# Patient Record
Sex: Male | Born: 1935
Health system: Southern US, Community
[De-identification: ages and names within clinical notes are randomized; demographics above are authoritative.]

## PROBLEM LIST (undated history)

## (undated) DIAGNOSIS — K219 Gastro-esophageal reflux disease without esophagitis: Secondary | ICD-10-CM

## (undated) DIAGNOSIS — I509 Heart failure, unspecified: Secondary | ICD-10-CM

## (undated) DIAGNOSIS — I70219 Atherosclerosis of native arteries of extremities with intermittent claudication, unspecified extremity: Secondary | ICD-10-CM

## (undated) DIAGNOSIS — I236 Thrombosis of atrium, auricular appendage, and ventricle as current complications following acute myocardial infarction: Secondary | ICD-10-CM

## (undated) DIAGNOSIS — I10 Essential (primary) hypertension: Secondary | ICD-10-CM

## (undated) DIAGNOSIS — R0602 Shortness of breath: Secondary | ICD-10-CM

## (undated) DIAGNOSIS — Z95810 Presence of automatic (implantable) cardiac defibrillator: Secondary | ICD-10-CM

## (undated) DIAGNOSIS — I219 Acute myocardial infarction, unspecified: Secondary | ICD-10-CM

## (undated) DIAGNOSIS — J189 Pneumonia, unspecified organism: Secondary | ICD-10-CM

## (undated) DIAGNOSIS — I2510 Atherosclerotic heart disease of native coronary artery without angina pectoris: Secondary | ICD-10-CM

## (undated) DIAGNOSIS — Z5189 Encounter for other specified aftercare: Secondary | ICD-10-CM

## (undated) DIAGNOSIS — N189 Chronic kidney disease, unspecified: Secondary | ICD-10-CM

## (undated) DIAGNOSIS — IMO0001 Reserved for inherently not codable concepts without codable children: Secondary | ICD-10-CM

## (undated) DIAGNOSIS — J449 Chronic obstructive pulmonary disease, unspecified: Secondary | ICD-10-CM

## (undated) DIAGNOSIS — C801 Malignant (primary) neoplasm, unspecified: Secondary | ICD-10-CM

## (undated) DIAGNOSIS — M1990 Unspecified osteoarthritis, unspecified site: Secondary | ICD-10-CM

## (undated) DIAGNOSIS — Z7901 Long term (current) use of anticoagulants: Secondary | ICD-10-CM

## (undated) DIAGNOSIS — C61 Malignant neoplasm of prostate: Secondary | ICD-10-CM

## (undated) DIAGNOSIS — I209 Angina pectoris, unspecified: Secondary | ICD-10-CM

## (undated) DIAGNOSIS — D649 Anemia, unspecified: Secondary | ICD-10-CM

## (undated) HISTORY — PX: CORONARY ANGIOPLASTY: SHX604

## (undated) HISTORY — PX: CARDIAC CATHETERIZATION: SHX172

## (undated) HISTORY — DX: Acute myocardial infarction, unspecified: I21.9

## (undated) HISTORY — DX: Atherosclerosis of native arteries of extremities with intermittent claudication, unspecified extremity: I70.219

---

## 1998-12-26 ENCOUNTER — Encounter: Payer: Self-pay | Admitting: Surgery

## 1998-12-28 ENCOUNTER — Ambulatory Visit (HOSPITAL_COMMUNITY): Admission: RE | Admit: 1998-12-28 | Discharge: 1998-12-28 | Payer: Self-pay | Admitting: Surgery

## 2000-07-09 ENCOUNTER — Encounter: Payer: Self-pay | Admitting: Surgery

## 2000-07-09 ENCOUNTER — Encounter: Admission: RE | Admit: 2000-07-09 | Discharge: 2000-07-09 | Payer: Self-pay | Admitting: Surgery

## 2000-12-11 ENCOUNTER — Encounter (INDEPENDENT_AMBULATORY_CARE_PROVIDER_SITE_OTHER): Payer: Self-pay | Admitting: Specialist

## 2000-12-11 ENCOUNTER — Ambulatory Visit (HOSPITAL_COMMUNITY): Admission: RE | Admit: 2000-12-11 | Discharge: 2000-12-11 | Payer: Self-pay | Admitting: Gastroenterology

## 2001-10-22 ENCOUNTER — Ambulatory Visit (HOSPITAL_BASED_OUTPATIENT_CLINIC_OR_DEPARTMENT_OTHER): Admission: RE | Admit: 2001-10-22 | Discharge: 2001-10-22 | Payer: Self-pay | Admitting: Urology

## 2002-09-06 ENCOUNTER — Ambulatory Visit (HOSPITAL_COMMUNITY): Admission: RE | Admit: 2002-09-06 | Discharge: 2002-09-06 | Payer: Self-pay | Admitting: Family Medicine

## 2002-09-06 ENCOUNTER — Encounter: Payer: Self-pay | Admitting: Family Medicine

## 2003-02-16 ENCOUNTER — Encounter: Payer: Self-pay | Admitting: Surgery

## 2003-02-16 ENCOUNTER — Ambulatory Visit (HOSPITAL_COMMUNITY): Admission: RE | Admit: 2003-02-16 | Discharge: 2003-02-16 | Payer: Self-pay | Admitting: Surgery

## 2003-12-06 ENCOUNTER — Ambulatory Visit (HOSPITAL_COMMUNITY): Admission: RE | Admit: 2003-12-06 | Discharge: 2003-12-06 | Payer: Self-pay | Admitting: Family Medicine

## 2003-12-14 ENCOUNTER — Ambulatory Visit (HOSPITAL_COMMUNITY): Admission: RE | Admit: 2003-12-14 | Discharge: 2003-12-14 | Payer: Self-pay | Admitting: Family Medicine

## 2004-08-10 ENCOUNTER — Emergency Department (HOSPITAL_COMMUNITY): Admission: EM | Admit: 2004-08-10 | Discharge: 2004-08-10 | Payer: Self-pay | Admitting: Emergency Medicine

## 2004-08-15 ENCOUNTER — Ambulatory Visit (HOSPITAL_COMMUNITY): Admission: RE | Admit: 2004-08-15 | Discharge: 2004-08-15 | Payer: Self-pay | Admitting: Urology

## 2004-10-03 ENCOUNTER — Ambulatory Visit (HOSPITAL_COMMUNITY): Admission: RE | Admit: 2004-10-03 | Discharge: 2004-10-03 | Payer: Self-pay | Admitting: Urology

## 2005-07-25 ENCOUNTER — Ambulatory Visit: Payer: Self-pay | Admitting: Cardiology

## 2005-07-25 ENCOUNTER — Inpatient Hospital Stay (HOSPITAL_COMMUNITY): Admission: EM | Admit: 2005-07-25 | Discharge: 2005-07-29 | Payer: Self-pay | Admitting: *Deleted

## 2005-07-28 ENCOUNTER — Ambulatory Visit: Payer: Self-pay | Admitting: Cardiology

## 2005-07-28 ENCOUNTER — Encounter: Payer: Self-pay | Admitting: Cardiology

## 2005-08-01 ENCOUNTER — Ambulatory Visit: Payer: Self-pay | Admitting: Cardiology

## 2005-08-08 ENCOUNTER — Ambulatory Visit: Payer: Self-pay | Admitting: Cardiology

## 2005-09-01 ENCOUNTER — Ambulatory Visit: Payer: Self-pay | Admitting: Cardiology

## 2005-10-16 ENCOUNTER — Ambulatory Visit (HOSPITAL_COMMUNITY): Admission: RE | Admit: 2005-10-16 | Discharge: 2005-10-16 | Payer: Self-pay | Admitting: Family Medicine

## 2005-12-18 ENCOUNTER — Ambulatory Visit: Payer: Self-pay | Admitting: Cardiology

## 2006-07-21 ENCOUNTER — Encounter: Payer: Self-pay | Admitting: Cardiology

## 2006-07-21 ENCOUNTER — Ambulatory Visit: Payer: Self-pay | Admitting: Cardiology

## 2006-08-06 ENCOUNTER — Ambulatory Visit: Payer: Self-pay

## 2006-08-13 ENCOUNTER — Inpatient Hospital Stay (HOSPITAL_BASED_OUTPATIENT_CLINIC_OR_DEPARTMENT_OTHER): Admission: RE | Admit: 2006-08-13 | Discharge: 2006-08-13 | Payer: Self-pay | Admitting: Cardiology

## 2006-08-31 ENCOUNTER — Ambulatory Visit: Payer: Self-pay | Admitting: Cardiology

## 2007-11-29 ENCOUNTER — Ambulatory Visit: Payer: Self-pay | Admitting: Cardiology

## 2009-04-23 ENCOUNTER — Encounter: Payer: Self-pay | Admitting: Cardiology

## 2009-04-23 DIAGNOSIS — R0602 Shortness of breath: Secondary | ICD-10-CM

## 2009-04-23 DIAGNOSIS — I10 Essential (primary) hypertension: Secondary | ICD-10-CM | POA: Insufficient documentation

## 2009-04-23 DIAGNOSIS — E785 Hyperlipidemia, unspecified: Secondary | ICD-10-CM | POA: Insufficient documentation

## 2009-04-23 DIAGNOSIS — I2510 Atherosclerotic heart disease of native coronary artery without angina pectoris: Secondary | ICD-10-CM | POA: Insufficient documentation

## 2009-04-23 HISTORY — DX: Atherosclerotic heart disease of native coronary artery without angina pectoris: I25.10

## 2009-04-24 ENCOUNTER — Ambulatory Visit: Payer: Self-pay | Admitting: Cardiology

## 2009-05-07 ENCOUNTER — Ambulatory Visit: Payer: Self-pay | Admitting: Cardiology

## 2009-05-07 ENCOUNTER — Encounter: Payer: Self-pay | Admitting: Cardiology

## 2009-05-07 ENCOUNTER — Ambulatory Visit: Payer: Self-pay

## 2009-05-08 ENCOUNTER — Ambulatory Visit: Payer: Self-pay | Admitting: Cardiology

## 2009-05-08 DIAGNOSIS — I2589 Other forms of chronic ischemic heart disease: Secondary | ICD-10-CM

## 2009-05-10 LAB — CONVERTED CEMR LAB
BUN: 21 mg/dL (ref 6–23)
CO2: 29 meq/L (ref 19–32)
Calcium: 8.4 mg/dL (ref 8.4–10.5)
Chloride: 109 meq/L (ref 96–112)
Creatinine, Ser: 1.6 mg/dL — ABNORMAL HIGH (ref 0.4–1.5)
GFR calc non Af Amer: 45.37 mL/min (ref >60)
Glucose, Bld: 105 mg/dL — ABNORMAL HIGH (ref 70–99)
Potassium: 4.4 meq/L (ref 3.5–5.1)
Sodium: 143 meq/L (ref 135–145)

## 2009-05-15 ENCOUNTER — Telehealth: Payer: Self-pay | Admitting: Cardiology

## 2009-05-18 ENCOUNTER — Telehealth: Payer: Self-pay | Admitting: Cardiology

## 2010-09-22 LAB — CONVERTED CEMR LAB
BUN: 25 mg/dL — ABNORMAL HIGH (ref 6–23)
CO2: 27 meq/L (ref 19–32)
Calcium: 8.8 mg/dL (ref 8.4–10.5)
Chloride: 108 meq/L (ref 96–112)
Creatinine, Ser: 1.5 mg/dL (ref 0.4–1.5)
GFR calc non Af Amer: 48.88 mL/min (ref >60)
Glucose, Bld: 104 mg/dL — ABNORMAL HIGH (ref 70–99)
Potassium: 4.9 meq/L (ref 3.5–5.1)
Sodium: 140 meq/L (ref 135–145)

## 2011-01-07 NOTE — Assessment & Plan Note (Signed)
[facility] OFFICE NOTE   INNIS, CAMIRE                      MRN:          [id number]  DATE:11/29/2007                            DOB:          June 06, 1935    [name] is here for cardiology follow-up.  He is doing relatively  well.  Unfortunately, he is bothered significantly by shortness of  breath.  He has been evaluated at the [facility].  He will be obtaining  some records for me.  It is my understanding that his cardiac status was  checked and now he is having very careful complete pulmonary evaluation.  I suspect that his pulmonary status is a major issue. He is not having  any chest pain.  He has not had syncope or presyncope. We know that he  has coronary disease.  He had an anterior MI in the past and was treated  with a kissing balloon intervention.  His LV did improve.  Because it  was a bifurcation lesion, it would be optimal to have him on Plavix  indefinitely.   PAST MEDICAL HISTORY:   ALLERGIES:  DEMEROL.   MEDICATIONS:  1. Plavix 75.  2. Metoprolol 50 b.i.d.  3. Aspirin.  4. Cyclobenzaprine.  5. Albuterol,  6. Celebrex.  7. Simvastatin.  8. Lisinopril.  9. Singulair.   OTHER MEDICAL PROBLEMS:  See the list below.   REVIEW OF SYSTEMS:  Other than his shortness of breath, his review of  systems is negative.   PHYSICAL EXAM:  His weight is 197 pounds on our scale which is increased  by 11 pounds since last year.  Because he has had shortness of breath,  he has been less active and he is eating more and he knows that he needs  to lose weight. Blood pressure is 118/84 with a pulse of 74.  The  patient is oriented to person, time and place.  Affect is normal.  HEENT:  Reveals no xanthelasma.  He has normal extraocular motion.  There are no carotid bruits.  There is no jugular venous distention.  LUNGS:  Reveal decreased breath sounds.  There is no respiratory  distress at  rest.  CARDIAC:  Reveals an S1 with an S2.  There are no clicks or significant  murmurs.  ABDOMEN:  Protuberant but soft.  He has no significant peripheral edema.   No labs were done today.   PROBLEMS:  1. Hypertension treated.  2. History of prostate cancer with radioactive seeds approximately 10      years ago.  3. History of nephrolithiasis.  4. Urinary tract infections over time.  5. Arthritis.  6. Status post anterior myocardial infarction.  This occurred in      December 2006. He received stenting of the LAD and PTCA of the      diagonal branch with a kissing balloon technique.  7. Significant pulmonary disease that is being evaluated further at      this time.  8. Lipid abnormalities on medication.   I suspect that his shortness of breath currently is more pulmonary  than  cardiac.  The patient had a bifurcation lesion and I believe it will be  optimal for him to be on Plavix indefinitely unless there is a  contraindication.     Carlena Bjornstad, MD, [facility]  Electronically Signed    JDK/MedQ  DD: 11/29/2007  DT: 11/29/2007  Job #: [id number]   cc:   [facility] Dr.  Gwynneth Albright

## 2011-01-10 NOTE — Op Note (Signed)
[facility]  Patient:    [name], [name] Visit Number: [id number] MRN: [id number]          Service Type: NES Location: [place] Attending Physician:  Paul Dykes Dictated by:   Duane Lope. Parks Neptune, M.D. Proc. Date: 10/22/01 Admit Date:  10/22/2001                             Operative Report  DIAGNOSIS:  Urethral stricture disease.  OPERATIVE PROCEDURE:  Optical urethrotomy and cystourethroscopy.  SURGEON:  Duane Lope. Parks Neptune, M.D.  ANESTHESIA:  General laryngeal airway.  ESTIMATED BLOOD LOSS:  Negligible.  TUBES:  20-French Foley.  COMPLICATIONS:  None.  INDICATION FOR PROCEDURE:  [name] is a very nice 76 year old white male who is status post seed implantation in 1999. His PSAs have been unmeasurable but he has developed some variable flow, terminal hematuria, and straining. Attempt at cystourethroscopy in the office revealed a deep bulbar urethral stricture and he had a history of prior stricture disease. He understands risks, benefits, and alternatives. He would like to proceed with the above procedure.  Of note, he had an ultrasound of the kidneys in the office which revealed kidney stones but no evidence of masses or hydronephrosis.  PROCEDURE IN DETAIL:  The patient was placed in supine position and after proper laryngeal airway anesthesia, was placed in the dorsal lithotomy position and prepped and draped with Betadine in sterile fashion. Cystourethroscopy is performed with an Olympus optical urethrotome. He was noted to have a deep bulbar urethral stricture which was approximately 2 cm in length, most of it was wide caliber but it was quite tight just distal to the external sphincter and utilizing the optical urethrotome, it was incised in the 12 oclock position and opened to viable tissues. The scope was then passed into the bladder. Cystourethroscopy was performed utilizing the 12 and 70 degree lenses. The bladder  was carefully inspected. It was quite smooth walled without significant trabeculation. Efflux of clear urine was noted from the normally placed ureteral orifices bilaterally. Prostatic fossa had slight hypertrophy but really was not obstructed and the area of optical urethrotomy appeared to be dry and widely patent. The optical urethrotome was visually removed. A 20-French Foley catheter was inserted and the urine was drained clear and the patient was taken to the recovery room in stable condition. Dictated by:   Duane Lope. Parks Neptune, M.D. Attending Physician:  Paul Dykes DD:  10/22/01 TD:  10/23/01 Job: [id number]

## 2011-01-10 NOTE — Assessment & Plan Note (Signed)
[facility] OFFICE NOTE   [name], [name]                      MRN:          [id number]  DATE:08/31/2006                            DOB:          Jan 21, 1935    [name] is doing well.  I had seen him over time and then most  recently he had a followup catheterization as part of the followup of  the HORIZONS trial.  That study went very well.  It was done by Dr.  Lia Foyer.  He has had improvement in his left ventricular wall function  with his ejection fraction being now in the 45-50% range.  He had no  evidence of significant restenosis at the stent site.  Originally, he  had had an intervention with stenting only of the parent vessel in a  bifurcation lesion.  This has worked well for him.  We will plan to keep  him on Plavix for a prolonged period of time, if not indefinitely.   He is not having any significant chest pain.  He really does appear to  be motivated.   PAST MEDICAL HISTORY:  Allergies:  DEMEROL.   Medications:  1. Plavix 75.  2. Metoprolol 50 b.i.d.  3. Aspirin.  4. Cyclobenzaprine.  5. Albuterol.  6. Celebrex.  7. Simvastatin 80.  8. Lisinopril.  9. Singulair.   Other medical problems:  See the list on the note of July 21, 2006.   REVIEW OF SYSTEMS:  He feels fine and his review of systems is negative.   PHYSICAL EXAMINATION:  VITAL SIGNS:  Today he weighs 186 pounds.  Blood  pressure is 114/77 with a pulse of 73.  GENERAL:  The patient is oriented to person, time and place.  Affect is  normal.  HEENT:  There is no xanthelasma.  He has normal extraocular motion.  He  has no carotid bruits.  There is no jugular venous distention.  LUNGS:  Clear.  Respiratory effort is not labored.  CARDIAC:  Reveals an S1 with an S2.  There are no clicks or significant  murmurs.  ABDOMEN:  Soft.  He has no masses or bruits.  EXTREMITIES:  There is no peripheral edema.  His catheterization  site is  nicely healed by report.   PROBLEMS:  1. Hypertension, treated.  2. History of prostate cancer with radioactive seeds approximately 8-9      years ago.  3. Nephrolithiasis.  4. History of urinary tract infections over time.  5. Arthritis.  6. Status post anterior myocardial infarction, treated rapidly with a      stent in a kissing-balloon position to the native vessel.  His left      ventricular function has definitely improved.  7. History of cellulitis in his arm during the hospitalization that      resolved.  8. Significant pulmonary disease, being treated.  9. Lipid abnormalities, on medication.   He is doing well.  I will see him back in 1 year.     Mason Bjornstad, MD, [facility]  Electronically Signed    JDK/MedQ  DD: 08/31/2006  DT: 08/31/2006  Job #: [id number]   cc:   [name]. Mason Orleans, DO

## 2011-01-10 NOTE — H&P (Signed)
[name], [name] NO.:  [id number]   MEDICAL RECORD NO.:  [id number]          PATIENT TYPE:  INP   LOCATION:  2855                         FACILITY:  [facility]   PHYSICIAN:  [name], M.D.     DATE OF BIRTH:  1935-08-18   DATE OF ADMISSION:  07/25/2005  DATE OF DISCHARGE:                                HISTORY & PHYSICAL   REASON FOR ADMISSION:  [name] is a 76 year old male, with no prior  cardiac history, who presents to the emergency room via EMS, with an acute  myocardial infarction.   HISTORY OF PRESENT ILLNESS:  The patient felt bad this morning and noted  the onset of pain at approximately 9:30 a.m.  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  After coming home  later this morning, he called his son at around 11:30 a.m. complaining of  severe, sharp mid-sternal chest pain with profuse associated diaphoresis and  radiation down his left arm.  He also reported having difficulty taking in  air and had some mild associated nausea.  The family contacted EMS and he  presented to the emergency room with marked ST elevation in the precordial  leads, as well as ST elevation in lead I and aVL.   The patient was treated with four baby aspirin, intravenous heparin bolus  and 8 mg of morphine.  Arrangements were made to take the patient directly  to the cardiac catheterization laboratory for emergent coronary angiography  and intervention.   ALLERGIES:  CIPROFLOXACIN.   MEDICATIONS PRIOR TO ADMISSION:  1.  Felodipine 10 mg daily.  2.  Singulair one tab daily.  3.  Celebrex p.r.n.   PAST MEDICAL HISTORY:  1.  Hypertension.  2.  History of prostate cancer, status post radioactive seed treatment      approximately eight years ago (at [facility]), recurrent      nephrolithiasis with associated urinary tract infections and arthritis.   SOCIAL HISTORY:  The patient lives in [place] with his wife.  He  works  part-time as a Dealer at [employer].  He has been smoking cigarettes  off and on for approximately seven years.  He drinks alcohol only on rare  occasion.   FAMILY HISTORY:  Mother deceased at age 19, from a fatal myocardial  infarction (first one).  Father deceased at age 41, with no known history of  coronary artery disease.   REVIEW OF SYSTEMS:  The patient reportedly had no recent complaints of chest  discomfort either at rest or with exertion, or any complaints of any recent  development of exertional dyspnea, orthopnea or paroxysmal nocturnal  dyspnea.  His wife, however, noted that he had been wheezing these past few  days.  There was no recent evidence of any overt upper or lower GI bleeding.  He has occasional symptoms of heartburn.  The remaining symptoms are  negative.   PHYSICAL EXAMINATION:  VITAL SIGNS:  Blood pressure 131/78, pulse 50 and  regular on admission, respirations 14, saturation 94% on room air.  GENERAL:  A 76 year old male, lying supine, in no apparent distress, in no  respiratory distress.  HEENT:  Normocephalic and atraumatic.  NECK:  Bilateral carotid pulses without bruits.  No jugular venous  distention.  LUNGS:  Limited exam, but without any obvious crackles or wheezes in the  supine position.  HEART:  A regular rate and rhythm (S1 and S2), no significant murmurs.  No  gallops.  ABDOMEN:  Soft, nontender.  EXTREMITIES:  Palpable distal pulses with no significant edema.  NEUROLOGIC:  No focal deficits.   Electrocardiogram:  Sinus bradycardia at 53 b.p.m. with marked ST elevation  in lead V1-V5, a 1-2 mm ST elevation in lead I and aVL, with ST flattening  in the inferior leads.   LABORATORY DATA:  Pending.   IMPRESSION:  1.  Acute anterolateral myocardial infarction.  2.  History of hypertension.  3.  Longstanding tobacco.  4.  History of prostate cancer      1.  Status post radioactive seed treatment.   PLAN:  Proceed directly  to the cardiac catheterization laboratory for an  emergent coronary angiography and percutaneous intervention.      [name], P.A. [facility]    ______________________________  [name], M.D.    GS/MEDQ  D:  07/25/2005  T:  07/25/2005  Job:  [id number]   cc:   [name], M.D.  Fax: [phone]

## 2011-01-10 NOTE — Cardiovascular Report (Signed)
[name], [name]               ACCOUNT NO.:  [id number]   MEDICAL RECORD NO.:  [id number]          PATIENT TYPE:  OIB   LOCATION:  1962                         FACILITY:  [facility]   PHYSICIAN:  Loretha Brasil. Lia Foyer, MD, FACCDATE OF BIRTH:  11-24-1934   DATE OF PROCEDURE:  08/13/2006  DATE OF DISCHARGE:                            CARDIAC CATHETERIZATION   INDICATIONS:  [name] is a delightful 76 year old gentleman followed  by Dr. Ron [name].  He previously presented with an anterior wall myocardial  infarction and underwent stenting in the HORIZONS trial.  This required  stenting of the parent vessel with kissing balloon angioplasty for  rescue of the diagonal.  He had a nice angiographic result at the  completion of the procedure.  He clinically has done well.  This  represents a 1-year follow-up study.   DESCRIPTION OF PROCEDURE:  The patient was brought to the  catheterization laboratory and prepped and draped in the usual fashion  after informed consent.  Through an anterior puncture, the right femoral  artery was entered.  A femoral angiogram was obtained for potential  closure, and there is a small side branch which comes off anteriorly  right at the sheath insertion site.  Plans for Angio-Seal closure were  then abandoned.  The patient was heparinized with 50 units/kg.  ACT was  checked.  Views of the right coronary artery were obtained.  We used a  JL-3.5 guiding catheter subsequently and multiple views of the target  artery were also obtained.  This was also done after intracoronary  nitroglycerin administration.  With an adequate ACT, the lesion was then  wired with a Prowater wire.  Intravascular ultrasound was then  attempted.  Despite multiple breaths, we were unable to pass the  intravascular ultrasound catheter past the proximal portion of the  stent.  With this, I elected to change the wire and the ultrasound  catheter was removed.  The Prowater wire was exchanged for a  Luge wire,  which was manipulated again down the vessel in multiple views.  Following this, we attempted again to cross with the intravascular  ultrasound with various breathing maneuvers and again were unable to  pass the proximal portion of the stent.  The wire was then brought back,  placed back down the vessel, and an attempt was made to cross down into  the distal vessel.  The ultrasound catheter would not pass the proximal  portion of the stent, and therefore both the wire and the ultrasound  catheter were removed.  Follow-up angiograms were obtained.  There was  an excellent angiographic appearance.  No attempt at femoral artery  closure was tried due to what appeared to be a small anterior side  branch at the site of entry.  He was taken to the holding area in  satisfactory clinical condition for plans for sheath removal when the  ACT was 150.   HEMODYNAMIC DATA.:  1. Central aortic pressure 125/74, mean 96.  2. LV 118/70.   ANGIOGRAPHIC DATA.:  1. Ventriculography was done in the RAO projection.  Estimate of the  ejection fraction would be in the 45-50% range.  Importantly,      regional wall motion in the anterolateral and apical segments was      significantly improved from the infarct study.  The inferior base      and anterior base were not as hyperdynamic as on the acute study,      as would be expected.  2. The right coronary artery is a moderately large-caliber vessel.      There is not significant calcification noted.  There is a      shepherd's crook takeoff and in the very first shepherd's crook is      a plaque of about 30% that actually appears slightly improved from      the acute study.  There is an area of segmental plaquing in the mid      segment of the right coronary artery of about 30%, and there is      also some mild tapered narrowing involving the ostium of the      posterior descending branch.  There are three small posterolateral      branches,  which all appear to be free of critical disease.  3. The left main is a large-caliber vessel free of critical disease.  4. The LAD courses to the apex.  The stented area in the mid vessel      demonstrates no evidence of any significant restenosis and is      widely patent.  Flow into the distal LAD is excellent.  The distal      LAD is about a 2.25 mm artery supplying a couple of septals.  There      is a small diagonal branch which comes out proximal to the stent      placement site,and then a large diagonal that comes off in the      midportion of the stented site.  The diagonal itself appears to be      widely patent without significant renarrowing.  There is perhaps      30% narrowing.  The diagonal is moderately large with three small      side branches.  5. The circumflex provides an insignificant ramus vessel and then a      large marginal which bifurcates over the distal lateral wall.      There is minor luminal irregularity but no significant high-grade      focal stenosis.   CONCLUSIONS:  1. Improved regional wall motion in the anterolateral segment.  2. Continued patency of the right coronary artery.  3. No evidence of significant restenosis at the stent site, widely      patent into the distal left anterior descending artery.   DISPOSITION:  The patient is stable.  We will arrange follow-up with Dr.  Daryel [name].  Importantly, the patient has had the stent placed in the  setting of acute myocardial infarction as part of the HORIZONS clinical  trial.  He also had kissing balloon intervention with stenting only of  the parent vessel.  Given the acute nature in the bifurcation lesion, my  inclination at the present time would be to continue to follow the  patient on Plavix with close follow-up for signs of bleeding.      Loretha Brasil. Lia Foyer, MD, [facility]  Electronically Signed    TDS/MEDQ  D:  08/13/2006  T:  08/13/2006  Job:  [id number]   cc:   Mason Borg, MD  [name].  Mason Parker, MD, [facility]

## 2011-01-10 NOTE — Discharge Summary (Signed)
[name], [name] NO.:  [id number]   MEDICAL RECORD NO.:  [id number]          PATIENT TYPE:  INP   LOCATION:  [id number]                         FACILITY:  [facility]   PHYSICIAN:  Dola Argyle, M.D.     DATE OF BIRTH:  [birth year]   DATE OF ADMISSION:  07/25/2005  DATE OF DISCHARGE:  07/29/2005                           DISCHARGE SUMMARY - REFERRING   HISTORY OF PRESENT ILLNESS:  [name] is a 76 year old male who presents  to the emergency room via EMS secondary to myocardial infarction.  The  patient stated that on the morning of admission he developed chest  discomfort approximately at 9:30 a.m.  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  However, he called his son around  11:30 and continued to complain of sharp severe midsternal chest discomfort  associated with diaphoresis and radiation to his left arm, shortness of  breath and mild nausea.  EMS was contacted and he was transported to [facility] emergency room where EKG's showed ST segment elevation in leads one and  aVL.   PAST MEDICAL HISTORY:  1.  Hypertension.  2.  Prostate cancer with radioactive seed treatment approximately eight      years ago.  3.  Recurrent nephrolithiasis.  4.  Repeated urinary tract infections.  5.  Arthritis.  6.  Tobacco use for approximately 47 years.   LABORATORY DATA:  Chest x-ray on July 26, 2005 shows no acute pulmonary  processes, stable right lung granuloma.  EKG's on admission showed ST  segment elevations V1 through V6 with ST segment depression in leads 3.  He  also had a ST segment elevation in one and aVL.  Subsequent EKG's showed  sinus rhythm, occasional PVCs, evolving anterior myocardial infarction.   Admission H&H was 14.7 and 41.9, normal indices, platelets 270, WBC 12.6.  Subsequent hematologies were unremarkable.  Admission PTT was 24, PT 12.5,  sodium 136, potassium 49, BUN 16, creatinine 1.2, glucose 118.  LFT's were  unremarkable except for his AST was slightly elevated at 44.  Subsequent  chemistries did show a potassium of 3.4 on the second, however, on the third  his potassium was 3.9.  On the third his BUN and creatinine was 10 and 1.2.  Hemoglobin A1C was within normal limits at 5.5.  Initial CK-MB was 185 and  10.1 with a relative index 5.5, troponin 0.39.  The second CK peaked at 472  with MB of 406.7, relative index of 10 and troponin of greater than 100.  Subsequent enzymes and troponin's were declining.  BNP on the second was  221.4.  Fasting lipids on the second showed a total cholesterol of 194,  triglycerides 126, HDL 33, LDL 136.  TSH was 2.335.  C-reactive protein was  less than 0.01.   HOSPITAL COURSE:  [name] proceeded emergently to the catheterization  lab.  This was performed by Dr. Lia Foyer.  According to Dr. Maren Beach note  arrival to inflation was 17 minutes.  He underwent PTCA and stenting of the  LAD with a study stent and  angioplasty of the diagonal with a kissing  balloon.  EF was 40-45% with a posterior wall MI.  Post catheterization at  sheath removal catherization site was intact.  EKG's continued to show ST  segment elevation.  He continued to have some nausea overnight and with his  elevated LVEDP on cath he received one dose of Lasix.  Overnight he remained  afebrile without any further episodes of chest discomfort and his nausea had  resolved.  Cardiac rehab and tobacco cessation consults were performed.  Medications were adjusted.  Echocardiogram was performed on the fourth and  this revealed an EF of 35-45%.  Akinesis of the anterior wall and periapical  wall.  There was an aneurysmal deformity in the mid distal inferior wall,  mild aortic valve calcifications and the possibility of cardiac embolism  could not be ruled out by this study.  On July 29, 2005 the patient was  ambulating without difficulty.  Dr. Radene Ou reviewed the echocardiogram.  Discharge planning  was begun.  Prior to discharge Dr. Radene Ou called myself  with specific instructions in regards to his discharge i.e., he should be  discharged home without Combivent and to continue a beta selective agent  given his pulmonary status.   DISCHARGE DIAGNOSES:  1.  Acute anterior lateral myocardial infarction status post stenting of the      LAD and angioplasty of the diagonal one as previously described.  2.  Hyperlipidemia with initiation of Lipitor.  3.  Chronic obstructive pulmonary disease with tobacco use.  4.  Hypokalemia.  5.  History of hypertension.  6.  Prostate cancer with recent seed placement.   DISPOSITION:  [name] is discharged home.  He is asked to maintain a low  salt, low fat  cholesterol diet.  His activities in regards to work,  driving, lifting, sexual activity or heavy exertion are restricted for two  weeks.  Wound care per supplemental discharge sheet.  He was asked to avoid  tobacco products and to discontinue smoking.  To bring all medications to  all appointments.   DISCHARGE MEDICATIONS:  He was given permission to continue his Singular and  his Celebrex.  His new medications include Plavix 75 mg daily, aspirin 325  mg daily, nitroglycerin 0.4 mg p.r.n., Lipitor 80 mg q.h.s., Lopressor 25 mg  b.i.d., Keflex 250 mg t.i.d. for five days (IV infiltration) and Altace 2.5  mg daily.   FOLLOW UP:  He will followup with Dr. Radene Ou in the [facility] office on August 01, 2005 at 2 p.m.  At that time he will reassess his pulmonary status.  He will  also need arrangements for blood work in approximately 6-8 weeks in regards  to his fasting lipid profile and LFT's since Lipitor was initiated.      Sharyl Nimrod, P.A. [facility]    ______________________________  Dola Argyle, M.D.    EW/MEDQ  D:  07/29/2005  T:  07/29/2005  Job:  [id number]   cc:   Leonides Grills, M.D.  Fax: [phone]

## 2011-01-10 NOTE — H&P (Signed)
[place]   HORACE, ROBARDS                      MRN:          [id number]  DATE:07/21/2006                            DOB:          16-Aug-1935    This is a patient of Carlena Bjornstad, MD, [facility].  He is here for a 12-  month follow-up catheterization and Horizon study.   CHIEF COMPLAINT:  Follow-up catheterization for Horizon.   HISTORY OF PRESENT ILLNESS:  This is a very pleasant 76 year old married  white male patient of Dr. Dola Argyle, who has a history of coronary  artery disease status post anterior wall MI, treated with stenting and  kissing balloon angioplasty of the LAD in December of 2006.  Ejection  fraction at that time was 40-45%, 50% circumflex marginal, and 70%  marginal branch, 40-50% proximal RCA and PDA.  Follow-up two-dimensional  echocardiogram after his catheterization showed an ejection fraction of  35-45%.   Overall, the patient has done quite well from a cardiac standpoint.  He  denies any chest pain, palpitations, orthopnea, paroxysmal nocturnal  dyspnea.  He does have some breathing problems related to his pulmonary  disease, but did quit smoking after his MI.   ALLERGIES:  DEMEROL.   CURRENT MEDICATIONS:  1. Albuterol nebulizer three times a day p.r.n.  2. Metoprolol 50 mg b.i.d.  3. Plavix 75 mg daily.  4. Lisinopril 10 mg 1/2 b.i.d.  5. Aspirin 325 mg daily.  6. Singulair 10 mg daily.  7. NitroQuick 0.4 mg p.r.n.  8. Omeprazole 20 mg daily.  9. Simvastatin 80 mg daily.  10.Celebrex 200 mg b.i.d.  11.Hydrocodone p.r.n.  12.Carisoprodol 3.5 t.i.d.  13.Combivent inhaler p.r.n.   PAST MEDICAL HISTORY:  Significant for prostate cancer treated with  radioactive seeds 8 years ago, history of nephrolithiasis, history of  recurrent urinary tract infections over time, arthritis, 47-year-pack  history of smoking but he quit, history of cellulitis of his arm  during  hospitalization, history of COPD, hyperlipidemia.   SOCIAL HISTORY:  He is married and has five children, five  grandchildren.  He quit smoking after his MI with 47-pack-year history.   REVIEW OF SYSTEMS:  Negative for dizziness or presyncopal signs or  symptoms, dyspepsia, dysphagia, nausea and vomiting, change in bowels,  or melena.  He does have a chronic runny nose.  Otherwise review of  systems is negative.   PHYSICAL EXAMINATION:  GENERAL:  This is a very pleasant 76 year old  white male in no acute distress.  VITAL SIGNS:  Blood pressure 130/80, pulse 70, weight 183.  HEENT:  Head is normocephalic without sign of trauma.  Extraocular  movements are intact.  Pupils equal, round, and reactive to light and  accommodation.  Nasal mucosa is moist.  Throat is without erythema or  exudate.  NECK:  Without JVD or sharp bruit.  Thyroid enlargement.  LUNGS:  Decreased breath sounds, but clear anterior, posterior, and  lateral.  HEART:  Regular rate and rhythm at 70 beats per minute.  Normal S1 and  S2.  No significant murmur,  bruit, rub, thrill, or heave noted.  ABDOMEN:  Soft without organomegaly, masses, lesions, or abnormal  tenderness.  EXTREMITIES:  Without cyanosis, clubbing, or edema.  He has good distal  pulses.   EKG; normal sinus rhythm with old anterior septal infarct and T wave  inversion anteriorly, unchanged from prior tracing.   IMPRESSION:  1. 74-month Horizon study, follow-up catheterization.  2. History of coronary artery disease, status post anterior wall      myocardial infarction treated with stenting and kissing balloon      procedure of the left anterior descending in December of 2006 with      residual disease as described above.  3. Left ventricular dysfunction with ejection fraction 35-45% on      follow-up two-dimensional echocardiogram.  4. History of hypertension.  5. History of prostate cancer, status post radioactive seeds 8 years       ago.  6. History of nephrolithiasis.  7. History of recurrent urinary tract infections.  8. History of arthritis.  9. Pulmonary; chronic obstructive pulmonary disease with 47-pack-year      history, quit smoking in December of 2006.  10.Hyperlipidemia.   PLAN:  The patient will undergo cardiac catheterization as a follow-up  of the Horizon study.  Dr. Ron Parker did speak of repeating two-dimensional  echocardiogram to reassess his left ventricular  function, but with him having a catheterization, we will hold off on  this at this time and he will see Dr. Ron Parker back in January.      Ermalinda Barrios, PA-C  Electronically Signed      Marijo Conception. Verl Blalock, MD, [facility]  Electronically Signed   ML/MedQ  DD: 07/21/2006  DT: 07/21/2006  Job #: [id number]

## 2011-01-10 NOTE — Cardiovascular Report (Signed)
[name], [name]               ACCOUNT NO.:  [id number]   MEDICAL RECORD NO.:  [id number]          PATIENT TYPE:  INP   LOCATION:  2855                         FACILITY:  [facility]   PHYSICIAN:  Loretha Brasil. Lia Foyer, M.D. [facility] OF BIRTH:  1934-10-28   DATE OF PROCEDURE:  DATE OF DISCHARGE:                              CARDIAC CATHETERIZATION   Audio too short to transcribe (less than 5 seconds)      Loretha Brasil. Lia Foyer, M.D. [facility]     TDS/MEDQ  D:  07/25/2005  T:  07/25/2005  Job:  [id number]

## 2011-01-10 NOTE — Procedures (Signed)
[facility]  Patient:    [name], [name]                      MRN: [id number] Proc. Date: 12/11/00 Adm. Date:  [id number] Disc. Date: [id number] Attending:  Rafael Bihari CC:         [name], M.D.   Procedure Report  PROCEDURE:  Colonoscopy.  INDICATION FOR PROCEDURE:  Chronic diarrhea with multiple loose stools with trace heme positive stool per Dr. Lucia Gaskins.  He was also given atropine 0.2 mg IV during the procedure due to bradycardia with a pulse rate down as low as 32.  DESCRIPTION OF PROCEDURE:  The patient was placed in the left lateral decubitus position then placed on the pulse monitor with continuous low flow oxygen delivered by nasal cannula. He was sedated with 80 mg IV Demerol and 8 mg IV Versed.  The Olympus video colonoscope was inserted into the rectum and advanced to the cecum, confirmed by transillumination at McBurneys point and visualization of the ileocecal valve and appendiceal orifice. The prep was suboptimal with a significant amount of retained nontransparent liquid and some semisolid stool which could not be adequately lavage away thus I could not rule out lesions less than 1 cm in all areas. Otherwise the cecum, ascending and transverse colon appeared normal with no masses, polyps, diverticula or other mucosal abnormalities. Within the descending colon, there was seen an 8 mm sessile polyp that was fulgurated by hot biopsy. Within the sigmoid colon, there were several scattered diverticula, otherwise the sigmoid mucosa and rectal mucosa appeared normal. Biopsies taken to rule out collagenous colitis. The scope was then withdrawn and the patient returned to the recovery room in stable condition. The patient tolerated the procedure well and there were no immediate complications.  IMPRESSION: 1. Descending colon polyp. 2. Sigmoid diverticulosis.  PLAN:  Will await biopsies to rule out collagenous colitis and await  histology on the polyp. If no cause of diarrhea suggested by biopsies will try a course of Questran empirically until he follows up with me in the office. DD:  12/11/00 TD:  12/11/00 Job: [id number]

## 2011-01-10 NOTE — Letter (Signed)
October 15, 2006    [place].  [place], [place]   RE:  [name], [name]  MRN:  [id number]  /  DOB:  [birth year]   To Whom It May Concern:   [name] has coronary artery disease and is followed carefully  by me.  The patient had an intervention after a myocardial infarction  with a kissing balloon procedure.  He received a stent to the parent  vessel at the bifurcation.  This type of lesion needs to be protected  long term with Plavix therapy.   The purpose of this letter is to document that Mr. Shadrach Ghafoor clearly  and definitely needs to be on Plavix on an ongoing basis.  I strongly  [facility], as this is  definitely needed in his case.   Please do not hesitate to contact me if there are any questions.    Sincerely,      Carlena Bjornstad, MD, [facility]  Electronically Signed    JDK/MedQ  DD: 10/15/2006  DT: 10/15/2006  Job #: [id number]

## 2011-01-10 NOTE — Op Note (Signed)
NAME:  [name], [name]                         ACCOUNT NO.:  [id number]   MEDICAL RECORD NO.:  [id number]                   PATIENT TYPE:  AMB   LOCATION:  ENDO                                 FACILITY:  [facility]   PHYSICIAN:  [name], M.D.               DATE OF BIRTH:  03/13/1935   DATE OF PROCEDURE:  DATE OF DISCHARGE:                                 OPERATIVE REPORT   PREOPERATIVE DIAGNOSIS:  Change in bowel habits with worsening constipation  and questionable weight loss.   POSTOPERATIVE DIAGNOSIS:  Significant sigmoid diverticulosis with narrowing  of the sigmoid colon.   OPERATION/PROCEDURE:  Attempted colonoscopy with advancement of colonoscope  to 60 cm.   SURGEON:  [name], M.D.   ANESTHESIA:  65 mg of Demerol, 4 mg Versed.   COMPLICATIONS:  None.   DESCRIPTION OF PROCEDURE:  The patient is a 76 year old white male who has  had a change in bowel habits and a questionable weight loss though with our  scales, we have not seen much of a weight loss since November 2001.  Anyway,  he has had difficulty with bowel habits for about five years.  He has  anywhere from 3-15 bowel movements a day and for this reason is undergoing a  colonoscopy.   The patient was placed in the left lateral decubitus position with an IV in  his right arm.  He has nasal O2, blood pressure cuff, pulse oximetry and EKG  machine monitoring.  He was given 4 mg of Versed, 50 mg of Demerol at the  initiation of the procedure.  I used an adult scope at first, then switched  to a pediatric scope.  When I advanced the scope up to about 50-55 cm, I ran  into some significant diverticulosis with some relative narrowing of his  sigmoid colon.  I was unable to advance the scope beyond this point.  When  advancing this, he had pain and discomfort.  He became bradycardic,  diaphoretic, and hypotensive with a systolic pressure of about 90.  I tried  again with the adult scope.  With the pediatric  scope, we gave him  additional Demerol and none of these maneuvers seemed to make  where I can  safely advance the scope.  Really I did not have any obvious obstruction,  though I did have a significant narrowing of the sigmoid colon.  So I saw  significant sigmoid diverticulosis and no evidence of mucosal lesion.  Withdrew the scope back into the rectum which was unremarkable.  Because I  did get an  incomplete colonoscopy, really did just a fairly long sigmoidoscopy, I feel  he is best having a barium enema.  I have spoken to Dr. Lambert Keto and we  will complete an air contrast barium enema today while his bowels are  prepped and go from there.  [name], M.D.    DHN/MEDQ  D:  02/16/2003  T:  02/16/2003  Job:  [id number]   cc:   Jori Moll L. Parks Neptune, M.D.  [place]. [address] [place]  [place] [address]  Fax: [phone]

## 2011-01-10 NOTE — Cardiovascular Report (Signed)
NAMEHARDING, MCGOURTY               ACCOUNT NO.:  [id number]   MEDICAL RECORD NO.:  [id number]          PATIENT TYPE:  INP   LOCATION:  2912                         FACILITY:  [facility]   PHYSICIAN:  Loretha Brasil. Lia Foyer, M.D. [facility] OF BIRTH:  03-Jul-1935   DATE OF PROCEDURE:  07/25/2005  DATE OF DISCHARGE:                              CARDIAC CATHETERIZATION   INDICATIONS:  Mr. Hebdon is a 76 year old gentleman who presents with an  anterior wall myocardial infarction. He was seen by Dr. Ron Parker in the  emergency room and consented to the Horizons trial. He was brought to the  catheterization laboratory after evaluation in the emergency room.   PROCEDURES:  1.  Left heart catheterization.  2.  Selective coronary arteriography.  3.  Selective left ventriculography.  4.  PTCA and stenting of the left anterior descending artery with PTCA of      the diagonal branch and the kissing balloon technique.  5.  Intravascular ultrasound.   DESCRIPTION OF PROCEDURE:  The patient was brought to the catheterization  laboratory, prepped and draped in the usual fashion. Through an anterior  puncture, the right femoral artery was entered. EKG was consistent with ST  elevation in the anterior leads. Through an anterior puncture, the femoral  artery was entered. A 6-French sheath was placed. ACT was checked and found  to be 231 seconds. The patient was randomized in the Horizons trial to  bivalirudin. bivalirudin was obtained from the laboratory. We initially used  a right coronary catheter and this was followed by a JL-3.5 guide. We  engaged the left main. The LAD was occluded. Repeat ACT was approximately  300 seconds. Then 600 milligrams of oral clopidogrel was administered. A  high-torque floppy was taken down the LAD and the vessel dilated using a 2-0  15 Maverick balloon. With this reperfusion was reestablished. Arrival time  to inflation time was 17 minutes. Following this, the patient was noted to  have what appears to be a bifurcational disease. A 2.5 x 24 study stent was  placed in the left anterior descending artery and taken up to approximately  14-15 atmospheres. Following this, a traverse wire was taken down and put  into the diagonal. After this, we took a 2.75 x 20 Quantum Maverick balloon  into the stent for postdilatation. Prior post dilated, however, we put a  2.25 x 15 Maverick in the side branch because of its clear-cut compromise of  the side branch. The stent was then post dilated up to 16 atmospheres using  the noncompliant balloon. Following this, we went ahead and kissed the LAD  diagonal bifurcation using the balloons as described above. Following this,  there was marked improvement in appearance of the artery. There was a  minimal filling defect noted in only the RAO view, but not in the LAO view  and other views as well. The patient had a postprocedure intravascular  ultrasound and no evidence of intraluminal thrombus was identified by this.  There was expansion of the stent at the diagonal site which was appropriate.  The stent was well placed by ultrasound.  Dr. Olevia Perches and I then reviewed the  films and I decided against any repeat dilatation. The patient had marked  improvement in symptoms with resolution of chest pain. Following this, the  femoral sheath was sewn into place and he was taken to the holding area in  satisfactory clinical condition.   HEMODYNAMIC DATA:  1.  Central aortic pressure 147/87, mean 113.  2.  Left ventricular pressure 128/25.  3.  No gradient on pullback across aortic valve.   ANGIOGRAPHIC DATA:  1.  Ventriculography was done in the RAO projection. Mid, distal,      anterolateral wall and apex and distal inferior wall were all      hypokinetic. At the time of this dictation, ejection fraction had not      calculated but we estimated it in the range of 40-45%.  2.  The circumflex provides a marginal which bifurcates. The superior       portion of bifurcation has about 50% segmental plaque and the inferior      about 70%. This was a moderately large caliber distribution.  3.  The right coronary artery has about 40-50% proximal narrowing followed      by about 40-50% area of narrowing of proximal portion of the PDA. The      right is a relatively large vessel that includes posterolateral system      as well.  4.  The left anterior descending artery was totally occluded with TIMI 0      flow beyond the origin of the septal and diagonal. Following      reperfusion, there was evidence of a ruptured plaque at the LAD diagonal      bifurcation. The diagonal itself was reduced from about 80% to 30% with      an excellent TIMI III flow. The LAD proper was reduced from 100% to 0%      and there was an excellent angiographic appearance, although in the RAO      projection there was a small area of what appears to be perhaps tissue      perfusion. This was not seen on the ultrasound and importantly other      views of the LAD did not demonstrate a significant filling defect. As      mentioned, Dr. Olevia Perches and I reviewed this and felt that no further      dilatation was warranted at this site given the ultrasound findings.   CONCLUSIONS:  Acute anterior wall myocardial infarction treated with  stenting and kissing balloon angioplasty as described above.   DISPOSITION:  The patient will be treated with aspirin, Plavix, beta  blockade, ACE inhibition and Statin therapy. He will need close monitoring  and follow up with Dr. Ron Parker.      Loretha Brasil. Lia Foyer, M.D. [facility]  Electronically Signed     TDS/MEDQ  D:  07/25/2005  T:  07/26/2005  Job:  [id number]   cc:   Vern Claude. Prudence Davidson, M.D.  Fax: [phone]   Dola Argyle, M.D.  [address] [place] [address]   CV Laboratory   Patient's medical record   Biagio Borg, M.D. [facility]  [address]  [place] [address]

## 2011-06-21 ENCOUNTER — Inpatient Hospital Stay (HOSPITAL_COMMUNITY)
Admission: EM | Admit: 2011-06-21 | Discharge: 2011-07-04 | DRG: 246 | Disposition: A | Discharge status: Home Health | Payer: Medicare Other | Source: Ambulatory Visit | Attending: Cardiovascular Disease | Admitting: Cardiovascular Disease

## 2011-06-21 DIAGNOSIS — I240 Acute coronary thrombosis not resulting in myocardial infarction: Secondary | ICD-10-CM

## 2011-06-21 DIAGNOSIS — Z7982 Long term (current) use of aspirin: Secondary | ICD-10-CM

## 2011-06-21 DIAGNOSIS — I2510 Atherosclerotic heart disease of native coronary artery without angina pectoris: Secondary | ICD-10-CM | POA: Diagnosis present

## 2011-06-21 DIAGNOSIS — I129 Hypertensive chronic kidney disease with stage 1 through stage 4 chronic kidney disease, or unspecified chronic kidney disease: Secondary | ICD-10-CM | POA: Diagnosis present

## 2011-06-21 DIAGNOSIS — I238 Other current complications following acute myocardial infarction: Secondary | ICD-10-CM | POA: Diagnosis not present

## 2011-06-21 DIAGNOSIS — D62 Acute posthemorrhagic anemia: Secondary | ICD-10-CM | POA: Diagnosis not present

## 2011-06-21 DIAGNOSIS — R040 Epistaxis: Secondary | ICD-10-CM | POA: Diagnosis not present

## 2011-06-21 DIAGNOSIS — C61 Malignant neoplasm of prostate: Secondary | ICD-10-CM | POA: Diagnosis present

## 2011-06-21 DIAGNOSIS — Z8249 Family history of ischemic heart disease and other diseases of the circulatory system: Secondary | ICD-10-CM

## 2011-06-21 DIAGNOSIS — I10 Essential (primary) hypertension: Secondary | ICD-10-CM | POA: Insufficient documentation

## 2011-06-21 DIAGNOSIS — I2109 ST elevation (STEMI) myocardial infarction involving other coronary artery of anterior wall: Principal | ICD-10-CM | POA: Diagnosis present

## 2011-06-21 DIAGNOSIS — R32 Unspecified urinary incontinence: Secondary | ICD-10-CM | POA: Diagnosis present

## 2011-06-21 DIAGNOSIS — Z8546 Personal history of malignant neoplasm of prostate: Secondary | ICD-10-CM

## 2011-06-21 DIAGNOSIS — J449 Chronic obstructive pulmonary disease, unspecified: Secondary | ICD-10-CM | POA: Diagnosis present

## 2011-06-21 DIAGNOSIS — Z7902 Long term (current) use of antithrombotics/antiplatelets: Secondary | ICD-10-CM

## 2011-06-21 DIAGNOSIS — I959 Hypotension, unspecified: Secondary | ICD-10-CM | POA: Diagnosis present

## 2011-06-21 DIAGNOSIS — S301XXA Contusion of abdominal wall, initial encounter: Secondary | ICD-10-CM | POA: Diagnosis not present

## 2011-06-21 DIAGNOSIS — I2589 Other forms of chronic ischemic heart disease: Secondary | ICD-10-CM | POA: Insufficient documentation

## 2011-06-21 DIAGNOSIS — I509 Heart failure, unspecified: Secondary | ICD-10-CM | POA: Diagnosis present

## 2011-06-21 DIAGNOSIS — E785 Hyperlipidemia, unspecified: Secondary | ICD-10-CM | POA: Diagnosis present

## 2011-06-21 DIAGNOSIS — R000 Tachycardia, unspecified: Secondary | ICD-10-CM | POA: Diagnosis present

## 2011-06-21 DIAGNOSIS — Z87442 Personal history of urinary calculi: Secondary | ICD-10-CM

## 2011-06-21 DIAGNOSIS — I252 Old myocardial infarction: Secondary | ICD-10-CM

## 2011-06-21 DIAGNOSIS — I2102 ST elevation (STEMI) myocardial infarction involving left anterior descending coronary artery: Secondary | ICD-10-CM | POA: Diagnosis present

## 2011-06-21 DIAGNOSIS — N183 Chronic kidney disease, stage 3 unspecified: Secondary | ICD-10-CM | POA: Diagnosis present

## 2011-06-21 DIAGNOSIS — R0602 Shortness of breath: Secondary | ICD-10-CM | POA: Insufficient documentation

## 2011-06-21 DIAGNOSIS — Z833 Family history of diabetes mellitus: Secondary | ICD-10-CM

## 2011-06-21 DIAGNOSIS — J4489 Other specified chronic obstructive pulmonary disease: Secondary | ICD-10-CM | POA: Diagnosis present

## 2011-06-21 DIAGNOSIS — I5023 Acute on chronic systolic (congestive) heart failure: Secondary | ICD-10-CM | POA: Diagnosis present

## 2011-06-21 HISTORY — PX: CORONARY STENT PLACEMENT: SHX1402

## 2011-06-21 LAB — CBC
HCT: 40.3 % (ref 39.0–52.0)
Hemoglobin: 13.8 g/dL (ref 13.0–17.0)
MCH: 32.3 pg (ref 26.0–34.0)
MCHC: 34.2 g/dL (ref 30.0–36.0)
MCV: 94.4 fL (ref 78.0–100.0)
RBC: 4.27 MIL/uL (ref 4.22–5.81)
RDW: 13.1 % (ref 11.5–15.5)
WBC: 11.1 10*3/uL — ABNORMAL HIGH (ref 4.0–10.5)

## 2011-06-21 LAB — LIPID PANEL
Cholesterol: 134 mg/dL (ref 0–200)
HDL: 34 mg/dL — ABNORMAL LOW (ref >39)
LDL Cholesterol: 67 mg/dL (ref 0–99)
Triglycerides: 164 mg/dL — ABNORMAL HIGH (ref <150)
VLDL: 33 mg/dL (ref 0–40)

## 2011-06-21 LAB — PROTIME-INR
INR: 1.08 (ref 0.00–1.49)
Prothrombin Time: 14.2 seconds (ref 11.6–15.2)

## 2011-06-21 LAB — COMPREHENSIVE METABOLIC PANEL
ALT: 28 U/L (ref 0–53)
AST: 36 U/L (ref 0–37)
Albumin: 3.4 g/dL — ABNORMAL LOW (ref 3.5–5.2)
Alkaline Phosphatase: 44 U/L (ref 39–117)
BUN: 20 mg/dL (ref 6–23)
CO2: 23 mEq/L (ref 19–32)
Calcium: 8.3 mg/dL — ABNORMAL LOW (ref 8.4–10.5)
Creatinine, Ser: 1.49 mg/dL — ABNORMAL HIGH (ref 0.50–1.35)
GFR calc Af Amer: 39 mL/min — ABNORMAL LOW (ref >90)
GFR calc non Af Amer: 33 mL/min — ABNORMAL LOW (ref >90)
Glucose, Bld: 147 mg/dL — ABNORMAL HIGH (ref 70–99)
Sodium: 138 mEq/L (ref 135–145)
Total Bilirubin: 0.3 mg/dL (ref 0.3–1.2)

## 2011-06-21 LAB — CK TOTAL AND CKMB (NOT AT ARMC)
Relative Index: 1.9 (ref 0.0–2.5)
Total CK: 180 U/L (ref 7–232)

## 2011-06-21 LAB — DIFFERENTIAL
Basophils Absolute: 0.1 10*3/uL (ref 0.0–0.1)
Basophils Relative: 1 % (ref 0–1)
Eosinophils Relative: 4 % (ref 0–5)
Lymphocytes Relative: 26 % (ref 12–46)
Lymphs Abs: 2.8 10*3/uL (ref 0.7–4.0)
Monocytes Absolute: 1.2 10*3/uL — ABNORMAL HIGH (ref 0.1–1.0)
Monocytes Relative: 11 % (ref 3–12)
Neutro Abs: 6.7 10*3/uL (ref 1.7–7.7)
Neutrophils Relative %: 60 % (ref 43–77)

## 2011-06-21 LAB — PRO B NATRIURETIC PEPTIDE: Pro B Natriuretic peptide (BNP): 5 pg/mL (ref 0–125)

## 2011-06-21 LAB — ABO/RH: ABO/RH(D): A POS

## 2011-06-21 LAB — MRSA PCR SCREENING: MRSA by PCR: NEGATIVE

## 2011-06-21 LAB — APTT: aPTT: 24 seconds (ref 24–37)

## 2011-06-21 LAB — CARDIAC PANEL(CRET KIN+CKTOT+MB+TROPI)
CK, MB: 218.7 ng/mL (ref 0.3–4.0)
Total CK: 4026 U/L — ABNORMAL HIGH (ref 7–232)

## 2011-06-21 LAB — MAGNESIUM: Magnesium: 2 mg/dL (ref 1.5–2.5)

## 2011-06-21 LAB — HEMOGLOBIN A1C: Mean Plasma Glucose: 126 mg/dL — ABNORMAL HIGH (ref <117)

## 2011-06-21 LAB — TROPONIN I: Troponin I: <0.3 ng/mL (ref <0.30)

## 2011-06-21 LAB — OCCULT BLOOD, POC DEVICE: Fecal Occult Bld: NEGATIVE

## 2011-06-21 LAB — TYPE AND SCREEN
ABO/RH(D): A POS
Antibody Screen: NEGATIVE

## 2011-06-21 LAB — PLATELET COUNT: Platelets: 171 10*3/uL (ref 150–400)

## 2011-06-22 LAB — LIPID PANEL
Cholesterol: 128 mg/dL (ref 0–200)
HDL: 37 mg/dL — ABNORMAL LOW (ref >39)
Total CHOL/HDL Ratio: 3.5 RATIO
VLDL: 19 mg/dL (ref 0–40)

## 2011-06-22 LAB — CARDIAC PANEL(CRET KIN+CKTOT+MB+TROPI)
CK, MB: 68.1 ng/mL (ref 0.3–4.0)
Relative Index: 3 — ABNORMAL HIGH (ref 0.0–2.5)
Total CK: 3138 U/L — ABNORMAL HIGH (ref 7–232)
Total CK: 2265 U/L — ABNORMAL HIGH (ref 7–232)

## 2011-06-22 LAB — HEPARIN LEVEL (UNFRACTIONATED): Heparin Unfractionated: 0.4 IU/mL (ref 0.30–0.70)

## 2011-06-22 LAB — CBC
HCT: 38.4 % — ABNORMAL LOW (ref 39.0–52.0)
RBC: 4.09 MIL/uL — ABNORMAL LOW (ref 4.22–5.81)
RDW: 13.3 % (ref 11.5–15.5)
WBC: 10.9 10*3/uL — ABNORMAL HIGH (ref 4.0–10.5)

## 2011-06-22 LAB — BASIC METABOLIC PANEL
BUN: 20 mg/dL (ref 6–23)
CO2: 27 mEq/L (ref 19–32)
Chloride: 99 mEq/L (ref 96–112)
GFR calc Af Amer: 51 mL/min — ABNORMAL LOW (ref >90)
Potassium: 4 mEq/L (ref 3.5–5.1)

## 2011-06-23 ENCOUNTER — Inpatient Hospital Stay (HOSPITAL_COMMUNITY): Payer: Medicare Other

## 2011-06-23 LAB — BASIC METABOLIC PANEL
Calcium: 8.3 mg/dL — ABNORMAL LOW (ref 8.4–10.5)
Creatinine, Ser: 1.75 mg/dL — ABNORMAL HIGH (ref 0.50–1.35)
GFR calc Af Amer: 42 mL/min — ABNORMAL LOW (ref >90)
GFR calc non Af Amer: 37 mL/min — ABNORMAL LOW (ref >90)

## 2011-06-23 LAB — CBC
HCT: 34.3 % — ABNORMAL LOW (ref 39.0–52.0)
MCHC: 33.3 g/dL (ref 30.0–36.0)
MCHC: 33.8 g/dL (ref 30.0–36.0)
RDW: 13.4 % (ref 11.5–15.5)
RDW: 13.4 % (ref 11.5–15.5)
WBC: 10.1 10*3/uL (ref 4.0–10.5)

## 2011-06-23 LAB — POCT I-STAT, CHEM 8
Calcium, Ion: 1.06 mmol/L — ABNORMAL LOW (ref 1.12–1.32)
HCT: 42 % (ref 39.0–52.0)
Hemoglobin: 14.3 g/dL (ref 13.0–17.0)
TCO2: 17 mmol/L (ref 0–100)

## 2011-06-23 LAB — PRO B NATRIURETIC PEPTIDE: Pro B Natriuretic peptide (BNP): 1691 pg/mL — ABNORMAL HIGH (ref 0–125)

## 2011-06-23 LAB — HEPARIN LEVEL (UNFRACTIONATED): Heparin Unfractionated: 0.69 IU/mL (ref 0.30–0.70)

## 2011-06-23 LAB — POCT ACTIVATED CLOTTING TIME: Activated Clotting Time: 435 seconds

## 2011-06-24 LAB — HEPARIN LEVEL (UNFRACTIONATED)
Heparin Unfractionated: 0.99 IU/mL — ABNORMAL HIGH (ref 0.30–0.70)
Heparin Unfractionated: 0.39 IU/mL (ref 0.30–0.70)

## 2011-06-24 LAB — CBC
MCH: 32.1 pg (ref 26.0–34.0)
Platelets: 158 10*3/uL (ref 150–400)
RBC: 3.68 MIL/uL — ABNORMAL LOW (ref 4.22–5.81)

## 2011-06-25 LAB — CBC
HCT: 35.2 % — ABNORMAL LOW (ref 39.0–52.0)
Hemoglobin: 11.4 g/dL — ABNORMAL LOW (ref 13.0–17.0)
MCHC: 32.4 g/dL (ref 30.0–36.0)
MCV: 95.9 fL (ref 78.0–100.0)
RDW: 13.1 % (ref 11.5–15.5)
WBC: 8.6 10*3/uL (ref 4.0–10.5)

## 2011-06-25 LAB — BASIC METABOLIC PANEL
BUN: 17 mg/dL (ref 6–23)
Chloride: 104 mEq/L (ref 96–112)
Creatinine, Ser: 1.48 mg/dL — ABNORMAL HIGH (ref 0.50–1.35)
GFR calc Af Amer: 52 mL/min — ABNORMAL LOW (ref >90)
Glucose, Bld: 115 mg/dL — ABNORMAL HIGH (ref 70–99)

## 2011-06-25 NOTE — Cardiovascular Report (Signed)
NAMESWAY, HOGLUND NO.:  [id number]  MEDICAL RECORD NO.:  [id number]  LOCATION:  2901                         FACILITY:  [facility]  PHYSICIAN:  Shelva Majestic, M.D.     DATE OF BIRTH:  Mar 17, 1935  DATE OF PROCEDURE: DATE OF DISCHARGE:                           CARDIAC CATHETERIZATION   PROCEDURE:  Emergent cardiac catheterization and percutaneous coronary intervention.  INDICATIONS:  [name] is a 76 year old gentleman who has a history of COPD and prior coronary artery disease.  In 2006, he suffered an anterior wall myocardial infarction.  At that time, he underwent a bifurcation intervention to the LAD diagonal vessel and apparently was involved in the Horizons trial.  He received a study drug 2.5 x 24 mm in the LAD which was stented.  A subsequent cath 1 year later did not show a significant restenosis.  Apparently, over the last several years, the patient has been followed exclusively at the [facility], [place] have a history of COPD.  He has noticed some vague symptoms of chest discomfort.  Apparently, approximately 830 today while at home, he developed significant chest pressure.  EMS arrived and in code and ST- segment elevation anterior MI.  He was transported acutely to [facility].  He arrived to [facility] at 10.06.  He was taken emergently to the cardiac catheterization laboratory and arrived in the catheterization laboratory June 11, 2011.  Acute catheterization is being performed.  PROCEDURE:  After premedication with Versed 2 mg plus fentanyl 25 mcg, the patient was prepped and draped in usual fashion.  Upon arrival to the catheterization laboratory, the patient was still experiencing 8/10 chest pain.  Right femoral artery was punctured anteriorly and a 6- French sheath was inserted.  Diagnostic catheterization was done utilizing 6-French Judkins for left and right coronary catheters.  With the demonstration of total  proximal LAD occlusion with TIMI 0 flow, PCI was performed.  A 6-French XB LAD guide was used for the guiding catheter.  Initially, Angiomax bolus plus infusion was administered. Apparently, the patient was on daily Plavix.  Since he did have a occlusion proximal to the stented segment, he was given Brilinta 180 mg for antiplatelet therapy.  A Asahi medium wire that was advanced into the proximal LAD and initially it was placed into the diagonal vessel. A two 5 times 12-mm trek balloon was used for dilatation low level initially.  A second wire, a Choice PT wire was able to be advanced through the previously placed stented LAD segment and advanced to the distal LAD.  Dilatation was done initially with the 2.5 x 12-mm balloon. There was significant thrombus burden in the LAD at the stented segment. Next, a known expressway thrombectomy catheter was then inserted with clot retrieval.  A 2.75 x 20 mm noncompliant Quantum balloon was then inserted into the previously placed 2.5 x 24 mm study drug stent from the catheterizations in 2006.  At that time, in 2006, this has had been post dilated with a 275 balloon.  This 275 balloon was now dilated 8, 11 and 13 atmospheres.  There was still residual thrombus and haziness. Consequently, a decision was made to  place a new DES stent at this site to cover the previously placed stent.  The resolute 2.75 x 26 mm stent was then carefully positioned just beyond the diagonal takeoff and covered the entire previous 24-mm length stent to the distal aspect. This was dilated x2 up to a 11 atmospheres.  A 3.0 x 20-mm noncompliant trek was used for post stent dilatation, dilated up to 12-mm.  [facility]out angiography confirmed an excellent angiographic result.  There was brisk TIMI-3 flow.  There is no evidence for dissection.  There was no residual thrombus.  Of note, prior to the new stent deployment, the wire that had been in the diagonal vessel had been removed and  the stent was deployed with only 1 wire in place napping down the LAD.  With the demonstration of an excellent angiographic result, the wire and balloon were removed as was the guiding catheter.  A 6-French pigtail catheter was then inserted for left ventriculography.  With the patient's elevated EDP, decision was made not to pursue distal aortography.  The patient was given 40 mg Lasix.  He also was given 2.5 mg IV Lopressor since he was having frequent PACs.  The arterial sheath was sutured in place.  He will be continued on Integrilin.  The Angiomax will be discontinued when the bed runs out.  He left the catheterization laboratory with stable hemodynamics completely chest pain free.  HEMODYNAMIC DATA:  Central aortic pressure is 118/65, left ventricle pressure 118/32, post A-wave 40.  ANGIOGRAPHIC DATA:  The left main coronary artery was a short normal vessel which bifurcated into the LAD and left circumflex system.  The LAD was totally occluded proximally prior to the previously placed stented segment.  The circumflex vessel had 30% narrowing just proximal to its bifurcation to a marginal vessel.  The right coronary artery was a dominant vessel that had 30% narrowing proximally and 40% smooth PDA stenosis.  Following percutaneous coronary intervention to the LAD with initial PTCA just proximal to the diagonal vessel and PTCA stenting of the totally occluded LAD system with ultimate insertion of a new 2.75 x 26 mm resolute stent over the remotely placed 2.5 x 24-mm study drug stent from the Horizons trial with post stent dilatation up to approximately 3.0-mm tapering to 2.91-mm, it was now resumption of TIMI-3 flow.  The 100% occlusion was reduced to 0%.  There was no evidence for dissection. The LAD gave rise to an additional large second diagonal vessel which had arisen from the distal aspect of the remotely placed stent.  The LAD extended to the apex.  RAO  ventriculography revealed an ejection fraction of approximately 40% with mid distal anterolateral hypocontractility.  IMPRESSION: 1. Acute ST-segment elevation anterior wall myocardial infarction     secondary to total occlusion of the very proximal LAD proximal to     the remotely placed Horizon study drug stent inserted in 2006. 2. A 30% narrowing in the AV groove circumflex. 3. A 30% proximal and RCA narrowing with 40% smooth narrowing in the     proximal portion of the PDA. 4. Percutaneous coronary intervention to the LAD with PTCA, expressway     thrombectomy, and ultimate stenting with the 100% occlusion being     reduced to 0%, and ultimate insertion of a new 2.75 x 26-mm DES     resolute stent post dilated to 3.0-mm tapering to 2.91-mm done with     bivalirudin/Integrilin/and oral Brilinta. 5. Chest pain onset commenced at 8:30.  The  patient arrived to [facility] at 10:06.  The patient arrived to the cardiac     catheterization laboratory at 10:17.  The first balloon inflation     was at 10:42 giving a balloon time from cath lab arrival at 25     minutes and from arrival to [facility] at 36 minutes.          ______________________________ Shelva Majestic, M.D.     TK/MEDQ  D:  06/21/2011  T:  06/21/2011  Job:  [id number]  Electronically Signed by Shelva Majestic M.D. on 06/25/2011 01:29:32 PM

## 2011-06-25 NOTE — H&P (Signed)
NAMEFILIBERTO, KISTER NO.:  [id number]  MEDICAL RECORD NO.:  [id number]  LOCATION:  2901                         FACILITY:  [place]  PHYSICIAN:  Shelva Majestic, M.D.     DATE OF BIRTH:  [birth year]  DATE OF ADMISSION:  06/21/2011 DATE OF DISCHARGE:                             HISTORY & PHYSICAL   CHIEF COMPLAINT:  Chest pain.  HISTORY OF PRESENT ILLNESS:  [name] is a 76 year old male who had previously been seen by [facility], he is now followed at the [place], [place].  He has a history of coronary artery disease with an anterior MI in 2006 treated with a kissing balloon by Dr. Lia Foyer.  He has not seen [facility] since 2010.  He has done well from a cardiac standpoint.  He has not used nitroglycerin.  This morning, he got up to use a shower and while in the shower, he developed cough.  This became fairly severe.  He then developed a midsternal chest pain and dizziness.  EMS was called and a STEMI was called and he was transferred urgently to [facility].  His EKG showed ST elevation in V2 and V1. He was treated urgently in the cath lab by Dr. [name].  Please see Dr. Evette Georges note for complete details.  PATIENT'S PAST MEDICAL HISTORY:  Remarkable for COPD.  He uses oxygen at night and p.r.n. during the day.  He has a history of treated dyslipidemia.  He has a history of treated hypertension.  He has had no major surgeries except for a prostate seed implant for prostate cancer 11 years ago by Dr. [name].  He still sees Dr. Rosana Hoes and has seen him recently.  HOME MEDICATIONS: 1. Metoprolol 50 mg b.i.d. 2. Albuterol inhaler t.i.d. p.r.n. 3. Plavix 75 mg daily. 4. Lisinopril 5 mg b.i.d. 5. Aspirin 325 mg a day. 6. Singulair 10 mg a day. 7. Omeprazole 20 mg a day. 8. Simvastatin 80 mg a day. 9. Celebrex 200 mg b.i.d. 10.Hydrocodone p.r.n. 11.Combivent inhaler 2 puffs b.i.d.  ALLERGIES:  He is intolerant to DEMEROL, but has no other  drug allergies.  SOCIAL HISTORY:  He is married.  He has 5 total children, 3 stepchildren and 2 natural children.  He lives with his wife.  He says he quit smoking 4 years ago, but does occasionally have a cigarette now and then.  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  He has 5 grandchildren, all boys.  FAMILY HISTORY:  Remarkable for coronary artery disease in his parents and his siblings.  He also has a history of diabetes, his mother has diabetes and his brother has diabetes.  REVIEW OF SYSTEMS:  Essentially, unremarkable except for noted above. He denies any GI bleeding or melena, he had a remote ulcer when he was a teenager.  He does have a history of kidney stones.  He does have some urinary incontinence for his prostate cancer, he is seeing Dr. [name] about this.  He did have a negative sleep study 3 years ago, and denies having sleep apnea.  He does not have diabetes.  He has not had palpitations or syncope.  He has not had recent angina.  PHYSICAL EXAMINATION:  VITAL SIGNS:  Blood pressure 98/76, heart rate 92 and respirations 12. GENERAL:  He is a morbidly obese male with oxygen on, in no acute distress. HEENT:  Normocephalic.  Extraocular movements are intact.  Sclerae are anicteric.  Lids and conjunctivae are within normal limits. NECK:  Without JVD or bruit. CHEST:  Diminished breath sounds, but there is no wheezing or rales. CARDIAC:  Diminished heart sounds with regular rate and rhythm. ABDOMEN:  Obese and nontender. EXTREMITIES:  No edema.  Distal pulses are 2+ over 4 bilaterally. NEURO:  Grossly intact.  He is awake, alert, oriented and cooperative. Moves all extremities without obvious deficit. SKIN:  Cool and dry.  LABORATORY DATA:  White count 11.1, hemoglobin 13.8, hematocrit 40.3, platelets 189, INR 1.08.  Sodium 136, potassium 4.6, BUN 20, creatinine 1.49.  Liver functions were normal.  Initial CK-MB and troponin are negative.  BNP  is 5.  IMPRESSION: 1. Acute anterior ST-elevation myocardial infarction. 2. History of coronary artery disease with prior myocardial infarction     in 2006 treated with angioplasty, I believe this was a kissing     balloon technique to the LAD and diagonal. 3. Treated hypertension. 4. Morbid obesity, the patient says he had a negative sleep study 3     years ago. 5. Treated dyslipidemia. 6. History of prostate cancer with a seed implant 11 years ago by Dr.     [name].  PLAN:  The patient was taken urgently to the cath lab by Dr. [name] for intervention.     Erlene Quan, P.A.   ______________________________ Shelva Majestic, M.D.    Meryl Dare  D:  06/21/2011  T:  06/21/2011  Job:  [id number]  Electronically Signed by Kerin Ransom P.A. on 06/23/2011 09:59:53 AM Electronically Signed by Shelva Majestic M.D. on 06/25/2011 01:29:36 PM

## 2011-06-26 DIAGNOSIS — M79609 Pain in unspecified limb: Secondary | ICD-10-CM

## 2011-06-26 DIAGNOSIS — I236 Thrombosis of atrium, auricular appendage, and ventricle as current complications following acute myocardial infarction: Secondary | ICD-10-CM

## 2011-06-26 HISTORY — DX: Thrombosis of atrium, auricular appendage, and ventricle as current complications following acute myocardial infarction: I23.6

## 2011-06-26 LAB — CBC
HCT: 33.4 % — ABNORMAL LOW (ref 39.0–52.0)
HCT: 31.5 % — ABNORMAL LOW (ref 39.0–52.0)
Hemoglobin: 10.9 g/dL — ABNORMAL LOW (ref 13.0–17.0)
MCH: 32.1 pg (ref 26.0–34.0)
MCHC: 33.8 g/dL (ref 30.0–36.0)
MCHC: 34.6 g/dL (ref 30.0–36.0)
MCV: 94.9 fL (ref 78.0–100.0)
Platelets: 203 10*3/uL (ref 150–400)
RDW: 13.4 % (ref 11.5–15.5)
RDW: 13.3 % (ref 11.5–15.5)
WBC: 15.5 10*3/uL — ABNORMAL HIGH (ref 4.0–10.5)
WBC: 10.9 10*3/uL — ABNORMAL HIGH (ref 4.0–10.5)

## 2011-06-26 LAB — DIFFERENTIAL
Eosinophils Absolute: 0.4 10*3/uL (ref 0.0–0.7)
Eosinophils Relative: 2 % (ref 0–5)
Lymphocytes Relative: 15 % (ref 12–46)
Lymphs Abs: 2.4 10*3/uL (ref 0.7–4.0)
Monocytes Absolute: 1.7 10*3/uL — ABNORMAL HIGH (ref 0.1–1.0)
Monocytes Relative: 11 % (ref 3–12)

## 2011-06-26 LAB — PRO B NATRIURETIC PEPTIDE: Pro B Natriuretic peptide (BNP): 1736 pg/mL — ABNORMAL HIGH (ref 0–125)

## 2011-06-26 LAB — BASIC METABOLIC PANEL
CO2: 25 mEq/L (ref 19–32)
Chloride: 103 mEq/L (ref 96–112)
Creatinine, Ser: 1.76 mg/dL — ABNORMAL HIGH (ref 0.50–1.35)
GFR calc Af Amer: 42 mL/min — ABNORMAL LOW (ref >90)
Sodium: 137 mEq/L (ref 135–145)

## 2011-06-27 ENCOUNTER — Other Ambulatory Visit: Payer: Self-pay

## 2011-06-27 LAB — CBC
Hemoglobin: 9.5 g/dL — ABNORMAL LOW (ref 13.0–17.0)
MCH: 31.8 pg (ref 26.0–34.0)
MCV: 94.6 fL (ref 78.0–100.0)
Platelets: 166 10*3/uL (ref 150–400)
RBC: 2.99 MIL/uL — ABNORMAL LOW (ref 4.22–5.81)
WBC: 11.3 10*3/uL — ABNORMAL HIGH (ref 4.0–10.5)

## 2011-06-27 LAB — BASIC METABOLIC PANEL
Chloride: 102 mEq/L (ref 96–112)
GFR calc Af Amer: 43 mL/min — ABNORMAL LOW (ref >90)
GFR calc non Af Amer: 37 mL/min — ABNORMAL LOW (ref >90)
Potassium: 3.6 mEq/L (ref 3.5–5.1)
Sodium: 136 mEq/L (ref 135–145)

## 2011-06-27 LAB — POCT I-STAT 4, (NA,K, GLUC, HGB,HCT)
Glucose, Bld: 114 mg/dL — ABNORMAL HIGH (ref 70–99)
HCT: 27 % — ABNORMAL LOW (ref 39.0–52.0)
Potassium: 4.4 mEq/L (ref 3.5–5.1)
Sodium: 137 mEq/L (ref 135–145)

## 2011-06-27 LAB — PROTIME-INR: Prothrombin Time: 23.3 seconds — ABNORMAL HIGH (ref 11.6–15.2)

## 2011-06-27 LAB — HEMOGLOBIN AND HEMATOCRIT, BLOOD: HCT: 24.6 % — ABNORMAL LOW (ref 39.0–52.0)

## 2011-06-28 ENCOUNTER — Inpatient Hospital Stay (HOSPITAL_COMMUNITY): Payer: Medicare Other

## 2011-06-28 LAB — HEMOGLOBIN AND HEMATOCRIT, BLOOD: Hemoglobin: 8.5 g/dL — ABNORMAL LOW (ref 13.0–17.0)

## 2011-06-28 LAB — BASIC METABOLIC PANEL
BUN: 24 mg/dL — ABNORMAL HIGH (ref 6–23)
CO2: 25 mEq/L (ref 19–32)
Calcium: 8.4 mg/dL (ref 8.4–10.5)
Chloride: 104 mEq/L (ref 96–112)
Creatinine, Ser: 1.72 mg/dL — ABNORMAL HIGH (ref 0.50–1.35)
GFR calc non Af Amer: 41 mL/min — ABNORMAL LOW (ref >90)
Glucose, Bld: 117 mg/dL — ABNORMAL HIGH (ref 70–99)
Glucose, Bld: 109 mg/dL — ABNORMAL HIGH (ref 70–99)
Potassium: 4.3 mEq/L (ref 3.5–5.1)
Sodium: 138 mEq/L (ref 135–145)

## 2011-06-28 LAB — CBC
HCT: 25.4 % — ABNORMAL LOW (ref 39.0–52.0)
HCT: 23.8 % — ABNORMAL LOW (ref 39.0–52.0)
Hemoglobin: 8.1 g/dL — ABNORMAL LOW (ref 13.0–17.0)
MCH: 31.9 pg (ref 26.0–34.0)
MCV: 93.4 fL (ref 78.0–100.0)
MCV: 93.7 fL (ref 78.0–100.0)
Platelets: 180 10*3/uL (ref 150–400)
Platelets: 206 10*3/uL (ref 150–400)
RBC: 2.72 MIL/uL — ABNORMAL LOW (ref 4.22–5.81)
RBC: 2.54 MIL/uL — ABNORMAL LOW (ref 4.22–5.81)
WBC: 11.3 10*3/uL — ABNORMAL HIGH (ref 4.0–10.5)
WBC: 11.6 10*3/uL — ABNORMAL HIGH (ref 4.0–10.5)

## 2011-06-28 MED ORDER — VALSARTAN 80 MG PO TABS
40.0000 mg | ORAL_TABLET | Freq: Two times a day (BID) | ORAL | Status: DC
  Administered 2011-06-29 – 2011-07-04 (×9): 40 mg via ORAL
  Filled 2011-06-28: qty 0.5
  Filled 2011-06-28: qty 1
  Filled 2011-06-28 (×3): qty 0.5
  Filled 2011-06-28: qty 1
  Filled 2011-06-28 (×8): qty 0.5
  Filled 2011-06-28: qty 1
  Filled 2011-06-28 (×3): qty 0.5

## 2011-06-28 MED ORDER — POLYETHYLENE GLYCOL 3350 17 G PO PACK
17.0000 g | PACK | Freq: Every day | ORAL | Status: DC
  Administered 2011-06-29 – 2011-07-04 (×2): 17 g via ORAL
  Filled 2011-06-28 (×7): qty 1

## 2011-06-28 MED ORDER — PANTOPRAZOLE SODIUM 40 MG PO TBEC
40.0000 mg | DELAYED_RELEASE_TABLET | Freq: Every day | ORAL | Status: DC
  Administered 2011-06-29 – 2011-07-04 (×6): 40 mg via ORAL
  Filled 2011-06-28 (×5): qty 1

## 2011-06-28 MED ORDER — TICAGRELOR 90 MG PO TABS
90.0000 mg | ORAL_TABLET | Freq: Two times a day (BID) | ORAL | Status: DC
  Administered 2011-06-28 – 2011-07-04 (×12): 90 mg via ORAL
  Filled 2011-06-28 (×14): qty 1

## 2011-06-28 MED ORDER — ISOSORBIDE MONONITRATE ER 30 MG PO TB24
30.0000 mg | ORAL_TABLET | Freq: Every day | ORAL | Status: DC
  Filled 2011-06-28 (×2): qty 1

## 2011-06-28 MED ORDER — NITROGLYCERIN IN D5W 200-5 MCG/ML-% IV SOLN: 2.0000 ug/min | INTRAVENOUS | Status: DC

## 2011-06-28 MED ORDER — THERA M PLUS PO TABS
1.0000 | ORAL_TABLET | Freq: Every day | ORAL | Status: DC
  Administered 2011-06-29 – 2011-07-04 (×6): 1 via ORAL
  Filled 2011-06-28 (×8): qty 1

## 2011-06-28 MED ORDER — DIPHENHYDRAMINE HCL 25 MG PO CAPS: 25.0000 mg | ORAL_CAPSULE | ORAL | Status: DC | PRN

## 2011-06-28 MED ORDER — ALUM & MAG HYDROXIDE-SIMETH 200-200-20 MG/5ML PO SUSP: 15.0000 mL | ORAL | Status: DC | PRN

## 2011-06-28 MED ORDER — LEVALBUTEROL HCL 1.25 MG/0.5ML IN NEBU
1.2500 mg | INHALATION_SOLUTION | Freq: Four times a day (QID) | RESPIRATORY_TRACT | Status: DC
  Filled 2011-06-28 (×8): qty 0.5

## 2011-06-28 MED ORDER — METOPROLOL TARTRATE 25 MG PO TABS: 25.0000 mg | ORAL_TABLET | Freq: Two times a day (BID) | ORAL | Status: DC

## 2011-06-28 MED ORDER — OXYCODONE-ACETAMINOPHEN 5-325 MG PO TABS: 1.0000 | ORAL_TABLET | Freq: Four times a day (QID) | ORAL | Status: DC | PRN

## 2011-06-28 MED ORDER — HYPROMELLOSE (GONIOSCOPIC) 2.5 % OP SOLN
1.0000 [drp] | Freq: Two times a day (BID) | OPHTHALMIC | Status: DC
  Administered 2011-06-28 – 2011-07-01 (×5): 1 [drp] via OPHTHALMIC

## 2011-06-28 MED ORDER — ACETAMINOPHEN 325 MG PO TABS: 650.0000 mg | ORAL_TABLET | ORAL | Status: DC | PRN

## 2011-06-28 MED ORDER — LEVALBUTEROL HCL 0.63 MG/3ML IN NEBU
0.6300 mg | INHALATION_SOLUTION | Freq: Four times a day (QID) | RESPIRATORY_TRACT | Status: DC | PRN
  Filled 2011-06-28: qty 3

## 2011-06-28 MED ORDER — LOPERAMIDE HCL 2 MG PO CAPS: 4.0000 mg | ORAL_CAPSULE | Freq: Once | ORAL | Status: AC | PRN

## 2011-06-28 MED ORDER — LOPERAMIDE HCL 2 MG PO CAPS: 2.0000 mg | ORAL_CAPSULE | ORAL | Status: DC | PRN

## 2011-06-28 MED ORDER — ZOLPIDEM TARTRATE 5 MG PO TABS: 5.0000 mg | ORAL_TABLET | Freq: Every evening | ORAL | Status: DC | PRN

## 2011-06-28 MED ORDER — GUAIFENESIN-DM 100-10 MG/5ML PO SYRP: 15.0000 mL | ORAL_SOLUTION | ORAL | Status: DC | PRN

## 2011-06-28 MED ORDER — PRAMOXINE HCL 1 % RE OINT
1.0000 "application " | TOPICAL_OINTMENT | Freq: Three times a day (TID) | RECTAL | Status: DC | PRN
  Filled 2011-06-28: qty 30

## 2011-06-28 MED ORDER — MONTELUKAST SODIUM 10 MG PO TABS
10.0000 mg | ORAL_TABLET | Freq: Every day | ORAL | Status: DC
  Administered 2011-06-28 – 2011-07-03 (×6): 10 mg via ORAL
  Filled 2011-06-28 (×7): qty 1

## 2011-06-28 MED ORDER — ONDANSETRON HCL 4 MG/2ML IJ SOLN: 4.0000 mg | Freq: Four times a day (QID) | INTRAMUSCULAR | Status: DC | PRN

## 2011-06-28 MED ORDER — ALPRAZOLAM 0.25 MG PO TABS
0.2500 mg | ORAL_TABLET | Freq: Two times a day (BID) | ORAL | Status: DC | PRN
  Administered 2011-06-29 – 2011-07-03 (×4): 0.25 mg via ORAL
  Filled 2011-06-28: qty 1

## 2011-06-28 MED ORDER — TRAMADOL HCL 50 MG PO TABS
50.0000 mg | ORAL_TABLET | Freq: Four times a day (QID) | ORAL | Status: DC | PRN
  Filled 2011-06-28: qty 1

## 2011-06-28 MED ORDER — ROSUVASTATIN CALCIUM 40 MG PO TABS
40.0000 mg | ORAL_TABLET | Freq: Every day | ORAL | Status: DC
  Administered 2011-06-29 – 2011-07-03 (×5): 40 mg via ORAL
  Filled 2011-06-28 (×7): qty 1

## 2011-06-28 MED ORDER — NITROGLYCERIN 0.4 MG SL SUBL: 0.4000 mg | SUBLINGUAL_TABLET | SUBLINGUAL | Status: DC | PRN

## 2011-06-28 MED ORDER — IPRATROPIUM-ALBUTEROL 18-103 MCG/ACT IN AERO: 2.0000 | INHALATION_SPRAY | Freq: Three times a day (TID) | RESPIRATORY_TRACT | Status: DC | PRN

## 2011-06-28 MED ORDER — IPRATROPIUM-ALBUTEROL 18-103 MCG/ACT IN AERO: 2.0000 | INHALATION_SPRAY | Freq: Three times a day (TID) | RESPIRATORY_TRACT | Status: DC

## 2011-06-28 MED ORDER — CARISOPRODOL 350 MG PO TABS
350.0000 mg | ORAL_TABLET | Freq: Three times a day (TID) | ORAL | Status: DC | PRN
  Administered 2011-07-03: 350 mg via ORAL
  Filled 2011-06-28: qty 1

## 2011-06-28 MED ORDER — MORPHINE SULFATE 4 MG/ML IJ SOLN: 3.0000 mg | INTRAMUSCULAR | Status: DC | PRN

## 2011-06-28 MED ORDER — ASPIRIN EC 81 MG PO TBEC
81.0000 mg | DELAYED_RELEASE_TABLET | Freq: Every day | ORAL | Status: DC
  Administered 2011-06-29 – 2011-07-04 (×6): 81 mg via ORAL
  Filled 2011-06-28 (×8): qty 1

## 2011-06-28 MED ORDER — HYDROCODONE-ACETAMINOPHEN 5-325 MG PO TABS: 2.0000 | ORAL_TABLET | Freq: Three times a day (TID) | ORAL | Status: DC | PRN

## 2011-06-29 ENCOUNTER — Inpatient Hospital Stay (HOSPITAL_COMMUNITY): Payer: Medicare Other

## 2011-06-29 DIAGNOSIS — D62 Acute posthemorrhagic anemia: Secondary | ICD-10-CM | POA: Diagnosis not present

## 2011-06-29 DIAGNOSIS — I2102 ST elevation (STEMI) myocardial infarction involving left anterior descending coronary artery: Secondary | ICD-10-CM | POA: Diagnosis present

## 2011-06-29 DIAGNOSIS — J449 Chronic obstructive pulmonary disease, unspecified: Secondary | ICD-10-CM | POA: Diagnosis present

## 2011-06-29 DIAGNOSIS — I5023 Acute on chronic systolic (congestive) heart failure: Secondary | ICD-10-CM | POA: Diagnosis present

## 2011-06-29 DIAGNOSIS — C61 Malignant neoplasm of prostate: Secondary | ICD-10-CM | POA: Diagnosis present

## 2011-06-29 DIAGNOSIS — R040 Epistaxis: Secondary | ICD-10-CM | POA: Diagnosis not present

## 2011-06-29 LAB — POCT I-STAT 3, ART BLOOD GAS (G3+)
Bicarbonate: 23.3 mEq/L (ref 20.0–24.0)
O2 Saturation: 95 %
TCO2: 24 mmol/L (ref 0–100)
pH, Arterial: 7.436 (ref 7.350–7.450)

## 2011-06-29 LAB — BASIC METABOLIC PANEL
CO2: 25 mEq/L (ref 19–32)
Chloride: 105 mEq/L (ref 96–112)
Glucose, Bld: 129 mg/dL — ABNORMAL HIGH (ref 70–99)
Sodium: 139 mEq/L (ref 135–145)

## 2011-06-29 LAB — CBC
Hemoglobin: 7.9 g/dL — ABNORMAL LOW (ref 13.0–17.0)
MCH: 31.3 pg (ref 26.0–34.0)
Platelets: 202 10*3/uL (ref 150–400)
RBC: 2.52 MIL/uL — ABNORMAL LOW (ref 4.22–5.81)
WBC: 11.6 10*3/uL — ABNORMAL HIGH (ref 4.0–10.5)

## 2011-06-29 MED ORDER — KETOROLAC TROMETHAMINE 30 MG/ML IJ SOLN: 30.0000 mg | Freq: Once | INTRAMUSCULAR | Status: AC

## 2011-06-29 MED ORDER — SODIUM CHLORIDE 0.9 % IV SOLN
INTRAVENOUS | Status: DC
  Administered 2011-06-29: 03:00:00 via INTRAVENOUS

## 2011-06-29 MED ORDER — KETOROLAC TROMETHAMINE 30 MG/ML IJ SOLN
INTRAMUSCULAR | Status: AC
  Filled 2011-06-29: qty 1

## 2011-06-29 MED ORDER — LEVALBUTEROL HCL 1.25 MG/0.5ML IN NEBU
1.2500 mg | INHALATION_SOLUTION | Freq: Four times a day (QID) | RESPIRATORY_TRACT | Status: DC
  Administered 2011-06-29 – 2011-07-04 (×20): 1.25 mg via RESPIRATORY_TRACT
  Filled 2011-06-29 (×27): qty 0.5

## 2011-06-29 NOTE — Progress Notes (Signed)
Subjective:  Mr. Carufel had right groin pain and agitation overnight. He was noted to be hypotensive again which improved with fluids. A CT of the pelvis was obtained which showed a complex fluid collection/hematoma without clear evidence for active bleeding.  Objective:  Temp:  [98.6 F (37 C)] 98.6 F (37 C) (11/04 0400) Pulse Rate:  [93-108] 93  (11/04 0757) Resp:  [17-20] 20  (11/04 0757) BP: (79-104)/(56-68) 88/58 mmHg (11/04 0800) SpO2:  [96 %-99 %] 97 % (11/04 0757) FiO2 (%):  [0.3 %] 0.3 % (11/04 0300) Weight:  [97 kg (213 lb 13.5 oz)] 213 lb 13.5 oz (97 kg) (11/04 0330) Weight change:   Intake/Output from previous day: 11/03 0701 - 11/04 0700 In: 525 [I.V.:525] Out: 300 [Urine:300]  Intake/Output from this shift: Total I/O In: 315 [P.O.:240; I.V.:75] Out: 400 [Urine:400]  Medications: Current Facility-Administered Medications  Medication Dose Route Frequency Provider Last Rate Last Dose  . 0.9 %  sodium chloride infusion   Intravenous Continuous Amy L. Thompson, PA 75 mL/hr at 06/29/11 0300    . acetaminophen (TYLENOL) tablet 650 mg  650 mg Oral Q4H PRN Eudelia Bunch, PHARMD      . ALPRAZolam Duanne Moron) tablet 0.25 mg  0.25 mg Oral BID PRN Eudelia Bunch, PHARMD      . alum & mag hydroxide-simeth (MAALOX/MYLANTA) 200-200-20 MG/5ML suspension 15-30 mL  15-30 mL Oral Q2H PRN Eudelia Bunch, PHARMD      . aspirin EC tablet 81 mg  81 mg Oral Daily Eudelia Bunch, PHARMD      . carisoprodol (SOMA) tablet 350 mg  350 mg Oral TID PRN Eudelia Bunch, PHARMD      . diphenhydrAMINE (BENADRYL) capsule 25 mg  25 mg Oral Q4H PRN Eudelia Bunch, PHARMD      . guaiFENesin-dextromethorphan (ROBITUSSIN DM) 100-10 MG/5ML syrup 15 mL  15 mL Oral Q4H PRN Eudelia Bunch, PHARMD      . HYDROcodone-acetaminophen (NORCO) 5-325 MG per tablet 2 tablet  2 tablet Oral TID PRN Eudelia Bunch, PHARMD      . hydroxypropyl methylcellulose (ISOPTO TEARS) 2.5 % ophthalmic solution 1 drop  1 drop  Both Eyes BID Eudelia Bunch, PHARMD   1 drop at 06/28/11 2200  . isosorbide mononitrate (IMDUR) 24 hr tablet 30 mg  30 mg Oral Daily Eudelia Bunch, PHARMD      . ketorolac (TORADOL) 30 MG/ML injection 30 mg  30 mg Intravenous Once Amy L. Grandville Silos, PA      . levalbuterol [facility]) nebulizer solution 0.63 mg  0.63 mg Nebulization Q6H PRN Eudelia Bunch, PHARMD      . levalbuterol Penne Lash) nebulizer solution 1.25 mg  1.25 mg Nebulization Q6H Thomas A Kelly   1.25 mg at 06/29/11 0754  . loperamide (IMODIUM) capsule 2 mg  2 mg Oral PRN Eudelia Bunch, PHARMD      . loperamide (IMODIUM) capsule 4 mg  4 mg Oral Once PRN Eudelia Bunch, PHARMD      . montelukast (SINGULAIR) tablet 10 mg  10 mg Oral QHS Eudelia Bunch, PHARMD   10 mg at 06/28/11 2200  . morphine 4 MG/ML injection 3 mg  3 mg Intravenous Q1H PRN Eudelia Bunch, PHARMD      . multivitamins ther. w/minerals tablet 1 tablet  1 tablet Oral Daily Eudelia Bunch, PHARMD      . nitroGLYCERIN (NITROSTAT) SL tablet 0.4 mg  0.4 mg Sublingual Q5  Min x 3 PRN Eudelia Bunch, PHARMD      . nitroGLYCERIN 0.2 mg/mL in dextrose 5 % infusion  2-200 mcg/min Intravenous Titrated Eudelia Bunch, PHARMD      . ondansetron (ZOFRAN) injection 4 mg  4 mg Intravenous Q6H PRN Eudelia Bunch, PHARMD      . oxyCODONE-acetaminophen (PERCOCET) 5-325 MG per tablet 1 tablet  1 tablet Oral Q6H PRN Eudelia Bunch, PHARMD      . pantoprazole (PROTONIX) EC tablet 40 mg  40 mg Oral Q1200 Eudelia Bunch, PHARMD      . polyethylene glycol (MIRALAX / GLYCOLAX) packet 17 g  17 g Oral Daily Eudelia Bunch, PHARMD      . pramoxine-mineral oil-zinc (TUCK'S) rectal ointment 1 application  1 application Rectal TID PRN Eudelia Bunch, PHARMD      . rosuvastatin (CRESTOR) tablet 40 mg  40 mg Oral q1800 Eudelia Bunch, PHARMD      . Ticagrelor (BRILINTA) tablet 90 mg  90 mg Oral BID Eudelia Bunch, PHARMD   90 mg at 06/28/11 2200  . traMADol (ULTRAM) tablet 50 mg  50 mg  Oral Q6H PRN Eudelia Bunch, PHARMD      . valsartan (DIOVAN) 80 MG tablet 40 mg  40 mg Oral BID Eudelia Bunch, PHARMD      . zolpidem (AMBIEN) tablet 5 mg  5 mg Oral QHS PRN Eudelia Bunch, PHARMD      . DISCONTD: albuterol-ipratropium (COMBIVENT) inhaler 2 puff  2 puff Inhalation TID Eudelia Bunch, PHARMD      . DISCONTD: albuterol-ipratropium (COMBIVENT) inhaler 2 puff  2 puff Inhalation TID PRN Eudelia Bunch, PHARMD      . DISCONTD: levalbuterol Penne Lash) nebulizer solution 1.25 mg  1.25 mg Nebulization Q6H Eudelia Bunch, PHARMD      . DISCONTD: metoprolol tartrate (LOPRESSOR) tablet 25 mg  25 mg Oral BID Eudelia Bunch, PHARMD        Physical Exam: General appearance: alert Neck: no adenopathy, no carotid bruit, no JVD, supple, symmetrical, trachea midline and thyroid not enlarged, symmetric, no tenderness/mass/nodules Lungs: wheezes bilaterally which are end-expiratory Heart: regular rate and rhythm, S1, S2 normal, no murmur, click, rub or gallop Abdomen: abnormal findings: There is a large right groin hematoma Extremities: extremities normal, atraumatic, no cyanosis or edema Pulses: 2+ and symmetric Skin: Skin color, texture, turgor normal. No rashes or lesions Neurologic: Grossly normal  Lab Results: Results for orders placed during the hospital encounter of 06/21/11 (from the past 48 hour(s))  POCT I-STAT 4, (NA,K, GLUC, HGB,HCT)     Status: Abnormal   Collection Time   06/27/11  2:59 PM      Component Value Range Comment   Sodium 137  135 - 145 (mEq/L)    Potassium 4.4  3.5 - 5.1 (mEq/L)    Glucose, Bld 114 (*) 70 - 99 (mg/dL)    HCT 27.0 (*) 39.0 - 52.0 (%)    Hemoglobin 9.2 (*) 13.0 - 17.0 (g/dL)   HEMOGLOBIN AND HEMATOCRIT, BLOOD     Status: Abnormal   Collection Time   06/27/11  5:05 PM      Component Value Range Comment   Hemoglobin 8.4 (*) 13.0 - 17.0 (g/dL)    HCT 24.6 (*) 39.0 - 52.0 (%)   HEMOGLOBIN AND HEMATOCRIT, BLOOD     Status: Abnormal    Collection Time   06/28/11 12:49 AM  Component Value Range Comment   Hemoglobin 8.3 (*) 13.0 - 17.0 (g/dL)    HCT 24.2 (*) 39.0 - 52.0 (%)   CBC     Status: Abnormal   Collection Time   06/28/11  5:00 AM      Component Value Range Comment   WBC 11.3 (*) 4.0 - 10.5 (K/uL)    RBC 2.72 (*) 4.22 - 5.81 (MIL/uL)    Hemoglobin 8.6 (*) 13.0 - 17.0 (g/dL)    HCT 25.4 (*) 39.0 - 52.0 (%)    MCV 93.4  78.0 - 100.0 (fL)    MCH 31.6  26.0 - 34.0 (pg)    MCHC 33.9  30.0 - 36.0 (g/dL)    RDW 13.6  11.5 - 15.5 (%)    Platelets 180  150 - 400 (K/uL)   PROTIME-INR     Status: Abnormal   Collection Time   06/28/11  5:00 AM      Component Value Range Comment   Prothrombin Time 20.6 (*) 11.6 - 15.2 (seconds)    INR 1.73 (*) 0.00 - [id number]    BASIC METABOLIC PANEL     Status: Abnormal   Collection Time   06/28/11  5:00 AM      Component Value Range Comment   Sodium 138  135 - 145 (mEq/L)    Potassium 4.3  3.5 - 5.1 (mEq/L)    Chloride 104  96 - 112 (mEq/L)    CO2 26  19 - 32 (mEq/L)    Glucose, Bld 117 (*) 70 - 99 (mg/dL)    BUN 22  6 - 23 (mg/dL)    Creatinine, Ser 1.59 (*) 0.50 - 1.35 (mg/dL)    Calcium 8.4  8.4 - 10.5 (mg/dL)    GFR calc non Af Amer 41 (*) >90 (mL/min)    GFR calc Af Amer 48 (*) >90 (mL/min)   HEMOGLOBIN AND HEMATOCRIT, BLOOD     Status: Abnormal   Collection Time   06/28/11 11:56 AM      Component Value Range Comment   Hemoglobin 8.5 (*) 13.0 - 17.0 (g/dL)    HCT 25.5 (*) 39.0 - 52.0 (%)   CBC     Status: Abnormal   Collection Time   06/28/11  8:35 PM      Component Value Range Comment   WBC 11.6 (*) 4.0 - 10.5 (K/uL)    RBC 2.54 (*) 4.22 - 5.81 (MIL/uL)    Hemoglobin 8.1 (*) 13.0 - 17.0 (g/dL)    HCT 23.8 (*) 39.0 - 52.0 (%)    MCV 93.7  78.0 - 100.0 (fL)    MCH 31.9  26.0 - 34.0 (pg)    MCHC 34.0  30.0 - 36.0 (g/dL)    RDW 13.5  11.5 - 15.5 (%)    Platelets 206  150 - 400 (K/uL)   BASIC METABOLIC PANEL     Status: Abnormal   Collection Time   06/28/11  8:35 PM        Component Value Range Comment   Sodium 137  135 - 145 (mEq/L)    Potassium 4.1  3.5 - 5.1 (mEq/L)    Chloride 104  96 - 112 (mEq/L)    CO2 25  19 - 32 (mEq/L)    Glucose, Bld 109 (*) 70 - 99 (mg/dL)    BUN 24 (*) 6 - 23 (mg/dL)    Creatinine, Ser 1.72 (*) 0.50 - 1.35 (mg/dL)    Calcium 8.4  8.4 - 10.5 (  mg/dL)    GFR calc non Af Amer 37 (*) >90 (mL/min)    GFR calc Af Amer 43 (*) >90 (mL/min)   CBC     Status: Abnormal   Collection Time   06/29/11  3:00 AM      Component Value Range Comment   WBC 11.6 (*) 4.0 - 10.5 (K/uL)    RBC 2.52 (*) 4.22 - 5.81 (MIL/uL)    Hemoglobin 7.9 (*) 13.0 - 17.0 (g/dL)    HCT 23.6 (*) 39.0 - 52.0 (%)    MCV 93.7  78.0 - 100.0 (fL)    MCH 31.3  26.0 - 34.0 (pg)    MCHC 33.5  30.0 - 36.0 (g/dL)    RDW 13.6  11.5 - 15.5 (%)    Platelets 202  150 - 400 (K/uL)   BASIC METABOLIC PANEL     Status: Abnormal   Collection Time   06/29/11  3:00 AM      Component Value Range Comment   Sodium 139  135 - 145 (mEq/L)    Potassium 4.1  3.5 - 5.1 (mEq/L)    Chloride 105  96 - 112 (mEq/L)    CO2 25  19 - 32 (mEq/L)    Glucose, Bld 129 (*) 70 - 99 (mg/dL)    BUN 20  6 - 23 (mg/dL)    Creatinine, Ser 1.54 (*) 0.50 - 1.35 (mg/dL)    Calcium 8.1 (*) 8.4 - 10.5 (mg/dL)    GFR calc non Af Amer 43 (*) >90 (mL/min)    GFR calc Af Amer 50 (*) >90 (mL/min)     Imaging: Ct Abdomen Pelvis Wo Contrast  06/29/2011  *RADIOLOGY REPORT*  Clinical Data: Large hematoma at the site of vascular access for an interventional procedure on 06/21/2011. Low hemoglobin. Hypotension.  Clinical concern for retroperitoneal hemorrhage.  CT ABDOMEN AND PELVIS WITHOUT CONTRAST  Technique:  Multidetector CT imaging of the abdomen and pelvis was performed following the standard protocol without intravenous contrast.  Comparison: CT urogram dated 09/02/2007 and 08/10/2004.  Findings: An intramuscular hematoma is noted in the anterior aspect of the proximal right thigh, medially.  On the most  inferior image, this measures 6.5 x 5.5 cm in maximum dimensions.  The inferior extent of the hematoma is not included on the images.  There is also some soft tissue stranding in the subcutaneous fat at that level and proximal to that level.  Slightly more superiorly, anterior to the common femoral vessels, a more discrete oval fluid collection with central low density and a peripheral higher density rim is demonstrated.  This measures 2.0 x 3.1 cm in maximum dimensions on image number 90.  Prostate radiation seed implants are noted.  Also noted are multiple sigmoid and descending colon diverticula.  There is also a small amount of anterior pericardial fluid, measuring 1.9 cm in maximum thickness.  Mild linear atelectasis or scarring at the lung bases. Unremarkable noncontrasted appearance of the liver, pancreas, gallbladder, adrenal glands, right kidney and urinary bladder. Small left renal calculi are noted.  The largest is in the mid left kidney, measuring 5 mm in maximum diameter.  Also noted are multiple small calcified splenic granulomata.  Lumbar and lower thoracic spine degenerative changes.  IMPRESSION:  1.  Intramuscular hematoma in the proximal right thigh anteromedially. 2.  3.1 cm oval fluid collection anterior to the common femoral vessels on the right.  This could represent a more liquified hematoma or small developing abscess. 3.  Subcutaneous  edema at the level of the right groin. 4.  No retroperitoneal hemorrhage. 5.  Left nephrolithiasis. 6.  Colonic diverticulosis. 7.  Small pericardial effusion.  Original Report Authenticated By: Gerald Stabs, M.D.   Dg Chest Portable 1 View  06/28/2011  *RADIOLOGY REPORT*  Clinical Data: Myocardial infarction  PORTABLE CHEST - 1 VIEW  Comparison: 06/23/2011  Findings: Artifact overlies the chest.  Heart size is normal.  The aorta shows calcification and unfolding.  There are chronically prominent interstitial markings. Calcified granuloma chronically seen  in the right midlung.  No evidence of pulmonary edema.  No effusions.  Bony structures are unremarkable.  IMPRESSION: Chronic interstitial markings.  No active disease evident.  Original Report Authenticated By: Jules Schick, M.D.    Assessment:  1. Active Problems: 2.  DYSLIPIDEMIA 3.  HYPERTENSION 4.  CAD 5.  CARDIOMYOPATHY, ISCHEMIC 6.  Shortness of breath 7.  Groin hematoma 8.  Acute blood loss anemia 9.  ST elevation (STEMI) myocardial infarction involving left anterior descending coronary artery 10.  Post-infarction apical thrombus 11.  Acute on chronic systolic congestive heart failure 12.  CKD (chronic kidney disease) stage 3, GFR 30-59 ml/min 13.  COPD (chronic obstructive pulmonary disease) 14. Prostate cancer  Plan:  1. DC imdur due to hypotension 2. Transfuse 2 units PRBC's today.     Time Spent Directly with Patient:  30 minutes  Length of Stay:  LOS: 8 days    HILTY,K.CHAD 06/29/2011, 9:42 AM

## 2011-06-30 LAB — CROSSMATCH: Unit division: 0

## 2011-06-30 LAB — CBC
HCT: 33 % — ABNORMAL LOW (ref 39.0–52.0)
Hemoglobin: 11.2 g/dL — ABNORMAL LOW (ref 13.0–17.0)
MCH: 31 pg (ref 26.0–34.0)
MCV: 91.4 fL (ref 78.0–100.0)
RBC: 3.61 MIL/uL — ABNORMAL LOW (ref 4.22–5.81)

## 2011-06-30 LAB — BASIC METABOLIC PANEL
CO2: 27 mEq/L (ref 19–32)
Calcium: 8.7 mg/dL (ref 8.4–10.5)
Creatinine, Ser: 1.48 mg/dL — ABNORMAL HIGH (ref 0.50–1.35)
Glucose, Bld: 154 mg/dL — ABNORMAL HIGH (ref 70–99)

## 2011-06-30 MED ORDER — METOPROLOL TARTRATE 12.5 MG HALF TABLET
12.5000 mg | ORAL_TABLET | Freq: Two times a day (BID) | ORAL | Status: DC
  Administered 2011-06-30 – 2011-07-04 (×8): 12.5 mg via ORAL
  Filled 2011-06-30 (×11): qty 1

## 2011-06-30 NOTE — Progress Notes (Signed)
CARDIAC REHAB PHASE I   PRE:  Rate/Rhythm: 95SR  BP:  Supine:   Sitting: 105/68  Standing:    SaO2: 97%RA  MODE:  Ambulation: 270 ft   POST:  Rate/Rhythem: 113ST  BP:  Supine:   Sitting: 115/85  Standing:    SaO2: 93-97%2L 1135-1210 Pt walked 270 ft with rolling walker on O2 at 2L and asst x 2.  Tolerated well. Not as SOB. Stopped once to rest. To recliner with call light. Left on RA at pt's request while sitting. [name]

## 2011-06-30 NOTE — Progress Notes (Addendum)
Subjective:  No new events overnight. Pain is improved. He is s/p 2U PRBC transfusion. Remains tachycardic. Repeat CBC pending.  Objective:  Temp:  [97.4 F (36.3 C)-99.3 F (37.4 C)] 98.9 F (37.2 C) (11/05 0800) Pulse Rate:  [89-111] 89  (11/05 0500) Resp:  [16-32] 21  (11/05 0500) BP: (80-122)/(52-85) 109/75 mmHg (11/05 0500) SpO2:  [90 %-100 %] 94 % (11/05 0500) Weight change:   Intake/Output from previous day: 11/04 0701 - 11/05 0700 In: 1587.1 [P.O.:720; I.V.:95; Blood:772.1] Out: 3950 [Urine:3950]  Intake/Output from this shift:    Medications: Current Facility-Administered Medications  Medication Dose Route Frequency Provider Last Rate Last Dose  . acetaminophen (TYLENOL) tablet 650 mg  650 mg Oral Q4H PRN Eudelia Bunch, PHARMD      . ALPRAZolam Duanne Moron) tablet 0.25 mg  0.25 mg Oral BID PRN Eudelia Bunch, PHARMD   0.25 mg at 06/29/11 2216  . alum & mag hydroxide-simeth (MAALOX/MYLANTA) 200-200-20 MG/5ML suspension 15-30 mL  15-30 mL Oral Q2H PRN Eudelia Bunch, PHARMD      . aspirin EC tablet 81 mg  81 mg Oral Daily Eudelia Bunch, PHARMD   81 mg at 06/29/11 1013  . carisoprodol (SOMA) tablet 350 mg  350 mg Oral TID PRN Eudelia Bunch, PHARMD      . diphenhydrAMINE (BENADRYL) capsule 25 mg  25 mg Oral Q4H PRN Eudelia Bunch, PHARMD      . guaiFENesin-dextromethorphan (ROBITUSSIN DM) 100-10 MG/5ML syrup 15 mL  15 mL Oral Q4H PRN Eudelia Bunch, PHARMD      . HYDROcodone-acetaminophen (NORCO) 5-325 MG per tablet 2 tablet  2 tablet Oral TID PRN Eudelia Bunch, PHARMD      . hydroxypropyl methylcellulose (ISOPTO TEARS) 2.5 % ophthalmic solution 1 drop  1 drop Both Eyes BID Eudelia Bunch, PHARMD   1 drop at 06/29/11 2200  . ketorolac (TORADOL) 30 MG/ML injection 30 mg  30 mg Intravenous Once Amy L. Grandville Silos, PA      . levalbuterol [facility]) nebulizer solution 0.63 mg  0.63 mg Nebulization Q6H PRN Eudelia Bunch, PHARMD      . levalbuterol Penne Lash) nebulizer  solution 1.25 mg  1.25 mg Nebulization Q6H Thomas A Kelly   1.25 mg at 06/30/11 0200  . loperamide (IMODIUM) capsule 2 mg  2 mg Oral PRN Eudelia Bunch, PHARMD      . montelukast (SINGULAIR) tablet 10 mg  10 mg Oral QHS Eudelia Bunch, PHARMD   10 mg at 06/29/11 2214  . morphine 4 MG/ML injection 3 mg  3 mg Intravenous Q1H PRN Eudelia Bunch, PHARMD      . multivitamins ther. w/minerals tablet 1 tablet  1 tablet Oral Daily Eudelia Bunch, PHARMD   1 tablet at 06/29/11 1000  . nitroGLYCERIN (NITROSTAT) SL tablet 0.4 mg  0.4 mg Sublingual Q5 Min x 3 PRN Eudelia Bunch, PHARMD      . ondansetron (ZOFRAN) injection 4 mg  4 mg Intravenous Q6H PRN Eudelia Bunch, PHARMD      . oxyCODONE-acetaminophen (PERCOCET) 5-325 MG per tablet 1 tablet  1 tablet Oral Q6H PRN Eudelia Bunch, PHARMD      . pantoprazole (PROTONIX) EC tablet 40 mg  40 mg Oral Q1200 Eudelia Bunch, PHARMD   40 mg at 06/29/11 1227  . polyethylene glycol (MIRALAX / GLYCOLAX) packet 17 g  17 g Oral Daily Eudelia Bunch, PHARMD   17 g at 06/29/11  1015  . pramoxine-mineral oil-zinc (TUCK'S) rectal ointment 1 application  1 application Rectal TID PRN Eudelia Bunch, PHARMD      . rosuvastatin (CRESTOR) tablet 40 mg  40 mg Oral q1800 Eudelia Bunch, PHARMD   40 mg at 06/29/11 1800  . Ticagrelor (BRILINTA) tablet 90 mg  90 mg Oral BID Eudelia Bunch, PHARMD   90 mg at 06/29/11 2214  . traMADol (ULTRAM) tablet 50 mg  50 mg Oral Q6H PRN Eudelia Bunch, PHARMD      . valsartan (DIOVAN) 80 MG tablet 40 mg  40 mg Oral BID Eudelia Bunch, PHARMD   40 mg at 06/29/11 2215  . zolpidem (AMBIEN) tablet 5 mg  5 mg Oral QHS PRN Eudelia Bunch, PHARMD      . DISCONTD: 0.9 %  sodium chloride infusion   Intravenous Continuous Amy L. Thompson, PA 75 mL/hr at 06/29/11 0300    . DISCONTD: isosorbide mononitrate (IMDUR) 24 hr tablet 30 mg  30 mg Oral Daily Eudelia Bunch, PHARMD      . DISCONTD: nitroGLYCERIN 0.2 mg/mL in dextrose 5 % infusion  2-200  mcg/min Intravenous Titrated Eudelia Bunch, PHARMD        Physical Exam: General appearance: alert and no distress Neck: no adenopathy, no carotid bruit, no JVD, supple, symmetrical, trachea midline and thyroid not enlarged, symmetric, no tenderness/mass/nodules Lungs: clear to auscultation bilaterally Heart: regular rate and rhythm, S1, S2 normal, no murmur, click, rub or gallop Abdomen: soft, non-tender; bowel sounds normal; no masses,  no organomegaly Extremities: Large right groin hematoma Pulses: 2+ and symmetric Skin: Skin color, texture, turgor normal. No rashes or lesions Neurologic: Grossly normal  Lab Results: Results for orders placed during the hospital encounter of 06/21/11 (from the past 48 hour(s))  HEMOGLOBIN AND HEMATOCRIT, BLOOD     Status: Abnormal   Collection Time   06/28/11 11:56 AM      Component Value Range Comment   Hemoglobin 8.5 (*) 13.0 - 17.0 (g/dL)    HCT 25.5 (*) 39.0 - 52.0 (%)   CBC     Status: Abnormal   Collection Time   06/28/11  8:35 PM      Component Value Range Comment   WBC 11.6 (*) 4.0 - 10.5 (K/uL)    RBC 2.54 (*) 4.22 - 5.81 (MIL/uL)    Hemoglobin 8.1 (*) 13.0 - 17.0 (g/dL)    HCT 23.8 (*) 39.0 - 52.0 (%)    MCV 93.7  78.0 - 100.0 (fL)    MCH 31.9  26.0 - 34.0 (pg)    MCHC 34.0  30.0 - 36.0 (g/dL)    RDW 13.5  11.5 - 15.5 (%)    Platelets 206  150 - 400 (K/uL)   BASIC METABOLIC PANEL     Status: Abnormal   Collection Time   06/28/11  8:35 PM      Component Value Range Comment   Sodium 137  135 - 145 (mEq/L)    Potassium 4.1  3.5 - 5.1 (mEq/L)    Chloride 104  96 - 112 (mEq/L)    CO2 25  19 - 32 (mEq/L)    Glucose, Bld 109 (*) 70 - 99 (mg/dL)    BUN 24 (*) 6 - 23 (mg/dL)    Creatinine, Ser 1.72 (*) 0.50 - 1.35 (mg/dL)    Calcium 8.4  8.4 - 10.5 (mg/dL)    GFR calc non Af Amer 37 (*) >90 (mL/min)  GFR calc Af Amer 43 (*) >90 (mL/min)   POCT I-STAT 3, BLOOD GAS (G3+)     Status: Abnormal   Collection Time   06/28/11  9:24 PM        Component Value Range Comment   pH, Arterial 7.436  7.350 - 7.450     pCO2 arterial 34.6 (*) 35.0 - 45.0 (mmHg)    pO2, Arterial 72.0 (*) 80.0 - 100.0 (mmHg)    Bicarbonate 23.3  20.0 - 24.0 (mEq/L)    TCO2 24  0 - 100 (mmol/L)    O2 Saturation 95.0      Acid-base deficit 1.0  0.0 - 2.0 (mmol/L)    Collection site RADIAL, ALLEN'S TEST ACCEPTABLE      Drawn by RT      Sample type ARTERIAL     CBC     Status: Abnormal   Collection Time   06/29/11  3:00 AM      Component Value Range Comment   WBC 11.6 (*) 4.0 - 10.5 (K/uL)    RBC 2.52 (*) 4.22 - 5.81 (MIL/uL)    Hemoglobin 7.9 (*) 13.0 - 17.0 (g/dL)    HCT 23.6 (*) 39.0 - 52.0 (%)    MCV 93.7  78.0 - 100.0 (fL)    MCH 31.3  26.0 - 34.0 (pg)    MCHC 33.5  30.0 - 36.0 (g/dL)    RDW 13.6  11.5 - 15.5 (%)    Platelets 202  150 - 400 (K/uL)   BASIC METABOLIC PANEL     Status: Abnormal   Collection Time   06/29/11  3:00 AM      Component Value Range Comment   Sodium 139  135 - 145 (mEq/L)    Potassium 4.1  3.5 - 5.1 (mEq/L)    Chloride 105  96 - 112 (mEq/L)    CO2 25  19 - 32 (mEq/L)    Glucose, Bld 129 (*) 70 - 99 (mg/dL)    BUN 20  6 - 23 (mg/dL)    Creatinine, Ser 1.54 (*) 0.50 - 1.35 (mg/dL)    Calcium 8.1 (*) 8.4 - 10.5 (mg/dL)    GFR calc non Af Amer 43 (*) >90 (mL/min)    GFR calc Af Amer 50 (*) >90 (mL/min)     Imaging: Ct Abdomen Pelvis Wo Contrast  06/29/2011  *RADIOLOGY REPORT*  Clinical Data: Large hematoma at the site of vascular access for an interventional procedure on 06/21/2011. Low hemoglobin. Hypotension.  Clinical concern for retroperitoneal hemorrhage.  CT ABDOMEN AND PELVIS WITHOUT CONTRAST  Technique:  Multidetector CT imaging of the abdomen and pelvis was performed following the standard protocol without intravenous contrast.  Comparison: CT urogram dated 09/02/2007 and 08/10/2004.  Findings: An intramuscular hematoma is noted in the anterior aspect of the proximal right thigh, medially.  On the most  inferior image, this measures 6.5 x 5.5 cm in maximum dimensions.  The inferior extent of the hematoma is not included on the images.  There is also some soft tissue stranding in the subcutaneous fat at that level and proximal to that level.  Slightly more superiorly, anterior to the common femoral vessels, a more discrete oval fluid collection with central low density and a peripheral higher density rim is demonstrated.  This measures 2.0 x 3.1 cm in maximum dimensions on image number 90.  Prostate radiation seed implants are noted.  Also noted are multiple sigmoid and descending colon diverticula.  There is also a small amount  of anterior pericardial fluid, measuring 1.9 cm in maximum thickness.  Mild linear atelectasis or scarring at the lung bases. Unremarkable noncontrasted appearance of the liver, pancreas, gallbladder, adrenal glands, right kidney and urinary bladder. Small left renal calculi are noted.  The largest is in the mid left kidney, measuring 5 mm in maximum diameter.  Also noted are multiple small calcified splenic granulomata.  Lumbar and lower thoracic spine degenerative changes.  IMPRESSION:  1.  Intramuscular hematoma in the proximal right thigh anteromedially. 2.  3.1 cm oval fluid collection anterior to the common femoral vessels on the right.  This could represent a more liquified hematoma or small developing abscess. 3.  Subcutaneous edema at the level of the right groin. 4.  No retroperitoneal hemorrhage. 5.  Left nephrolithiasis. 6.  Colonic diverticulosis. 7.  Small pericardial effusion.  Original Report Authenticated By: Gerald Stabs, M.D.   Dg Chest Portable 1 View  06/28/2011  *RADIOLOGY REPORT*  Clinical Data: Myocardial infarction  PORTABLE CHEST - 1 VIEW  Comparison: 06/23/2011  Findings: Artifact overlies the chest.  Heart size is normal.  The aorta shows calcification and unfolding.  There are chronically prominent interstitial markings. Calcified granuloma chronically seen  in the right midlung.  No evidence of pulmonary edema.  No effusions.  Bony structures are unremarkable.  IMPRESSION: Chronic interstitial markings.  No active disease evident.  Original Report Authenticated By: Jules Schick, M.D.    Assessment:  1. Active Problems: 2.  DYSLIPIDEMIA 3.  HYPERTENSION 4.  CAD 5.  CARDIOMYOPATHY, ISCHEMIC 6.  Shortness of breath 7.  Groin hematoma 8.  Acute blood loss anemia 9.  ST elevation (STEMI) myocardial infarction involving left anterior descending coronary artery 10.  Post-infarction apical thrombus 11.  Acute on chronic systolic congestive heart failure 12.  CKD (chronic kidney disease) stage 3, GFR 30-59 ml/min 13.  COPD (chronic obstructive pulmonary disease) 14.  Prostate cancer 15.   Plan:  1. Improved after transfusion. Await repeat CBC 2. Start low dose metoprolol 12.5 mg po BID, hold for SBP<90, P<50. 3. Ambulate with cardiac rehab today.  Time Spent Directly with Patient:  15 minutes  Length of Stay:  LOS: 9 days    Myrel Rappleye C 06/30/2011, 8:47 AM

## 2011-06-30 NOTE — Progress Notes (Signed)
ANTICOAGULATION CONSULT NOTE - Follow Up Consult  Pharmacy Consult for warfarin Indication: Apical Thrombus post MI  No Known Allergies  Vital Signs: Temp: 98.9 F (37.2 C) (11/05 0800) Temp src: Oral (11/05 0800) BP: 115/85 mmHg (11/05 1200) Pulse Rate: 108  (11/05 1200)  Labs:  Basename 06/30/11 0949 06/29/11 0300 06/28/11 2035 06/28/11 0500  HGB 11.2* 7.9* -- --  HCT 33.0* 23.6* 23.8* --  PLT 261 202 206 --  APTT -- -- -- --  LABPROT -- -- -- 20.6*  INR -- -- -- 1.73*  HEPARINUNFRC -- -- -- --  CREATININE 1.48* 1.54* 1.72* --  CKTOTAL -- -- -- --  CKMB -- -- -- --  TROPONINI -- -- -- --   Estimated Creatinine Clearance: 48.6 ml/min (by C-G formula based on Cr of 1.48).   Medications:  Prescriptions prior to admission  Medication Sig Dispense Refill  . albuterol (PROVENTIL) (2.5 MG/3ML) 0.083% nebulizer solution Take 2.5 mg by nebulization 3 (three) times daily as needed. For coughing       . albuterol-ipratropium (COMBIVENT) 18-103 MCG/ACT inhaler Inhale 2 puffs into the lungs 3 (three) times daily as needed. For coughing       . aspirin EC 325 MG tablet Take 325 mg by mouth daily.        . carisoprodol (SOMA) 350 MG tablet Take 350 mg by mouth 3 (three) times daily as needed. For back pain       . clopidogrel (PLAVIX) 75 MG tablet Take 75 mg by mouth daily.        Marland Kitchen HYDROcodone-acetaminophen (LORCET) 10-650 MG per tablet Take 1 tablet by mouth 3 (three) times daily as needed. For pain       . lisinopril (PRINIVIL,ZESTRIL) 5 MG tablet Take 5 mg by mouth 2 (two) times daily.        . metoprolol (LOPRESSOR) 50 MG tablet Take 50 mg by mouth 2 (two) times daily.        . montelukast (SINGULAIR) 10 MG tablet Take 10 mg by mouth daily.        . Multiple Vitamins-Minerals (MULTIVITAMINS THER. W/MINERALS) TABS Take 0.5 tablets by mouth 2 (two) times daily.        . nitroGLYCERIN (NITROSTAT) 0.4 MG SL tablet Place 0.4 mg under the tongue every 5 (five) minutes as needed. For  chest pain       . omeprazole (PRILOSEC) 20 MG capsule Take 20 mg by mouth daily.        Marland Kitchen OVER THE COUNTER MEDICATION Place 1 drop into both eyes 2 (two) times daily. Saline Eye drops       . simvastatin (ZOCOR) 80 MG tablet Take 80 mg by mouth daily.         Scheduled:    . aspirin EC  81 mg Oral Daily  . hydroxypropyl methylcellulose  1 drop Both Eyes BID  . ketorolac  30 mg Intravenous Once  . levalbuterol  1.25 mg Nebulization Q6H  . metoprolol tartrate  12.5 mg Oral BID  . montelukast  10 mg Oral QHS  . multivitamins ther. w/minerals  1 tablet Oral Daily  . pantoprazole  40 mg Oral Q1200  . polyethylene glycol  17 g Oral Daily  . rosuvastatin  40 mg Oral q1800  . Ticagrelor  90 mg Oral BID  . valsartan  40 mg Oral BID    Assessment: 76 yo M s/p STEMI with apical thrombus and subsequent post procedure hematoma, now with  anticoagulation on hold, post 2 units PRBCs  Goal of Therapy:  INR 2-3 once anticoagulation resumed   Plan:  Apical thrombus:  Follow up for MD order to resume anticoagulation.  CBC much improved post PRBCs, BP stable, HR in the 100s, Continue to follow CBC and daily INR. STEMI:  Currently treated with aspirin, ticagrelor, crestor, metoprolol, valsartan.   HF, EF 35-40%:  Currently treated with valsartan, and metoprolol, consider changing to Toprol XL prior to discharge.   [place] 06/30/2011,2:36 PM

## 2011-07-01 LAB — BASIC METABOLIC PANEL
BUN: 22 mg/dL (ref 6–23)
CO2: 25 mEq/L (ref 19–32)
Calcium: 9.1 mg/dL (ref 8.4–10.5)
Creatinine, Ser: 1.57 mg/dL — ABNORMAL HIGH (ref 0.50–1.35)

## 2011-07-01 LAB — CBC
MCH: 30.7 pg (ref 26.0–34.0)
MCV: 92 fL (ref 78.0–100.0)
Platelets: 295 10*3/uL (ref 150–400)
RDW: 15.1 % (ref 11.5–15.5)

## 2011-07-01 MED ORDER — POLYVINYL ALCOHOL 1.4 % OP SOLN
1.0000 [drp] | Freq: Two times a day (BID) | OPHTHALMIC | Status: DC
  Administered 2011-07-02 – 2011-07-04 (×5): 1 [drp] via OPHTHALMIC
  Filled 2011-07-01: qty 15

## 2011-07-01 NOTE — Progress Notes (Signed)
UR Completed.  [name] 07/01/2011

## 2011-07-01 NOTE — Progress Notes (Signed)
ANTICOAGULATION CONSULT NOTE - Follow Up Consult  Pharmacy Consult for Coumadin Indication: Apical thrombus  No Known Allergies  Patient Measurements: Height: 5\' 7"  (170.2 cm) Weight: 205 lb 11 oz (93.3 kg) IBW/kg (Calculated) : 66.1   Vital Signs: Temp: 98.2 F (36.8 C) (11/06 1200) Temp src: Oral (11/06 0800) BP: 90/78 mmHg (11/06 1514) Pulse Rate: 102  (11/06 1514)  Labs:  Basename 07/01/11 0902 06/30/11 0949 06/29/11 0300  HGB 11.1* 11.2* --  HCT 33.2* 33.0* 23.6*  PLT 295 261 202  APTT -- -- --  LABPROT -- -- --  INR -- -- --  HEPARINUNFRC -- -- --  CREATININE 1.57* 1.48* 1.54*  CKTOTAL -- -- --  CKMB -- -- --  TROPONINI -- -- --   Estimated Creatinine Clearance: 45 ml/min (by C-G formula based on Cr of 1.57).   Medications:  Scheduled:    . aspirin EC  81 mg Oral Daily  . ketorolac  30 mg Intravenous Once  . levalbuterol  1.25 mg Nebulization Q6H  . metoprolol tartrate  12.5 mg Oral BID  . montelukast  10 mg Oral QHS  . multivitamins ther. w/minerals  1 tablet Oral Daily  . pantoprazole  40 mg Oral Q1200  . polyethylene glycol  17 g Oral Daily  . polyvinyl alcohol  1 drop Both Eyes BID  . rosuvastatin  40 mg Oral q1800  . Ticagrelor  90 mg Oral BID  . valsartan  40 mg Oral BID  . DISCONTD: hydroxypropyl methylcellulose  1 drop Both Eyes BID   PRN: acetaminophen, ALPRAZolam, alum & mag hydroxide-simeth, carisoprodol, diphenhydrAMINE, guaiFENesin-dextromethorphan, HYDROcodone-acetaminophen, levalbuterol, loperamide, morphine injection, nitroGLYCERIN, ondansetron, oxyCODONE-acetaminophen, pramoxine-mineral oil-zinc, traMADol, zolpidem  Assessment: 40 yom with post infarction apical thrombus complicated by groin hematoma. Patient ambulated and will be transferred out of the unit, hematoma appears stable. Will follow along for plan Goal of Therapy:  INR 2-3   Plan:  Will follow along for anticoagulation plan.  Georgina Peer 07/01/2011,3:43  PM

## 2011-07-01 NOTE — Progress Notes (Signed)
[facility] DAILY PROGRESS NOTE  SI SNIDER   [id number] Aug 23, 1935     Subjective:  Feels better  Objective:  Temp:  [98.3 F (36.8 C)-99.5 F (37.5 C)] 99.5 F (37.5 C) (11/06 0400) Pulse Rate:  [102-119] 119  (11/05 1547) Resp:  [18] 18  (11/05 1002) BP: (105-115)/(68-85) 105/68 mmHg (11/05 1547) SpO2:  [95 %-99 %] 99 % (11/05 2023) Weight:  [93.3 kg (205 lb 11 oz)] 205 lb 11 oz (93.3 kg) (11/06 0400) Weight change:   Intake/Output from previous day: 11/05 0701 - 11/06 0700 In: -  Out: 1100 [Urine:1100] Intake/Output from this shift:    Physical Exam: General appearance: alert, cooperative and no distress Lungs: clear to auscultation bilaterally Heart: regular rate and rhythm, S1, S2 normal, no murmur, click, rub or gallop Extremities: Tender right groin 2+ right pedal pulse  Lab Results: Results for orders placed during the hospital encounter of 06/21/11 (from the past 48 hour(s))  CBC     Status: Abnormal   Collection Time   06/30/11  9:49 AM      Component Value Range Comment   WBC 11.6 (*) 4.0 - 10.5 (K/uL)    RBC 3.61 (*) 4.22 - 5.81 (MIL/uL)    Hemoglobin 11.2 (*) 13.0 - 17.0 (g/dL)    HCT 33.0 (*) 39.0 - 52.0 (%)    MCV 91.4  78.0 - 100.0 (fL)    MCH 31.0  26.0 - 34.0 (pg)    MCHC 33.9  30.0 - 36.0 (g/dL)    RDW 15.9 (*) 11.5 - 15.5 (%)    Platelets 261  150 - 400 (K/uL)   BASIC METABOLIC PANEL     Status: Abnormal   Collection Time   06/30/11  9:49 AM      Component Value Range Comment   Sodium 138  135 - 145 (mEq/L)    Potassium 4.5  3.5 - 5.1 (mEq/L)    Chloride 104  96 - 112 (mEq/L)    CO2 27  19 - 32 (mEq/L)    Glucose, Bld 154 (*) 70 - 99 (mg/dL)    BUN 18  6 - 23 (mg/dL)    Creatinine, Ser 1.48 (*) 0.50 - 1.35 (mg/dL)    Calcium 8.7  8.4 - 10.5 (mg/dL)    GFR calc non Af Amer 45 (*) >90 (mL/min)    GFR calc Af Amer 52 (*) >90 (mL/min)     Imaging:   Echo: Cath:  NST:       Assessment/Plan:    Active Problems:  DYSLIPIDEMIA  HYPERTENSION  CAD  CARDIOMYOPATHY, ISCHEMIC  Shortness of breath  Groin hematoma  Acute blood loss anemia  ST elevation (STEMI) myocardial infarction involving left anterior descending coronary artery  Post-infarction apical thrombus  Acute on chronic systolic congestive heart failure  CKD (chronic kidney disease) stage 3, GFR 30-59 ml/min  COPD (chronic obstructive pulmonary disease)  Prostate cancer   PLAN Continue to monitor CBC, BP, HR. Transfer to tele.  Time Spent Directly with Patient:    Length of Stay:  LOS: 10 days    HAGER,BRYAN W PA-C 07/01/2011, 8:58 AM     Agree with note written by Luisa Dago [facility]  STEMI on 10/27. No CP or SOB. Major issue is groin hematoma but no RPH. Hgb stable. Pt has been ambulating without difficulty. Will transfer to tele. [facility]. 2D echo with definity on 10/29 showed mobil apical mural thrombus. Issue will be when to  initiate coumadin a/c in light of groin hematoma. Will discuss with Dr. [name].  Lorretta Harp 07/01/2011 9:10 AM

## 2011-07-01 NOTE — Progress Notes (Signed)
CARDIAC REHAB PHASE I   PRE:  Rate/Rhythm: 94 sinus rhythm  BP:  Supine:   Sitting: 90/78   Standing:    SaO2: 100% ra  MODE:  Ambulation: 350 ft   POST:  Rate/Rhythem: 111 st  BP:  Supine:   Sitting: 104/75  Standing:    SaO2: 96% 2L   Pt tolerated ambulation well with rolling walker on 2L/[place] with assist x1.  Pt feels much better and wanting to walk more.   Returned to recliner, call light in reach.   11:40am-12:05pm  [place]

## 2011-07-02 ENCOUNTER — Encounter (HOSPITAL_COMMUNITY): Payer: Self-pay | Admitting: *Deleted

## 2011-07-02 DIAGNOSIS — I240 Acute coronary thrombosis not resulting in myocardial infarction: Secondary | ICD-10-CM

## 2011-07-02 LAB — CBC
MCHC: 33.2 g/dL (ref 30.0–36.0)
MCV: 92.3 fL (ref 78.0–100.0)
Platelets: 324 10*3/uL (ref 150–400)
RDW: 14.7 % (ref 11.5–15.5)
WBC: 14.7 10*3/uL — ABNORMAL HIGH (ref 4.0–10.5)

## 2011-07-02 MED ORDER — ENOXAPARIN SODIUM 100 MG/ML ~~LOC~~ SOLN
95.0000 mg | Freq: Two times a day (BID) | SUBCUTANEOUS | Status: DC
  Administered 2011-07-02 – 2011-07-04 (×4): 95 mg via SUBCUTANEOUS
  Filled 2011-07-02 (×7): qty 1

## 2011-07-02 MED ORDER — ENOXAPARIN (LOVENOX) PATIENT EDUCATION KIT
PACK | Freq: Once | Status: AC
  Administered 2011-07-02: 18:00:00
  Filled 2011-07-02: qty 1

## 2011-07-02 MED ORDER — WARFARIN SODIUM 5 MG PO TABS
5.0000 mg | ORAL_TABLET | ORAL | Status: AC
  Administered 2011-07-02: 5 mg via ORAL
  Filled 2011-07-02: qty 1

## 2011-07-02 NOTE — Progress Notes (Signed)
ANTICOAGULATION CONSULT NOTE - Follow Up Consult  Pharmacy Consult for lovenox/coumadin Indication: Apical Thrombus  No Known Allergies  Patient Measurements: Height: 5\' 7"  (170.2 cm) Weight: 205 lb 11 oz (93.3 kg) IBW/kg (Calculated) : 66.1  Adjusted Body Weight: N/A  Vital Signs: Temp: 98.1 F (36.7 C) (11/07 1300) Temp src: Oral (11/07 1300) BP: 103/71 mmHg (11/07 1300) Pulse Rate: 95  (11/07 1300)  Labs:  Basename 07/02/11 0500 07/01/11 0902 06/30/11 0949  HGB 10.8* 11.1* --  HCT 32.5* 33.2* 33.0*  PLT 324 295 261  APTT -- -- --  LABPROT -- -- --  INR -- -- --  HEPARINUNFRC -- -- --  CREATININE -- 1.57* 1.48*  CKTOTAL -- -- --  CKMB -- -- --  TROPONINI -- -- --   Estimated Creatinine Clearance: 45 ml/min (by C-G formula based on Cr of 1.57).   Medications:  Scheduled:    . aspirin EC  81 mg Oral Daily  . ketorolac  30 mg Intravenous Once  . levalbuterol  1.25 mg Nebulization Q6H  . metoprolol tartrate  12.5 mg Oral BID  . montelukast  10 mg Oral QHS  . multivitamins ther. w/minerals  1 tablet Oral Daily  . pantoprazole  40 mg Oral Q1200  . polyethylene glycol  17 g Oral Daily  . polyvinyl alcohol  1 drop Both Eyes BID  . rosuvastatin  40 mg Oral q1800  . Ticagrelor  90 mg Oral BID  . valsartan  40 mg Oral BID    Assessment: 76 yo male previously on heparin and Coumadin this admission, anticoagulation held due to groin hematoma, now stable, pharmacy asked to resume anticoagulation with Lovenox and Coumadin.  Goal of Therapy:  INR 2-3   Plan:  1. Coumadin 5 mg x1 tonight.  Daily PT/INR. 2. Lovenox 95 mg q12hrs.  CBC q72 hrs while on Lovenox. 3. Will start Coumadin education and Lovenox teaching.  Lenard Lance 07/02/2011,6:00 PM

## 2011-07-02 NOTE — Progress Notes (Addendum)
CARDIAC REHAB PHASE I   PRE:  Rate/Rhythm: 106 ST  BP:  Supine:   Sitting: 100/50  Standing:    SaO2: 92% RA  MODE:  Ambulation: 550 ft   POST:  Rate/Rhythem: 116 ST  BP:  Supine:   Sitting: 104/70  Standing:    SaO2: 98% 2L Pt. tolerated ambulation well using walker. Gait steady. DOE and has cough which he states is much improved. Pt.to recliner with callight in reach Deon Pilling     [phone]

## 2011-07-02 NOTE — Progress Notes (Signed)
This patient was discussed at long LOS rounds this morning.

## 2011-07-02 NOTE — Progress Notes (Signed)
Ambulated patient 550 feet with rolling walker, assist x 1. Tolerated well.  Vitals stable. Returned to bed.  Will continue to monitor and walk again later this evening.

## 2011-07-02 NOTE — Progress Notes (Signed)
[name] DAILY PROGRESS NOTE  [name]   [id number] 1935-07-06     Subjective:  Slept very well.  Ambulating without difficulty.  Breathing much better. No orthopnea.  Objective:  Temp:  [98.2 F (36.8 C)-99 F (37.2 C)] 98.6 F (37 C) (11/07 0300) Pulse Rate:  [49-102] 49  (11/07 0300) Resp:  [16-20] 19  (11/07 0300) BP: (87-116)/(59-80) 107/67 mmHg (11/07 0300) SpO2:  [91 %-97 %] 91 % (11/07 0855) Weight change:   Intake/Output from previous day: 11/06 0701 - 11/07 0700 In: 1200 [P.O.:1200] Out: 1850 [Urine:1850] Intake/Output from this shift: Total I/O In: 240 [P.O.:240] Out: 1 [Stool:1]  Physical Exam: General appearance: alert, cooperative and no distress Lungs: clear to auscultation bilaterally and BS slightly decreased Heart: regular rate and rhythm, S1, S2 normal, no murmur, click, rub or gallop Extremities: Trace LEE left Decreased ecchymosis and hematoma right groin.  Lab Results: Results for orders placed during the hospital encounter of 06/21/11 (from the past 48 hour(s))  CBC     Status: Abnormal   Collection Time   07/01/11  9:02 AM      Component Value Range Comment   WBC 13.2 (*) 4.0 - 10.5 (K/uL)    RBC 3.61 (*) 4.22 - 5.81 (MIL/uL)    Hemoglobin 11.1 (*) 13.0 - 17.0 (g/dL)    HCT 33.2 (*) 39.0 - 52.0 (%)    MCV 92.0  78.0 - 100.0 (fL)    MCH 30.7  26.0 - 34.0 (pg)    MCHC 33.4  30.0 - 36.0 (g/dL)    RDW 15.1  11.5 - 15.5 (%)    Platelets 295  150 - 400 (K/uL)   BASIC METABOLIC PANEL     Status: Abnormal   Collection Time   07/01/11  9:02 AM      Component Value Range Comment   Sodium 138  135 - 145 (mEq/L)    Potassium 4.6  3.5 - 5.1 (mEq/L)    Chloride 103  96 - 112 (mEq/L)    CO2 25  19 - 32 (mEq/L)    Glucose, Bld 132 (*) 70 - 99 (mg/dL)    BUN 22  6 - 23 (mg/dL)    Creatinine, Ser 1.57 (*) 0.50 - 1.35 (mg/dL)    Calcium 9.1  8.4 - 10.5 (mg/dL)    GFR calc non Af Amer 42 (*) >90 (mL/min)    GFR  calc Af Amer 48 (*) >90 (mL/min)   CBC     Status: Abnormal   Collection Time   07/02/11  5:00 AM      Component Value Range Comment   WBC 14.7 (*) 4.0 - 10.5 (K/uL)    RBC 3.52 (*) 4.22 - 5.81 (MIL/uL)    Hemoglobin 10.8 (*) 13.0 - 17.0 (g/dL)    HCT 32.5 (*) 39.0 - 52.0 (%)    MCV 92.3  78.0 - 100.0 (fL)    MCH 30.7  26.0 - 34.0 (pg)    MCHC 33.2  30.0 - 36.0 (g/dL)    RDW 14.7  11.5 - 15.5 (%)    Platelets 324  150 - 400 (K/uL)       Assessment/Plan:   Active Problems: ST elevation (STEMI) myocardial infarction involving left anterior descending coronary artery Apical thrombus Groin hematoma DYSLIPIDEMIA HYPERTENSION CAD CARDIOMYOPATHY, ISCHEMIC Shortness of breath Acute blood loss anemia Post-infarction apical thrombus Acute on chronic systolic congestive heart failure CKD (chronic kidney disease) stage 3, GFR 30-59  ml/min COPD (chronic obstructive pulmonary disease) Prostate cancer   PLAN:  BP stable and controlled.  Hr slow.  Question when to restart coumadin for apical thrombus in light of groin hematoma.  When to DC?  Time Spent Directly with Patient:    Length of Stay:  LOS: 11 days    Mason W PA-C 07/02/2011, 10:35 AM  Pt. Seen and examined. Agree with the NP/PA-C note as written. Agree .Marland Kitchen Would plan to restart coumadin tonight for LV apical thrombus. Need to arrange for lovenox per pharmacy and doses so that he can take at home for at least 5 days. Will need follow-up to check INR on Monday in our office. Anticipate possible d/c tomorrow after lovenox teaching.

## 2011-07-02 NOTE — Progress Notes (Deleted)
This patient was discussed at [place] rounds this morning.

## 2011-07-03 ENCOUNTER — Encounter: Payer: Self-pay | Admitting: *Deleted

## 2011-07-03 LAB — BASIC METABOLIC PANEL
CO2: 26 mEq/L (ref 19–32)
Chloride: 101 mEq/L (ref 96–112)
GFR calc Af Amer: 50 mL/min — ABNORMAL LOW (ref >90)
Potassium: 4.1 mEq/L (ref 3.5–5.1)
Sodium: 138 mEq/L (ref 135–145)

## 2011-07-03 LAB — CBC
Hemoglobin: 10.8 g/dL — ABNORMAL LOW (ref 13.0–17.0)
MCHC: 33.1 g/dL (ref 30.0–36.0)
Platelets: 345 10*3/uL (ref 150–400)
RDW: 14.4 % (ref 11.5–15.5)

## 2011-07-03 LAB — PROTIME-INR
INR: 1.27 (ref 0.00–1.49)
Prothrombin Time: 16.2 seconds — ABNORMAL HIGH (ref 11.6–15.2)

## 2011-07-03 MED ORDER — WARFARIN SODIUM 5 MG PO TABS
5.0000 mg | ORAL_TABLET | Freq: Once | ORAL | Status: AC
  Administered 2011-07-03: 5 mg via ORAL
  Filled 2011-07-03: qty 1

## 2011-07-03 MED ORDER — LEVALBUTEROL TARTRATE 45 MCG/ACT IN AERO
2.0000 | INHALATION_SPRAY | Freq: Four times a day (QID) | RESPIRATORY_TRACT | Status: DC | PRN
  Filled 2011-07-03: qty 15

## 2011-07-03 NOTE — Progress Notes (Signed)
CARDIAC REHAB PHASE I   PRE:  Rate/Rhythm: 101St  BP:  Supine: 110/60  Sitting:   Standing:    SaO2: 92%RA  MODE:  Ambulation: 850 ft   POST:  Rate/Rhythem: 122  BP:  Supine:   Sitting: 100/70  Standing:    SaO2: 91-95%2L  Pt walked 850 ft with walker. Tolerated well on O2 at 2L. Did not need to rest. Ed completed with pt and family. To chair after walk. Put on Coumadin video.  [id number]  [name]

## 2011-07-03 NOTE — Progress Notes (Signed)
CM called [facility] on [address] and they have 10 syringes of generic lovenox and 30 day supply of  brilinta available for pick up. Will make pt aware that both are available. CM did speak to Cecilie Kicks  NP and she will write for 30 day supply of brilinta. No other needs were addressed by CM. Bethena Roys 07/03/2011 12:08 PM

## 2011-07-03 NOTE — Progress Notes (Signed)
ANTICOAGULATION CONSULT NOTE - Follow Up Consult  Pharmacy Consult for lovenox/coumadin Indication: Apical Thrombus  No Known Allergies  Patient Measurements: Height: 5\' 7"  (170.2 cm) Weight: 205 lb 11 oz (93.3 kg) IBW/kg (Calculated) : 66.1  Adjusted Body Weight: N/A  Vital Signs: Temp: 98.8 F (37.1 C) (11/08 0317) Temp src: Oral (11/08 0317) BP: 91/62 mmHg (11/08 0317) Pulse Rate: 93  (11/08 0317)  Labs:  Basename 07/03/11 0543 07/02/11 0500 07/01/11 0902  HGB 10.8* 10.8* --  HCT 32.6* 32.5* 33.2*  PLT 345 324 295  APTT -- -- --  LABPROT 16.2* -- --  INR 1.27 -- --  HEPARINUNFRC -- -- --  CREATININE -- -- 1.57*  CKTOTAL -- -- --  CKMB -- -- --  TROPONINI -- -- --   Estimated Creatinine Clearance: 45 ml/min (by C-G formula based on Cr of 1.57).   Medications:  Scheduled:     . aspirin EC  81 mg Oral Daily  . enoxaparin (LOVENOX) injection  95 mg Subcutaneous Q12H  . enoxaparin   Does not apply Once  . ketorolac  30 mg Intravenous Once  . levalbuterol  1.25 mg Nebulization Q6H  . metoprolol tartrate  12.5 mg Oral BID  . montelukast  10 mg Oral QHS  . multivitamins ther. w/minerals  1 tablet Oral Daily  . pantoprazole  40 mg Oral Q1200  . polyethylene glycol  17 g Oral Daily  . polyvinyl alcohol  1 drop Both Eyes BID  . rosuvastatin  40 mg Oral q1800  . Ticagrelor  90 mg Oral BID  . valsartan  40 mg Oral BID  . warfarin  5 mg Oral NOW    Assessment: 76 yo male previously on heparin and Coumadin this admission, anticoagulation held due to groin hematoma. Lovenox and Coumadin restarted 11/7.  INR is below-goal.   Goal of Therapy:  INR 2-3   Plan:  1. Coumadin 5 mg po today. 2. Continue Lovenox 95 mg sq q12hrs.   Otila Back 07/03/2011,9:53 AM

## 2011-07-03 NOTE — Progress Notes (Signed)
Subjective: Pt. Denies chest pain or SOB.  Rt. Thigh pain increased in the AM, but improves with movement. Denies constipation.    Objective: Vital signs in last 24 hours: Temp:  [98.1 F (36.7 C)-99.4 F (37.4 C)] 98.8 F (37.1 C) (11/08 0317) Pulse Rate:  [86-95] 93  (11/08 0317) Resp:  [18] 18  (11/08 0317) BP: (83-111)/(53-72) 91/62 mmHg (11/08 0317) SpO2:  [91 %-95 %] 92 % (11/08 0833) FiO2 (%):  [21 %] 21 % (11/07 2016) Weight change:  Last BM Date: 07/03/11 Intake/Output from previous day: 11/07 0701 - 11/08 0700 In: 1335 [P.O.:1335] Out: 703 [Urine:700; Stool:3] Intake/Output this shift:    PE: GENERAL:  Alert, oriented, no complaints, pleasant affect. HEART:  S1S2, RRR, NO Murmur, gallop, rub or click. LUNGS:  Diminished through bases with occasional crackle and pops. ABD: Soft, non-tender. Positive bowel sounds.  EXT: Rt. Thigh twice as large as left. With ecchymosis on post thigh, yellowish green on ant thigh, with improving hematoma. Lab Results:  Basename 07/03/11 0543 07/02/11 0500  WBC 12.8* 14.7*  HGB 10.8* 10.8*  HCT 32.6* 32.5*  PLT 345 324   BMET  Basename 07/01/11 0902  NA 138  K 4.6  CL 103  CO2 25  GLUCOSE 132*  BUN 22  CREATININE 1.57*  CALCIUM 9.1   No results found for this basename: TROPONINI:2,CK,MB:2 in the last 72 hours  Lab Results  Component Value Date   CHOL 128 06/22/2011   HDL 37* 06/22/2011   LDLCALC 72 06/22/2011   TRIG 94 06/22/2011   CHOLHDL 3.5 06/22/2011   Lab Results  Component Value Date   HGBA1C 6.0* 06/21/2011     Lab Results  Component Value Date   TSH 3.184 06/21/2011    Hepatic Function Panel No results found for this basename: PROT,ALBUMIN,AST,ALT,ALKPHOS,BILITOT,BILIDIR,IBILI in the last 72 hours No results found for this basename: CHOL in the last 72 hours No results found for this basename: PROTIME in the last 72 hours    EKG: Orders placed during the hospital encounter of 06/21/11  . EKG    . EKG  . EKG  . EKG    Studies/Results: No results found.  Medications: I have reviewed the patient's current medications.  Assessment/Plan: Patient Active Problem List  Diagnoses  . DYSLIPIDEMIA  . HYPERTENSION  . CAD  . CARDIOMYOPATHY, ISCHEMIC  . Shortness of breath  . Groin hematoma  . Acute blood loss anemia  . ST elevation (STEMI) myocardial infarction involving left anterior descending coronary artery  . Post-infarction apical thrombus  . Acute on chronic systolic congestive heart failure  . CKD (chronic kidney disease) stage 3, GFR 30-59 ml/min  . COPD (chronic obstructive pulmonary disease)  . Prostate cancer  . Acute thrombus of left ventricle   PLAN:  Pt. Was started back on Lovenox and coumadin yesterday, for thrombus in LV.  H/H remain stable. ? D/c today vs. Tomorrow with another day on monitored on Lovenox? Also may need assistance with medications, will have case manager assist [place] with cost and if his pharmacy has the [place].  He is ambulating with cardiac rehab without problems.     MD to see to decide about D/C. Also fever of 99.4 last pm.     LOS: 12 days   INGOLD,LAURA R 07/03/2011, 10:42 AM  Agree with note written by Cecilie Kicks RNP  Pt w/o CP/SOB. Ambulating. R groin tense and slightly tender. 2+ R pp. Started on Lovenox/Coumadin A/C yesterday for apical  mural thrombus. Will check INR tomorrow and decide re D/C home.   Lorretta Harp 07/03/2011 3:26 PM

## 2011-07-04 LAB — CBC
HCT: 32.8 % — ABNORMAL LOW (ref 39.0–52.0)
Hemoglobin: 10.8 g/dL — ABNORMAL LOW (ref 13.0–17.0)
MCV: 93.2 fL (ref 78.0–100.0)
RBC: 3.52 MIL/uL — ABNORMAL LOW (ref 4.22–5.81)
RDW: 14.3 % (ref 11.5–15.5)
WBC: 12.9 10*3/uL — ABNORMAL HIGH (ref 4.0–10.5)

## 2011-07-04 LAB — PROTIME-INR: INR: 1.3 (ref 0.00–1.49)

## 2011-07-04 MED ORDER — VALSARTAN 40 MG PO TABS: 40.0000 mg | ORAL_TABLET | Freq: Two times a day (BID) | ORAL | Status: DC

## 2011-07-04 MED ORDER — TICAGRELOR 90 MG PO TABS: 90.0000 mg | ORAL_TABLET | Freq: Two times a day (BID) | ORAL | Status: DC

## 2011-07-04 MED ORDER — METOPROLOL TARTRATE 12.5 MG HALF TABLET: 12.5000 mg | ORAL_TABLET | Freq: Two times a day (BID) | ORAL | Status: DC

## 2011-07-04 MED ORDER — ENOXAPARIN SODIUM 30 MG/0.3ML ~~LOC~~ SOLN: 1.0000 mg/kg | Freq: Two times a day (BID) | SUBCUTANEOUS | Status: DC

## 2011-07-04 MED ORDER — WARFARIN SODIUM 7.5 MG PO TABS
7.5000 mg | ORAL_TABLET | Freq: Once | ORAL | Status: DC
  Filled 2011-07-04: qty 1

## 2011-07-04 MED ORDER — LEVALBUTEROL TARTRATE 45 MCG/ACT IN AERO: 2.0000 | INHALATION_SPRAY | Freq: Four times a day (QID) | RESPIRATORY_TRACT | Status: DC | PRN

## 2011-07-04 MED ORDER — ENOXAPARIN SODIUM 100 MG/ML ~~LOC~~ SOLN
1.0000 mg/kg | Freq: Two times a day (BID) | SUBCUTANEOUS | Status: DC
  Filled 2011-07-04: qty 1

## 2011-07-04 MED ORDER — WARFARIN SODIUM 7.5 MG PO TABS: 7.5000 mg | ORAL_TABLET | Freq: Once | ORAL | Status: DC

## 2011-07-04 NOTE — Progress Notes (Signed)
[facility]  Subjective: Feels "wonderful"  Objective: Vital signs in last 24 hours: Temp:  [97.5 F (36.4 C)-98.5 F (36.9 C)] 97.5 F (36.4 C) (11/09 0620) Pulse Rate:  [95-101] 101  (11/09 0620) Resp:  [18] 18  (11/09 0620) BP: (101-118)/(60-70) 101/66 mmHg (11/09 0620) SpO2:  [92 %-96 %] 96 % (11/09 0839) Weight:  [91.8 kg (202 lb 6.1 oz)] 202 lb 6.1 oz (91.8 kg) (11/09 0620) Last BM Date: 07/04/11  Intake/Output from previous day: 11/08 0701 - 11/09 0700 In: 720 [P.O.:720] Out: 1877 [Urine:1875; Stool:2] Intake/Output this shift: Total I/O In: 240 [P.O.:240] Out: 1 [Stool:1]  Medications Current Facility-Administered Medications  Medication Dose Route Frequency Provider Last Rate Last Dose  . acetaminophen (TYLENOL) tablet 650 mg  650 mg Oral Q4H PRN Eudelia Bunch, PHARMD      . ALPRAZolam Duanne Moron) tablet 0.25 mg  0.25 mg Oral BID PRN Eudelia Bunch, PHARMD   0.25 mg at 07/03/11 2225  . alum & mag hydroxide-simeth (MAALOX/MYLANTA) 200-200-20 MG/5ML suspension 15-30 mL  15-30 mL Oral Q2H PRN Eudelia Bunch, PHARMD      . aspirin EC tablet 81 mg  81 mg Oral Daily Eudelia Bunch, PHARMD   81 mg at 07/03/11 1109  . carisoprodol (SOMA) tablet 350 mg  350 mg Oral TID PRN Eudelia Bunch, PHARMD   350 mg at 07/03/11 2306  . diphenhydrAMINE (BENADRYL) capsule 25 mg  25 mg Oral Q4H PRN Eudelia Bunch, PHARMD      . enoxaparin (LOVENOX) injection 95 mg  95 mg Subcutaneous Q12H Gay Filler Carney, PHARMD   95 mg at 07/04/11 0818  . guaiFENesin-dextromethorphan (ROBITUSSIN DM) 100-10 MG/5ML syrup 15 mL  15 mL Oral Q4H PRN Eudelia Bunch, PHARMD      . HYDROcodone-acetaminophen (NORCO) 5-325 MG per tablet 2 tablet  2 tablet Oral TID PRN Eudelia Bunch, PHARMD      . ketorolac (TORADOL) 30 MG/ML injection 30 mg  30 mg Intravenous Once Amy L. Grandville Silos, PA      . levalbuterol [facility] HFA) inhaler 2 puff  2 puff Inhalation Q6H PRN Isaiah Serge, NP       . levalbuterol [facility]) nebulizer solution 0.63 mg  0.63 mg Nebulization Q6H PRN Eudelia Bunch, PHARMD      . levalbuterol Penne Lash) nebulizer solution 1.25 mg  1.25 mg Nebulization Q6H Thomas A Kelly   1.25 mg at 07/04/11 [id number]  . loperamide (IMODIUM) capsule 2 mg  2 mg Oral PRN Eudelia Bunch, PHARMD      . metoprolol tartrate (LOPRESSOR) tablet 12.5 mg  12.5 mg Oral BID Nadean Corwin. Hilty   12.5 mg at 07/03/11 2226  . montelukast (SINGULAIR) tablet 10 mg  10 mg Oral QHS Eudelia Bunch, PHARMD   10 mg at 07/03/11 2226  . morphine 4 MG/ML injection 3 mg  3 mg Intravenous Q1H PRN Eudelia Bunch, PHARMD      . multivitamins ther. w/minerals tablet 1 tablet  1 tablet Oral Daily Eudelia Bunch, PHARMD   1 tablet at 07/03/11 1000  . nitroGLYCERIN (NITROSTAT) SL tablet 0.4 mg  0.4 mg Sublingual Q5 Min x 3 PRN Eudelia Bunch, PHARMD      . ondansetron (ZOFRAN) injection 4 mg  4 mg Intravenous Q6H PRN Eudelia Bunch, PHARMD      . oxyCODONE-acetaminophen (PERCOCET) 5-325 MG per tablet 1 tablet  1 tablet Oral Q6H PRN  Eudelia Bunch, PHARMD      . pantoprazole (PROTONIX) EC tablet 40 mg  40 mg Oral Q1200 Eudelia Bunch, PHARMD   40 mg at 07/03/11 1225  . polyethylene glycol (MIRALAX / GLYCOLAX) packet 17 g  17 g Oral Daily Eudelia Bunch, PHARMD   17 g at 06/29/11 1015  . polyvinyl alcohol (LIQUIFILM TEARS) 1.4 % ophthalmic solution 1 drop  1 drop Both Eyes BID Troy Sine   1 drop at 07/03/11 2226  . pramoxine-mineral oil-zinc (TUCK'S) rectal ointment 1 application  1 application Rectal TID PRN Eudelia Bunch, PHARMD      . rosuvastatin (CRESTOR) tablet 40 mg  40 mg Oral q1800 Eudelia Bunch, PHARMD   40 mg at 07/03/11 1828  . Ticagrelor (BRILINTA) tablet 90 mg  90 mg Oral BID Eudelia Bunch, PHARMD   90 mg at 07/03/11 2227  . traMADol (ULTRAM) tablet 50 mg  50 mg Oral Q6H PRN Eudelia Bunch, PHARMD      . valsartan (DIOVAN) tablet 40 mg  40 mg Oral BID Eudelia Bunch, PHARMD   40 mg at  07/03/11 2227  . warfarin (COUMADIN) tablet 5 mg  5 mg Oral ONCE-1800 Otila Back, PHARMD   5 mg at 07/03/11 1828  . zolpidem (AMBIEN) tablet 5 mg  5 mg Oral QHS PRN Eudelia Bunch, PHARMD        PE: General appearance: alert, cooperative and no distress Lungs: clear to auscultation bilaterally Heart: regular rhythm, rate tachy. no mm Extremities: no edema Pulses: 2+ radials  Rate 100BPM Skin:  Right leg:  Decreasing ecchymosis, + medial thigh hematoma.  Lab Results:   Basename 07/04/11 0500 07/03/11 0543 07/02/11 0500  WBC 12.9* 12.8* 14.7*  HGB 10.8* 10.8* 10.8*  HCT 32.8* 32.6* 32.5*  PLT 377 345 324   BMET  Basename 07/03/11 1153  NA 138  K 4.1  CL 101  CO2 26  GLUCOSE 130*  BUN 27*  CREATININE 1.54*  CALCIUM 8.6   PT/INR  Basename 07/04/11 0500 07/03/11 0543  LABPROT 16.4* 16.2*  INR 1.30 1.27    Studies/Results:   Assessment/Plan  Principal Problem:  *ST elevation (STEMI) myocardial infarction involving left anterior descending coronary artery Active Problems:  Groin hematoma  Acute thrombus of left ventricle  DYSLIPIDEMIA  HYPERTENSION  CAD  CARDIOMYOPATHY, ISCHEMIC  Shortness of breath  Acute blood loss anemia  Post-infarction apical thrombus  Acute on chronic systolic congestive heart failure  CKD (chronic kidney disease) stage 3, GFR 30-59 ml/min  COPD (chronic obstructive pulmonary disease)  Prostate cancer    LOS: 13 days   Subtherapeutic INR.  Home with Lovenox.  Tachy with low normal BP.  Lopressor at 12.5 BID.  Not much room to increase.  Can be DC'd today.  Improving leukocytosis.  Hgb stable.  HAGER,BRYAN W 07/04/2011 9:36 AM   Reexamined and reviewed. Agree with Gaspar Bidding Hager's note. DC home with INR F/U Tuesday.

## 2011-07-04 NOTE — Progress Notes (Signed)
CARDIAC REHAB PHASE I   PRE:  Rate/Rhythm: 97SR  BP:  Supine:   Sitting: 106/66  Standing:    SaO2: 92%RA  MODE:  Ambulation: 690 ft   POST:  Rate/Rhythem: 107ST  BP:  Supine:   Sitting: 106/70  Standing:    SaO2: 97%2L Tolerated walk well on 2L O2 and walker and minimal asst.  States groin and leg feeling better. To chair with call bell and back to RA. [id number]  [name]

## 2011-07-04 NOTE — Discharge Summary (Signed)
Physician Discharge Summary  Patient ID: [name] MRN: [id number] DOB/AGE: 1935/04/08 76 y.o.  Admit date: 06/21/2011 Discharge date: 07/04/2011  Admission Diagnoses:  Discharge Diagnoses:  Principal Problem:  *ST elevation (STEMI) myocardial infarction involving left anterior descending coronary artery Active Problems:  Groin hematoma  Acute thrombus of left ventricle  DYSLIPIDEMIA  HYPERTENSION  CAD  CARDIOMYOPATHY, ISCHEMIC  Shortness of breath  Acute blood loss anemia  Post-infarction apical thrombus  Acute on chronic systolic congestive heart failure  CKD (chronic kidney disease) stage 3, GFR 30-59 ml/min  COPD (chronic obstructive pulmonary disease)  Prostate cancer   Discharged Condition: Good/Stable   Hospital Course:  Mr. Franne Forts is a 76 year old Caucasian male with a history of coronary artery disease anterior myocardial infarction in 2006 she was "kissing balloon" by Dr. Lia Foyer. His best medical history includes COPD uses an oxygen at night and when necessary during the day. Since she also includes dyslipidemia hypertension prostate cancer. Prior to admission he developed midsternal chest pain and dizziness. EMS was called. EKG revealed ST elevation myocardial infarction was transferred directly to the Cath Lab By Dr. [name]. Catheterization revealed LAD was totally occluded proximally prior to the previously placed stented segment. Circumflex vessel had 30% narrowing just proximal to its bifurcation to the marginal vessel. The right coronary was a dominant vessel with 30% narrowing proximally and 40% smooth PDA stenosis. Percutaneous coronary intervention to the LAD with PTCA, Expressway thrombectomy and stenting with 100% occlusion been reduced to 0% and the ultimate insertion of a 2.75 x 26 mm drug-eluting stent.2-D echocardiogram revealing an ejection fraction of 35-40%. Diffuse hypokinesis. There was a thrombus in the left ventricle. Moderate to severe hypokinesis  of the mid to distal anterior anteroseptal and anterolateral myocardium. She developed a 6.5 x 5.5 cm intramuscular hematoma in the right proximal thigh anterior medially. no evidence of retroperitoneal bleed.the patient has had progressive improvement in right thigh hematoma. His recently been restarted on Coumadin with Lovenox bridge for LV thrombus.patient was in stable condition will be discharged home today.   Significant Diagnostic Studies:  Cardiac Cath: IMPRESSION:   1. Acute ST-segment elevation anterior wall myocardial infarction       secondary to total occlusion of the very proximal LAD proximal to       the remotely placed Horizon study drug stent inserted in 2006.   2. A 30% narrowing in the AV groove circumflex.   3. A 30% proximal and RCA narrowing with 40% smooth narrowing in the       proximal portion of the PDA.   4. Percutaneous coronary intervention to the LAD with PTCA, expressway       thrombectomy, and ultimate stenting with the 100% occlusion being       reduced to 0%, and ultimate insertion of a new 2.75 x 26-mm DES       resolute stent post dilated to 3.0-mm tapering to 2.91-mm done with       bivalirudin/Integrilin/and oral Brilinta.   5. Chest pain onset commenced at 8:30.  The patient arrived to [facility] at 10:06.  The patient arrived to the cardiac       catheterization laboratory at 10:17.  The first balloon inflation       was at 10:42 giving a balloon time from cath lab arrival at 25       minutes and from arrival to [facility] at 36 minutes.   CT- abd/Pelvis  IMPRESSION:    1.  Intramuscular hematoma in the proximal right thigh   anteromedially.   2.  3.1 cm oval fluid collection anterior to the common femoral   vessels on the right.  This could represent a more liquified   hematoma or small developing abscess.   3.  Subcutaneous edema at the level of the right groin.   4.  No retroperitoneal hemorrhage.   5.  Left  nephrolithiasis.   6.  Colonic diverticulosis.   7.  Small pericardial effusion.  2d-Echo - Definity used to rule out apical thrombus Limited study     with multple views using Definity contrast only.   - Left ventricle: The cavity size was normal. Wall thickness     was at the upper limits of normal. Systolic function was     moderately reduced. The estimated ejection fraction was in     the range of 35% to 40%. Diffuse hypokinesis. Moderate to     severe Hypokinesis of the mid to distal anterior,     anteroseptal and anterolateral myocardium. There was a     thrombus. There was an apparent, medium-sized, irregular,     mobile, apical anteriorthrombus.   Transthoracic echocardiography.  M-mode, limited 2D, limited   spectral Doppler, and color Doppler.  Height:  Height:   170.2cm. Height: 67in.  Weight:  Weight: 95.8kg. Weight:   210.8lb.  Body mass index:  BMI: 33.1kg/m^2.  Body surface   area:    BSA: 2.62m^2.  Patient status:  Inpatient.   Location:  Bedside.    Discharge Exam: Blood pressure 103/73, pulse 99, temperature 99.4 F (37.4 C), temperature source Oral, resp. rate 17, height 5\' 7"  (1.702 m), weight 91.8 kg (202 lb 6.1 oz), SpO2 90.00%. See rounding exam 07/04/11  Disposition:   Discharge Orders    Future Orders Please Complete By Expires   Diet - low sodium heart healthy      Increase activity slowly        Current Discharge Medication List    START taking these medications   Details  enoxaparin (LOVENOX) 30 MG/0.3ML SOLN Inject 0.92 mLs (92 mg total) into the skin every 12 (twelve) hours. Qty: 18.9 mL, Refills: 0    levalbuterol (XOPENEX HFA) 45 MCG/ACT inhaler Inhale 2 puffs into the lungs every 6 (six) hours as needed for wheezing. Qty: 1 Inhaler, Refills: 3    Ticagrelor (BRILINTA) 90 MG TABS tablet Take 1 tablet (90 mg total) by mouth 2 (two) times daily. Qty: 60 tablet, Refills: 10    valsartan (DIOVAN) 40 MG tablet Take 1 tablet (40 mg total) by  mouth 2 (two) times daily. Qty: 60 tablet, Refills: 3    warfarin (COUMADIN) 7.5 MG tablet Take 1 tablet (7.5 mg total) by mouth one time only at 6 PM. Qty: 30 tablet, Refills: 5      CONTINUE these medications which have CHANGED   Details  metoprolol tartrate (LOPRESSOR) 12.5 mg TABS Take 0.5 tablets (12.5 mg total) by mouth 2 (two) times daily. Qty: 30 tablet, Refills: 3      CONTINUE these medications which have NOT CHANGED   Details  albuterol (PROVENTIL) (2.5 MG/3ML) 0.083% nebulizer solution Take 2.5 mg by nebulization 3 (three) times daily as needed. For coughing     albuterol-ipratropium (COMBIVENT) 18-103 MCG/ACT inhaler Inhale 2 puffs into the lungs 3 (three) times daily as needed. For coughing     aspirin EC 325 MG tablet Take 325 mg by  mouth daily.      carisoprodol (SOMA) 350 MG tablet Take 350 mg by mouth 3 (three) times daily as needed. For back pain     clopidogrel (PLAVIX) 75 MG tablet Take 75 mg by mouth daily.      HYDROcodone-acetaminophen (LORCET) 10-650 MG per tablet Take 1 tablet by mouth 3 (three) times daily as needed. For pain     montelukast (SINGULAIR) 10 MG tablet Take 10 mg by mouth daily.      Multiple Vitamins-Minerals (MULTIVITAMINS THER. W/MINERALS) TABS Take 0.5 tablets by mouth 2 (two) times daily.      nitroGLYCERIN (NITROSTAT) 0.4 MG SL tablet Place 0.4 mg under the tongue every 5 (five) minutes as needed. For chest pain     omeprazole (PRILOSEC) 20 MG capsule Take 20 mg by mouth daily.      OVER THE COUNTER MEDICATION Place 1 drop into both eyes 2 (two) times daily. Saline Eye drops     simvastatin (ZOCOR) 80 MG tablet Take 80 mg by mouth daily.        STOP taking these medications     lisinopril (PRINIVIL,ZESTRIL) 5 MG tablet          Signed: HAGER,BRYAN W 07/04/2011, 2:24 PM  Reexamined and chart reviewed. Agree with [name], PA-C note. Hematoma large but improving. DC home today. INR next Tuesday.

## 2011-07-04 NOTE — Progress Notes (Signed)
ANTICOAGULATION CONSULT NOTE - Follow Up Consult  Pharmacy Consult for Lovenox->Coumadin  Indication: Post infarction LV thrombus  No Known Allergies  Patient Measurements: Height: 5\' 7"  (170.2 cm) Weight: 202 lb 6.1 oz (91.8 kg) IBW/kg (Calculated) : 66.1  Adjusted Body Weight:   Vital Signs: Temp: 97.5 F (36.4 C) (11/09 0620) Temp src: Oral (11/09 0620) BP: 101/66 mmHg (11/09 0620) Pulse Rate: 101  (11/09 0620)  Labs:  Basename 07/04/11 0500 07/03/11 1153 07/03/11 0543 07/02/11 0500  HGB 10.8* -- 10.8* --  HCT 32.8* -- 32.6* 32.5*  PLT 377 -- 345 324  APTT -- -- -- --  LABPROT 16.4* -- 16.2* --  INR 1.30 -- 1.27 --  HEPARINUNFRC -- -- -- --  CREATININE -- 1.54* -- --  CKTOTAL -- -- -- --  CKMB -- -- -- --  TROPONINI -- -- -- --   Estimated Creatinine Clearance: 45.5 ml/min (by C-G formula based on Cr of 1.54).   Medications:  Scheduled:    . aspirin EC  81 mg Oral Daily  . enoxaparin (LOVENOX) injection  95 mg Subcutaneous Q12H  . ketorolac  30 mg Intravenous Once  . levalbuterol  1.25 mg Nebulization Q6H  . metoprolol tartrate  12.5 mg Oral BID  . montelukast  10 mg Oral QHS  . multivitamins ther. w/minerals  1 tablet Oral Daily  . pantoprazole  40 mg Oral Q1200  . polyethylene glycol  17 g Oral Daily  . polyvinyl alcohol  1 drop Both Eyes BID  . rosuvastatin  40 mg Oral q1800  . Ticagrelor  90 mg Oral BID  . valsartan  40 mg Oral BID  . warfarin  5 mg Oral ONCE-1800    Assessment: 49 yom s/p MI found to have LV thrombus, patient suffered from groin hematoma post cath and anticoagulation was postponed but now restarted. INR subtherapeutic. No bleeding complications noted. Renal function has remained stable. Goal of Therapy:  INR 2-3   Plan:  Lovenox 90mg  q12 hours-will dec. Dose slightly given current weight. Warfarin 7.5 mg today  Georgina Peer 07/04/2011,11:14 AM

## 2011-07-17 ENCOUNTER — Emergency Department (HOSPITAL_COMMUNITY): Payer: Medicare Other

## 2011-07-17 ENCOUNTER — Encounter (HOSPITAL_COMMUNITY): Payer: Self-pay | Admitting: Physical Medicine and Rehabilitation

## 2011-07-17 ENCOUNTER — Inpatient Hospital Stay (HOSPITAL_COMMUNITY)
Admission: EM | Admit: 2011-07-17 | Discharge: 2011-07-23 | DRG: 908 | Disposition: A | Discharge status: Home / Self Care | Payer: Medicare Other | Source: Ambulatory Visit | Attending: Cardiology | Admitting: Cardiology

## 2011-07-17 ENCOUNTER — Other Ambulatory Visit: Payer: Self-pay

## 2011-07-17 ENCOUNTER — Inpatient Hospital Stay (HOSPITAL_COMMUNITY): Payer: Medicare Other

## 2011-07-17 DIAGNOSIS — I2109 ST elevation (STEMI) myocardial infarction involving other coronary artery of anterior wall: Secondary | ICD-10-CM | POA: Diagnosis present

## 2011-07-17 DIAGNOSIS — I236 Thrombosis of atrium, auricular appendage, and ventricle as current complications following acute myocardial infarction: Secondary | ICD-10-CM

## 2011-07-17 DIAGNOSIS — IMO0002 Reserved for concepts with insufficient information to code with codable children: Principal | ICD-10-CM | POA: Diagnosis present

## 2011-07-17 DIAGNOSIS — E785 Hyperlipidemia, unspecified: Secondary | ICD-10-CM | POA: Diagnosis present

## 2011-07-17 DIAGNOSIS — N189 Chronic kidney disease, unspecified: Secondary | ICD-10-CM | POA: Diagnosis present

## 2011-07-17 DIAGNOSIS — I2102 ST elevation (STEMI) myocardial infarction involving left anterior descending coronary artery: Secondary | ICD-10-CM

## 2011-07-17 DIAGNOSIS — Z9861 Coronary angioplasty status: Secondary | ICD-10-CM

## 2011-07-17 DIAGNOSIS — I240 Acute coronary thrombosis not resulting in myocardial infarction: Secondary | ICD-10-CM | POA: Diagnosis present

## 2011-07-17 DIAGNOSIS — I5023 Acute on chronic systolic (congestive) heart failure: Secondary | ICD-10-CM

## 2011-07-17 DIAGNOSIS — J449 Chronic obstructive pulmonary disease, unspecified: Secondary | ICD-10-CM | POA: Diagnosis present

## 2011-07-17 DIAGNOSIS — D62 Acute posthemorrhagic anemia: Secondary | ICD-10-CM | POA: Diagnosis present

## 2011-07-17 DIAGNOSIS — I129 Hypertensive chronic kidney disease with stage 1 through stage 4 chronic kidney disease, or unspecified chronic kidney disease: Secondary | ICD-10-CM | POA: Diagnosis present

## 2011-07-17 DIAGNOSIS — M79609 Pain in unspecified limb: Secondary | ICD-10-CM

## 2011-07-17 DIAGNOSIS — I97638 Postprocedural hematoma of a circulatory system organ or structure following other circulatory system procedure: Secondary | ICD-10-CM | POA: Diagnosis present

## 2011-07-17 DIAGNOSIS — J4489 Other specified chronic obstructive pulmonary disease: Secondary | ICD-10-CM | POA: Diagnosis present

## 2011-07-17 DIAGNOSIS — R0602 Shortness of breath: Secondary | ICD-10-CM

## 2011-07-17 DIAGNOSIS — C61 Malignant neoplasm of prostate: Secondary | ICD-10-CM

## 2011-07-17 DIAGNOSIS — I2589 Other forms of chronic ischemic heart disease: Secondary | ICD-10-CM | POA: Diagnosis present

## 2011-07-17 DIAGNOSIS — Z7901 Long term (current) use of anticoagulants: Secondary | ICD-10-CM

## 2011-07-17 DIAGNOSIS — Y849 Medical procedure, unspecified as the cause of abnormal reaction of the patient, or of later complication, without mention of misadventure at the time of the procedure: Secondary | ICD-10-CM | POA: Diagnosis present

## 2011-07-17 DIAGNOSIS — I2510 Atherosclerotic heart disease of native coronary artery without angina pectoris: Secondary | ICD-10-CM | POA: Diagnosis present

## 2011-07-17 DIAGNOSIS — I10 Essential (primary) hypertension: Secondary | ICD-10-CM | POA: Diagnosis present

## 2011-07-17 HISTORY — DX: Chronic obstructive pulmonary disease, unspecified: J44.9

## 2011-07-17 HISTORY — DX: Long term (current) use of anticoagulants: Z79.01

## 2011-07-17 HISTORY — DX: Thrombosis of atrium, auricular appendage, and ventricle as current complications following acute myocardial infarction: I23.6

## 2011-07-17 HISTORY — DX: Acute myocardial infarction, unspecified: I21.9

## 2011-07-17 HISTORY — DX: Atherosclerotic heart disease of native coronary artery without angina pectoris: I25.10

## 2011-07-17 HISTORY — DX: Heart failure, unspecified: I50.9

## 2011-07-17 LAB — CBC
HCT: 31.2 % — ABNORMAL LOW (ref 39.0–52.0)
Hemoglobin: 9.6 g/dL — ABNORMAL LOW (ref 13.0–17.0)
MCH: 31.8 pg (ref 26.0–34.0)
MCHC: 32.7 g/dL (ref 30.0–36.0)
MCV: 96.9 fL (ref 78.0–100.0)
Platelets: 285 10*3/uL (ref 150–400)
Platelets: 280 10*3/uL (ref 150–400)
RBC: 3.22 MIL/uL — ABNORMAL LOW (ref 4.22–5.81)
RDW: 16.1 % — ABNORMAL HIGH (ref 11.5–15.5)
RDW: 16.3 % — ABNORMAL HIGH (ref 11.5–15.5)
WBC: 10.8 10*3/uL — ABNORMAL HIGH (ref 4.0–10.5)

## 2011-07-17 LAB — DIFFERENTIAL
Basophils Absolute: 0 10*3/uL (ref 0.0–0.1)
Eosinophils Relative: 2 % (ref 0–5)
Lymphocytes Relative: 8 % — ABNORMAL LOW (ref 12–46)
Lymphs Abs: 0.9 10*3/uL (ref 0.7–4.0)
Neutro Abs: 8.6 10*3/uL — ABNORMAL HIGH (ref 1.7–7.7)

## 2011-07-17 LAB — PROTIME-INR
INR: 1.99 — ABNORMAL HIGH (ref 0.00–1.49)
Prothrombin Time: 22.9 seconds — ABNORMAL HIGH (ref 11.6–15.2)

## 2011-07-17 LAB — BASIC METABOLIC PANEL
CO2: 24 mEq/L (ref 19–32)
Chloride: 106 mEq/L (ref 96–112)
Glucose, Bld: 131 mg/dL — ABNORMAL HIGH (ref 70–99)
Sodium: 140 mEq/L (ref 135–145)

## 2011-07-17 LAB — CARDIAC PANEL(CRET KIN+CKTOT+MB+TROPI)
CK, MB: 3.2 ng/mL (ref 0.3–4.0)
Total CK: 135 U/L (ref 7–232)
Troponin I: <0.3 ng/mL (ref <0.30)

## 2011-07-17 LAB — MAGNESIUM: Magnesium: 2 mg/dL (ref 1.5–2.5)

## 2011-07-17 LAB — HEPATIC FUNCTION PANEL
ALT: 22 U/L (ref 0–53)
Bilirubin, Direct: <0.1 mg/dL (ref 0.0–0.3)
Total Protein: 6.4 g/dL (ref 6.0–8.3)

## 2011-07-17 LAB — MRSA PCR SCREENING: MRSA by PCR: NEGATIVE

## 2011-07-17 MED ORDER — ONDANSETRON HCL 4 MG PO TABS: 4.0000 mg | ORAL_TABLET | Freq: Four times a day (QID) | ORAL | Status: DC | PRN

## 2011-07-17 MED ORDER — DOCUSATE SODIUM 100 MG PO CAPS
100.0000 mg | ORAL_CAPSULE | Freq: Two times a day (BID) | ORAL | Status: DC
  Administered 2011-07-17 – 2011-07-23 (×10): 100 mg via ORAL
  Filled 2011-07-17 (×13): qty 1

## 2011-07-17 MED ORDER — VITAMIN K1 10 MG/ML IJ SOLN
2.0000 mg | Freq: Once | INTRAVENOUS | Status: AC
  Administered 2011-07-18: 2 mg via INTRAVENOUS
  Filled 2011-07-17: qty 0.2

## 2011-07-17 MED ORDER — ALUM & MAG HYDROXIDE-SIMETH 200-200-20 MG/5ML PO SUSP: 30.0000 mL | Freq: Four times a day (QID) | ORAL | Status: DC | PRN

## 2011-07-17 MED ORDER — ZOLPIDEM TARTRATE 5 MG PO TABS: 5.0000 mg | ORAL_TABLET | Freq: Every evening | ORAL | Status: DC | PRN

## 2011-07-17 MED ORDER — IPRATROPIUM-ALBUTEROL 18-103 MCG/ACT IN AERO
2.0000 | INHALATION_SPRAY | Freq: Three times a day (TID) | RESPIRATORY_TRACT | Status: DC | PRN
  Filled 2011-07-17: qty 14.7

## 2011-07-17 MED ORDER — SODIUM CHLORIDE 0.9 % IV SOLN
INTRAVENOUS | Status: DC
  Administered 2011-07-17 – 2011-07-18 (×3): via INTRAVENOUS

## 2011-07-17 MED ORDER — CEFAZOLIN SODIUM 1-5 GM-% IV SOLN
1.0000 g | Freq: Once | INTRAVENOUS | Status: AC
  Administered 2011-07-18: 1 g via INTRAVENOUS
  Filled 2011-07-17: qty 50

## 2011-07-17 MED ORDER — NITROGLYCERIN 0.4 MG SL SUBL: 0.4000 mg | SUBLINGUAL_TABLET | SUBLINGUAL | Status: DC | PRN

## 2011-07-17 MED ORDER — LEVALBUTEROL HCL 1.25 MG/0.5ML IN NEBU
1.2500 mg | INHALATION_SOLUTION | Freq: Four times a day (QID) | RESPIRATORY_TRACT | Status: DC | PRN
  Administered 2011-07-18 (×2): 1.25 mg via RESPIRATORY_TRACT
  Filled 2011-07-17 (×2): qty 0.5

## 2011-07-17 MED ORDER — MORPHINE SULFATE 2 MG/ML IJ SOLN
2.0000 mg | INTRAMUSCULAR | Status: DC | PRN
  Administered 2011-07-20 (×2): 2 mg via INTRAVENOUS
  Filled 2011-07-17 (×5): qty 1

## 2011-07-17 MED ORDER — ROSUVASTATIN CALCIUM 20 MG PO TABS
20.0000 mg | ORAL_TABLET | Freq: Every day | ORAL | Status: DC
  Administered 2011-07-17 – 2011-07-23 (×6): 20 mg via ORAL
  Filled 2011-07-17 (×7): qty 1

## 2011-07-17 MED ORDER — PANTOPRAZOLE SODIUM 40 MG PO TBEC
40.0000 mg | DELAYED_RELEASE_TABLET | Freq: Every day | ORAL | Status: DC
  Administered 2011-07-17 – 2011-07-22 (×5): 40 mg via ORAL
  Filled 2011-07-17 (×5): qty 1

## 2011-07-17 MED ORDER — MONTELUKAST SODIUM 10 MG PO TABS
10.0000 mg | ORAL_TABLET | Freq: Every day | ORAL | Status: DC
  Administered 2011-07-17 – 2011-07-23 (×7): 10 mg via ORAL
  Filled 2011-07-17 (×8): qty 1

## 2011-07-17 MED ORDER — ALPRAZOLAM 0.5 MG PO TABS
0.5000 mg | ORAL_TABLET | Freq: Every evening | ORAL | Status: DC | PRN
  Administered 2011-07-17 – 2011-07-18 (×2): 0.5 mg via ORAL
  Filled 2011-07-17 (×2): qty 1

## 2011-07-17 MED ORDER — TICAGRELOR 90 MG PO TABS
90.0000 mg | ORAL_TABLET | Freq: Two times a day (BID) | ORAL | Status: DC
  Administered 2011-07-17 – 2011-07-23 (×11): 90 mg via ORAL
  Filled 2011-07-17 (×13): qty 1

## 2011-07-17 MED ORDER — ONDANSETRON HCL 4 MG/2ML IJ SOLN: 4.0000 mg | Freq: Four times a day (QID) | INTRAMUSCULAR | Status: DC | PRN

## 2011-07-17 MED ORDER — METOPROLOL TARTRATE 12.5 MG HALF TABLET
12.5000 mg | ORAL_TABLET | Freq: Two times a day (BID) | ORAL | Status: DC
  Administered 2011-07-19 – 2011-07-23 (×9): 12.5 mg via ORAL
  Filled 2011-07-17 (×12): qty 1

## 2011-07-17 MED ORDER — HYDROCODONE-ACETAMINOPHEN 10-325 MG PO TABS: 1.0000 | ORAL_TABLET | Freq: Four times a day (QID) | ORAL | Status: DC | PRN

## 2011-07-17 MED ORDER — LEVALBUTEROL TARTRATE 45 MCG/ACT IN AERO
2.0000 | INHALATION_SPRAY | Freq: Four times a day (QID) | RESPIRATORY_TRACT | Status: DC | PRN
  Administered 2011-07-17: 2 via RESPIRATORY_TRACT
  Filled 2011-07-17: qty 15

## 2011-07-17 MED ORDER — CARISOPRODOL 350 MG PO TABS
350.0000 mg | ORAL_TABLET | Freq: Three times a day (TID) | ORAL | Status: DC
  Administered 2011-07-17 – 2011-07-22 (×14): 350 mg via ORAL
  Filled 2011-07-17 (×13): qty 1

## 2011-07-17 MED ORDER — HYDROCODONE-ACETAMINOPHEN 10-325 MG PO TABS
1.0000 | ORAL_TABLET | Freq: Four times a day (QID) | ORAL | Status: DC | PRN
  Administered 2011-07-17 – 2011-07-22 (×4): 1 via ORAL
  Filled 2011-07-17 (×4): qty 1

## 2011-07-17 MED ORDER — METOPROLOL TARTRATE 12.5 MG HALF TABLET
12.5000 mg | ORAL_TABLET | Freq: Two times a day (BID) | ORAL | Status: DC
  Filled 2011-07-17: qty 1

## 2011-07-17 NOTE — ED Notes (Signed)
Pt transported to CT scan. Vital signs remains stable. Family at bedside. No signs of distress. Pressure dressing remains to R groin.

## 2011-07-17 NOTE — Consult Note (Signed)
Vascular and Vein Specialist of [facility]  Patient name: [name] MRN: [id number] DOB: 1935-02-11 Sex: male  REASON FOR CONSULT: pain and swelling in right groin  HPI: [name] is a 76 y.o. male who was admitted to [facility] on 06/21/2011 with a STEMI. He underwent PTCA via a right femoral approach. He subsequently developed a large right groin hematoma. This was followed and ultimately he was discharged on 07/04/2011. CT scan that admission showed a hematoma adjacent to the right common femoral artery and intramuscular hematoma in the proximal right thigh. The patient had been doing well after discharge until today when he developed the sudden onset of pain in his right groin and subsequent swelling. He was brought to the emergency department in the cardiologist and less than vascular surgery to evaluate the right groin. He states his pain currently is much improved. There are no aggravating or alleviating factors associated with the pain. He's had no history of claudication, rest pain, or nonhealing ulcers.  Of note the patient has been on Coumadin because his echo demonstrated a ventricular thrombus related to his recent MI.  Past Medical History  Diagnosis Date  . Myocardial infarction   . Asthma   . COPD (chronic obstructive pulmonary disease)   . Emphysema   . CAD 04/23/2009  . Anticoagulated on warfarin 07/17/2011  . CHF (congestive heart failure)   . Ventricular thrombus following MI (myocardial infarction) 11/12    on coumadin    Family History  Problem Relation Age of Onset  . Diabetes type II Mother   . Coronary artery disease Mother   . Coronary artery disease Father   . Diabetes type II Brother   . Coronary artery disease Brother   . Craniosynostosis Neg Hx     SOCIAL HISTORY: History  Substance Use Topics  . Smoking status: Former Smoker -- 0.1 packs/day    Quit date: 04/14/2011  . Smokeless tobacco: Never Used  . Alcohol Use: No    Allergies    Allergen Reactions  . Ambien     Pt. Became confused and aggitated  . Demerol     Current Facility-Administered Medications  Medication Dose Route Frequency Provider Last Rate Last Dose  . 0.9 %  sodium chloride infusion   Intravenous Continuous Isaiah Serge, NP 50 mL/hr at 07/17/11 1735    . ALPRAZolam Duanne Moron) tablet 0.5 mg  0.5 mg Oral QHS PRN Isaiah Serge, NP      . DISCONTD: zolpidem (AMBIEN) tablet 5 mg  5 mg Oral QHS PRN Isaiah Serge, NP       Current Outpatient Prescriptions  Medication Sig Dispense Refill  . albuterol (PROVENTIL) (2.5 MG/3ML) 0.083% nebulizer solution Take 2.5 mg by nebulization 3 (three) times daily as needed. For coughing       . albuterol-ipratropium (COMBIVENT) 18-103 MCG/ACT inhaler Inhale 2 puffs into the lungs 3 (three) times daily as needed. For coughing       . carisoprodol (SOMA) 350 MG tablet Take 350 mg by mouth 3 (three) times daily as needed. For back pain       . HYDROcodone-acetaminophen (LORCET) 10-650 MG per tablet Take 1 tablet by mouth 3 (three) times daily as needed. For pain       . levalbuterol (XOPENEX HFA) 45 MCG/ACT inhaler Inhale 2 puffs into the lungs every 6 (six) hours as needed for wheezing.  1 Inhaler  3  . montelukast (SINGULAIR) 10 MG tablet Take 10 mg by  mouth daily.        . Multiple Vitamins-Minerals (MULTIVITAMINS THER. W/MINERALS) TABS Take 0.5 tablets by mouth 2 (two) times daily.        . nitroGLYCERIN (NITROSTAT) 0.4 MG SL tablet Place 0.4 mg under the tongue every 5 (five) minutes as needed. For chest pain       . omeprazole (PRILOSEC) 20 MG capsule Take 20 mg by mouth daily.        Marland Kitchen OVER THE COUNTER MEDICATION Place 1 drop into both eyes 2 (two) times daily. Saline Eye drops       . simvastatin (ZOCOR) 80 MG tablet Take 80 mg by mouth daily.        . Ticagrelor (BRILINTA) 90 MG TABS tablet Take 1 tablet (90 mg total) by mouth 2 (two) times daily.  60 tablet  10  . valsartan (DIOVAN) 40 MG tablet Take 1 tablet (40  mg total) by mouth 2 (two) times daily.  60 tablet  3  . warfarin (COUMADIN) 7.5 MG tablet Take 1 tablet (7.5 mg total) by mouth one time only at 6 PM.  30 tablet  5    REVIEW OF SYSTEMS: Valu.Nieves ] denotes positive finding; [  ] denotes negative finding CARDIOVASCULAR:  [ ]  chest pain   [ ]  chest pressure   [ ]  palpitations   [ ]  orthopnea   Valu.Nieves ] dyspnea on exertion   [ ]  claudication   [ ]  rest pain   [ ]  DVT   [ ]  phlebitis PULMONARY:   Valu.Nieves ] productive cough   [ ]  asthma   Valu.Nieves ] wheezing NEUROLOGIC:   [ ]  weakness  [ ]  paresthesias  [ ]  aphasia  [ ]  amaurosis  [ ]  dizziness HEMATOLOGIC:   [ ]  bleeding problems   [ ]  clotting disorders MUSCULOSKELETAL:  Valu.Nieves ] joint pain knees, hands  [ ]  joint swelling [ ]  leg swelling GASTROINTESTINAL: [ ]   blood in stool  [ ]   hematemesis GENITOURINARY:  [ ]   dysuria  [ ]   hematuria PSYCHIATRIC:  [ ]  history of major depression INTEGUMENTARY:  [ ]  rashes  [ ]  ulcers CONSTITUTIONAL:  [ ]  fever   [ ]  chills  PHYSICAL EXAM: Filed Vitals:   07/17/11 1710 07/17/11 1813 07/17/11 1830 07/17/11 1849  BP: 111/82 121/91 124/74 111/76  Pulse: 90 87 91 93  Temp: 98.7 F (37.1 C)  98.6 F (37 C)   TempSrc:   Oral   Resp: 23 18 18 15   SpO2: 99% 100% 100% 100%   There is no height or weight on file to calculate BMI. GENERAL: The patient is a well-nourished male, in no acute distress. The vital signs are documented above. CARDIOVASCULAR: There is a regular rate and rhythm without significant murmur appreciated. I do not detect any carotid bruits. He has palpable femoral pulses. He has a palpable  Right dorsalis pedis pulse and posterior tibial pulses bilaterally.He has mild bilateral lower extremity swelling. PULMONARY: There is good air exchange bilaterally without wheezing or rales. ABDOMEN: Soft and non-tender with normal pitched bowel sounds. I do not palpate an abdominal aortic aneurysm although it is difficult to assess because of his size. MUSCULOSKELETAL: There  are no major deformities or cyanosis. NEUROLOGIC: No focal weakness or paresthesias are detected. Ext:he has ecchymosis in the right thigh and moderate swelling in the proximal right thigh. PSYCHIATRIC: The patient has a normal affect.  DATA:  Lab Results  Component Value Date  WBC 10.8* 07/17/2011   HGB 10.2* 07/17/2011   HCT 31.2* 07/17/2011   MCV 96.9 07/17/2011   PLT 285 07/17/2011   Lab Results  Component Value Date   NA 140 07/17/2011   K 4.0 07/17/2011   CL 106 07/17/2011   CO2 24 07/17/2011   Lab Results  Component Value Date   CREATININE 1.44* 07/17/2011   Lab Results  Component Value Date   INR 1.99* 07/17/2011   INR 1.30 07/04/2011   INR 1.27 07/03/2011   Lab Results  Component Value Date   HGBA1C 6.0* 06/21/2011   CBG (last 3)  No results found for this basename: GLUCAP:3 in the last 72 hours   I have reviewed his CT scan which was performed tonight. This shows a large hematoma in the right thigh. There is no retroperitoneal hematoma.  MEDICAL ISSUES: This patient has a large hematoma in the right groin related to previous cardiac catheterization. Although this had been stable he appears it had a repeat bleeding episode tonight. Currently there is no evidence of active bleeding in his pain is stable. Given the risk for continued problems and repeated issues with bleeding I would recommend exploring the right thigh hematoma. I would like to give him a small dose of intravenous vitamin K tonight with plans to proceed with exploration tomorrow when his INR is less. I've explained the risks of the procedure included 15% risk of wound healing problems given that the tissue is compromised. In addition I have explained that given that the hematoma has been present since late October that he may still have significant swelling after the procedure is a large component of the hematoma will be difficult to evacuate. I feel that he is at risk for recurrent bleeding problems  given that he is on Coumadin if this is simply observed. Will be sure that it's okay from a cardiac standpoint to proceed with surgery tomorrow she will require a general anesthetic. I've ordered a follow up INR in the morning.  [place] [facility] [place] [facility]: [phone]

## 2011-07-17 NOTE — ED Notes (Signed)
Pt presents to department for evaluation of swelling and pain to R groin. Pt states he had cardiac catherization on 10/27. Recently noticed site has become swollen, hard and painful to touch. No open wounds or bleeding at the time. 5/10 pain at the time. Sensation, pulses and movement intact to R leg. Pt also states mild SOB. Respirations unlabored at the time, becomes labored on exertion. Denies chest pain. Pt conscious alert and oriented x4. No signs of distress at the time.

## 2011-07-17 NOTE — ED Notes (Signed)
New pressure dressing applied to R groin area. Pt tolerated well. Vascular surgeon at the bedside discussing plan of care. Vital signs stable. Pt to be transported to exam room after report.

## 2011-07-17 NOTE — ED Notes (Signed)
Pt returned to exam room. Placed back on cardiac monitor. Vital signs stable.

## 2011-07-17 NOTE — ED Notes (Signed)
Pressure dressing applied to R groin area. Pt tolerated without difficulty. Distal circulation remains intact.

## 2011-07-17 NOTE — ED Notes (Signed)
Floor nurse unable to take report at the time. To call back.

## 2011-07-17 NOTE — ED Notes (Signed)
Pt presents to department via [facility] for R groin pain and possible internal bleeding. Pt states he had cardiac catherization on 10/27. States R groin site is hard and painful to touch. States he has noticed swelling and pain has become worse over the past few days. Sensation and pulses intact to R leg. Able to wiggle digits. 18g RAC. NSR on monitor. 9/10 pain upon arrival to ED. Pt conscious alert and oriented x4. Pt also states SOB with productive cough, wears 2L O2 at home. Family at bedside.

## 2011-07-17 NOTE — H&P (Signed)
[name] is an 76 y.o. male.   Chief Complaint: right groin pain and swelling. HPI: 76 year old white male with recent history of ST elevation MI to the anterior wall with stent placed to the LAD on 06/21/2011, followed by development of hematoma in the right groin. There was no retroperitoneal bleed at that time. Patient was on Brilinta and Coumadin as an outpatient due to new coronary stent and LV thrombus that was found after his MI.  Patient had done well after discharge and right groin hematoma was resolving. He now presents to the emergency room due to new hematoma of the right groin. This morning he was sitting on bar stool and developed hip pain,then right groin pain.  He called and talked to both myself and Dr. Ellyn Hack earlier today and describes the hematoma at the size of an orange but with pressure being held by a calm smaller and softer. The pain he initially had, which was quite severe, improved with hydrocodone. He was instructed to monitor the hematoma and we would arrange for femoral arterial ultrasound in the morning. Though he was instructed to either come to the ER or call us back if it increased in pain or size.  He called later this afternoon and had again increased in size and was quite painful his son who is an EMT held pressure on the site bringing him to the emergency room. Here after another hydrocodone pain is somewhat improved but he does have a large hematoma. L-shaped across the groin and then medially down the right thigh. We will send for stat CT to rule out retroperitoneal bleed if he does have hip pain. Once we have the CT, we will ask the vascular surgeons to evaluate.  We are also obtaining CBC, basic metabolic panel, and protime.  Have discussed these issues with Dr. Ellyn Hack and if need be we can give fresh frozen plasma. Patient needs to continue with Brilinta due to the new stent.  Would like to continue Coumadin secondary to LV thrombus though we will  certainly hold it today until further recommendations can be made.  Currently patient is hemodynamically stable. Pain is improved.    Past Medical History  Diagnosis Date  . Myocardial infarction   . Asthma   . COPD (chronic obstructive pulmonary disease)   . Emphysema   . CAD 04/23/2009  . Anticoagulated on warfarin 07/17/2011  . CHF (congestive heart failure)   . Ventricular thrombus following MI (myocardial infarction) 11/12    on coumadin    Past Surgical History  Procedure Date  . Coronary stent placement 06/21/11  . Cardiac catheterization     History reviewed. No pertinent family history. Social History:  has an unknown smoking status. He has never used smokeless tobacco. He reports that he does not drink alcohol or use illicit drugs.  Allergies:  Allergies  Allergen Reactions  . Demerol     Medications Prior to Admission  Medication Dose Route Frequency Provider Last Rate Last Dose  . 0.9 %  sodium chloride infusion   Intravenous Continuous Isaiah Serge, NP 50 mL/hr at 07/17/11 1735     Medications Prior to Admission  Medication Sig Dispense Refill  . albuterol (PROVENTIL) (2.5 MG/3ML) 0.083% nebulizer solution Take 2.5 mg by nebulization 3 (three) times daily as needed. For coughing       . albuterol-ipratropium (COMBIVENT) 18-103 MCG/ACT inhaler Inhale 2 puffs into the lungs 3 (three) times daily as needed. For coughing       .  aspirin EC 325 MG tablet Take 325 mg by mouth daily.        . carisoprodol (SOMA) 350 MG tablet Take 350 mg by mouth 3 (three) times daily as needed. For back pain       . clopidogrel (PLAVIX) 75 MG tablet Take 75 mg by mouth daily.        Marland Kitchen enoxaparin (LOVENOX) 30 MG/0.3ML SOLN Inject 0.92 mLs (92 mg total) into the skin every 12 (twelve) hours.  18.9 mL  0  . HYDROcodone-acetaminophen (LORCET) 10-650 MG per tablet Take 1 tablet by mouth 3 (three) times daily as needed. For pain       . levalbuterol (XOPENEX HFA) 45 MCG/ACT inhaler  Inhale 2 puffs into the lungs every 6 (six) hours as needed for wheezing.  1 Inhaler  3  . metoprolol tartrate (LOPRESSOR) 12.5 mg TABS Take 0.5 tablets (12.5 mg total) by mouth 2 (two) times daily.  30 tablet  3  . montelukast (SINGULAIR) 10 MG tablet Take 10 mg by mouth daily.        . Multiple Vitamins-Minerals (MULTIVITAMINS THER. W/MINERALS) TABS Take 0.5 tablets by mouth 2 (two) times daily.        . nitroGLYCERIN (NITROSTAT) 0.4 MG SL tablet Place 0.4 mg under the tongue every 5 (five) minutes as needed. For chest pain       . omeprazole (PRILOSEC) 20 MG capsule Take 20 mg by mouth daily.        Marland Kitchen OVER THE COUNTER MEDICATION Place 1 drop into both eyes 2 (two) times daily. Saline Eye drops       . simvastatin (ZOCOR) 80 MG tablet Take 80 mg by mouth daily.        . Ticagrelor (BRILINTA) 90 MG TABS tablet Take 1 tablet (90 mg total) by mouth 2 (two) times daily.  60 tablet  10  . valsartan (DIOVAN) 40 MG tablet Take 1 tablet (40 mg total) by mouth 2 (two) times daily.  60 tablet  3  . warfarin (COUMADIN) 7.5 MG tablet Take 1 tablet (7.5 mg total) by mouth one time only at 6 PM.  30 tablet  5    No results found for this or any previous visit (from the past 48 hour(s)). No results found.  ROS: General: no colds or fevers. Skin: no rashes or ulcers. HEENT: No blurred vision. Cardiovascular: No chest pain. Pulmonary: Denies shortness of breath, though he appears short of breath, he is anxious about his hematoma.also with history of COPD GI: No diarrhea constipation or melena. GU: No hematuria or dysuria. Musculoskeletal: Right groin pain as stated. Neuro: no syncope, no lightheadedness, no dizziness.     Blood pressure 111/82, pulse 90, temperature 98.7 F (37.1 C), resp. rate 23, SpO2 99.00%. PE: General: Alert oriented white male does not appear in acute distress. Skin: Warm and dry brisk capillary refill. HEENT: Normocephalic, sclera clear. Neck: Supple, no JVD, no carotid  bruit. Heart: S1-S2 regular rate and rhythm no obvious murmur. No gallop or click. Lungs: Clear without rales, rhonchi or wheezes.  -- on MD exam, he is wheezing Abdomen: Soft nontender no lower quadrant tenderness, positive bowel sounds. Extremities: Right groin with hematoma at the cath site extending into the medial right thigh. Right leg is warm with 2+ pedal pulse. Left leg also has 2+ pedal pulse. Neuro: Alert, oriented x3, moves all extremities, follows commands.  Assessment/Plan Patient Active Problem List  Diagnoses  . Groin hematoma - s/p LHC &  PCI       DYSLIPIDEMIA  . CAD -- CARDIOMYOPATHY, ISCHEMIC     ST elevation (STEMI) myocardial infarction involving left anterior descending coronary artery with LAD PCI (DES) - RECENT     Post-infarction apical LV thrombus -- Anticoagulated on warfarin     Chronic systolic congestive heart failure  . COPD (chronic obstructive pulmonary disease) - Chronic Shortness of breath  . Acute blood loss anemia - stable   . CKD (chronic kidney disease) stage 3, GFR 30-59 ml/min  . HYPERTENSION  .    PLAN: Primary diagnosis: Acute on Chronic Rt. Groin hematoma.    Admit to step down unit, monitor hematoma, stat ct of abd and pelvis. Ask vascular surgeon to see in consult. check  Cbc, Pro time.  Hold coumadin tonight, if INR significantly elevated give FFP. Continue Brilinta. May need to add fem stop if site continues to enlarge.  Monitor closely.  Have discussed with Dr. Ellyn Hack.   CT reveals increase in size of rt (reviewed). Thigh hematoma.  No retroperitoneal bleed.  Pt on reexam feels better, pain improved.  Hematoma medially is softer.    Dr. Scot Dock is aware of consult.  If continued bleeding will add femstop.  INGOLD,LAURA R 07/17/2011, 5:54 PM  I have seen and examined the patient along with [facility] R, FNP.  I have reviewed the chart, notes and new data.  I agree with Laura's note.  Key new complaints: As described above -  Key  examination changes: R groin now with pressure dressing in place, pain controlled. Hemodynamically stable Key new findings / data:H/H is stable, Hematoma size noted on CT Abd/Pelvis with no retro-periotneal extension.  PLAN: Vascular Surgery consulted for enlarging groin hematoma.  Thanks to Dr. Scot Dock for his assistance.   I agree with the assessment of groin exploration for a persistent & now growing hematoma especially as he will need to be on long term warfarin & ticagrelor for his recent DES & LV thrombus. Hold parameters for BB (do not want to mask tachycardia related to bleeding). Xopenex Nebulizers for COPD  Operative risk by definition with CKD & Ischemic CM is at least moderate. However, in light of his recent Anterior STEMI, as well as severe COPD, he is more likely high (>11%) risk of MI (fatal or non-fatal), Cardiac arrest, CHB. I did discuss this risk assessment with the family, but in the current setting of persistent & worsening hematoma in a patient requiring Anti-platelet therapy along with anticoagulation for LV thrombus, I would concur with Dr. Nicole Cella assessment that the best option for definitive treatment and avoiding any further complication (including infection) is groin exploration.  HARDING,DAVID W 07/17/2011, 10:10 PM

## 2011-07-18 ENCOUNTER — Encounter (HOSPITAL_COMMUNITY): Payer: Self-pay | Admitting: Anesthesiology

## 2011-07-18 ENCOUNTER — Encounter (HOSPITAL_COMMUNITY)
Admission: EM | Disposition: A | Discharge status: Home / Self Care | Payer: Self-pay | Source: Ambulatory Visit | Attending: Cardiology

## 2011-07-18 ENCOUNTER — Inpatient Hospital Stay (HOSPITAL_COMMUNITY): Payer: Medicare Other | Admitting: Anesthesiology

## 2011-07-18 HISTORY — PX: GROIN DEBRIDEMENT: SHX5159

## 2011-07-18 LAB — CBC
HCT: 30.2 % — ABNORMAL LOW (ref 39.0–52.0)
HCT: 28.6 % — ABNORMAL LOW (ref 39.0–52.0)
MCH: 30.9 pg (ref 26.0–34.0)
MCHC: 32.1 g/dL (ref 30.0–36.0)
MCHC: 32.9 g/dL (ref 30.0–36.0)
MCV: 94.1 fL (ref 78.0–100.0)
Platelets: 274 10*3/uL (ref 150–400)
RDW: 16.2 % — ABNORMAL HIGH (ref 11.5–15.5)
RDW: 17.9 % — ABNORMAL HIGH (ref 11.5–15.5)
WBC: 8.7 10*3/uL (ref 4.0–10.5)

## 2011-07-18 LAB — PROTIME-INR
INR: 1.62 — ABNORMAL HIGH (ref 0.00–1.49)
Prothrombin Time: 19.5 seconds — ABNORMAL HIGH (ref 11.6–15.2)

## 2011-07-18 LAB — CARDIAC PANEL(CRET KIN+CKTOT+MB+TROPI)
CK, MB: 3.5 ng/mL (ref 0.3–4.0)
Relative Index: 2.1 (ref 0.0–2.5)
Troponin I: <0.3 ng/mL (ref <0.30)

## 2011-07-18 LAB — BASIC METABOLIC PANEL
BUN: 18 mg/dL (ref 6–23)
Chloride: 108 mEq/L (ref 96–112)
Creatinine, Ser: 1.37 mg/dL — ABNORMAL HIGH (ref 0.50–1.35)
GFR calc Af Amer: 57 mL/min — ABNORMAL LOW (ref >90)
GFR calc non Af Amer: 49 mL/min — ABNORMAL LOW (ref >90)
Potassium: 4 mEq/L (ref 3.5–5.1)

## 2011-07-18 SURGERY — DEBRIDEMENT, INGUINAL REGION
Anesthesia: General | Site: Groin | Laterality: Right | Wound class: Clean

## 2011-07-18 MED ORDER — BISACODYL 10 MG RE SUPP
10.0000 mg | Freq: Once | RECTAL | Status: AC
  Administered 2011-07-18: 10 mg via RECTAL
  Filled 2011-07-18: qty 1

## 2011-07-18 MED ORDER — HEPARIN SODIUM (PORCINE) 1000 UNIT/ML IJ SOLN
INTRAMUSCULAR | Status: DC | PRN
  Administered 2011-07-18: 4000 [IU] via INTRAVENOUS
  Administered 2011-07-18: 3000 [IU] via INTRAVENOUS

## 2011-07-18 MED ORDER — SODIUM CHLORIDE 0.9 % IR SOLN
Status: DC | PRN
  Administered 2011-07-18: 1000 mL

## 2011-07-18 MED ORDER — ONDANSETRON HCL 4 MG/2ML IJ SOLN: 4.0000 mg | Freq: Once | INTRAMUSCULAR | Status: DC | PRN

## 2011-07-18 MED ORDER — LIDOCAINE HCL (CARDIAC) 20 MG/ML IV SOLN
INTRAVENOUS | Status: DC | PRN
  Administered 2011-07-18: 80 mg via INTRAVENOUS

## 2011-07-18 MED ORDER — LACTATED RINGERS IV SOLN
INTRAVENOUS | Status: DC
  Administered 2011-07-19: 500 mL via INTRAVENOUS
  Administered 2011-07-20: via INTRAVENOUS

## 2011-07-18 MED ORDER — MEPERIDINE HCL 25 MG/ML IJ SOLN
6.2500 mg | INTRAMUSCULAR | Status: DC | PRN
  Administered 2011-07-18: 12.5 mg via INTRAVENOUS

## 2011-07-18 MED ORDER — PROPOFOL 10 MG/ML IV EMUL
INTRAVENOUS | Status: DC | PRN
  Administered 2011-07-18: 30 mg via INTRAVENOUS
  Administered 2011-07-18: 50 mg via INTRAVENOUS
  Administered 2011-07-18: 150 mg via INTRAVENOUS

## 2011-07-18 MED ORDER — HEMOSTATIC AGENTS (NO CHARGE) OPTIME
TOPICAL | Status: DC | PRN
  Administered 2011-07-18: 1 via TOPICAL

## 2011-07-18 MED ORDER — SODIUM CHLORIDE 0.9 % IV SOLN
INTRAVENOUS | Status: DC | PRN
  Administered 2011-07-18: 10:00:00 via INTRAVENOUS

## 2011-07-18 MED ORDER — HYDROMORPHONE HCL PF 1 MG/ML IJ SOLN: 0.2500 mg | INTRAMUSCULAR | Status: DC | PRN

## 2011-07-18 MED ORDER — HETASTARCH-ELECTROLYTES 6 % IV SOLN
INTRAVENOUS | Status: DC | PRN
  Administered 2011-07-18: 11:00:00 via INTRAVENOUS

## 2011-07-18 MED ORDER — CEFAZOLIN SODIUM 1-5 GM-% IV SOLN
INTRAVENOUS | Status: AC
  Filled 2011-07-18: qty 50

## 2011-07-18 MED ORDER — PHENYLEPHRINE HCL 10 MG/ML IJ SOLN
INTRAMUSCULAR | Status: DC | PRN
  Administered 2011-07-18 (×10): 80 ug via INTRAVENOUS

## 2011-07-18 MED ORDER — FENTANYL CITRATE 0.05 MG/ML IJ SOLN
INTRAMUSCULAR | Status: DC | PRN
  Administered 2011-07-18 (×4): 25 ug via INTRAVENOUS
  Administered 2011-07-18: 125 ug via INTRAVENOUS
  Administered 2011-07-18: 25 ug via INTRAVENOUS

## 2011-07-18 MED ORDER — ALBUTEROL SULFATE (2.5 MG/3ML) 0.083% IN NEBU
INHALATION_SOLUTION | RESPIRATORY_TRACT | Status: DC | PRN
  Administered 2011-07-18: .5 mg via RESPIRATORY_TRACT
  Administered 2011-07-18: 1 mg via RESPIRATORY_TRACT

## 2011-07-18 MED ORDER — LACTATED RINGERS IV SOLN
INTRAVENOUS | Status: DC | PRN
  Administered 2011-07-18: 12:00:00 via INTRAVENOUS

## 2011-07-18 MED ORDER — ONDANSETRON HCL 4 MG/2ML IJ SOLN
INTRAMUSCULAR | Status: DC | PRN
  Administered 2011-07-18: 4 mg via INTRAVENOUS

## 2011-07-18 MED ORDER — PROTAMINE SULFATE 10 MG/ML IV SOLN
INTRAVENOUS | Status: DC | PRN
  Administered 2011-07-18: 40 mg via INTRAVENOUS

## 2011-07-18 MED ORDER — MORPHINE SULFATE 2 MG/ML IJ SOLN: 0.0500 mg/kg | INTRAMUSCULAR | Status: DC | PRN

## 2011-07-18 SURGICAL SUPPLY — 53 items
BANDAGE GAUZE ELAST BULKY 4 IN (GAUZE/BANDAGES/DRESSINGS) IMPLANT
CANISTER SUCTION 2500CC (MISCELLANEOUS) ×2 IMPLANT
CLIP TI MEDIUM 6 (CLIP) ×4 IMPLANT
CLIP TI WIDE RED SMALL 6 (CLIP) ×2 IMPLANT
CLOTH BEACON ORANGE TIMEOUT ST (SAFETY) ×2 IMPLANT
COVER SURGICAL LIGHT HANDLE (MISCELLANEOUS) ×4 IMPLANT
DERMABOND ADHESIVE PROPEN (GAUZE/BANDAGES/DRESSINGS) ×1
DERMABOND ADVANCED .7 DNX6 (GAUZE/BANDAGES/DRESSINGS) ×1 IMPLANT
DRAIN CHANNEL 19F RND (DRAIN) ×2 IMPLANT
DRAPE INCISE IOBAN 66X45 STRL (DRAPES) ×2 IMPLANT
ELECT REM PT RETURN 9FT ADLT (ELECTROSURGICAL) ×2
ELECTRODE REM PT RTRN 9FT ADLT (ELECTROSURGICAL) ×1 IMPLANT
EVACUATOR SILICONE 100CC (DRAIN) ×2 IMPLANT
GAUZE SPONGE 4X4 12PLY STRL LF (GAUZE/BANDAGES/DRESSINGS) ×2 IMPLANT
GLOVE BIO SURGEON STRL SZ7.5 (GLOVE) ×2 IMPLANT
GLOVE BIOGEL PI IND STRL 6.5 (GLOVE) ×1 IMPLANT
GLOVE BIOGEL PI IND STRL 7.0 (GLOVE) ×2 IMPLANT
GLOVE BIOGEL PI IND STRL 7.5 (GLOVE) ×2 IMPLANT
GLOVE BIOGEL PI INDICATOR 6.5 (GLOVE) ×1
GLOVE BIOGEL PI INDICATOR 7.0 (GLOVE) ×2
GLOVE BIOGEL PI INDICATOR 7.5 (GLOVE) ×2
GLOVE ECLIPSE 6.5 STRL STRAW (GLOVE) ×4 IMPLANT
GLOVE SKINSENSE NS SZ7.5 (GLOVE) ×1
GLOVE SKINSENSE STRL SZ7.5 (GLOVE) ×1 IMPLANT
GOWN STRL NON-REIN LRG LVL3 (GOWN DISPOSABLE) ×8 IMPLANT
KIT BASIN OR (CUSTOM PROCEDURE TRAY) ×2 IMPLANT
KIT ROOM TURNOVER OR (KITS) ×2 IMPLANT
LOOP VESSEL MAXI BLUE (MISCELLANEOUS) ×2 IMPLANT
LOOP VESSEL MINI RED (MISCELLANEOUS) ×2 IMPLANT
NS IRRIG 1000ML POUR BTL (IV SOLUTION) ×2 IMPLANT
PACK GENERAL/GYN (CUSTOM PROCEDURE TRAY) ×2 IMPLANT
PACK UNIVERSAL I (CUSTOM PROCEDURE TRAY) ×2 IMPLANT
PAD ARMBOARD 7.5X6 YLW CONV (MISCELLANEOUS) ×4 IMPLANT
SPONGE GAUZE 4X4 12PLY (GAUZE/BANDAGES/DRESSINGS) IMPLANT
SPONGE LAP 18X18 X RAY DECT (DISPOSABLE) ×2 IMPLANT
SPONGE SURGIFOAM ABS GEL 100 (HEMOSTASIS) ×2 IMPLANT
STAPLER VISISTAT 35W (STAPLE) IMPLANT
SUT ETHILON 3 0 PS 1 (SUTURE) ×2 IMPLANT
SUT PROLENE 5 0 C 1 24 (SUTURE) ×4 IMPLANT
SUT PROLENE 6 0 BV (SUTURE) ×2 IMPLANT
SUT SILK 2 0 (SUTURE) ×1
SUT SILK 2-0 18XBRD TIE 12 (SUTURE) ×1 IMPLANT
SUT SILK 3 0 (SUTURE) ×1
SUT SILK 3-0 18XBRD TIE 12 (SUTURE) ×1 IMPLANT
SUT VIC AB 2-0 CTB1 (SUTURE) ×2 IMPLANT
SUT VIC AB 2-0 CTX 36 (SUTURE) ×4 IMPLANT
SUT VIC AB 3-0 SH 27 (SUTURE) ×2
SUT VIC AB 3-0 SH 27X BRD (SUTURE) ×2 IMPLANT
SUT VICRYL 4-0 PS2 18IN ABS (SUTURE) ×2 IMPLANT
SUTURE AID BOOTIES ×2 IMPLANT
TOWEL OR 17X24 6PK STRL BLUE (TOWEL DISPOSABLE) ×2 IMPLANT
TOWEL OR 17X26 10 PK STRL BLUE (TOWEL DISPOSABLE) ×2 IMPLANT
WATER STERILE IRR 1000ML POUR (IV SOLUTION) ×2 IMPLANT

## 2011-07-18 NOTE — Op Note (Signed)
07/18/2011  PREOP DIAGNOSIS: bleeding from right groin with large hematoma  POSTOP DIAGNOSIS: same (bleeding from right superficial femoral artery)  PROCEDURE: evacuation of hematoma right thigh. Repair of right superficial femoral artery.  SURGEON: Judeth Cornfield. Scot Dock, MD, FACS  ASSIST: none  ANESTHESIA: Gen.   EBL: 800 cc  FINDINGS: there was bleeding from the posterior aspect of the right superficial femoral artery at the site of a branch. This was below the level of the deep femoral artery.  INDICATIONS: this is a pleasant 76 year old gentleman who had undergone cardiac catheterization and had a postoperative hematoma. Given discharge but returned after developing the sudden onset of pain and swelling in the right groin. Is brought for exploration the groin and repair of the artery.  TECHNIQUE: patient was taken to the operating room and received a general anesthetic. The right groin and thigh were prepped and draped in the usual sterile fashion. A longitudinal incision was made in the right groin. Above the level of the hematoma I was able to control the common femoral artery for proximal control. I then entered the hematoma. A large amount of clot and also some fresh blood was evacuated. There was bleeding from the level of the superficial femoral artery. I continued the dissection over the artery such that I could control above and below the hole in the artery. This was into a branch of the superficial femoral artery. The hole in the artery was repaired with 5-0 Prolene suture the distal aspect of the branch was oversewn with a 5-0 Prolene suture. The artery was skeletonized and no other injuries were noted. There was a star closed device in the proximal common femoral artery which may have been from a previous catheterization. The wound was then irrigated and all hematoma evacuated. Hemostasis was obtained. A 19 Blake drain was placed. The wounds closed with 2 deep layers of 2-0 Vicryl  and the skin closed with 4-0 subcuticular stitch. Sterile dressing was applied the patient tolerated the procedure well and was transferred to the recovery room in stable condition. All needle and sponge counts were correct.     Deitra Mayo, MD, FACS [facility]: 07/18/2011 DATE OF DICTATION: 07/18/2011

## 2011-07-18 NOTE — Plan of Care (Signed)
Problem: Phase I Progression Outcomes Goal: Incision/dressings dry and intact Rt groin ace compression dressing intact and dry - pt has large hematoma and ecchymotic area rt thigh.

## 2011-07-18 NOTE — Progress Notes (Signed)
[name] is a 76 y.o. male with a history of recent Ant STEMI (DES to LAD) & Chronic (now worsened) Ischemic Cardiomyopathy with an Apical LV thrombus and COPD, whose post-PCI course was complicated by a large Right thigh hematoma.  The hematoma had been relatively stable until yesterday when there was a sudden episode of pain followed by "growing" of the hematoma.  The symptoms initially abated, but then worsened, so he presented to [facility] ER for evaluation.  CT Abd/Pelvis demonstrated a large (increased in size from previous) R thigh hematoma with no retroperitoneal extension. Dr. Scot Dock from Vascular Surgery graciously came in to assess the patient, and felt that he would be served best by undergoing explorative surgery.  Subjective:  Resting quietly.  Wheezing not as bad.  OR team here to take him to surgery.  Objective:  Vital Signs in the last 24 hours: Temp:  [98.1 F (36.7 C)-99.5 F (37.5 C)] 98.1 F (36.7 C) (11/23 0400) Pulse Rate:  [74-94] 86  (11/23 0200) Resp:  [14-23] 14  (11/23 0200) BP: (88-124)/(51-91) 97/51 mmHg (11/23 0200) SpO2:  [98 %-100 %] 98 % (11/23 0200) Weight:  [91.6 kg (201 lb 15.1 oz)] 201 lb 15.1 oz (91.6 kg) (11/23 0400)  Intake/Output from previous day: 11/22 0701 - 11/23 0700 In: 1115.8 [P.O.:720; I.V.:345.8; IV Piggyback:50] Out: 1300 [Urine:1300] Intake/Output from this shift: Total I/O In: 1115.8 [P.O.:720; I.V.:345.8; IV Piggyback:50] Out: 1300 [Urine:1300]  Physical Exam:  PE: General: Alert oriented white male does not appear in acute distress.  Skin: Warm and dry brisk capillary refill.  HEENT: Normocephalic, sclera clear.  Neck: Supple, no JVD, no carotid bruit.  Heart: S1-S2 regular rate and rhythm no obvious murmur. No gallop or click.  Lungs: Clear without rales, rhonchi or wheezes. -- on MD exam,  wheezing much improved Abdomen: Soft nontender no lower quadrant tenderness, positive bowel sounds.  Extremities: Right groin with  hematoma at the cath site extending into the medial right thigh -- tight wrap in place with firm hematoma @ original puncture site. Right leg is warm with 2+ pedal pulse. Left leg also has 2+ pedal pulse.  Neuro: Alert, oriented x3, moves all extremities, follows commands.   Lab Results:  [facility] 07/17/11 2151 07/17/11 1728  WBC 9.2 10.8*  HGB 9.6* 10.2*  PLT 280 285    Basename 07/17/11 1728  NA 140  K 4.0  CL 106  CO2 24  GLUCOSE 131*  BUN 24*  CREATININE 1.44*    Basename 07/17/11 2151  TROPONINI <0.30    Imaging: Ct Abdomen Pelvis Wo Contrast  07/17/2011  *RADIOLOGY REPORT*  Clinical Data: Right groin hematoma post catheterization 06/21/2011, recurrent swelling and pain today  CT ABDOMEN AND PELVIS WITHOUT CONTRAST  Technique:  Multidetector CT imaging of the abdomen and pelvis was performed following the standard protocol without intravenous contrast. Sagittal and coronal MPR images reconstructed from axial data set.  Comparison: 06/29/2011  Findings: Dependent atelectasis at lung bases, greater on right. Within limits of a nonenhanced exam, no focal abnormalities of the liver, spleen, pancreas, or adrenal glands. Left renal calculi, largest 8 mm diameter and lower pole. No hydronephrosis or ureteral dilatation. Extensive atherosclerotic calcifications without aneurysm. Extensive diverticulosis of descending and sigmoid colon without diverticulitis. Stomach and bowel loops otherwise unremarkable for technique. Normal appendix. No intrapelvic mass or adenopathy. Seed implants at prostate bed.  Large right groin hematoma extending into the upper right thigh, measuring 11.3 x 9.0 cm in greatest axial dimensions  image 97 and extending in excess of 13 cm length. This contains higher attenuation acute blood components and has increased in size since the previous exam when it measured 7.8 cm greatest size. No intrapelvic extension. No hernia or acute bony lesion.  IMPRESSION: Interval  increase in size of right groin hematoma since previous study of 06/29/2011, now measuring 11.3 x 9.0 x >13 cm in size, extend into proximal right thigh. Nonobstructing left renal calculi. Distal colonic diverticulosis. No acute intra abdominal or intrapelvic process.  Original Report Authenticated By: Burnetta Sabin, M.D.   Portable Chest 1 View  07/17/2011  *RADIOLOGY REPORT*  Clinical Data: Chest pain.  Recent MI.  PORTABLE CHEST - 1 VIEW 07/17/2011 2230 hours:  Comparison: Portable chest x-ray 06/28/2011 and 06/23/2011 [place] chest x-ray 10/16/[facility].  Findings: Cardiac silhouette normal in size for the AP portable technique, unchanged.  Thoracic aorta tortuous and atherosclerotic, unchanged.  Prominent central pulmonary arteries, unchanged. Emphysematous changes in the upper lobes.  Stable linear scarring in the right mid lung.  Lungs otherwise clear.  No pleural effusions.  IMPRESSION: COPD/emphysema.  Scarring in the right mid lung.  No acute cardiopulmonary disease.  Original Report Authenticated By: Deniece Portela, M.D.    Cardiac Studies:  Assessment/Plan:  1) R groin Hematoma s/p cath/pci emergently with recurrent bleeding 2) LV thrombus on Coumadin 3) Anemia, acute blood loss 4) CKD  5) COPD 6) Recent STEMI with DES stent to LAD  Follow up INR. Possible surgery today. Appreciate Dr. Nicole Cella input. Further per MD.   LOS: 1 day   STONE,KATHERINE L Rada Hay) 07/18/2011, 6:46 AM  I have seen and examined the patient along with Bari Mantis, NP).  I have reviewed the chart, notes and new data.  I agree with Lenae's note.  Key new complaints: Had brief episode of groin pain this AM.  Wheezing better - but would prefer Nebs to Inhaler Key examination changes: Groin stable - distal thigh feels softer, but original hematoma is > 4cm in diameter & firm Key new findings / data: slight drop in Hgb, but hemodynamically stable.  PLAN: Agree  with proceeding to OR today for explorative surgery.  See pre-op evaluation on H&P. Will need to continue Brilinta (holding ASA) for DES in LAD Once H/H stable post-op, will need to re-initiate warfarin therapy for LV thrombus. Hold parameters for BB. Continue statin & nebulizers.  HARDING,DAVID W 07/18/2011, 8:56 AM

## 2011-07-18 NOTE — Preoperative (Signed)
Beta Blockers   Reason not to administer Beta Blockers:Not Applicable 

## 2011-07-18 NOTE — Transfer of Care (Signed)
Immediate Anesthesia Transfer of Care Note  Patient: [name]  Procedure(s) Performed:  [name] DEBRIDEMENT - exploration of right groin,evacuation of right groin hematoma & repair of right superficial femoral artery  Patient Location: PACU  Anesthesia Type: General  Level of Consciousness: awake, oriented and patient cooperative  Airway & Oxygen Therapy: Patient Spontanous Breathing and Patient connected to face mask oxygen  Post-op Assessment: Report given to PACU RN and Post -op Vital signs reviewed and stable  Post vital signs: Reviewed  Complications: No apparent anesthesia complications

## 2011-07-18 NOTE — Anesthesia Postprocedure Evaluation (Signed)
  Anesthesia Post-op Note  Patient: [name]  Procedure(s) Performed:  [name] DEBRIDEMENT - exploration of right groin,evacuation of right groin hematoma & repair of right superficial femoral artery  Patient Location: PACU  Anesthesia Type: General  Level of Consciousness: awake and alert   Airway and Oxygen Therapy: Patient Spontanous Breathing and Patient connected to nasal cannula oxygen  Post-op Pain: moderate  Post-op Assessment: Post-op Vital signs reviewed, Patient's Cardiovascular Status Stable, Respiratory Function Stable, Patent Airway, No signs of Nausea or vomiting and Pain level controlled  Post-op Vital Signs: Reviewed and stable  Complications: No apparent anesthesia complications

## 2011-07-18 NOTE — Plan of Care (Signed)
Problem: Phase I Progression Outcomes Goal: Vital signs/hemodynamically stable Vs stable

## 2011-07-18 NOTE — Anesthesia Preprocedure Evaluation (Addendum)
Anesthesia Evaluation  Patient identified by MRN, date of birth, ID band Patient awake    Reviewed: Allergy & Precautions, H&P , NPO status , Patient's Chart, lab work & pertinent test results, reviewed documented beta blocker date and time   Airway Mallampati: I TM Distance: >3 FB Neck ROM: Full    Dental  (+) Edentulous Upper, Edentulous Lower and Dental Advisory Given   Pulmonary shortness of breath and Long-Term Oxygen Therapy, asthma , COPD COPD inhaler,    Pulmonary exam normal       Cardiovascular hypertension, Pt. on medications and Pt. on home beta blockers + CAD, + Past MI and +CHF     Neuro/Psych    GI/Hepatic   Endo/Other    Renal/GU      Musculoskeletal   Abdominal   Peds  Hematology   Anesthesia Other Findings   Reproductive/Obstetrics                          Anesthesia Physical Anesthesia Plan  ASA: III  Anesthesia Plan: General   Post-op Pain Management:    Induction: Intravenous  Airway Management Planned: Oral ETT  Additional Equipment:   Intra-op Plan:   Post-operative Plan: Possible Post-op intubation/ventilation  Informed Consent:   Dental advisory given  Plan Discussed with: CRNA and Surgeon  Anesthesia Plan Comments:         Anesthesia Quick Evaluation

## 2011-07-18 NOTE — Anesthesia Procedure Notes (Signed)
Narrative

## 2011-07-18 NOTE — Plan of Care (Signed)
Problem: Phase I Progression Outcomes Goal: Pain controlled with appropriate interventions Pt medicated for rt groin pain -spasm like - intermittent 4-10 level

## 2011-07-18 NOTE — Plan of Care (Signed)
Problem: Phase I Progression Outcomes Goal: Voiding-avoid urinary catheter unless indicated Outcome: Progressing Pt voiding adeq. amts.

## 2011-07-18 NOTE — Interval H&P Note (Signed)
History and Physical Interval Note:  07/18/2011  9:00 AM See consult note from last PM.   [name]  has presented today for surgery, with the diagnosis of Right Groin Hematoma  The various methods of treatment have been discussed with the patient and family. After consideration of risks, benefits and other options for treatment, the patient has consented to  Procedure(s): GROIN DEBRIDEMENT as a surgical intervention .  The patients' history has been reviewed, patient examined, no change in status, stable for surgery.  I have reviewed the patients' chart and labs.  Questions were answered to the patient's satisfaction.     DICKSON,CHRISTOPHER S  MD

## 2011-07-19 LAB — TYPE AND SCREEN
ABO/RH(D): A POS
Antibody Screen: NEGATIVE
Unit division: 0

## 2011-07-19 MED ORDER — HEPARIN (PORCINE) IN NACL 100-0.45 UNIT/ML-% IJ SOLN
1050.0000 [IU]/h | INTRAMUSCULAR | Status: DC
  Administered 2011-07-19: 1050 [IU]/h via INTRAVENOUS
  Filled 2011-07-19 (×2): qty 250

## 2011-07-19 MED ORDER — WARFARIN SODIUM 7.5 MG PO TABS
7.5000 mg | ORAL_TABLET | Freq: Once | ORAL | Status: AC
  Administered 2011-07-19: 7.5 mg via ORAL
  Filled 2011-07-19: qty 1

## 2011-07-19 MED ORDER — LEVALBUTEROL HCL 1.25 MG/0.5ML IN NEBU
1.2500 mg | INHALATION_SOLUTION | Freq: Three times a day (TID) | RESPIRATORY_TRACT | Status: DC
  Administered 2011-07-19 – 2011-07-22 (×10): 1.25 mg via RESPIRATORY_TRACT
  Filled 2011-07-19 (×14): qty 0.5

## 2011-07-19 MED ORDER — HEPARIN (PORCINE) IN NACL 100-0.45 UNIT/ML-% IJ SOLN
1400.0000 [IU]/h | INTRAMUSCULAR | Status: DC
  Administered 2011-07-20: 1550 [IU]/h via INTRAVENOUS
  Administered 2011-07-21: 1200 [IU]/h via INTRAVENOUS
  Administered 2011-07-21: 1550 [IU]/h via INTRAVENOUS
  Administered 2011-07-21: 1200 [IU]/h via INTRAVENOUS
  Filled 2011-07-19 (×5): qty 250

## 2011-07-19 NOTE — Progress Notes (Signed)
ANTICOAGULATION CONSULT NOTE - Initial Consult  Pharmacy Consult for  Heparin and Coumadin Indication:  LV thrombus  Allergies  Allergen Reactions  . Ambien     Pt. Became confused and aggitated  . Demerol     Patient Measurements: Height: 5\' 7"  (170.2 cm) Weight: 201 lb 15.1 oz (91.6 kg) IBW/kg (Calculated) : 66.1  Adjusted Body Weight: 85.3 kg  Vital Signs: Temp: 98.8 F (37.1 C) (11/24 0800) Temp src: Oral (11/24 0800) BP: 80/63 mmHg (11/24 0400) Pulse Rate: 95  (11/24 0400)  Labs:  Basename 07/18/11 1537 07/18/11 0630 07/17/11 2151 07/17/11 1728  HGB 9.4* 9.7* -- --  HCT 28.6* 30.2* 29.4* --  PLT 200 274 280 --  APTT -- -- -- --  LABPROT -- 19.5* -- 22.9*  INR -- 1.62* -- 1.99*  HEPARINUNFRC -- -- -- --  CREATININE -- 1.37* -- 1.44*  CKTOTAL -- 167 135 --  CKMB -- 3.5 3.2 --  TROPONINI -- <0.30 <0.30 --   Estimated Creatinine Clearance: 51.1 ml/min (by C-G formula based on Cr of 1.37).  Medical History: Past Medical History  Diagnosis Date  . Myocardial infarction   . Asthma   . COPD (chronic obstructive pulmonary disease)   . Emphysema   . CAD 04/23/2009  . Anticoagulated on warfarin 07/17/2011  . CHF (congestive heart failure)   . Ventricular thrombus following MI (myocardial infarction) 11/12    on coumadin   s/p LAD stent 06/21/11   Assessment:     Recent LAD stent & LV thrombus. Just discharged 11/9.     Re-admitted 11/22 with right groin hematoma -> Coumadin reversed with Vitamin K 2 mg IV. Now POD #1 evacuation of right groin hematoma.  JP drain to stay in 1 more day.   Brilinta 90 mg BID already resumed, now to resume Coumadin.  Heparin to continue until INR >2. Last Coumadin dose was 11/21 at home, 3.75 mg. Pt had been taking 7.5 mg alternating with 3.75 mg just prior to admission. Admission INR 1.99.   INR 1.62 on 11/23. Daily INR had been scheduled for 6pm, so no INR today.   Goal of Therapy:     Heparin level 0.3-0.5 = low therapeutic  INR 2-3  Plan:     Will begin Heparin without bolus at ~ 12 units/kg/hr = 1050 units/hr.   Heparin level ~ 6hrs after drip begins.    Will resume Coumadin with 7.5 mg tonight.    Changed daily INR to begin in AM 11/25.    Continue daily CBC.      Monitor with you for any re-bleeding.   Arty Baumgartner Pager:  [phone] 07/19/2011,10:37 AM

## 2011-07-19 NOTE — Progress Notes (Signed)
ANTICOAGULATION CONSULT NOTE - Initial Consult  Pharmacy Consult for  Heparin and Coumadin Indication:  LV thrombus  Allergies  Allergen Reactions  . Ambien     Pt. Became confused and aggitated  . Demerol     Patient Measurements: Height: 5\' 7"  (170.2 cm) Weight: 201 lb 15.1 oz (91.6 kg) IBW/kg (Calculated) : 66.1  Adjusted Body Weight: 85.3 kg  Vital Signs: Temp: 98.4 F (36.9 C) (11/24 1600) Temp src: Oral (11/24 1200) BP: 92/60 mmHg (11/24 1700) Pulse Rate: 94  (11/24 1800)  Labs:  Basename 07/19/11 1735 07/18/11 1537 07/18/11 0630 07/17/11 2151 07/17/11 1728  HGB -- 9.4* 9.7* -- --  HCT -- 28.6* 30.2* 29.4* --  PLT -- 200 274 280 --  APTT -- -- -- -- --  LABPROT -- -- 19.5* -- 22.9*  INR -- -- 1.62* -- 1.99*  HEPARINUNFRC <0.10* -- -- -- --  CREATININE -- -- 1.37* -- 1.44*  CKTOTAL -- -- 167 135 --  CKMB -- -- 3.5 3.2 --  TROPONINI -- -- <0.30 <0.30 --   Estimated Creatinine Clearance: 51.1 ml/min (by C-G formula based on Cr of 1.37).  Medical History: Past Medical History  Diagnosis Date  . Myocardial infarction   . Asthma   . COPD (chronic obstructive pulmonary disease)   . Emphysema   . CAD 04/23/2009  . Anticoagulated on warfarin 07/17/2011  . CHF (congestive heart failure)   . Ventricular thrombus following MI (myocardial infarction) 11/12    on coumadin   s/p LAD stent 06/21/11   Assessment:     Recent LAD stent & LV thrombus. Just discharged 11/9.     Re-admitted 11/22 with right groin hematoma -> Coumadin reversed with Vitamin K 2 mg IV. Now POD #1 evacuation of right groin hematoma.  JP drain to stay in 1 more day.   Brilinta 90 mg BID already resumed, heparin to continue until INR >2.  First heparin level is undetectable.   Goal of Therapy:     Heparin level 0.3-0.5 = low therapeutic    INR 2-3  Plan:  Increase Heparin to 1300 units/hr. Check Heparin level 6 hours after increase. Monitor with you for any re-bleeding.   Legrand Como, Pharm.D., BCPS Clinical Pharmacist  Pager [phone] 07/19/2011, 6:57 PM

## 2011-07-19 NOTE — Progress Notes (Signed)
   SUBJECTIVE: Pain well controlled  PHYSICAL EXAM: Filed Vitals:   07/18/11 2100 07/18/11 2108 07/19/11 0000 07/19/11 0400  BP: 112/72  90/60 80/63  Pulse: 100 101 96 95  Temp:   99.5 F (37.5 C) 99.9 F (37.7 C)  TempSrc:      Resp: 21 20 22 16   Weight:      SpO2: 100% 100% 97% 95%   T max = 99.9     JP = 34cc for 12 hrs  Lungs: clear to auscultation Dressing dry. Palpable DP pulse R foot  LABS: Lab Results  Component Value Date   WBC 8.5 07/18/2011   HGB 9.4* 07/18/2011   HCT 28.6* 07/18/2011   MCV 94.1 07/18/2011   PLT 200 07/18/2011   Lab Results  Component Value Date   CREATININE 1.37* 07/18/2011   Lab Results  Component Value Date   INR 1.62* 07/18/2011   ASSESSMENT/PLAN: 1. 1 Day Post-Op s/p: Evacuation of right groin hematoma and repair of R SFA.  2. Mobilize today. Leave JP to be sure that drainage does not increase once he is more mobile. He is at risk for having some lymphatic drainage. 3. Heparin/ Coumadin per primary service. OK to fully anticoagulate from my standpoint.  Deitra Mayo, MD, FACS Beeper: [phone] 07/19/2011

## 2011-07-19 NOTE — Progress Notes (Signed)
Subjective:  Feeling better  Objective:  Vital Signs in the last 24 hours: Temp:  [97.2 F (36.2 C)-99.9 F (37.7 C)] 99.9 F (37.7 C) (11/24 0400) Pulse Rate:  [88-110] 95  (11/24 0400) Resp:  [16-26] 16  (11/24 0400) BP: (80-112)/(29-72) 80/63 mmHg (11/24 0400) SpO2:  [94 %-100 %] 95 % (11/24 0400) FiO2 (%):  [2 %-99 %] 2 % (11/23 1248)  Intake/Output from previous day: 11/23 0701 - 11/24 0700 In: 4040 [P.O.:240; I.V.:2525; Blood:700; IV [id number] Out: 2609 [Urine:1775; Drains:34; Blood:800] Intake/Output from this shift:    Physical Exam: PE: General: Alert oriented white male does not appear in acute distress.   Skin: Warm and dry brisk capillary refill.   HEENT: Normocephalic, sclera clear.   Neck: Supple, no JVD, no carotid bruit.   Heart: S1-S2 regular rate and rhythm no obvious murmur. No gallop or click.   Lungs: Clear without rales, rhonchi or wheezes.  Abdomen: Soft nontender no lower quadrant tenderness, positive bowel sounds.   Extremities: R groin bandage intact and dry Right leg is warm with 2+ pedal pulse. Left leg also has 2+ pedal pulse.   Neuro: Alert, oriented x3, moves all extremities, follows commands.  Lab Results:  Basename 07/18/11 1537 07/18/11 0630  WBC 8.5 8.7  HGB 9.4* 9.7*  PLT 200 274    Basename 07/18/11 0630 07/17/11 1728  NA 140 140  K 4.0 4.0  CL 108 106  CO2 25 24  GLUCOSE 105* 131*  BUN 18 24*  CREATININE 1.37* 1.44*    Basename 07/18/11 0630 07/17/11 2151  TROPONINI <0.30 <0.30   Hepatic Function Panel  Basename 07/17/11 2151  PROT 6.4  ALBUMIN 3.3*  AST 24  ALT 22  ALKPHOS 53  BILITOT 0.5  BILIDIR <0.1  IBILI NOT CALCULATED   No results found for this basename: CHOL in the last 72 hours No results found for this basename: PROTIME in the last 72 hours  Imaging:   Cardiac Studies:  Assessment/Plan:  ) R groin Hematoma s/p cath/pci emergently with recurrent bleeding. Underwent vascular repair 07/18/11    2) LV thrombus Coumadin held  3) Anemia, acute blood loss 4) CKD   5) COPD 6) Recent STEMI with DES stent to LAD Restart Coumadin per pharmacy today. CBC stable, will follow closely. Increase activity today Again, appreciate Dr. Nicole Cella assistance with this patient. Further per MD  LOS: 2 days    STONE,KATHERINE L 07/19/2011, 7:16 AM   I have seen and examined the patient along with Bari Mantis, NP.  I have reviewed the chart, notes and new data.  I agree with NP's note.  Key new complaints: feels much better. Minimal discomfort at the surgical site. He complains of some dyspnea and wants to receive his nebulized bronchodilators 3 times daily as he's used to receiving at home. Key examination changes: surgical site appears very healthy. There is minimal additional drainage in the [place], and this now looks serosanguineous rather than bloody Key new findings / data: hemoglobin is stable in the mid nines  PLAN: Keep on step down. Resume intravenous heparin and warfarin. We'll be ready to discharge home and INR is 2.0 or higher. Monitor closely for rebleeding. Start scheduled bronchodilators as he requests.   Sanda Klein, MD, [place] [phone]  07/19/2011, 9:56 AM

## 2011-07-20 LAB — CBC
HCT: 26.5 % — ABNORMAL LOW (ref 39.0–52.0)
Hemoglobin: 8.6 g/dL — ABNORMAL LOW (ref 13.0–17.0)
MCH: 30.5 pg (ref 26.0–34.0)
MCHC: 32.5 g/dL (ref 30.0–36.0)

## 2011-07-20 LAB — HEPARIN LEVEL (UNFRACTIONATED)
Heparin Unfractionated: 0.22 IU/mL — ABNORMAL LOW (ref 0.30–0.70)
Heparin Unfractionated: 0.47 IU/mL (ref 0.30–0.70)

## 2011-07-20 MED ORDER — WARFARIN SODIUM 7.5 MG PO TABS
7.5000 mg | ORAL_TABLET | Freq: Once | ORAL | Status: AC
  Administered 2011-07-20: 7.5 mg via ORAL
  Filled 2011-07-20: qty 1

## 2011-07-20 MED ORDER — SODIUM CHLORIDE 0.9 % IV SOLN
INTRAVENOUS | Status: DC
  Administered 2011-07-20: 22:00:00 via INTRAVENOUS

## 2011-07-20 NOTE — Progress Notes (Signed)
ANTICOAGULATION CONSULT NOTE - Initial Consult  Pharmacy Consult for  Heparin Indication:  LV thrombus  Allergies  Allergen Reactions  . Ambien     Pt. Became confused and aggitated  . Demerol    Patient Measurements: Height: 5\' 7"  (170.2 cm) Weight: 201 lb 15.1 oz (91.6 kg) IBW/kg (Calculated) : 66.1  Adjusted Body Weight: 85.3 kg  Vital Signs: Temp: 99.4 F (37.4 C) (11/24 2300) Temp src: Oral (11/24 2000) BP: 125/66 mmHg (11/24 2300) Pulse Rate: 88  (11/25 0000)  Labs:  Basename 07/20/11 0044 07/19/11 1735 07/18/11 1537 07/18/11 0630 07/17/11 2151 07/17/11 1728  HGB -- -- 9.4* 9.7* -- --  HCT -- -- 28.6* 30.2* 29.4* --  PLT -- -- 200 274 280 --  APTT -- -- -- -- -- --  LABPROT -- -- -- 19.5* -- 22.9*  INR -- -- -- 1.62* -- 1.99*  HEPARINUNFRC 0.26* <0.10* -- -- -- --  CREATININE -- -- -- 1.37* -- 1.44*  CKTOTAL -- -- -- 167 135 --  CKMB -- -- -- 3.5 3.2 --  TROPONINI -- -- -- <0.30 <0.30 --   Estimated Creatinine Clearance: 51.1 ml/min (by C-G formula based on Cr of 1.37).  Medical History: Past Medical History  Diagnosis Date  . Myocardial infarction   . Asthma   . COPD (chronic obstructive pulmonary disease)   . Emphysema   . CAD 04/23/2009  . Anticoagulated on warfarin 07/17/2011  . CHF (congestive heart failure)   . Ventricular thrombus following MI (myocardial infarction) 11/12    on coumadin   s/p LAD stent 06/21/11  Assessment: 76yo male remains slightly subtherapeutic on heparin for LV thrombus though level increasing.   Goal of Therapy:     Heparin level 0.3-0.5   Plan:  Will increase gtt by ~1 unit/kg/hr to 1400 units/hr and check level in [place], PharmD, BCPS  07/20/2011, 2:28 AM

## 2011-07-20 NOTE — Progress Notes (Addendum)
Patient ID: [name], male   DOB: 08-Mar-1935, 76 y.o.   MRN: [id number]  [facility]  Subjective: Hematoma softening.  Slept well.  Intermittent sharp pain in groin. 8-9 times per day.  Objective: Vital signs in last 24 hours: Temp:  [98.2 F (36.8 C)-99.4 F (37.4 C)] 98.4 F (36.9 C) (11/25 0400) Pulse Rate:  [88-97] 88  (11/25 0000) Resp:  [18-24] 20  (11/25 0400) BP: (92-125)/(54-77) 102/67 mmHg (11/25 0400) SpO2:  [93 %-100 %] 100 % (11/25 0400) Last BM Date: 07/19/11  Intake/Output from previous day: 11/24 0701 - 11/25 0700 In: 2172.9 [P.O.:790; I.V.:1382.9] Out: [id number] [Urine:3050; Drains:40]  Intake/Output this shift: Total I/O In: 936.4 [P.O.:240; I.V.:696.4] Out: 1550 [Urine:1550]  Medications: MAR reviewed  PE: General appearance: alert, cooperative and no distress Lungs: wheezes bilateral LL, improved from 2 d ago. Heart: regular rate and rhythm, S1, S2 normal, no murmur, click, rub or gallop Extremities: No LEE Pulses: 2+ and symmetric Skin:  Right groin.  JP drain present.  Medial thigh hematoma appears to be softening.  Mild tenderness. Abd: soft, nt/nd/nabs  Lab Results:  CBC:  11/25, 0600 Component Value   WBC 8.7   RBC 2.82*   HGB 8.6*   HCT 26.5*   PLT 216   MCV 94.0   MCH 30.5   MCHC 32.5   RDW 17.1*   LYMPHSABS 0.9   MONOABS 1.1*   EOSABS 0.2   BASOSABS 0.0  INR 1.42  Studies/Results:  Assessment/Plan POD #2 s/p evacuation of hematoma right thigh. Repair of right superficial femoral artery.    Principal Problem:   *Right side Groin Hematoma following emergent percutaneous transluminal coronary angioplasty  S/p Groin exploration surgery with hematoma evacuation and ligation of bleeding SFA branch.  Active Problems:   CAD -- CARDIOMYOPATHY, ISCHEMIC   Left Ventricular thrombus s/p Anterior STEMI   DYSLIPIDEMIA   HYPERTENSION         COPD (chronic obstructive pulmonary disease) - on chronic home  O2.   Anticoagulated on warfarin  Plan:   Doing well.  CBC, PT/INR pending. Recommend PT to help with mobilization.  Continue with breathing treatments.    LOS: 3 days    [name] 07/20/2011 6:52 AM  I have seen and examined the patient along with Mason Fuller, PA.  I have reviewed the chart, notes and new data.  I agree with Mason's note.  Key new complaints: Intermittent bouts of groin pain, but not as bad Key examination changes: hematoma soft, drainage minimal. Remains hemodynamically stable Key new findings / data: Hgb drop to 8.6 after remaining stable.  Suspect that this is actually representative of true Hbg with expected intra-op & pre-op blood loss.    PLAN: Slowly titrate up warfarin for INR > 2.0 while monitoring for further drop in H/H.   JP drain to be removed today by Vasc Sgx - will need to be up & ambulate  Continue nebulizer Rx No further titration of cardiac meds.   Keep in CCU until stable with ambulation - consult Cardiac Rehab for ambulation. D/C IVF  [place] [place], [place]  [id number]  [phone]  07/20/2011 9:08 AM

## 2011-07-20 NOTE — Progress Notes (Signed)
ANTICOAGULATION CONSULT NOTE - Follow Up Consult  Pharmacy Consult for   Heparin and Coumadin Indication:   LV thrombus  Allergies  Allergen Reactions  . Ambien     Pt. Became confused and aggitated  . Demerol     Patient Measurements: Height: 5\' 7"  (170.2 cm) Weight: 201 lb 15.1 oz (91.6 kg) IBW/kg (Calculated) : 66.1  Adjusted Body Weight:  85 kg  Vital Signs: Temp: 97.8 F (36.6 C) (11/25 1200) Temp src: Oral (11/25 1200) BP: 104/74 mmHg (11/25 1217) Pulse Rate: 91  (11/25 1217)  Labs:  Basename 07/20/11 1103 07/20/11 0630 07/20/11 0044 07/19/11 1735 07/18/11 1537 07/18/11 0630 07/17/11 2151 07/17/11 1728  HGB -- 8.6* -- -- 9.4* -- -- --  HCT -- 26.5* -- -- 28.6* 30.2* -- --  PLT -- 216 -- -- 200 274 -- --  APTT -- -- -- -- -- -- -- --  LABPROT -- 17.6* -- -- -- 19.5* -- 22.9*  INR -- 1.42 -- -- -- 1.62* -- 1.99*  HEPARINUNFRC 0.22* -- 0.26* <0.10* -- -- -- --  CREATININE -- -- -- -- -- 1.37* -- 1.44*  CKTOTAL -- -- -- -- -- 167 135 --  CKMB -- -- -- -- -- 3.5 3.2 --  TROPONINI -- -- -- -- -- <0.30 <0.30 --   Estimated Creatinine Clearance: 51.1 ml/min (by C-G formula based on Cr of 1.37).   Assessment:     Heparin level is still low on 1400 units/hr = 0.22.     CBC has dropped some, but no bleeding noted.     Drain out today.   POD #2 groin hematoma evacuation.     INR 1.42, but no INR 11/24, so unknown if up or down.     Coumadin resumed with 7.5 mg 11/24. Was taking 7.5 mg alternating with 3.75 mg prior to admission.      Also on Brilinta for recent stent (06/21/11). Goal of Therapy:      Heparin level 0.3-0.5 (low therapeutic)     INR 2-3   Plan:     Will increase heparin drip to 1550 units/hr.    Repeat Coumadin 7.5 mg today.    Next heparin level in 6 hrs.   Next CBC and PT/INR in am.    Will f/u for any re-bleeding.  Arty Baumgartner Pager: [phone] 07/20/2011,1:34 PM

## 2011-07-20 NOTE — Progress Notes (Signed)
ANTICOAGULATION CONSULT NOTE - Follow Up Consult  Pharmacy Consult for   Heparin  Indication:   LV thrombus  Allergies  Allergen Reactions  . Ambien     Pt. Became confused and aggitated  . Demerol     Patient Measurements: Height: 5\' 7"  (170.2 cm) Weight: 201 lb 15.1 oz (91.6 kg) IBW/kg (Calculated) : 66.1  Adjusted Body Weight:  85 kg  Vital Signs: Temp: 98.2 F (36.8 C) (11/25 2000) Temp src: Oral (11/25 2000) BP: 104/67 mmHg (11/25 1621) Pulse Rate: 95  (11/25 1800)  Labs:  Basename 07/20/11 1936 07/20/11 1103 07/20/11 0630 07/20/11 0044 07/18/11 1537 07/18/11 0630 07/17/11 2151  HGB -- -- 8.6* -- 9.4* -- --  HCT -- -- 26.5* -- 28.6* 30.2* --  PLT -- -- 216 -- 200 274 --  APTT -- -- -- -- -- -- --  LABPROT -- -- 17.6* -- -- 19.5* --  INR -- -- 1.42 -- -- 1.62* --  HEPARINUNFRC 0.47 0.22* -- 0.26* -- -- --  CREATININE -- -- -- -- -- 1.37* --  CKTOTAL -- -- -- -- -- 167 135  CKMB -- -- -- -- -- 3.5 3.2  TROPONINI -- -- -- -- -- <0.30 <0.30   Estimated Creatinine Clearance: 51.1 ml/min (by C-G formula based on Cr of 1.37).   Assessment:  Heparin level is therapeutic =0.47 on 1550 units/hr.  No bleeding noted.Marland Kitchen   POD #2 groin hematoma evacuation.  Coumadin resumed with 7.5 mg 11/24. Was taking 7.5 mg  alternating with 3.75 mg prior to admission.      Also on Brilinta for recent stent (06/21/11).  Goal of Therapy:      Heparin level 0.3-0.5 (low therapeutic)     INR 2-3   Plan:     Continue current rate of  heparin drip = 1550 units/hr.    Next heparin level, CBC and INR  with AM labs.     Will f/u for any re-bleeding.  Arman Bogus Pager: [phone] 07/20/2011,9:22 PM

## 2011-07-20 NOTE — Progress Notes (Signed)
   SUBJECTIVE: Pain adequately controlled  PHYSICAL EXAM: Filed Vitals:   07/20/11 0400 07/20/11 0741 07/20/11 0742 07/20/11 0800  BP: 102/67 131/84    Pulse:  92  89  Temp: 98.4 F (36.9 C)  98.7 F (37.1 C)   TempSrc: Oral  Oral   Resp: 20 20  22   Height:      Weight:      SpO2: 100% 98% 99% 99%   JP = 10 cc last shift  Lungs: clear to auscultation Incision looks fine Palpable right DP pulse  LABS: Lab Results  Component Value Date   WBC 8.7 07/20/2011   HGB 8.6* 07/20/2011   HCT 26.5* 07/20/2011   MCV 94.0 07/20/2011   PLT 216 07/20/2011   Lab Results  Component Value Date   CREATININE 1.37* 07/18/2011   Lab Results  Component Value Date   INR 1.42 07/20/2011   ASSESSMENT/PLAN: 1. 2 Days Post-Op s/p: Evacuation of right groin hematoma and repair of R SFA. 2. Continue to mobilize. 3. JP D/C'd.  Deitra Mayo, MD, FACS Beeper: [phone] 07/20/2011

## 2011-07-21 LAB — CBC
HCT: 27.8 % — ABNORMAL LOW (ref 39.0–52.0)
Hemoglobin: 9.1 g/dL — ABNORMAL LOW (ref 13.0–17.0)
RDW: 16.8 % — ABNORMAL HIGH (ref 11.5–15.5)
WBC: 9.6 10*3/uL (ref 4.0–10.5)

## 2011-07-21 LAB — HEPARIN LEVEL (UNFRACTIONATED): Heparin Unfractionated: 0.86 IU/mL — ABNORMAL HIGH (ref 0.30–0.70)

## 2011-07-21 LAB — PROTIME-INR
INR: 1.56 — ABNORMAL HIGH (ref 0.00–1.49)
Prothrombin Time: 19 seconds — ABNORMAL HIGH (ref 11.6–15.2)

## 2011-07-21 MED ORDER — WARFARIN SODIUM 7.5 MG PO TABS
7.5000 mg | ORAL_TABLET | Freq: Once | ORAL | Status: AC
  Administered 2011-07-21: 7.5 mg via ORAL
  Filled 2011-07-21 (×2): qty 1

## 2011-07-21 NOTE — Progress Notes (Signed)
ANTICOAGULATION CONSULT NOTE - Follow Up Consult  Pharmacy Consult for   Heparin  Indication:   LV thrombus   Patient Measurements: Height: 5\' 7"  (170.2 cm) Weight: 201 lb 15.1 oz (91.6 kg) IBW/kg (Calculated) : 66.1  Adjusted Body Weight:  85 kg  Vital Signs: Temp: 97.7 F (36.5 C) (11/26 1100) Temp src: Oral (11/26 1100) BP: 113/69 mmHg (11/26 1150) Pulse Rate: 84  (11/26 1150)  Labs:  Basename 07/21/11 1300 07/21/11 0453 07/20/11 1936 07/20/11 0630 07/18/11 1537  HGB -- 9.1* -- 8.6* --  HCT -- 27.8* -- 26.5* 28.6*  PLT -- 250 -- 216 200  APTT -- -- -- -- --  LABPROT -- 19.0* -- 17.6* --  INR -- 1.56* -- 1.42 --  HEPARINUNFRC 0.49 0.86* 0.47 -- --  CREATININE -- -- -- -- --  CKTOTAL -- -- -- -- --  CKMB -- -- -- -- --  TROPONINI -- -- -- -- --   Estimated Creatinine Clearance: 51.1 ml/min (by C-G formula based on Cr of 1.37).   Assessment:  76 yo male with LV thrombus s/p MI on heparin/coumadn. Heparin at goal (heparin level= 0.49) and INR up slightly. Patient is noted POD #3 hematoma evacuation and on ticagrelor.  Goals Heparin level=0.3-0.7 INR=2-3  Plan:  -No heparin changes -Will continue coumadin 7.5mg  today     Dareen Piano Pager: [phone] 07/21/2011,2:30 PM

## 2011-07-21 NOTE — Progress Notes (Signed)
   SUBJECTIVE: No complaints.  PHYSICAL EXAM: Filed Vitals:   07/20/11 1800 07/20/11 2000 07/21/11 0000 07/21/11 0400  BP:  102/59 102/68 102/61  Pulse: 95 94 88 88  Temp:  98.2 F (36.8 C) 98.6 F (37 C) 98.5 F (36.9 C)  TempSrc:  Oral Oral Oral  Resp:      Height:      Weight:      SpO2: 98% 96% 94% 96%   Lungs: clear to auscultation Right groin incision healing nicely. No erythema or drainage.  LABS: Lab Results  Component Value Date   WBC 9.6 07/21/2011   HGB 9.1* 07/21/2011   HCT 27.8* 07/21/2011   MCV 94.9 07/21/2011   PLT 250 07/21/2011   Lab Results  Component Value Date   CREATININE 1.37* 07/18/2011   Lab Results  Component Value Date   INR 1.56* 07/21/2011   CBG (last 3)  No results found for this basename: GLUCAP:3 in the last 72 hours   ASSESSMENT/PLAN: 1. 3 Days Post-Op s/p: Evacuation of right groin hematoma and repair of R SFA 2. JP removed yesterday. 3. Acute blood loss anemia resolved. 4. Coumadin being regulated. 5. Okay for discharge from vascular standpoint once ambulating and Coumadin regulated.  Deitra Mayo, MD, FACS Beeper: [phone] 07/21/2011

## 2011-07-21 NOTE — Progress Notes (Signed)
Subjective:  No complaints. + BM yesterday  Objective:  Vital Signs in the last 24 hours: Temp:  [97.8 F (36.6 C)-98.7 F (37.1 C)] 98.5 F (36.9 C) (11/26 0400) Pulse Rate:  [88-96] 88  (11/26 0400) Resp:  [19-22] 20  (11/25 1217) BP: (102-131)/(59-84) 102/61 mmHg (11/26 0400) SpO2:  [94 %-100 %] 96 % (11/26 0400)  Intake/Output from previous day: 11/25 0701 - 11/26 0700 In: 1424.5 [P.O.:890; I.V.:534.5] Out: 2925 [Urine:2925] Intake/Output from this shift:    Physical Exam: General appearance: alert, cooperative and no distress Lungs: wheezes bilateral LL, improved from 2 d ago. Heart: regular rate and rhythm, S1, S2 normal, no murmur, click, rub or gallop Extremities: No LEE Pulses: 2+ and symmetric Skin:  Right groin.  Medial thigh hematoma appears to be softening.  Mild tenderness.  JP drain out. Dressing c/d/i Abd: soft, nt/nd/nabs  Lab Results:  Basename 07/21/11 0453 07/20/11 0630  WBC 9.6 8.7  HGB 9.1* 8.6*  PLT 250 216   No results found for this basename: NA:2,K:2,CL:2,CO2:2,GLUCOSE:2,BUN:2,CREATININE:2 in the last 72 hours No results found for this basename: TROPONINI:2,CK,MB:2 in the last 72 hours Hepatic Function Panel No results found for this basename: PROT,ALBUMIN,AST,ALT,ALKPHOS,BILITOT,BILIDIR,IBILI in the last 72 hours No results found for this basename: CHOL in the last 72 hours No results found for this basename: PROTIME in the last 72 hours  Imaging: Imaging results have been reviewed  Cardiac Studies:  Assessment/Plan:  POD #3 s/p evacuation of hematoma right thigh. Repair of right superficial femoral artery.    Principal Problem:             *Right side Groin Hematoma following emergent percutaneous transluminal coronary angioplasty             S/p Groin exploration surgery with hematoma evacuation and ligation of bleeding SFA branch.   Active Problems:                      CAD -- CARDIOMYOPATHY, ISCHEMIC              Left Ventricular  thrombus s/p Anterior STEMI             DYSLIPIDEMIA             HYPERTENSION                                 COPD (chronic obstructive pulmonary disease) - on chronic home O2.             Anticoagulated on warfarin  Continue current therapy. Increase activity, ambulation. Monitor H/H. Further recs per MD.   LOS: 4 days    STONE,KATHERINE L 07/21/2011, 7:14 AM   I have seen and examined the patient along with Bari Mantis, NP.  I have reviewed the chart, notes and new data.  I agree with Lenae's note.  Key new complaints: Had significant leg pain when turning around from sink. - otherwise doing well, breathing better. No Chest Pain Key examination changes: groin softer - still some firmness medially, but not nearly as tender.   Key new findings / data: Hgb 9.1 (likely more realistic compared to 8.6 yesterday); INR 1.56  PLAN: Continue IV Heparin - Warfarin for LV thrombus Continue Ticagrelor, statin & BB Continue with scheduled & prn breathing Rx Transfer to tele & continue Cardiac Rehab therapy  Dispo: anticipate d/c home once INR therapeutic & H/H stable.  Delbert Vu  Viona Gilmore, M.D., M.S. [facility] [address] [place], [place]  [address]  [phone]  07/21/2011 9:57 AM

## 2011-07-21 NOTE — Progress Notes (Signed)
ANTICOAGULATION CONSULT NOTE - Follow Up Consult  Pharmacy Consult for   Heparin  Indication:   LV thrombus  Allergies  Allergen Reactions  . Ambien     Pt. Became confused and aggitated  . Demerol     Patient Measurements: Height: 5\' 7"  (170.2 cm) Weight: 201 lb 15.1 oz (91.6 kg) IBW/kg (Calculated) : 66.1  Adjusted Body Weight:  85 kg  Vital Signs: Temp: 98.5 F (36.9 C) (11/26 0400) Temp src: Oral (11/26 0400) BP: 102/61 mmHg (11/26 0400) Pulse Rate: 88  (11/26 0400)  Labs:  Basename 07/21/11 0453 07/20/11 1936 07/20/11 1103 07/20/11 0630 07/18/11 1537 07/18/11 0630  HGB 9.1* -- -- 8.6* -- --  HCT 27.8* -- -- 26.5* 28.6* --  PLT 250 -- -- 216 200 --  APTT -- -- -- -- -- --  LABPROT 19.0* -- -- 17.6* -- 19.5*  INR 1.56* -- -- 1.42 -- 1.62*  HEPARINUNFRC 0.86* 0.47 0.22* -- -- --  CREATININE -- -- -- -- -- 1.37*  CKTOTAL -- -- -- -- -- 167  CKMB -- -- -- -- -- 3.5  TROPONINI -- -- -- -- -- <0.30   Estimated Creatinine Clearance: 51.1 ml/min (by C-G formula based on Cr of 1.37).   Assessment: heparin level is supratherapeutic this am at 0.86 hgb/hct stable no further bleeding reported    Goal of Therapy:      Heparin level 0.3-0.5 (low therapeutic)     INR 2-3   Plan:     Decrease heparin to 1200 units/hr and recheck in 6 hours Curlene Dolphin Pager: [phone] 07/21/2011,6:16 AM

## 2011-07-21 NOTE — Progress Notes (Signed)
Followed up with a patient from several weeks ago. Patient stated he is coping okay. Follow up as needed.

## 2011-07-22 ENCOUNTER — Encounter (HOSPITAL_COMMUNITY): Payer: Self-pay | Admitting: Vascular Surgery

## 2011-07-22 ENCOUNTER — Inpatient Hospital Stay (HOSPITAL_COMMUNITY): Payer: Medicare Other

## 2011-07-22 DIAGNOSIS — R0902 Hypoxemia: Secondary | ICD-10-CM

## 2011-07-22 DIAGNOSIS — J810 Acute pulmonary edema: Secondary | ICD-10-CM

## 2011-07-22 DIAGNOSIS — J449 Chronic obstructive pulmonary disease, unspecified: Secondary | ICD-10-CM

## 2011-07-22 DIAGNOSIS — J4489 Other specified chronic obstructive pulmonary disease: Secondary | ICD-10-CM

## 2011-07-22 DIAGNOSIS — J209 Acute bronchitis, unspecified: Secondary | ICD-10-CM

## 2011-07-22 LAB — CBC
MCH: 31.4 pg (ref 26.0–34.0)
Platelets: 277 10*3/uL (ref 150–400)
RBC: 2.87 MIL/uL — ABNORMAL LOW (ref 4.22–5.81)
RDW: 16.7 % — ABNORMAL HIGH (ref 11.5–15.5)
WBC: 7.8 10*3/uL (ref 4.0–10.5)

## 2011-07-22 LAB — PROTIME-INR: Prothrombin Time: 25.1 seconds — ABNORMAL HIGH (ref 11.6–15.2)

## 2011-07-22 MED ORDER — WARFARIN SODIUM 2.5 MG PO TABS
2.5000 mg | ORAL_TABLET | Freq: Once | ORAL | Status: AC
  Administered 2011-07-22: 2.5 mg via ORAL
  Filled 2011-07-22: qty 1

## 2011-07-22 MED ORDER — FLUTICASONE PROPIONATE HFA 220 MCG/ACT IN AERO
4.0000 | INHALATION_SPRAY | Freq: Two times a day (BID) | RESPIRATORY_TRACT | Status: DC
  Administered 2011-07-22 – 2011-07-23 (×2): 4 via RESPIRATORY_TRACT
  Filled 2011-07-22: qty 12

## 2011-07-22 MED ORDER — IPRATROPIUM BROMIDE 0.02 % IN SOLN: 0.5000 mg | RESPIRATORY_TRACT | Status: DC | PRN

## 2011-07-22 MED ORDER — IPRATROPIUM BROMIDE 0.02 % IN SOLN
0.5000 mg | Freq: Four times a day (QID) | RESPIRATORY_TRACT | Status: DC
  Administered 2011-07-22 – 2011-07-23 (×3): 0.5 mg via RESPIRATORY_TRACT
  Filled 2011-07-22 (×3): qty 2.5

## 2011-07-22 MED ORDER — ALBUTEROL SULFATE (5 MG/ML) 0.5% IN NEBU: 2.5000 mg | INHALATION_SOLUTION | RESPIRATORY_TRACT | Status: DC | PRN

## 2011-07-22 MED ORDER — LEVALBUTEROL HCL 1.25 MG/0.5ML IN NEBU
1.2500 mg | INHALATION_SOLUTION | Freq: Four times a day (QID) | RESPIRATORY_TRACT | Status: DC
  Administered 2011-07-22 – 2011-07-23 (×3): 1.25 mg via RESPIRATORY_TRACT
  Filled 2011-07-22 (×7): qty 0.5

## 2011-07-22 NOTE — Clinical Documentation Improvement (Signed)
RESPIRATORY FAILURE DOCUMENTATION CLARIFICATION QUERY   THIS DOCUMENT IS NOT A PERMANENT PART OF THE MEDICAL RECORD  Please update your documentation within the medical record to reflect your response to this query.                                                                                     07/22/11  Dear Dr.Harding Rolley Sims,  In a better effort to capture your patient's severity of illness, reflect appropriate length of stay and utilization of resources, a review of the patient medical record has revealed the following indicators.    Based on your clinical judgment, please clarify and document in a progress note and/or discharge summary the clinical condition associated with the following supporting information:  In responding to this query please exercise your independent judgment.  The fact that a query is asked, does not imply that any particular answer is desired or expected.  Possible Clinical Conditions?  _______Chronic Respiratory Failure  _______Other Condition  _______Cannot Clinically Determine    Supporting Information:   Chronic Home O2 noted per 11/25 progress note. SOB, dyspneic this morning per 11/27 progress note.               You may use possible, probable, or suspect with inpatient documentation. possible, probable, suspected diagnoses MUST be documented at the time of discharge  Reviewed: Per 07/23/11 progress note, physician plans to d/c home today; query unanswered.  Thank You,  Sincerely, Theron Arista,  Clinical Documentation Specialist:  Pager: [place]:  1. If needed, update documentation for the patient's encounter via the notes activity.  2. Access this query again and click edit on the [employer].  3. After updating, or not, click F2 to complete all highlighted (required) fields concerning your review.  Select "additional documentation in the medical record" OR "no additional documentation provided".  4. Click Sign note button.  5. The deficiency will fall out of your InBasket *Please let us know if you are not able to compete this workflow by phone or e-mail (listed below).

## 2011-07-22 NOTE — Progress Notes (Signed)
   SUBJECTIVE: No complaints  PHYSICAL EXAM: Filed Vitals:   07/21/11 1343 07/21/11 2115 07/21/11 2150 07/22/11 0412  BP:   110/71 110/83  Pulse:  82 91 98  Temp:   98.2 F (36.8 C) 99.1 F (37.3 C)  TempSrc:   Oral Oral  Resp:  18 18 21   Height:      Weight:      SpO2: 100%  93% 94%   Afeb 97.7 Incision looks fine  LABS: Lab Results  Component Value Date   WBC 7.8 07/22/2011   HGB 9.0* 07/22/2011   HCT 27.4* 07/22/2011   MCV 95.5 07/22/2011   PLT 277 07/22/2011   Lab Results  Component Value Date   CREATININE 1.37* 07/18/2011   Lab Results  Component Value Date   INR 2.23* 07/22/2011   ASSESSMENT/PLAN: 1. 4 Days Post-Op s/p: Evacuation of right groin hematoma and repair of R SFA 2. Coumadin therapeutic 3. OK to D/C from Vascular standpoint. I will arrange F/U.  Deitra Mayo, MD, FACS Beeper: [phone] 07/22/2011

## 2011-07-22 NOTE — Progress Notes (Signed)
Heparin level this am down to 0.24 will increase to 1400 units/hr and recheck in 6 hours. No issues reported at this time.

## 2011-07-22 NOTE — Progress Notes (Signed)
Subjective:  SOB this am.  Objective:  Vital Signs in the last 24 hours: Temp:  [97.7 F (36.5 C)-99.1 F (37.3 C)] 99.1 F (37.3 C) (11/27 0412) Pulse Rate:  [82-98] 98  (11/27 0412) Resp:  [18-21] 21  (11/27 0412) BP: (107-113)/(56-83) 110/83 mmHg (11/27 0412) SpO2:  [93 %-100 %] 94 % (11/27 0412)  Intake/Output from previous day: 11/26 0701 - 11/27 0700 In: 1023.6 [P.O.:720; I.V.:303.6] Out: 750 [Urine:750]  Physical Exam: General appearance: alert, cooperative and mild distress Lungs: decreased breath sounds overall Heart: regular rate and rhythm, S1, S2 normal, no murmur, click, rub or gallop Extremities: dressing dry Rt thigh   Rate: 88  Rhythm: normal sinus rhythm  Lab Results:  Basename 07/22/11 0525 07/21/11 0453  WBC 7.8 9.6  HGB 9.0* 9.1*  PLT 277 250   No results found for this basename: NA:2,K:2,CL:2,CO2:2,GLUCOSE:2,BUN:2,CREATININE:2 in the last 72 hours No results found for this basename: TROPONINI:2,CK,MB:2 in the last 72 hours Hepatic Function Panel No results found for this basename: PROT,ALBUMIN,AST,ALT,ALKPHOS,BILITOT,BILIDIR,IBILI in the last 72 hours No results found for this basename: CHOL in the last 72 hours  Basename 07/22/11 0525  INR 2.23*    Imaging: No results found.  Cardiac Studies:  Assessment/Plan:   Principal Problem:  *Right side Groin Hematoma following emergent percutaneous transluminal coronary angioplasty 06/21/11.   S/P evacuation of hematoma right thigh. Repair of right superficial femoral artery.  Active Problems:  CAD  CARDIOMYOPATHY, ISCHEMIC  COPD (chronic obstructive pulmonary disease)  DYSLIPIDEMIA  HYPERTENSION  Shortness of breath  Left Ventricular thrombus s/p Anterior STEMI  Anticoagulated on warfarin   Plan- d/c heparin. Breathing Rx this am. Consider pulmonary consult if he doesn't improve.   Kerin Ransom PA-C 07/22/2011, 9:10 AM

## 2011-07-22 NOTE — Progress Notes (Addendum)
Pt. Seen and examined. Agree with the NP/PA-C note as written.  He was dyspneic this morning .Marland Kitchen Improved with breathing treatments. Will continue these scheduled as per RT. Check chest x-ray today. Groin site is improving, leg seems less enlarged. Ambulate today. INR therapeutic, can DC heparin gtts. Large jump in INR .Marland Kitchen Pharmacy may have to reduce the dose of coumadin tonight. Probably home tomorrow.  Pixie Casino, MD Attending Cardiologist [facility]

## 2011-07-22 NOTE — Clinical Documentation Improvement (Signed)
CHF DOCUMENTATION CLARIFICATION QUERY  THIS DOCUMENT IS NOT A PERMANENT PART OF THE MEDICAL RECORD  Please update your documentation within the medical record to reflect your response to this query.                                                                                     07/22/11  Dear Dr.Harding/ [facility],  In a better effort to capture your patient's severity of illness, reflect appropriate length of stay and utilization of resources, a review of the patient medical record has revealed the following indicators the diagnosis of Heart Failure.    Based on your clinical judgment, please clarify and document in a progress note and/or discharge summary the clinical condition associated with the following supporting information:  In responding to this query please exercise your independent judgment.  The fact that a query is asked, does not imply that any particular answer is desired or expected.  Possible Clinical Conditions?   Chronic Systolic Congestive Heart Failure Chronic Diastolic Congestive Heart Failure Chronic Systolic & Diastolic Congestive Heart Failure Acute Systolic Congestive Heart Failure Acute Diastolic Congestive Heart Failure Acute Systolic & Diastolic Congestive Heart Failure Acute on Chronic Systolic Congestive Heart Failure Acute on Chronic Diastolic Congestive Heart Failure Acute on Chronic Systolic & Diastolic  Congestive Heart Failure Other Condition________________________________________ Cannot Clinically Determine  Supporting Information:  Risk Factors:  CHF noted per 11/22 progress note.  Please specify: Systolic vs Diastolic and Acute vs Chronic in the progress notes.   Reviewed: Per 07/23/11 progress notes, physician plans to d/c home today, no response to query. Thank You,  Sincerely, Theron Arista,  Clinical Documentation Specialist:  Pager: [place]:  1. If needed, update documentation for the patient's encounter via the notes activity.  2. Access this query again and click edit on the [employer].  3. After updating, or not, click F2 to complete all highlighted (required) fields concerning your review. Select "additional documentation in the medical record" OR "no additional documentation provided".  4. Click Sign note button.  5. The deficiency will fall out of your InBasket *Please let us know if you are not able to compete this workflow by phone or e-mail (listed below).

## 2011-07-22 NOTE — Progress Notes (Signed)
ANTICOAGULATION CONSULT NOTE - Follow Up Consult  Pharmacy Consult for Warfarin Indication: LV thrombus  Allergies  Allergen Reactions  . Ambien     Pt. Became confused and aggitated  . Demerol     Patient Measurements: Height: 5\' 7"  (170.2 cm) Weight: 201 lb 15.1 oz (91.6 kg) IBW/kg (Calculated) : 66.1   Vital Signs: Temp: 99.1 F (37.3 C) (11/27 0412) Temp src: Oral (11/27 0412) BP: 104/58 mmHg (11/27 1011) Pulse Rate: 103  (11/27 1011)  Labs:  Basename 07/22/11 0525 07/21/11 1300 07/21/11 0453 07/20/11 0630  HGB 9.0* -- 9.1* --  HCT 27.4* -- 27.8* 26.5*  PLT 277 -- 250 216  APTT -- -- -- --  LABPROT 25.1* -- 19.0* 17.6*  INR 2.23* -- 1.56* 1.42  HEPARINUNFRC 0.24* 0.49 0.86* --  CREATININE -- -- -- --  CKTOTAL -- -- -- --  CKMB -- -- -- --  TROPONINI -- -- -- --   Estimated Creatinine Clearance: 51.1 ml/min (by C-G formula based on Cr of 1.37).  Warfarin History:  11/24: Warfarin 7.5 mg (INR 1.62 >> 1.42) 11/25: Warfarin 7.5 mg (INR 1.42 >> 1.56) 11/26: Warfarin 7.5 mg (INR 1.56 >> 2.23)  Assessment: 76 y.o. M on warfarin for LV thrombus with a therapeutic INR this a.m. Heparin IV was being used to bridge to therapeutic INR and was d/ced this a.m. The patient is POD#4 s/p evacuation of right groin hematoma. No new signs/symptoms of bleeding noted. Given large jump in INR--will reduce dose today.  Goal of Therapy:  INR 2-3   Plan:  1. Warfarin 2.5 mg x 1 dose at 1800 today 2. Will continue to monitor for any signs/symptoms of bleeding and will follow up with PT/INR in the a.m.   Alycia Rossetti Ann 07/22/2011,10:21 AM

## 2011-07-22 NOTE — Consult Note (Signed)
HISTORY of PRESENT ILLNESS:  [name] is a 76 y.o. male with PMH of CAD, ICM, HLD, HTN, admitted on 07/17/2011 with R side groin hematoma following emgergent PCTA on 10/27.  S/P evacuation of hematoma in R thigh and repair of R superficial femoral artery 11/23.  Has had shortness of breath during both hospital admissions.  PCCM consulted for SOB / COPD mgmt.      Past Medical History  Diagnosis Date  . Myocardial infarction   . Asthma   . COPD (chronic obstructive pulmonary disease)   . Emphysema   . CAD 04/23/2009  . Anticoagulated on warfarin 07/17/2011  . CHF (congestive heart failure)   . Ventricular thrombus following MI (myocardial infarction) 11/12    on coumadin    Past Surgical History  Procedure Date  . Coronary stent placement 06/21/11  . Cardiac catheterization     Family History  Problem Relation Age of Onset  . Diabetes type II Mother   . Coronary artery disease Mother   . Coronary artery disease Father   . Diabetes type II Brother   . Coronary artery disease Brother   . Craniosynostosis Neg Hx      reports that he quit smoking about 3 months ago. He has never used smokeless tobacco. He reports that he does not drink alcohol or use illicit drugs.  Allergies  Allergen Reactions  . Ambien     Pt. Became confused and aggitated  . Demerol    Current Medications    . carisoprodol  350 mg Oral TID  . docusate sodium  100 mg Oral BID  . levalbuterol  1.25 mg Nebulization TID  . metoprolol tartrate  12.5 mg Oral BID  . montelukast  10 mg Oral Daily  . pantoprazole  40 mg Oral Q1200  . rosuvastatin  20 mg Oral Daily  . Ticagrelor  90 mg Oral BID  . warfarin  2.5 mg Oral ONCE-1800  . warfarin  7.5 mg Oral ONCE-1800     ROS: Constitutional:   No  weight loss, night sweats,  Fevers, chills, fatigue, lassitude. HEENT:   No headaches,  Difficulty swallowing,  Tooth/dental problems,  Sore throat,                No sneezing, itching, ear ache, nasal  congestion, post nasal drip,  CV:  No chest pain,  Orthopnea, PND, swelling in lower extremities, anasarca, dizziness, palpitations  GI  No heartburn, indigestion, abdominal pain, nausea, vomiting, diarrhea, change in bowel habits, loss of appetite  Resp: c/o SOB, end exp wheezes and productive cough.  Skin: no rash or lesions.  GU: no dysuria, change in color of urine, no urgency or frequency.  No flank pain.  MS:  No joint pain or swelling.  No decreased range of motion.  No back pain.  Psych:  No change in mood or affect. No depression or anxiety.  No memory loss.   Blood pressure 104/58, pulse 103, temperature 99.1 F (37.3 C), temperature source Oral, resp. rate 21, height 5\' 7"  (1.702 m), weight 201 lb 15.1 oz (91.6 kg), SpO2 96.00%.  PHYICAL EXAM: General: Well appearing, NAD. Neuro: Alert, oriented and moving all extremities. CV: RRR, Nl S1/S2, -M/R/G. PULM: End exp wheezes. GI: Soft, NT, ND and +BS. Extremities: -edema and -tenderness.  LABS BMET    Component Value Date/Time   NA 140 07/18/2011 0630   K 4.0 07/18/2011 0630   CL 108 07/18/2011 0630   CO2 25 07/18/2011  0630   GLUCOSE 105* 07/18/2011 0630   BUN 18 07/18/2011 0630   CREATININE 1.37* 07/18/2011 0630   CALCIUM 8.2* 07/18/2011 0630   GFRNONAA 49* 07/18/2011 0630   GFRAA 57* 07/18/2011 0630   CBC    Component Value Date/Time   WBC 7.8 07/22/2011 0525   RBC 2.87* 07/22/2011 0525   HGB 9.0* 07/22/2011 0525   HCT 27.4* 07/22/2011 0525   PLT 277 07/22/2011 0525   MCV 95.5 07/22/2011 0525   MCH 31.4 07/22/2011 0525   MCHC 32.8 07/22/2011 0525   RDW 16.7* 07/22/2011 0525   LYMPHSABS 0.9 07/17/2011 1728   MONOABS 1.1* 07/17/2011 1728   EOSABS 0.2 07/17/2011 1728   BASOSABS 0.0 07/17/2011 1728   ABG    Component Value Date/Time   PHART 7.436 06/28/2011 2124   HCO3 23.3 06/28/2011 2124   TCO2 24 06/28/2011 2124   ACIDBASEDEF 1.0 06/28/2011 2124   O2SAT 95.0 06/28/2011 2124    RADIOLOGIC  DATA 11/22 CXR>>>changes c/w COPD, bibasilar airspace disease   ASSESSMENT/PLAN:  1. COPD-  PLAN: - Increase xopenex to Q6 & Q3 PRN. - Add atrovent nebs Q6 & Q3 PRN. - Check cxr r/o infiltrate. - Flovent 220 mcg one puff BID. - Please insure pulmonary f/u upon discharge for PFT's. - Will see again in AM.  2. CAD s/p emergent PCTA 10/27 with R groin hematoma.  S/P removal of hematoma and repair of R superficial femoral artery 11/23.    PLAN: - Recommendations per Cardiology & VVS.  Noe Gens, NP-C [facility] Pgr: [phone]  Patient seen and examined, agree with above note.  I dictated the care and orders written for this patient under my direction.  Jennet Maduro, M.D.

## 2011-07-23 LAB — BASIC METABOLIC PANEL
BUN: 13 mg/dL (ref 6–23)
CO2: 25 mEq/L (ref 19–32)
Calcium: 8 mg/dL — ABNORMAL LOW (ref 8.4–10.5)
Chloride: 107 mEq/L (ref 96–112)
Creatinine, Ser: 1.34 mg/dL (ref 0.50–1.35)
GFR calc Af Amer: 59 mL/min — ABNORMAL LOW (ref >90)
GFR calc non Af Amer: 51 mL/min — ABNORMAL LOW (ref >90)
Glucose, Bld: 102 mg/dL — ABNORMAL HIGH (ref 70–99)
Potassium: 3.7 mEq/L (ref 3.5–5.1)
Sodium: 140 mEq/L (ref 135–145)

## 2011-07-23 LAB — CBC
HCT: 27.4 % — ABNORMAL LOW (ref 39.0–52.0)
Hemoglobin: 8.9 g/dL — ABNORMAL LOW (ref 13.0–17.0)
MCH: 30.7 pg (ref 26.0–34.0)
MCHC: 32.5 g/dL (ref 30.0–36.0)
MCV: 94.5 fL (ref 78.0–100.0)
RBC: 2.9 MIL/uL — ABNORMAL LOW (ref 4.22–5.81)

## 2011-07-23 LAB — PRO B NATRIURETIC PEPTIDE: Pro B Natriuretic peptide (BNP): 1124 pg/mL — ABNORMAL HIGH (ref 0–125)

## 2011-07-23 MED ORDER — ALBUTEROL SULFATE (5 MG/ML) 0.5% IN NEBU: 2.5000 mg | INHALATION_SOLUTION | RESPIRATORY_TRACT | Status: DC | PRN

## 2011-07-23 MED ORDER — METOPROLOL TARTRATE 12.5 MG HALF TABLET: 12.5000 mg | ORAL_TABLET | Freq: Two times a day (BID) | ORAL | Status: DC

## 2011-07-23 MED ORDER — IPRATROPIUM BROMIDE 0.02 % IN SOLN: 0.5000 mg | RESPIRATORY_TRACT | Status: DC | PRN

## 2011-07-23 MED ORDER — LEVALBUTEROL HCL 1.25 MG/0.5ML IN NEBU: 1.2500 mg | INHALATION_SOLUTION | Freq: Four times a day (QID) | RESPIRATORY_TRACT | Status: DC

## 2011-07-23 MED ORDER — FLUTICASONE PROPIONATE HFA 220 MCG/ACT IN AERO: 4.0000 | INHALATION_SPRAY | Freq: Two times a day (BID) | RESPIRATORY_TRACT | Status: DC

## 2011-07-23 MED ORDER — IPRATROPIUM BROMIDE 0.02 % IN SOLN: 0.5000 mg | Freq: Four times a day (QID) | RESPIRATORY_TRACT | Status: DC

## 2011-07-23 NOTE — Discharge Summary (Signed)
Physician Discharge Summary  Patient ID: [name] MRN: [id number] DOB/AGE: 1934-11-23 76 y.o.  Admit date: 07/17/2011 Discharge date: 07/23/2011  Admission Diagnoses:  Discharge Diagnoses:  Principal Problem:  *Right side Groin Hematoma following emergent percutaneous transluminal coronary angioplasty.   S/P evacuation of hematoma right thigh. Repair of right superficial femoral artery.  Active Problems:  Left Ventricular thrombus s/p Anterior STEMI  DYSLIPIDEMIA  HYPERTENSION  CAD  CARDIOMYOPATHY, ISCHEMIC  Shortness of breath  COPD (chronic obstructive pulmonary disease)  Anticoagulated on warfarin   Discharged Condition: stable  Hospital Course:  Patient is a 76 year old Caucasian male with history of ST elevation myocardial infarction acute anterior wall with stent placed to the LAD on 06/21/2011. Following this he developed a right groin hematoma. He was also found to have a LV thrombus. This was discharged on Prolixin Coumadin at that time. The patient presented back to the emergency room with increase in right groin hematoma development of hip pain and pain in the right groin.  Patient was taken for CT scan of the abdomen and pelvis. This showed large right groin hematoma extending into the upper right thigh measuring 11.3 x 9.0 cm in the greatest axial dimensions and extending 13 cm in length.this is an interval increase in size when compared to the previous study on November fourth 2012.patient seen by Dr. Scot Dock from vascular surgery. Patient was subsequently given intravenous vitamin K and taken to surgery on November 23 for evacuation of right thigh hematoma and repair of right superficial femoral artery.the patient continued to improve.  Nebulized bronchodilators were initiated according to patient's home regimen. Coumadin was reinitiated.pulmonary consult was requested due to increasing dyspnea. Adjustments were made to bronchodilators.  Current patients in stable  condition INR is therapeutic. Patient be discharged home with followups with pulmonary, cardiology, and vascular surgery.  Consults: Pulmonary, Vascular Surgery  Significant Diagnostic Studies:  CT abdomen pelvis Findings:  Dependent atelectasis at lung bases, greater on right.  Within limits of a nonenhanced exam, no focal abnormalities of the  liver, spleen, pancreas, or adrenal glands. Left renal calculi,  largest 8 mm diameter and lower pole.  No hydronephrosis or ureteral dilatation.  Extensive atherosclerotic calcifications without aneurysm.  Extensive diverticulosis of descending and sigmoid colon without  diverticulitis.  Stomach and bowel loops otherwise unremarkable for technique.  Normal appendix.  No intrapelvic mass or adenopathy.  Seed implants at prostate bed.  Large right groin hematoma extending into the upper right thigh,  measuring 11.3 x 9.0 cm in greatest axial dimensions image 97 and  extending in excess of 13 cm length.  This contains higher attenuation acute blood components and has  increased in size since the previous exam when it measured 7.8 cm  greatest size.  No intrapelvic extension.  No hernia or acute bony lesion.  IMPRESSION:  Interval increase in size of right groin hematoma since previous  study of 06/29/2011, now measuring 11.3 x 9.0 x >13 cm in size,  extend into proximal right thigh.  Nonobstructing left renal calculi.  Distal colonic diverticulosis.  No acute intra abdominal or intrapelvic process.  Chest x-ray 07/22/2011 IMPRESSION:  Emphysematous and minimal chronic interstitial lung disease  changes.  No acute abnormalities.  Discharge Exam: Blood pressure 110/56, pulse 80, temperature 98.3 F (36.8 C), temperature source Oral, resp. rate 19, height 5\' 7"  (1.702 m), weight 91.6 kg (201 lb 15.1 oz), SpO2 94.00%.   Disposition: Home-Health Care Svc  Discharge Orders    Future Appointments: Provider: Department:  Dept Phone:  Center:   08/18/2011 11:00 AM [facility] [phone] None   08/18/2011 11:45 AM Kathee Delton, MD [facility] [phone] None     Future Orders Please Complete By Expires   Diet - low sodium heart healthy      Increase activity slowly      No dressing needed      No wound care      Scheduling Instructions:   No soaking in a bathtub of pool.   Call MD for:  redness, tenderness, or signs of infection (pain, swelling, redness, odor or green/yellow discharge around incision site)        Current Discharge Medication List    START taking these medications   Details  albuterol (PROVENTIL) (5 MG/ML) 0.5% nebulizer solution Take 0.5 mLs (2.5 mg total) by nebulization every 3 (three) hours as needed for wheezing or shortness of breath. Qty: 20 mL, Refills: 5    fluticasone (FLOVENT HFA) 220 MCG/ACT inhaler Inhale 4 puffs into the lungs 2 (two) times daily. Qty: 1 Inhaler, Refills: 5    !! ipratropium (ATROVENT) 0.02 % nebulizer solution Take 2.5 mLs (0.5 mg total) by nebulization every 6 (six) hours. Qty: 75 mL, Refills: 10    !! ipratropium (ATROVENT) 0.02 % nebulizer solution Take 2.5 mLs (0.5 mg total) by nebulization every 3 (three) hours as needed. Qty: 75 mL, Refills: 5    levalbuterol (XOPENEX) 1.25 MG/0.5ML nebulizer solution Take 1.25 mg by nebulization every 6 (six) hours. Qty: 1 each, Refills: 10     !! - Potential duplicate medications found. Please discuss with provider.    CONTINUE these medications which have CHANGED   Details  metoprolol tartrate (LOPRESSOR) 12.5 mg TABS Take 0.5 tablets (12.5 mg total) by mouth 2 (two) times daily. Qty: 30 tablet, Refills: 3      CONTINUE these medications which have NOT CHANGED   Details  albuterol-ipratropium (COMBIVENT) 18-103 MCG/ACT inhaler Inhale 2 puffs into the lungs 3 (three) times daily as needed. For coughing     carisoprodol (SOMA) 350 MG tablet Take 350 mg by mouth 3 (three) times  daily as needed. For back pain     montelukast (SINGULAIR) 10 MG tablet Take 10 mg by mouth daily.      Multiple Vitamins-Minerals (MULTIVITAMINS THER. W/MINERALS) TABS Take 0.5 tablets by mouth 2 (two) times daily.      nitroGLYCERIN (NITROSTAT) 0.4 MG SL tablet Place 0.4 mg under the tongue every 5 (five) minutes as needed. For chest pain     omeprazole (PRILOSEC) 20 MG capsule Take 20 mg by mouth daily.      OVER THE COUNTER MEDICATION Place 1 drop into both eyes 2 (two) times daily. Saline Eye drops     simvastatin (ZOCOR) 80 MG tablet Take 80 mg by mouth daily.      Ticagrelor (BRILINTA) 90 MG TABS tablet Take 1 tablet (90 mg total) by mouth 2 (two) times daily. Qty: 60 tablet, Refills: 10    valsartan (DIOVAN) 40 MG tablet Take 1 tablet (40 mg total) by mouth 2 (two) times daily. Qty: 60 tablet, Refills: 3    warfarin (COUMADIN) 7.5 MG tablet Take 1 tablet (7.5 mg total) by mouth one time only at 6 PM. Qty: 30 tablet, Refills: 5      STOP taking these medications     albuterol (PROVENTIL) (2.5 MG/3ML) 0.083% nebulizer solution      aspirin EC 325 MG tablet  clopidogrel (PLAVIX) 75 MG tablet      enoxaparin (LOVENOX) 30 MG/0.3ML SOLN      HYDROcodone-acetaminophen (LORCET) 10-650 MG per tablet      levalbuterol (XOPENEX HFA) 45 MCG/ACT inhaler        Follow-up Information    Follow up with Kathee Delton, MD on 08/18/2011. (1100am - PFT's then see Dr. [name] )    Contact information:   [facility].  INR check on Friday, November 30.        The office will call you with the appointment times and the date to follow up with   Dr. [name].  SignedBrett Canales 07/23/2011, 10:51 AM

## 2011-07-23 NOTE — Progress Notes (Signed)
   SUBJECTIVE: Patient without complaints.  PHYSICAL EXAM: Filed Vitals:   07/22/11 2001 07/22/11 2122 07/23/11 0214 07/23/11 0639  BP: 124/79   108/70  Pulse: 89   89  Temp: 98 F (36.7 C)   98.3 F (36.8 C)  TempSrc: Oral   Oral  Resp: 20   19  Height:      Weight:      SpO2: 95% 95% 94% 92%   Lungs: clear to auscultation Incision looks fine. Palpable right dorsalis pedis pulse.  LABS: Lab Results  Component Value Date   WBC 6.2 07/23/2011   HGB 8.9* 07/23/2011   HCT 27.4* 07/23/2011   MCV 94.5 07/23/2011   PLT 287 07/23/2011   Lab Results  Component Value Date   CREATININE 1.34 07/23/2011   Lab Results  Component Value Date   INR 2.27* 07/23/2011   ASSESSMENT/PLAN: 1. 5 Days Post-Op s/p: Evacuation of right groin hematoma and repair of R SFA 2. OK for discharge from vascular surgery standpoint. I'll arrange follow up in 2-3 weeks.  Deitra Mayo, MD, FACS Beeper: [phone] 07/23/2011

## 2011-07-23 NOTE — Progress Notes (Signed)
Patient ID: [name], male   DOB: Aug 17, 1935, 76 y.o.   MRN: [id number]   [facility]  Subjective: Feels a lot better.  Breathing better.  Objective: Vital signs in last 24 hours: Temp:  [98 F (36.7 C)-98.5 F (36.9 C)] 98.3 F (36.8 C) (11/28 0639) Pulse Rate:  [89-103] 89  (11/28 0639) Resp:  [18-20] 19  (11/28 0639) BP: (104-124)/(58-79) 108/70 mmHg (11/28 0639) SpO2:  [92 %-96 %] 92 % (11/28 0639) FiO2 (%):  [28 %] 28 % (11/27 1454) Last BM Date: 07/22/11  Intake/Output from previous day: 11/27 0701 - 11/28 0700 In: 1560 [P.O.:1560] Out: 801 [Urine:800; Stool:1]  Intake/Output this shift: Total I/O In: 360 [P.O.:360] Out: 350 [Urine:350]  Medications Current Facility-Administered Medications  Medication Dose Route Frequency Provider Last Rate Last Dose  . 0.9 %  sodium chloride infusion   Intravenous Continuous Leonie Man 10 mL/hr at 07/21/11 0800    . albuterol (PROVENTIL) (5 MG/ML) 0.5% nebulizer solution 2.5 mg  2.5 mg Nebulization Q3H PRN Noe Gens, NP      . ALPRAZolam Duanne Moron) tablet 0.5 mg  0.5 mg Oral QHS PRN Isaiah Serge, NP   0.5 mg at 07/18/11 2047  . alum & mag hydroxide-simeth (MAALOX/MYLANTA) 200-200-20 MG/5ML suspension 30 mL  30 mL Oral Q6H PRN Isaiah Serge, NP      . carisoprodol (SOMA) tablet 350 mg  350 mg Oral TID Isaiah Serge, NP   350 mg at 07/22/11 2159  . docusate sodium (COLACE) capsule 100 mg  100 mg Oral BID Isaiah Serge, NP   100 mg at 07/22/11 2159  . fluticasone (FLOVENT HFA) 220 MCG/ACT inhaler 4 puff  4 puff Inhalation BID Wesam Yacoub   4 puff at 07/22/11 2120  . HYDROcodone-acetaminophen (NORCO) 10-325 MG per tablet 1 tablet  1 tablet Oral Q6H PRN Leonie Man   1 tablet at 07/22/11 1342  . ipratropium (ATROVENT) nebulizer solution 0.5 mg  0.5 mg Nebulization Q6H Noe Gens, NP   0.5 mg at 07/23/11 [id number]  . ipratropium (ATROVENT) nebulizer solution 0.5 mg  0.5 mg Nebulization Q3H  PRN Noe Gens, NP      . levalbuterol (XOPENEX) nebulizer solution 1.25 mg  1.25 mg Nebulization Q6H Noe Gens, NP   1.25 mg at 07/23/11 0213  . metoprolol tartrate (LOPRESSOR) tablet 12.5 mg  12.5 mg Oral BID Leonie Man   12.5 mg at 07/22/11 2159  . montelukast (SINGULAIR) tablet 10 mg  10 mg Oral Daily Isaiah Serge, NP   10 mg at 07/22/11 1011  . morphine 2 MG/ML injection 2 mg  2 mg Intravenous Q2H PRN Isaiah Serge, NP   2 mg at 07/20/11 1417  . nitroGLYCERIN (NITROSTAT) SL tablet 0.4 mg  0.4 mg Sublingual Q5 min PRN Isaiah Serge, NP      . ondansetron [facility]) tablet 4 mg  4 mg Oral Q6H PRN Isaiah Serge, NP       Or  . ondansetron [facility]) injection 4 mg  4 mg Intravenous Q6H PRN Isaiah Serge, NP      . pantoprazole (PROTONIX) EC tablet 40 mg  40 mg Oral Q1200 Isaiah Serge, NP   40 mg at 07/22/11 1155  . rosuvastatin (CRESTOR) tablet 20 mg  20 mg Oral Daily Isaiah Serge, NP   20 mg at 07/22/11 1011  . Ticagrelor (BRILINTA) tablet 90 mg  90 mg  Oral BID Isaiah Serge, NP   90 mg at 07/22/11 2159  . warfarin (COUMADIN) tablet 2.5 mg  2.5 mg Oral ONCE-1800 Lawson Radar, PHARMD   2.5 mg at 07/22/11 1701  . DISCONTD: albuterol-ipratropium (COMBIVENT) inhaler 2 puff  2 puff Inhalation TID PRN Isaiah Serge, NP      . DISCONTD: heparin ADULT infusion 100 units/mL (25000 units/250 mL)  1,400 Units/hr Intravenous Continuous Curlene Dolphin, RPH   1,200 Units/hr at 07/21/11 2202  . DISCONTD: levalbuterol (XOPENEX) nebulizer solution 1.25 mg  1.25 mg Nebulization TID Mihai Croitoru   1.25 mg at 07/22/11 1451    PE: General appearance: alert, cooperative and no distress Lungs: wheezes LLL and LUL Heart: regular rate and rhythm, S1, S2 normal, no murmur, click, rub or gallop Extremities: No LEE.  2+ right DP pulse. 1+ left DP Right groin hematoma.  Softer. No errythema.  Lab Results:   Basename 07/23/11 0522 07/22/11 0525 07/21/11 0453  WBC 6.2 7.8 9.6  HGB  8.9* 9.0* 9.1*  HCT 27.4* 27.4* 27.8*  PLT 287 277 250   BMET  Basename 07/23/11 0522  NA 140  K 3.7  CL 107  CO2 25  GLUCOSE 102*  BUN 13  CREATININE 1.34  CALCIUM 8.0*   PT/INR  Basename 07/23/11 0522 07/22/11 0525 07/21/11 0453  LABPROT 25.4* 25.1* 19.0*  INR 2.27* 2.23* 1.56*   Cholesterol No results found for this basename: CHOL in the last 72 hours Cardiac Enzymes No components found with this basename: TROPONIN:3, CKMB:3  Studies/Results: CXR 07/22/11: Findings: Borderline enlargement of cardiac silhouette.  Tortuous aorta.  Pulmonary vascularity normal.  Calcified granuloma right mid lung.  Lungs mildly emphysematous with no evidence of infiltrate or  pleural effusion.  Minimal chronic interstitial prominence stable.  No pneumothorax.  Bones demineralized.  IMPRESSION:  Emphysematous and minimal chronic interstitial lung disease  changes.  No acute abnormalities.   Assessment/Plan  Principal Problem:  *Right side Groin Hematoma following emergent percutaneous transluminal coronary angioplasty.   S/P evacuation of hematoma right thigh. Repair of right superficial femoral artery.  Active Problems:  Left Ventricular thrombus s/p Anterior STEMI  DYSLIPIDEMIA  HYPERTENSION  CAD  CARDIOMYOPATHY, ISCHEMIC  Shortness of breath  COPD (chronic obstructive pulmonary disease)  Anticoagulated on warfarin   Plan:  BP and HR stable and controlled. INR therapeutic.  No acute CXR findings.  Pulmonary adjusted meds and set F/U appt.  Leg looks better.   LOS: 6 days    HAGER,BRYAN W 07/23/2011 9:01 AM  Clinically feels well. No CP, breathing better.  Will add low dose ARB with valsartan 40 mg post MI and titrate as outpt.  Will D/C today.  F/U with Dr. Scot Dock and myself.  Patient seen and examined. Agree with assessment and plan. Netasha Wehrli A 07/23/2011 9:26 AM

## 2011-07-23 NOTE — Progress Notes (Signed)
Subjective: Feeling much better.  Wants to go home.  Breathing feels much better than his baseline.    Objective: Filed Vitals:   07/22/11 2001 07/22/11 2122 07/23/11 0214 07/23/11 0639  BP: 124/79   108/70  Pulse: 89   89  Temp: 98 F (36.7 C)   98.3 F (36.8 C)  TempSrc: Oral   Oral  Resp: 20   19  Height:      Weight:      SpO2: 95% 95% 94% 92%    Intake/Output from previous day: 11/27 0701 - 11/28 0700 In: 1560 [P.O.:1560] Out: 801 [Urine:800; Stool:1]   Exam: General: chronically ill appearing male, NAD OOB in chair Neuro: awake and alert, appropriate, MAE CV: s1s2 rrr, no m/r/g  PULM: resps even non labored on RA, diminished throughout, rare exp wheeze GI: abd round, soft, +bs Extremities:  Warm and dry, R groin incision c/d, +bppp   Lab Results: CBC    Component Value Date/Time   WBC 6.2 07/23/2011 0522   RBC 2.90* 07/23/2011 0522   HGB 8.9* 07/23/2011 0522   HCT 27.4* 07/23/2011 0522   PLT 287 07/23/2011 0522   MCV 94.5 07/23/2011 0522   MCH 30.7 07/23/2011 0522   MCHC 32.5 07/23/2011 0522   RDW 16.2* 07/23/2011 0522   LYMPHSABS 0.9 07/17/2011 1728   MONOABS 1.1* 07/17/2011 1728   EOSABS 0.2 07/17/2011 1728   BASOSABS 0.0 07/17/2011 1728      BMET  Basename 07/23/11 0522  NA 140  K 3.7  CL 107  CO2 25  GLUCOSE 102*  BUN 13  CREATININE 1.34  CALCIUM 8.0*    Studies/Results: Dg Chest 2 View  07/22/2011  *RADIOLOGY REPORT*  Clinical Data: Wheezing, dyspnea, cough, history asthma, COPD, CHF, coronary artery disease post MI  CHEST - 2 VIEW  Comparison: 07/17/2011  Findings: Borderline enlargement of cardiac silhouette. Tortuous aorta. Pulmonary vascularity normal. Calcified granuloma right mid lung. Lungs mildly emphysematous with no evidence of infiltrate or pleural effusion. Minimal chronic interstitial prominence stable. No pneumothorax. Bones demineralized.  IMPRESSION: Emphysematous and minimal chronic interstitial lung disease changes. No  acute abnormalities.  Original Report Authenticated By: Burnetta Sabin, M.D.     Assessment/Plan: Patient Active Hospital Problem List:  Right side Groin Hematoma following emergent percutaneous transluminal coronary angioplasty.   S/P evacuation of hematoma right thigh. Repair of right superficial femoral artery.  (07/17/2011)   Plan: Per [facility]  CAD (04/23/2009) - s/p emergent percutaneous transluminal coronary angioplasty   Plan: Per cards. On coumadin. Plan d/c home per cards  COPD (chronic obstructive pulmonary disease) (06/29/2011)   Assessment: Improved from baseline resp status per pt.    Plan: Ok for d/c home per cards outpt pulm f/u with PFT's as scheduled Flovent 220 mcg one puff BID xopenex nebs Q6 & Q3 PRN. (per pt has nebulizer machine at home) atrovent nebs Q6 & Q3 PRN Cont O2 qhs and PRN    WHITEHEART,KATHRYN 07/23/2011, 9:00 AM   Pt seen and examined and database reviewed. I agree with above findings, assessment and plan  Merton Border, MD;  PCCM service; [place] [phone]

## 2011-07-23 NOTE — Progress Notes (Signed)
Pt discharged per MD order and per hospital protocol.  Pt and wife verbalized understanding of discharge instructions follow up apointments and ome meds

## 2011-07-24 ENCOUNTER — Encounter: Payer: Self-pay | Admitting: Vascular Surgery

## 2011-07-28 ENCOUNTER — Encounter (HOSPITAL_COMMUNITY): Payer: Self-pay | Admitting: Unknown Physician Specialty

## 2011-07-28 ENCOUNTER — Other Ambulatory Visit: Payer: Self-pay

## 2011-07-28 ENCOUNTER — Inpatient Hospital Stay (HOSPITAL_COMMUNITY)
Admission: EM | Admit: 2011-07-28 | Discharge: 2011-07-31 | DRG: 246 | Disposition: A | Discharge status: Home / Self Care | Payer: Medicare Other | Source: Ambulatory Visit | Attending: Cardiology | Admitting: Cardiology

## 2011-07-28 ENCOUNTER — Encounter (HOSPITAL_COMMUNITY)
Admission: EM | Disposition: A | Discharge status: Home / Self Care | Payer: Self-pay | Source: Ambulatory Visit | Attending: Cardiology

## 2011-07-28 ENCOUNTER — Ambulatory Visit (HOSPITAL_COMMUNITY): Admit: 2011-07-28 | Payer: Self-pay | Admitting: Cardiology

## 2011-07-28 DIAGNOSIS — J449 Chronic obstructive pulmonary disease, unspecified: Secondary | ICD-10-CM | POA: Diagnosis present

## 2011-07-28 DIAGNOSIS — I129 Hypertensive chronic kidney disease with stage 1 through stage 4 chronic kidney disease, or unspecified chronic kidney disease: Secondary | ICD-10-CM | POA: Diagnosis present

## 2011-07-28 DIAGNOSIS — C61 Malignant neoplasm of prostate: Secondary | ICD-10-CM | POA: Diagnosis present

## 2011-07-28 DIAGNOSIS — I2109 ST elevation (STEMI) myocardial infarction involving other coronary artery of anterior wall: Secondary | ICD-10-CM | POA: Diagnosis present

## 2011-07-28 DIAGNOSIS — E785 Hyperlipidemia, unspecified: Secondary | ICD-10-CM | POA: Diagnosis present

## 2011-07-28 DIAGNOSIS — I509 Heart failure, unspecified: Secondary | ICD-10-CM | POA: Diagnosis present

## 2011-07-28 DIAGNOSIS — D62 Acute posthemorrhagic anemia: Secondary | ICD-10-CM | POA: Diagnosis present

## 2011-07-28 DIAGNOSIS — N183 Chronic kidney disease, stage 3 unspecified: Secondary | ICD-10-CM | POA: Insufficient documentation

## 2011-07-28 DIAGNOSIS — Z79899 Other long term (current) drug therapy: Secondary | ICD-10-CM

## 2011-07-28 DIAGNOSIS — Y849 Medical procedure, unspecified as the cause of abnormal reaction of the patient, or of later complication, without mention of misadventure at the time of the procedure: Secondary | ICD-10-CM | POA: Diagnosis present

## 2011-07-28 DIAGNOSIS — I240 Acute coronary thrombosis not resulting in myocardial infarction: Secondary | ICD-10-CM | POA: Diagnosis present

## 2011-07-28 DIAGNOSIS — T82897A Other specified complication of cardiac prosthetic devices, implants and grafts, initial encounter: Principal | ICD-10-CM | POA: Diagnosis present

## 2011-07-28 DIAGNOSIS — I10 Essential (primary) hypertension: Secondary | ICD-10-CM | POA: Diagnosis present

## 2011-07-28 DIAGNOSIS — J4489 Other specified chronic obstructive pulmonary disease: Secondary | ICD-10-CM | POA: Diagnosis present

## 2011-07-28 DIAGNOSIS — I2510 Atherosclerotic heart disease of native coronary artery without angina pectoris: Secondary | ICD-10-CM | POA: Diagnosis present

## 2011-07-28 DIAGNOSIS — I97638 Postprocedural hematoma of a circulatory system organ or structure following other circulatory system procedure: Secondary | ICD-10-CM | POA: Diagnosis present

## 2011-07-28 DIAGNOSIS — I213 ST elevation (STEMI) myocardial infarction of unspecified site: Secondary | ICD-10-CM | POA: Diagnosis not present

## 2011-07-28 DIAGNOSIS — Z7901 Long term (current) use of anticoagulants: Secondary | ICD-10-CM

## 2011-07-28 DIAGNOSIS — I2589 Other forms of chronic ischemic heart disease: Secondary | ICD-10-CM | POA: Diagnosis present

## 2011-07-28 DIAGNOSIS — I5022 Chronic systolic (congestive) heart failure: Secondary | ICD-10-CM | POA: Diagnosis present

## 2011-07-28 DIAGNOSIS — I5189 Other ill-defined heart diseases: Secondary | ICD-10-CM | POA: Diagnosis present

## 2011-07-28 HISTORY — DX: Pneumonia, unspecified organism: J18.9

## 2011-07-28 HISTORY — DX: Essential (primary) hypertension: I10

## 2011-07-28 HISTORY — PX: PERCUTANEOUS CORONARY STENT INTERVENTION (PCI-S): SHX5485

## 2011-07-28 HISTORY — DX: Malignant (primary) neoplasm, unspecified: C80.1

## 2011-07-28 HISTORY — DX: Chronic kidney disease, unspecified: N18.9

## 2011-07-28 HISTORY — DX: Unspecified osteoarthritis, unspecified site: M19.90

## 2011-07-28 HISTORY — DX: Angina pectoris, unspecified: I20.9

## 2011-07-28 HISTORY — DX: Reserved for inherently not codable concepts without codable children: IMO0001

## 2011-07-28 HISTORY — DX: Shortness of breath: R06.02

## 2011-07-28 HISTORY — DX: Anemia, unspecified: D64.9

## 2011-07-28 HISTORY — PX: CORONARY ANGIOGRAM: SHX5466

## 2011-07-28 HISTORY — DX: Encounter for other specified aftercare: Z51.89

## 2011-07-28 HISTORY — DX: Gastro-esophageal reflux disease without esophagitis: K21.9

## 2011-07-28 LAB — LIPID PANEL
Cholesterol: 126 mg/dL (ref 0–200)
Total CHOL/HDL Ratio: 3.7 RATIO
Triglycerides: 189 mg/dL — ABNORMAL HIGH (ref <150)

## 2011-07-28 LAB — COMPREHENSIVE METABOLIC PANEL
ALT: 15 U/L (ref 0–53)
AST: 16 U/L (ref 0–37)
Albumin: 3 g/dL — ABNORMAL LOW (ref 3.5–5.2)
Alkaline Phosphatase: 53 U/L (ref 39–117)
Calcium: 7.7 mg/dL — ABNORMAL LOW (ref 8.4–10.5)
GFR calc Af Amer: 64 mL/min — ABNORMAL LOW (ref >90)
Potassium: 4.2 mEq/L (ref 3.5–5.1)
Sodium: 141 mEq/L (ref 135–145)
Total Protein: 5.7 g/dL — ABNORMAL LOW (ref 6.0–8.3)

## 2011-07-28 LAB — CARDIAC PANEL(CRET KIN+CKTOT+MB+TROPI)
CK, MB: 3 ng/mL (ref 0.3–4.0)
CK, MB: 34.7 ng/mL (ref 0.3–4.0)
Total CK: 349 U/L — ABNORMAL HIGH (ref 7–232)
Troponin I: <0.3 ng/mL (ref <0.30)
Troponin I: 15.63 ng/mL (ref <0.30)

## 2011-07-28 LAB — PROTIME-INR: INR: 2.21 — ABNORMAL HIGH (ref 0.00–1.49)

## 2011-07-28 LAB — URINALYSIS, ROUTINE W REFLEX MICROSCOPIC
Glucose, UA: NEGATIVE mg/dL
Ketones, ur: NEGATIVE mg/dL
Leukocytes, UA: NEGATIVE
pH: 8 (ref 5.0–8.0)

## 2011-07-28 LAB — CBC
MCH: 31.1 pg (ref 26.0–34.0)
MCHC: 32.4 g/dL (ref 30.0–36.0)
Platelets: 391 10*3/uL (ref 150–400)
RDW: 16.1 % — ABNORMAL HIGH (ref 11.5–15.5)

## 2011-07-28 LAB — PREPARE RBC (CROSSMATCH)

## 2011-07-28 LAB — POCT I-STAT, CHEM 8
BUN: 21 mg/dL (ref 6–23)
Chloride: 104 mEq/L (ref 96–112)
Sodium: 141 mEq/L (ref 135–145)

## 2011-07-28 LAB — URINE MICROSCOPIC-ADD ON

## 2011-07-28 LAB — APTT: aPTT: 30 seconds (ref 24–37)

## 2011-07-28 LAB — MRSA PCR SCREENING: MRSA by PCR: NEGATIVE

## 2011-07-28 LAB — POCT ACTIVATED CLOTTING TIME: Activated Clotting Time: 606 seconds

## 2011-07-28 SURGERY — CORONARY ANGIOGRAM

## 2011-07-28 MED ORDER — IPRATROPIUM BROMIDE 0.02 % IN SOLN
0.5000 mg | RESPIRATORY_TRACT | Status: DC | PRN
Start: 2011-07-28 — End: 2011-07-31

## 2011-07-28 MED ORDER — SODIUM CHLORIDE 0.9 % IV SOLN: INTRAVENOUS | Status: DC

## 2011-07-28 MED ORDER — METOPROLOL TARTRATE 12.5 MG HALF TABLET
12.5000 mg | ORAL_TABLET | Freq: Two times a day (BID) | ORAL | Status: DC
  Administered 2011-07-28 – 2011-07-30 (×4): 12.5 mg via ORAL
  Filled 2011-07-28 (×6): qty 1

## 2011-07-28 MED ORDER — MONTELUKAST SODIUM 10 MG PO TABS
10.0000 mg | ORAL_TABLET | Freq: Every day | ORAL | Status: DC
  Administered 2011-07-29 – 2011-07-31 (×3): 10 mg via ORAL
  Filled 2011-07-28 (×3): qty 1

## 2011-07-28 MED ORDER — ALBUTEROL SULFATE (5 MG/ML) 0.5% IN NEBU
2.5000 mg | INHALATION_SOLUTION | RESPIRATORY_TRACT | Status: DC | PRN
  Filled 2011-07-28: qty 0.5

## 2011-07-28 MED ORDER — TICAGRELOR 90 MG PO TABS
90.0000 mg | ORAL_TABLET | Freq: Two times a day (BID) | ORAL | Status: DC
  Administered 2011-07-28 – 2011-07-31 (×6): 90 mg via ORAL
  Filled 2011-07-28 (×8): qty 1

## 2011-07-28 MED ORDER — PANTOPRAZOLE SODIUM 40 MG PO TBEC
40.0000 mg | DELAYED_RELEASE_TABLET | Freq: Every day | ORAL | Status: DC
  Administered 2011-07-28 – 2011-07-31 (×4): 40 mg via ORAL
  Filled 2011-07-28 (×4): qty 1

## 2011-07-28 MED ORDER — OLMESARTAN MEDOXOMIL 5 MG PO TABS
5.0000 mg | ORAL_TABLET | Freq: Every day | ORAL | Status: DC
  Administered 2011-07-28 – 2011-07-31 (×4): 5 mg via ORAL
  Filled 2011-07-28 (×4): qty 1

## 2011-07-28 MED ORDER — ROSUVASTATIN CALCIUM 20 MG PO TABS
20.0000 mg | ORAL_TABLET | Freq: Every day | ORAL | Status: DC
  Administered 2011-07-28 – 2011-07-30 (×3): 20 mg via ORAL
  Filled 2011-07-28 (×5): qty 1

## 2011-07-28 MED ORDER — IPRATROPIUM BROMIDE 0.02 % IN SOLN
0.5000 mg | Freq: Four times a day (QID) | RESPIRATORY_TRACT | Status: DC
  Administered 2011-07-28 – 2011-07-29 (×3): 0.5 mg via RESPIRATORY_TRACT
  Filled 2011-07-28 (×3): qty 2.5

## 2011-07-28 MED ORDER — FLUTICASONE PROPIONATE HFA 220 MCG/ACT IN AERO
4.0000 | INHALATION_SPRAY | Freq: Two times a day (BID) | RESPIRATORY_TRACT | Status: DC
  Administered 2011-07-28 – 2011-07-31 (×5): 4 via RESPIRATORY_TRACT
  Filled 2011-07-28: qty 12

## 2011-07-28 MED ORDER — SODIUM CHLORIDE 0.9 % IJ SOLN
3.0000 mL | Freq: Two times a day (BID) | INTRAMUSCULAR | Status: DC
  Administered 2011-07-28 – 2011-07-30 (×4): 3 mL via INTRAVENOUS

## 2011-07-28 MED ORDER — LEVALBUTEROL HCL 1.25 MG/0.5ML IN NEBU
1.2500 mg | INHALATION_SOLUTION | Freq: Four times a day (QID) | RESPIRATORY_TRACT | Status: DC
  Administered 2011-07-28 – 2011-07-30 (×6): 1.25 mg via RESPIRATORY_TRACT
  Filled 2011-07-28 (×13): qty 0.5

## 2011-07-28 MED ORDER — HEPARIN (PORCINE) IN NACL 2-0.9 UNIT/ML-% IJ SOLN
INTRAMUSCULAR | Status: AC
  Filled 2011-07-28: qty 2000

## 2011-07-28 MED ORDER — LEVALBUTEROL HCL 1.25 MG/0.5ML IN NEBU
1.2500 mg | INHALATION_SOLUTION | Freq: Four times a day (QID) | RESPIRATORY_TRACT | Status: DC
  Filled 2011-07-28 (×3): qty 0.5

## 2011-07-28 MED ORDER — MORPHINE SULFATE 2 MG/ML IJ SOLN: 1.0000 mg | INTRAMUSCULAR | Status: DC | PRN

## 2011-07-28 MED ORDER — THERA M PLUS PO TABS: 0.5000 | ORAL_TABLET | Freq: Two times a day (BID) | ORAL | Status: DC

## 2011-07-28 MED ORDER — WHITE PETROLATUM GEL
Status: AC
  Administered 2011-07-28: 19:00:00
  Filled 2011-07-28: qty 5

## 2011-07-28 MED ORDER — IPRATROPIUM-ALBUTEROL 18-103 MCG/ACT IN AERO
2.0000 | INHALATION_SPRAY | Freq: Three times a day (TID) | RESPIRATORY_TRACT | Status: DC | PRN
  Filled 2011-07-28: qty 14.7

## 2011-07-28 MED ORDER — WARFARIN SODIUM 7.5 MG PO TABS
7.5000 mg | ORAL_TABLET | Freq: Once | ORAL | Status: DC
  Administered 2011-07-28: 7.5 mg via ORAL
  Filled 2011-07-28 (×2): qty 1

## 2011-07-28 MED ORDER — BIVALIRUDIN 250 MG IV SOLR
INTRAVENOUS | Status: AC
  Filled 2011-07-28: qty 250

## 2011-07-28 MED ORDER — IPRATROPIUM BROMIDE 0.02 % IN SOLN: 0.5000 mg | Freq: Four times a day (QID) | RESPIRATORY_TRACT | Status: DC

## 2011-07-28 MED ORDER — NITROGLYCERIN 0.4 MG SL SUBL: 0.4000 mg | SUBLINGUAL_TABLET | SUBLINGUAL | Status: DC | PRN

## 2011-07-28 MED ORDER — TICAGRELOR 90 MG PO TABS
ORAL_TABLET | ORAL | Status: AC
  Administered 2011-07-29: 90 mg via ORAL
  Filled 2011-07-28: qty 1

## 2011-07-28 MED ORDER — ONDANSETRON HCL 4 MG/2ML IJ SOLN: 4.0000 mg | Freq: Four times a day (QID) | INTRAMUSCULAR | Status: DC | PRN

## 2011-07-28 MED ORDER — ACETAMINOPHEN 325 MG PO TABS: 650.0000 mg | ORAL_TABLET | ORAL | Status: DC | PRN

## 2011-07-28 MED ORDER — FENTANYL CITRATE 0.05 MG/ML IJ SOLN
INTRAMUSCULAR | Status: AC
  Filled 2011-07-28: qty 2

## 2011-07-28 MED ORDER — ONDANSETRON HCL 4 MG/2ML IJ SOLN
INTRAMUSCULAR | Status: AC
  Filled 2011-07-28: qty 2

## 2011-07-28 MED ORDER — THERA M PLUS PO TABS
1.0000 | ORAL_TABLET | Freq: Every day | ORAL | Status: DC
  Administered 2011-07-29 – 2011-07-31 (×3): 1 via ORAL
  Filled 2011-07-28 (×3): qty 1

## 2011-07-28 MED ORDER — LIDOCAINE HCL (PF) 1 % IJ SOLN
INTRAMUSCULAR | Status: AC
  Filled 2011-07-28: qty 30

## 2011-07-28 NOTE — H&P (Signed)
[name] is an 76 y.o. male.   Chief Complaint: chest pain  HPI: 76 year old white male developed chest pain this AM at 1100. He took NTG SL and 2 baby asprin  Without relief.  EMS called he had acute ST elevation in ant. Lat leads.  Brought emergently to the cath lab.  Continues 7/10 chest pain + nausea and vomiting. EMS gave total of 6 mg. Morphine.  And another baby asprin.   31 year old Caucasian male with history of ST elevation myocardial infarction acute anterior wall with stent placed to the LAD on 06/21/2011. Following this he developed a right groin hematoma. He was also found to have a LV thrombus. This was discharged on Prolixin Coumadin at that time. The patient presented back to the emergency room with increase in right groin hematoma development of hip pain and pain in the right groin. Patient was taken for CT scan of the abdomen and pelvis. This showed large right groin hematoma extending into the upper right thigh measuring 11.3 x 9.0 cm in the greatest axial dimensions and extending 13 cm in length.this is an interval increase in size when compared to the previous study on November fourth 2012.patient seen by Dr. Scot Dock from vascular surgery. Patient was subsequently given intravenous vitamin K and taken to surgery on November 23 for evacuation of right thigh hematoma and repair of right superficial femoral artery.the patient continued to improve. Nebulized bronchodilators were initiated according to patient's home regimen. Coumadin was reinitiated.  INR on Friday the 30th of NOV. Was 1.9  He has continued his Brilinta.  Past Medical History  Diagnosis Date  . Myocardial infarction   . Asthma   . COPD (chronic obstructive pulmonary disease)   . Emphysema   . CAD 04/23/2009  . Anticoagulated on warfarin 07/17/2011  . CHF (congestive heart failure)   . Ventricular thrombus following MI (myocardial infarction) 11/12    on coumadin    Past Surgical History  Procedure Date    . Coronary stent placement 06/21/11  . Cardiac catheterization   . Groin debridement 07/18/2011    Procedure: GROIN DEBRIDEMENT;  Surgeon: Angelia Mould, MD;  Location: [facility] OR;  Service: Vascular;  Laterality: Right;  exploration of right groin,evacuation of right groin hematoma & repair of right superficial femoral artery    Family History  Problem Relation Age of Onset  . Diabetes type II Mother   . Coronary artery disease Mother   . Coronary artery disease Father   . Diabetes type II Brother   . Coronary artery disease Brother   . Craniosynostosis Neg Hx    Social History:  reports that he quit smoking about 3 months ago. He has never used smokeless tobacco. He reports that he does not drink alcohol or use illicit drugs.  Allergies:  Allergies  Allergen Reactions  . Ambien     Pt. Became confused and aggitated  . Demerol     Medications Prior to Admission  Medication Dose Route Frequency Provider Last Rate Last Dose  . heparin 2-0.9 UNIT/ML-% infusion           . lidocaine (XYLOCAINE) 1 % injection           . ondansetron (ZOFRAN) 4 MG/2ML injection            Medications Prior to Admission  Medication Sig Dispense Refill  . albuterol (PROVENTIL) (5 MG/ML) 0.5% nebulizer solution Take 0.5 mLs (2.5 mg total) by nebulization every 3 (three) hours  as needed for wheezing or shortness of breath.  20 mL  5  . albuterol-ipratropium (COMBIVENT) 18-103 MCG/ACT inhaler Inhale 2 puffs into the lungs 3 (three) times daily as needed. For coughing       . carisoprodol (SOMA) 350 MG tablet Take 350 mg by mouth 3 (three) times daily as needed. For back pain       . fluticasone (FLOVENT HFA) 220 MCG/ACT inhaler Inhale 4 puffs into the lungs 2 (two) times daily.  1 Inhaler  5  . ipratropium (ATROVENT) 0.02 % nebulizer solution Take 2.5 mLs (0.5 mg total) by nebulization every 6 (six) hours.  75 mL  10  . ipratropium (ATROVENT) 0.02 % nebulizer solution Take 2.5 mLs (0.5 mg total) by  nebulization every 3 (three) hours as needed.  75 mL  5  . levalbuterol (XOPENEX) 1.25 MG/0.5ML nebulizer solution Take 1.25 mg by nebulization every 6 (six) hours.  1 each  10  . metoprolol tartrate (LOPRESSOR) 12.5 mg TABS Take 0.5 tablets (12.5 mg total) by mouth 2 (two) times daily.  30 tablet  3  . montelukast (SINGULAIR) 10 MG tablet Take 10 mg by mouth daily.        . Multiple Vitamins-Minerals (MULTIVITAMINS THER. W/MINERALS) TABS Take 0.5 tablets by mouth 2 (two) times daily.        . nitroGLYCERIN (NITROSTAT) 0.4 MG SL tablet Place 0.4 mg under the tongue every 5 (five) minutes as needed. For chest pain       . omeprazole (PRILOSEC) 20 MG capsule Take 20 mg by mouth daily.        Marland Kitchen OVER THE COUNTER MEDICATION Place 1 drop into both eyes 2 (two) times daily. Saline Eye drops       . simvastatin (ZOCOR) 80 MG tablet Take 80 mg by mouth daily.        . Ticagrelor (BRILINTA) 90 MG TABS tablet Take 1 tablet (90 mg total) by mouth 2 (two) times daily.  60 tablet  10  . valsartan (DIOVAN) 40 MG tablet Take 1 tablet (40 mg total) by mouth 2 (two) times daily.  60 tablet  3  . warfarin (COUMADIN) 7.5 MG tablet Take 1 tablet (7.5 mg total) by mouth one time only at 6 PM.  30 tablet  5    No results found for this or any previous visit (from the past 48 hour(s)). No results found.  ROS: Brief due to pts. Distress.  Stated he had been doing well until this am.  There were no vitals taken for this visit. PE: General: Obviously in distress with Chest pain, nausea and vomiting.  Skin: color pale, cool and dry. Heent: normocephalic, sclera clear Neck: supple, no JVD, no bruits Heart: muffled heart sounds, S1S2 RRR though bradycardic. Lungs: Clear without rales rhonchi or wheezes. Abd: soft non tender, hypoactive bowel sounds. Ext: no edema, 1+ pedal pulses Neuro: A&O X 3, MAE, follows commands. Mental: Depressed, told family member that he didn't know if he wanted to go through much more.     Assessment/Plan STEMI Recent repair of Rt. Superficial femeral artery. LV thrombus COPD Anticoagulation Recent STEMI 06/21/11.  PLAN: Emegent cath.  INGOLD,LAURA R 07/28/2011, 12:24 PM   I have seen and examined the patient along with Cecilie Kicks, NP.  I have reviewed the chart, notes and new data.  I agree with Laura's note.  Key new complaints: New onset severe chest pain; similar to recent STEMI Key examination changes: pale, significant distress  Key new findings / data: Anterior STEMI  PLAN: Agree with emergent cath -- case to be started by Dr. Lia Foyer / Dr. Debara Pickett.  Will try radial vs. L groin access.  Emergent consent.  Leonie Man, M.D., M.S. [facility] [address] [place], [place]  [address]  [phone]  07/28/2011 2:06 PM

## 2011-07-28 NOTE — Progress Notes (Signed)
Paged for a Code STEMI. Chaplain greeted family and offered emotional support. Pt.'s wife Bethena Roys indicated that the patient is afraid of leaving his family with a large hospital bill. She also indicated that the patient is feeling very sad about his continuing ill health. He recently had a multiple week stay in 2900. Chaplain escorted family to a waiting room. Chaplain plans to follow up with patient and family once patient is admitted.

## 2011-07-28 NOTE — Brief Op Note (Signed)
07/28/2011  2:08 PM  PATIENT:  [name]  76 y.o. male with long history of known CAD, COPD s/p recent Ant STEMI 06/21/11 treated with Resolute DES to early mid LAD @ D2 bifurcation.  Course was complicated by Dx of LV apical thrombus --> started on heparin / warfarin.  Developed R groin hematoma that on Thanksgiving day became acutely worse --> to OR for exploration & ligation of RSFA branch vessel.  D/c on Brilinta & Warfarin.  INR ~2.01. In USOH until this AM with acute onset of chest pain/pressure.  EMS --> Ant STEMI --> emergent cath.  Both wrists - poor ulnar circulation.  L groin best option with angioseal closure.  PRE-OPERATIVE DIAGNOSIS:  Anterior STEMI  POST-OPERATIVE DIAGNOSIS: Acute Instent Restenosis with Thrombosis of recently placed Integrity Resolute DES with ~90% ostial D2 lesion in jailed D2. -- now with TIMI 3 flow in both vesses after PTCA of D2 & re-stenting of p-mLAD with Promus DES 2.75 mm x 32 mm postdilated to 3.16 mm.  PROCEDURE:  Procedure(s):  CORONARY ANGIOGRAM  PERCUTANEOUS CORONARY STENT INTERVENTION (PCI-S) -- PCI of p-mLAD ISR/thrombosis, PTCA of jailed Ostial D2.  SURGEON:  Surgeon(s): Leonie Man  ASSISTANTS: Dr. Bing Quarry - obtained access using smart needle in LCFA.  Guide angiography.  Dr. Chase Picket.  ANESTHESIA:   IV sedation - 75 mcg Fentanyl, 4mg  IV Zofran  EBL:   ~21ml  BLOOD ADMINISTERED:none, ordered, not given  DRAINS: none  STENT - Promus DES 2.75 mm x 32 mm -- final diameter 3.16 mm  LOCAL MEDICATIONS USED:  LIDOCAINE 20 ml  SPECIMEN:  No Specimen -- blood for labs  ANGIOSEAL CLOSURE DEVICE - 6 Fr.   DICTATION: .Note written in EPIC  PLAN OF CARE: Admit to inpatient   PATIENT DISPOSITION:  ICU - stable   Delay start of Pharmacological VTE agent (>24hrs) due to surgical blood loss or risk of bleeding:  Not applicable, continue warfarin.    Leonie Man, M.D., M.S. [facility] [address] [place], [place]  [address]  [phone]  07/28/2011 2:18 PM

## 2011-07-28 NOTE — Op Note (Addendum)
[place]     CARDIAC CATHETERIZATION REPORT  NAME:  [name]    MRN: [id number] DATE OF BIRTH:  10-12-1934   ADMIT DATE:  07/28/2011  Procedure date: 07/28/2011  Performing Cardiologist: Leonie Man, M.D., M.S.  Assistants:  Bing Quarry, M.D.; Chase Picket, M.D. Primary Physician: No primary provider on file. Primary Cardiologist:  Shelva Majestic, M.D.; [facility]  Procedures Performed:  Native Coronary Angiography only  Intracoronary Nitroglycerin Injection - 200 mcg  Complex Bifurcation Percutaneous Coronary Artery Intervention on proximal to mid LAD acute stent thrombosis within stent restenosis --  with a Promus DES   2.75 mm x  32 mm; final diameter  3.16 mm   Percutaneous Coronary Artery Intervention on a jailed ostial Diagonal 2 lesion with Balloon Angioplasty using A 2.17mm x 15 mm balloon.  Indication(s): Acute anterior ST elevation MI  Recent anterior ST elevation MI (06/21/11) - treated with Integrity Resolute DES 2.75 mm x 26 mm   Complicated post MI course: Left ventricular apical thrombus noted on Definity echo after anterior STEMI --> started on heparin to warfarin --> developed right thigh hematoma.  History: 76 y.o. male  with a history of ST elevation MI June 21, 2011 with an Integrity Resolute in the proximal portion of the mid LAD. Noted that LV thrombus and was started on IV heparin to warfarin and developed a right thigh hematoma. He was followed for almost a month with this hematoma, remaining on Brilinta and warfarin without aspirin. Unfortunately, he had recurrence of bleeding in the right eye and was admitted on November 22 of Ulnar CT scan demonstrating 11.3 x 9 by 13 cm hematoma. He was taken by Dr. Soledad Gerlach to the operating room for exploration and debridement. Branch of the right SFA was found to be the culprit and was ligated. The hematoma was evacuated and the patient is  restarted on heparin to Coumadin. He has been in his usual state of health until roughly 10:50 AM this morning when he developed severe test and chest pain not relieved by aspirin and nitroglycerin. He called EMS, and upon arrival (1115 hrs) ECG (1127 hrs) demonstrated Anterior ST elevations.  Good STEMI was called and the patient was transported directly to cardiac catheterization lab. He was recently evaluated Drs. Hilty and Stuckey upon arrival. He was still in severe chest pain with persistent ST elevations in anterior leads.  An INR was noted to be just over 2. Both wrists were checked with Allen's tests which showed insufficient Ulnar Arterial collateral flow, therefore the decision made to proceed with left femoral artery access.   Dr. Lia Foyer graciously agreed to initiate the procedure by obtaining access and initial angiographic images.  Non-system delay - due to concern over therapeutic INR in patient with known LV thrombus along with recent severe Right thigh hematoma requiring surgical exploration and ligation of bleeding vessel.  Both wrists were evaluated, and eventually a Smart Needle was used for Left Common Femoral Artery access.  Consent:  Risks of procedure as well as the alternatives and risks of each were explained to the (patient/caregiver).  Consent for procedure obtained.  Emergent verbal consent obtained as the patient is well-oriented procedure.   Procedure: The patient was brought to the [facility] via EMS (1200-1201 hrs).  He was prepped and draped in the usual sterile fashion for Left groin or radial  Access. A modified Allen's test with plethysmography was  performed on the  Both wrist demonstrating inadequate Ulnar Artery collateral flow.     Sterile technique was used including antiseptics, cap, gloves, gown, hand hygiene, mask and sheet.  Skin prep: Chlorhexidine;  Time Out: Verified patient identification, verified procedure,  site/side was marked, verified correct patient position, special equipment/implants available, medications/allergies/relevent history reviewed, required imaging and test results available.  Performed  The left femoral head was identified using tactile and fluoroscopic technique.  The left groin was anesthetized with 1% subcutaneous Lidocaine.  With the assistance of a Smart needle, the left Common Femoral Artery was accessed using the Modified Seldinger Technique with placement of a antimicrobial bonded/coated single lumen (6 Fr) sheath. (1223 hrs) The sheath was aspirated and flushed.    A 6 Fr JL 35 Guide Catheter was advanced of over a Standard J wire into the ascending Aorta.  The catheter was used to engage the left coronary artery.  Multiple cineangiographic views of the  left coronary artery system were performed, demonstrating acute in-stent thrombosis of the recently placed LAD stent with 100% occlusion and TIMI 0 flow beyond the level of the first diagonal branch.  PERCUTANEOUS CORONARY INTERVENTION PROCEDURE  After reviewing the initial cineangiography images, the culprit lesion -- proximal to mid LAD occlusion -- was identified, and the decision was made to proceed with percutaneous coronary intervention.  A weight based bolus of IV Angiomax was administered and the drip was continued until completion of the procedure.   Oral Ticagrelor 90 mg was administered.  The 6 [place] JL 3.5 Guide catheter(s) were advanced over a J-wire and used to engage the left Coronary Artery. An ACT of > 200 Sec was confirmed prior to advancing the Guidewire. Non-system delay (difficulty passing guidewire): The guidewire was then advanced into the proximal LAD however there was extreme difficulty in passing the stenotic segment. It is difficult to ascertain whether the wire was passing under the stent strut. 8 check 2.25 mm x 15 mm rapid exchange balloon was advanced over the wire to the point of the wire would  not advance further. With the support of the balloon the wire was then easily passed beyond the thrombosed segment into the distal LAD. The balloon was advanced through the stent to ensure free passage.  Angiography at this time revealed TIMI 1 flow distally. The balloon was then brought brought back into the mid stent and deployed followed by deployment of proximal portion of the stent.  Post-balloon angiography revealed TIMI 3 flow distally with a 80% in-stent restenosis in the proximal portion of the stent as well as A. 80-90% ostial D2 lesion that was jailed by the recently placed stent.   The decision was made to proceed with PTCA of the ostial D2 lesion prior to treating the LAD lesion with A new stent thus creating 2 overlapping stents jailing the vessel.   Lesion #1:  Proximal to mid LAD, just after moderate caliber D1  Pre-PCI Stenosis: 100 % Post-PCI Stenosis: 0 %     TIMI 0 flow       TIMI 3 flow  Guide Catheter: JL 3.5    Guidewire: [place]ater  Pre-Dilitation Balloon: 2.25 mm x 15 mm Trek compliable balloon   1st Inflation:  8 Atm for 27 Sec -- distal stent (1249 hrs)   2nd Inflation: 8 Atm for 35 Sec  Scout angiography  Revealed restoration of TIMI-3 flow throughout the LAD and D2 branch. No dissection or perforation. Also at this time the patient became pain-free,  confirming reperfusion. At this time, attention was turned to the ostial D2 lesion (Lesion #2)  Stent: Promus Element DES  2.75 mm x  32 mm (overlapping proximal and distal to the recently placed Integrity Resolute Stent)   Deployment:  12 Atm for  47 Sec   2nd Inflation:  21 Atm for  73 Sec -- post-dilation with stent balloon; final diameter: 2.96 mm  Scout angiography did not reveal evidence of dissection or perforation.  TIMI-3 flow preserved.  The ostial D2 lesion now appeared to be 40-50%.   Post-Dilitation Balloon:  3.0 mm x 20 mm [place] Quantum Apex Monorail   1st Inflation:   18 Atm for  50 Sec -- distal portion of stent;  final diameter: 3.16 mm   2nd Inflation:  22 Atm for  45 Sec -- proximal portion of stent ; final diameter: 3.2 mm Scout angiography did not reveal evidence of dissection or perforation  Lesion # 2:  Ostial D2, jailed  Pre-PCI Stenosis: 80-90 % Post-PCI Stenosis: 40-50 %     TIMI 3 flow       TIMI 3 flow  Guide Catheter: 6 French JL 3.5   Guidewire: Luge; advanced through the integrity stent into the D2 medial branch  Pre-Dilitation Balloon: 2.25 mm 15 mm Trek compliable balloon   1st Inflation:   8 Atm for 37 Sec  Pre-Dilitation Balloon: 2.75 mm 15 mm Trek compliable balloon   1st Inflation: 12 Atm for 41 Sec Scout angiography did not reveal evidence of dissection or perforation.   Post-dilation angiography with and without wire did not reveal evidence of dissection or perforation, with TIMI-3 flow reserved in both the LAD and second diagonal branch.  After completion of the percutaneous intervention, A 6 Fr JR 4 Catheter was advanced of over a standard J) wire into the ascending Aorta.  The catheter was used to engage the right coronary artery.  Multiple cineangiographic views of the right coronary artery system were performed.  The aortic valve was not crossed to obtain left ventricle pressures or left ventriculography due to the known left ventricular apical thrombus. The catheter was removed removed from the body over over a J-wire. With the wire in place a left common femoral angiogram was performed in the LAO projection to evaluate for possible Angio-Seal closure. The anatomy was felt to be appropriate. The left groin was then reprepped in a sterile fashion, new sterile gloves were donned. The left common femoral arteriotomy was then closed with a 6 French Angio-Seal closure device, and excellent hemostasis.  Hemodynamics:  Central Aortic Pressure: 108/104mmHg  Coronary Angiographic Data: Dominance: Right Coronary Artery Left Main:  Short large caliber vessel, bifurcates into LAD and  circumflex. Angiographically normal  Left Anterior Descending (LAD):  Large-caliber vessel, initial images demonstrate 100% total occlusion just after D1 in the proximal portion of the recently placed stent.  1st diagonal (D1):  Moderate caliber no angiographic evidence of disease  After restoration of flow: The LAD then administered 80-90% proximal in-stent stenosis at the bifurcation of the way D2.  The LAD then extends down to the apex, there is diffuse 30-40% lesions distally to  2nd diagonal (D2):  Moderate to large caliber vessel. Ostial 80-90% lesion at jailed segment. This vessel bifurcates and the remainder the vessel is free of angiographic disease.  Circumflex (LCx):  Large-caliber vessel, gives rise to small AV groove branch before bifurcating into 2 obtuse marginal branches. Just prior to bifurcation there is a tubular 30% lesion  and extends into the inferior branch.  The remainder the vessel is angiographically free of any significant disease.  Ramus Intermedius:  Very small-caliber, tortuous, angiographically normal  Right Coronary Artery: Large large-caliber vessel, proximal diffuse 20% luminal irregularities, takes a sharp 90 turn out to the proximal segment. Prior to small bifurcating RV marginal branch  posterior descending artery: Moderate caliber, proximal tubular 40-50% lesion, otherwise angiographically normal  posterior lateral system:  Moderate caliber right posterior atrioventricular groove artery that gives rise to 3 posterolateral branches in the AV nodal branch. The vessel tapered to small vessels are angiographically normal.  The patient  was unstable before the procedure with mild hypotension and diaphoresis; however upon restoration of TIMI-3 flow in the LAD he became pain-free and remained stable throughout the remainder of the.  The patient was transported to the PACU then CCU in stable, pain free condition.   Patient did tolerate procedure well. There were  not complications.  EBL: <20  Medications:  Premedication: 4 mg Zofran  Sedation:  75 mcg IV Fentanyl  Contrast:  205 mL Omnipaque  Angiomax bolus (0.75 mg/kg) and drip (1.75 mg/kg/Hr)  administered throughout the procedure). ACT of greater than 200 sec over confirmed.  Brilinta 90 mg by mouth  Impression: 1.  Acute anterior ST elevation MI secondary to severe in-stent restenosis with 100% In-stent thrombosis of recently placed drug eluding stent.   2.  Successful Percutaneous Coronary Intervention of this bifurcation lesion with PTCA of the ostial D2 lesion prior to overlap stenting of the LAD stent with Promus element DES 2.75 mm x 32 mm postdilated to 3.16 mm. 3.  No new additional coronary disease when compared to recent catheterization  Pain Onset: 1050 hrs First Medical Contact (EMS): 1115 hrs First ECG: 1127 hrs -- Code STEMI called Arrival to [facility]: 1200 hrs Arrival to Cath Lab: 1201 hrs Access: 1223 hrs 1st Balloon: 1249 hrs.  -- Door to Balloon: 49 min.  Plan: 1.  Proceed with post catheterization care with standard bed despite Angio-Seal closure rest as the patient has a therapeutic INR. 2.  Continue Brilinta and warfarin for LV thrombus 3.  Monitor for bleeding and continue all medications including nebulizer therapies. 4.  Would not anticipate fast track discharge due to his known decreased ejection fraction and recent complications as well as severe COPD.  The case And results was discussed with the patient (and family). The case and results was not discussed with the patient's PCP. The case and results was discussed with the patient's Cardiologist.  Time Spend Directly with Patient:  1hr 45 min  Olanda Downie W, M.D., M.S. [facility] [address] [place], [place]  [id number]  [phone]

## 2011-07-29 DIAGNOSIS — I213 ST elevation (STEMI) myocardial infarction of unspecified site: Secondary | ICD-10-CM | POA: Diagnosis not present

## 2011-07-29 LAB — CARDIAC PANEL(CRET KIN+CKTOT+MB+TROPI)
CK, MB: 23.3 ng/mL (ref 0.3–4.0)
Relative Index: 7.4 — ABNORMAL HIGH (ref 0.0–2.5)
Total CK: 382 U/L — ABNORMAL HIGH (ref 7–232)
Total CK: 313 U/L — ABNORMAL HIGH (ref 7–232)

## 2011-07-29 LAB — CBC
HCT: 28.7 % — ABNORMAL LOW (ref 39.0–52.0)
Hemoglobin: 9.2 g/dL — ABNORMAL LOW (ref 13.0–17.0)
RBC: 2.97 MIL/uL — ABNORMAL LOW (ref 4.22–5.81)
RDW: 16.2 % — ABNORMAL HIGH (ref 11.5–15.5)
WBC: 8.7 10*3/uL (ref 4.0–10.5)

## 2011-07-29 LAB — BASIC METABOLIC PANEL
BUN: 18 mg/dL (ref 6–23)
CO2: 29 mEq/L (ref 19–32)
Chloride: 105 mEq/L (ref 96–112)
GFR calc Af Amer: 59 mL/min — ABNORMAL LOW (ref >90)
Glucose, Bld: 106 mg/dL — ABNORMAL HIGH (ref 70–99)
Potassium: 4.2 mEq/L (ref 3.5–5.1)

## 2011-07-29 LAB — PREPARE FRESH FROZEN PLASMA: Unit division: 0

## 2011-07-29 MED ORDER — DOCUSATE SODIUM 100 MG PO CAPS
100.0000 mg | ORAL_CAPSULE | Freq: Two times a day (BID) | ORAL | Status: DC
  Administered 2011-07-29 – 2011-07-31 (×5): 100 mg via ORAL
  Filled 2011-07-29 (×8): qty 1

## 2011-07-29 MED ORDER — CARISOPRODOL 350 MG PO TABS
350.0000 mg | ORAL_TABLET | Freq: Three times a day (TID) | ORAL | Status: DC
Start: 2011-07-29 — End: 2011-07-31
  Administered 2011-07-29 – 2011-07-31 (×7): 350 mg via ORAL
  Filled 2011-07-29 (×8): qty 1

## 2011-07-29 MED ORDER — ASPIRIN 81 MG PO CHEW
81.0000 mg | CHEWABLE_TABLET | Freq: Every day | ORAL | Status: DC
  Administered 2011-07-29 – 2011-07-31 (×3): 81 mg via ORAL
  Filled 2011-07-29 (×3): qty 1

## 2011-07-29 MED FILL — Dextrose Inj 5%: INTRAVENOUS | Qty: 50 | Status: AC

## 2011-07-29 NOTE — Progress Notes (Signed)
Patient ID: [name], male   DOB: 01-Feb-1935, 76 y.o.   MRN: [id number]   [facility]  Subjective: No CP or SOB.  No pain at cath site.   Objective: Vital signs in last 24 hours: Temp:  [97.6 F (36.4 C)-98.3 F (36.8 C)] 98.1 F (36.7 C) (12/04 0400) Pulse Rate:  [52-95] 80  (12/04 0400) Resp:  [18-26] 19  (12/04 0400) BP: (100-148)/(58-80) 112/66 mmHg (12/04 0400) SpO2:  [90 %-100 %] 94 % (12/04 0400) Weight:  [89.9 kg (198 lb 3.1 oz)-90.3 kg (199 lb 1.2 oz)] 198 lb 3.1 oz (89.9 kg) (12/04 0400) Last BM Date: 07/27/11  Intake/Output from previous day: 12/03 0701 - 12/04 0700 In: 1400 [P.O.:700; I.V.:700] Out: 3050 [Urine:3050] Intake/Output this shift: Total I/O In: 300 [P.O.:100; I.V.:200] Out: 2400 [Urine:2400]  Medications Current Facility-Administered Medications  Medication Dose Route Frequency Provider Last Rate Last Dose  . acetaminophen (TYLENOL) tablet 650 mg  650 mg Oral Q4H PRN Leonie Man      . albuterol (PROVENTIL) (5 MG/ML) 0.5% nebulizer solution 2.5 mg  2.5 mg Nebulization Q3H PRN Leonie Man      . albuterol-ipratropium (COMBIVENT) inhaler 2 puff  2 puff Inhalation TID PRN Leonie Man      . bivalirudin (ANGIOMAX) 250 MG injection           . fentaNYL (SUBLIMAZE) 0.05 MG/ML injection           . fluticasone (FLOVENT HFA) 220 MCG/ACT inhaler 4 puff  4 puff Inhalation BID Leonie Man   4 puff at 07/28/11 1944  . heparin 2-0.9 UNIT/ML-% infusion           . ipratropium (ATROVENT) nebulizer solution 0.5 mg  0.5 mg Nebulization Q3H PRN Leonie Man      . ipratropium (ATROVENT) nebulizer solution 0.5 mg  0.5 mg Nebulization Q6H Leida Lauth, RRT   0.5 mg at 07/28/11 1939  . levalbuterol (XOPENEX) nebulizer solution 1.25 mg  1.25 mg Nebulization Q6H Leida Lauth, RRT   1.25 mg at 07/28/11 1939  . lidocaine (XYLOCAINE) 1 % injection           . metoprolol tartrate (LOPRESSOR)  tablet 12.5 mg  12.5 mg Oral BID Leonie Green Harding   12.5 mg at 07/28/11 2200  . montelukast (SINGULAIR) tablet 10 mg  10 mg Oral Daily Leonie Man      . morphine 2 MG/ML injection 1 mg  1 mg Intravenous Q1H PRN Leonie Man      . multivitamins ther. w/minerals tablet 1 tablet  1 tablet Oral Daily Leonie Man      . nitroGLYCERIN (NITROSTAT) SL tablet 0.4 mg  0.4 mg Sublingual Q5 min PRN Leonie Man      . olmesartan (BENICAR) tablet 5 mg  5 mg Oral Daily Leonie Man   5 mg at 07/28/11 1745  . ondansetron (ZOFRAN) 4 MG/2ML injection           . ondansetron (ZOFRAN) injection 4 mg  4 mg Intravenous Q6H PRN Leonie Man      . pantoprazole (PROTONIX) EC tablet 40 mg  40 mg Oral Q1200 Leonie Man   40 mg at 07/28/11 1745  . rosuvastatin (CRESTOR) tablet 20 mg  20 mg Oral QHS Leonie Green Harding   20 mg at 07/28/11 2200  . sodium chloride 0.9 % injection 3 mL  3  mL Intravenous Q12H Leonie Man   3 mL at 07/28/11 2208  . Ticagrelor (BRILINTA) 90 MG tablet           . Ticagrelor (BRILINTA) tablet 90 mg  90 mg Oral BID Leonie Man   90 mg at 07/28/11 2201  . warfarin (COUMADIN) tablet 7.5 mg  7.5 mg Oral ONCE-1800 Leonie Man   7.5 mg at 07/28/11 1745  . white petrolatum (VASELINE) gel           . DISCONTD: 0.9 %  sodium chloride infusion   Intravenous Continuous Leonie Man      . DISCONTD: ipratropium (ATROVENT) nebulizer solution 0.5 mg  0.5 mg Nebulization Q6H Leonie Man      . DISCONTD: levalbuterol (XOPENEX) nebulizer solution 1.25 mg  1.25 mg Nebulization Q6H Leonie Man      . DISCONTD: multivitamins ther. w/minerals tablet 1 tablet  1 tablet Oral BID Leonie Man        PE: General appearance: alert, cooperative and no distress Lungs: +Wheeze,  decreased BS Heart: regular rate and rhythm, S1, S2 normal, no murmur, click, rub or gallop Extremities: No LEE Pulses: 2+ right DP, 2+ left PT, 1+ left DP. 2+ radials Left groin: No hematoma or  ecchymosis.  Nontender Right groin:  Resolving hematoma.  No erythema.   Lab Results:   Basename 07/29/11 0226 07/28/11 1234 07/28/11 1205  WBC 8.7 -- 13.6*  HGB 9.2* 9.5* 8.9*  HCT 28.7* 28.0* 27.5*  PLT 321 -- 391   BMET  Basename 07/29/11 0226 07/28/11 1234 07/28/11 1205  NA 140 141 141  K 4.2 4.1 4.2  CL 105 104 105  CO2 29 -- 26  GLUCOSE 106* 156* 150*  BUN 18 21 20   CREATININE 1.33 1.40* 1.25  CALCIUM 8.1* -- 7.7*   PT/INR  Basename 07/29/11 0226 07/28/11 1205  LABPROT 26.2* 24.9*  INR 2.36* 2.21*   Cholesterol  Basename 07/28/11 1205  CHOL 126   Cardiac Enzymes   Studies/Results:  Left Heart Cath 07/28/11: Hemodynamics:  Central Aortic Pressure: 108/29mmHg  Coronary Angiographic Data:  Dominance: Right Coronary Artery  Left Main: Short large caliber vessel, bifurcates into LAD and circumflex. Angiographically normal  Left Anterior Descending (LAD): Large-caliber vessel, initial images demonstrate 100% total occlusion just after D1 in the proximal portion of the recently placed stent. 1st diagonal (D1): Moderate caliber no angiographic evidence of disease After restoration of flow: The LAD then administered 80-90% proximal in-stent stenosis at the bifurcation of the way D2. The LAD then extends down to the apex, there is diffuse 30-40% lesions distally to 2nd diagonal (D2): Moderate to large caliber vessel. Ostial 80-90% lesion at jailed segment. This vessel bifurcates and the remainder the vessel is free of angiographic disease. Circumflex (LCx): Large-caliber vessel, gives rise to small AV groove branch before bifurcating into 2 obtuse marginal branches. Just prior to bifurcation there is a tubular 30% lesion and extends into the inferior branch. The remainder the vessel is angiographically free of any significant disease. Ramus Intermedius: Very small-caliber, tortuous, angiographically normal Right Coronary Artery: Large large-caliber vessel, proximal  diffuse 20% luminal irregularities, takes a sharp 90 turn out to the proximal segment. Prior to small bifurcating RV marginal branch posterior descending artery: Moderate caliber, proximal tubular 40-50% lesion, otherwise angiographically normal  posterior lateral system: Moderate caliber right posterior atrioventricular groove artery that gives rise to 3 posterolateral branches in the AV nodal branch. The vessel tapered  to small vessels are angiographically normal. The patient was unstable before the procedure with mild hypotension and diaphoresis; however upon restoration of TIMI-3 flow in the LAD he became pain-free and remained stable throughout the remainder of the. The patient was transported to the PACU then CCU in stable, pain free condition.  Patient did tolerate procedure well.  There were not complications.  EBL: <20  Medications:  Premedication: 4 mg Zofran  Sedation: 75 mcg IV Fentanyl  Contrast: 205 mL Omnipaque  Angiomax bolus (0.75 mg/kg) and drip (1.75 mg/kg/Hr) administered throughout the procedure). ACT of greater than 200 sec over confirmed.  Brilinta 90 mg by mouth  Impression:  1. Acute anterior ST elevation MI secondary to severe in-stent restenosis with 100% In-stent thrombosis of recently placed drug eluding stent.  2. Successful Percutaneous Coronary Intervention of this bifurcation lesion with PTCA of the ostial D2 lesion prior to overlap stenting of the LAD stent with Promus element DES 2.75 mm x 32 mm postdilated to 3.16 mm.  3. No new additional coronary disease when compared to recent catheterization    Assessment/Plan  Active Problems:  Patient Active Hospital Problem List: STEMI (ST elevation myocardial infarction) (07/29/2011)  secondary to severe in-stent restenosis with 100% In-stent thrombosis of recently placed drug  eluding stent.  Successful Percutaneous Coronary Intervention of this bifurcation lesion with  PTCA of the ostial D2 lesion prior to  overlap stenting of the LAD stent with Promus element  DES 2.75 mm x 32 mm   Left Ventricular thrombus s/p Anterior STEMI (07/02/2011) DYSLIPIDEMIA (04/23/2009) HYPERTENSION (04/23/2009) CAD (04/23/2009) CARDIOMYOPATHY, ISCHEMIC (05/08/2009) Acute blood loss anemia (06/29/2011) CKD (chronic kidney disease) stage 3, GFR 30-59 ml/min (06/29/2011) COPD (chronic obstructive pulmonary disease) (06/29/2011) Right side Groin Hematoma following emergent percutaneous transluminal coronary angioplasty.   S/P evacuation of hematoma right thigh. Repair of right superficial femoral artery.  (07/17/2011) Anticoagulated on warfarin (07/17/2011)    Plan:  BP/HR stable.  Groin looks good. Brilinta 90mg . Coumadin. Lopressor, Benicar, crestor.  Can probably transfer to step-down.   LOS: 1 day    Felicidad Sugarman W 07/29/2011 5:31 AM

## 2011-07-29 NOTE — Progress Notes (Signed)
ANTICOAGULATION CONSULT NOTE - Follow Up Consult  Pharmacy Consult for Coumadin  Indication: LV thrombus; Goal INR 2-3  Allergies  Allergen Reactions  . Ambien     Pt. Became confused and aggitated  . Demerol Other (See Comments)    Abnormal behavior    Patient Measurements: Height: 5\' 7"  (170.2 cm) Weight: 198 lb 3.1 oz (89.9 kg) IBW/kg (Calculated) : 66.1    Vital Signs: Temp: 98.5 F (36.9 C) (12/04 0802) Temp src: Oral (12/04 0802) BP: 105/54 mmHg (12/04 0900) Pulse Rate: 90  (12/04 0900)  Labs:  Basename 07/29/11 0918 07/29/11 0227 07/29/11 0226 07/28/11 1810 07/28/11 1234 07/28/11 1205  HGB -- -- 9.2* -- 9.5* --  HCT -- -- 28.7* -- 28.0* 27.5*  PLT -- -- 321 -- -- 391  APTT -- -- -- -- -- 30  LABPROT -- -- 26.2* -- -- 24.9*  INR -- -- 2.36* -- -- 2.21*  HEPARINUNFRC -- -- -- -- -- --  CREATININE -- -- 1.33 -- 1.40* 1.25  CKTOTAL 313* 382* -- 349* -- --  CKMB PENDING 30.5* -- 34.7* -- --  TROPONINI PENDING 15.05* -- 15.63* -- --   Estimated Creatinine Clearance: 52.1 ml/min (by C-G formula based on Cr of 1.33).   Medications:  Scheduled:    . aspirin  81 mg Oral Daily  . bivalirudin      . docusate sodium  100 mg Oral BID  . fentaNYL      . fluticasone  4 puff Inhalation BID  . heparin      . ipratropium  0.5 mg Nebulization Q6H  . levalbuterol  1.25 mg Nebulization Q6H  . lidocaine      . metoprolol tartrate  12.5 mg Oral BID  . montelukast  10 mg Oral Daily  . multivitamins ther. w/minerals  1 tablet Oral Daily  . olmesartan  5 mg Oral Daily  . ondansetron      . pantoprazole  40 mg Oral Q1200  . rosuvastatin  20 mg Oral QHS  . sodium chloride  3 mL Intravenous Q12H  . Ticagrelor  90 mg Oral BID  . warfarin  7.5 mg Oral ONCE-1800  . white petrolatum      . DISCONTD: ipratropium  0.5 mg Nebulization Q6H  . DISCONTD: levalbuterol  1.25 mg Nebulization Q6H  . DISCONTD: multivitamins ther. w/minerals  1 tablet Oral BID    Assessment: 76 yo  male with extensive CAD history, on ticagrelor, ASA, and warfarin for hx stent thrombosis and LV thrombus.  Higher risk for bleeding due to recent groin hematoma.  INR 2.36 today.  No bleeding noted currently.   Goal of Therapy:  INR 2-3   Plan:  1. Will give Coumadin 7.5 mg tonight.  Consistent with home dose of Coumadin 7.5 mg daily except 3.75 mg on Sun, Wed. 2. Monitor for bleeding, 3. AM INR.  Beckham Capistran C 07/29/2011,10:49 AM

## 2011-07-29 NOTE — Progress Notes (Signed)
CARDIAC REHAB PHASE I   PRE:  Rate/Rhythm: 90 SR  BP:  Supine: 107/67  Sitting:   Standing:    SaO2: 96 RA  MODE:  Ambulation: 700 ft   POST:  Rate/Rhythem: 112 ST  BP:  Supine:   Sitting: 95/69  Standing:    SaO2: 92 RA 0930-1030 Tolerated ambulation  well using walker. Gait steady. Some DOE,but normal for him. RA sat after walk 92%. To recliner after walk with call light in reach. Reviewed MI education with pt. He voices understanding.Referred him to Outpt. CRP in [place] after MI, will update them of new event.he wants to attend. Deon Pilling

## 2011-07-29 NOTE — Progress Notes (Signed)
Pt. Seen and examined. Agree with the NP/PA-C note as written.  Mr. Chirdon is much improved today. EKG shows resolving (subacute) anterior MI with <1 mm elevation and TWI's anteriorly. He is chest pain free this morning. Groin site of closure looks intact without hematoma.  Will continue aggressive respiratory care. Coumadin per pharmacy for INR 2-3 for LV thrombus. Maintain on Brillinta and low dose aspirin 81 mg.  [place] for telemetry transfer today.  Pixie Casino, MD Attending Cardiologist [facility]

## 2011-07-29 NOTE — Progress Notes (Signed)
   CARE MANAGEMENT NOTE 07/29/2011  Patient:  [name], [name]   Account Number:  [id number]  Date Initiated:  07/29/2011  Documentation initiated by:  GRAVES-BIGELOW,Sopheap Boehle  Subjective/Objective Assessment:   Pt admitted with cp. Had emergent cath and successful PCI/stenting. Pt has great family support. Plan for home on brilinta.     Action/Plan:   CM provided pt with brilinta card on last visit.   Anticipated DC Date:  07/30/2011   Anticipated DC Plan:  [place]  CM consult  Medication Assistance      Choice offered to / List presented to:             Status of service:  Completed, signed off Medicare Important Message given?   (If response is "NO", the following Medicare IM given date fields will be blank) Date Medicare IM given:   Date Additional Medicare IM given:    Discharge Disposition:  HOME/SELF CARE  Per UR Regulation:    Comments:  07-29-11 0957 Jacqlyn Krauss, RN,BSN [phone]. No other needs assessed by CM at this time. He is being followed by cardiac rehab. Will continue to monitor.

## 2011-07-30 LAB — BASIC METABOLIC PANEL
CO2: 25 mEq/L (ref 19–32)
Calcium: 8.4 mg/dL (ref 8.4–10.5)
Creatinine, Ser: 1.45 mg/dL — ABNORMAL HIGH (ref 0.50–1.35)
Glucose, Bld: 101 mg/dL — ABNORMAL HIGH (ref 70–99)

## 2011-07-30 LAB — CBC
MCH: 31.2 pg (ref 26.0–34.0)
MCHC: 32 g/dL (ref 30.0–36.0)
MCV: 97.5 fL (ref 78.0–100.0)
Platelets: 335 10*3/uL (ref 150–400)
RBC: 3.14 MIL/uL — ABNORMAL LOW (ref 4.22–5.81)

## 2011-07-30 MED ORDER — LEVALBUTEROL HCL 1.25 MG/0.5ML IN NEBU
1.2500 mg | INHALATION_SOLUTION | Freq: Three times a day (TID) | RESPIRATORY_TRACT | Status: DC
  Administered 2011-07-30 – 2011-07-31 (×4): 1.25 mg via RESPIRATORY_TRACT
  Filled 2011-07-30 (×6): qty 0.5

## 2011-07-30 MED ORDER — WARFARIN SODIUM 2.5 MG PO TABS
3.7500 mg | ORAL_TABLET | ORAL | Status: DC
  Administered 2011-07-30: 3.75 mg via ORAL
  Filled 2011-07-30 (×2): qty 1

## 2011-07-30 MED ORDER — WARFARIN SODIUM 7.5 MG PO TABS
7.5000 mg | ORAL_TABLET | ORAL | Status: DC
  Filled 2011-07-30: qty 1

## 2011-07-30 MED ORDER — CEPHALEXIN 250 MG PO CAPS
250.0000 mg | ORAL_CAPSULE | Freq: Three times a day (TID) | ORAL | Status: DC
  Administered 2011-07-30 – 2011-07-31 (×4): 250 mg via ORAL
  Filled 2011-07-30 (×7): qty 1

## 2011-07-30 MED ORDER — METOPROLOL TARTRATE 12.5 MG HALF TABLET
12.5000 mg | ORAL_TABLET | Freq: Three times a day (TID) | ORAL | Status: DC
  Administered 2011-07-30 – 2011-07-31 (×2): 12.5 mg via ORAL
  Filled 2011-07-30 (×6): qty 1

## 2011-07-30 NOTE — Progress Notes (Signed)
Subjective:  No chest pain  Objective:  Vital Signs in the last 24 hours: Temp:  [98.1 F (36.7 C)-98.3 F (36.8 C)] 98.1 F (36.7 C) (12/05 0500) Pulse Rate:  [73-98] 89  (12/05 0745) Resp:  [16-22] 21  (12/05 0745) BP: (87-112)/(59-79) 111/79 mmHg (12/05 0500) SpO2:  [92 %-98 %] 98 % (12/05 0745) Weight:  [89.359 kg (197 lb)] 197 lb (89.359 kg) (12/04 1700)  Intake/Output from previous day: 12/04 0701 - 12/05 0700 In: 733 [P.O.:730; I.V.:3] Out: 1190 [Urine:1190]  Physical Exam: General appearance: alert, cooperative and no distress Lungs: clear to auscultation bilaterally Heart: regular rate and rhythm, S1, S2 normal, no murmur, click, rub or gallop Mild echymosis Lt groin site Mild erythema and induration L forearm volar aspect at prior IV site. No edema.  Rate: 88  Rhythm:NSR  Lab Results:  [facility] 07/30/11 0608 07/29/11 0226  WBC 7.4 8.7  HGB 9.8* 9.2*  PLT 335 321    Basename 07/30/11 0608 07/29/11 0226  NA 141 140  K 4.2 4.2  CL 106 105  CO2 25 29  GLUCOSE 101* 106*  BUN 15 18  CREATININE 1.45* 1.33    Basename 07/29/11 0918 07/29/11 0227  TROPONINI 7.44* 15.05*   Hepatic Function Panel  Basename 07/28/11 1205  PROT 5.7*  ALBUMIN 3.0*  AST 16  ALT 15  ALKPHOS 53  BILITOT 0.2*  BILIDIR --  IBILI --    Basename 07/28/11 1205  CHOL 126    Basename 07/30/11 0608  INR 2.35*    Imaging: No results found.  Cardiac Studies:  Assessment/Plan:   Principal Problem:  *STEMI 07/28/11 secondary to ISR of LAD DES placed 06/21/11. Rx'd with overlapping DES to LAD and PCI to jailed Dx2.  Active Problems:  CAD  CARDIOMYOPATHY, ISCHEMIC  COPD (chronic obstructive pulmonary disease)  Right side Groin Hematoma following emergent percutaneous transluminal coronary angioplasty.   S/P evacuation of hematoma right thigh. Repair of right superficial femoral artery.   DYSLIPIDEMIA  HYPERTENSION  Anemia  CKD (chronic kidney disease) stage 3, GFR  30-59 ml/min  Left Ventricular thrombus s/p Anterior STEMI  Anticoagulated on warfarin for LVT    Plan-? D/c later today   Kerin Ransom PA-C 07/30/2011, 10:57 AM       Patient seen and examined. Agree with assessment and plan. Will further titrate beta-blocker as BP allows for improved rate control.  Add keflex for erythema and induration L forearm.  Possible d/c tomorrow, if stable.   Troy Sine, MD, [facility] 07/30/2011 2:58 PM

## 2011-07-30 NOTE — Progress Notes (Addendum)
ANTICOAGULATION CONSULT NOTE - Follow Up Consult  Pharmacy Consult for Coumadin  Indication: LV thrombus; Goal INR 2-3  Allergies  Allergen Reactions  . Ambien     Pt. Became confused and aggitated  . Demerol Other (See Comments)    Abnormal behavior    Patient Measurements: Height: 5\' 7"  (170.2 cm) Weight: 197 lb (89.359 kg) IBW/kg (Calculated) : 66.1    Vital Signs: Temp: 98.1 F (36.7 C) (12/05 0500) Temp src: Oral (12/05 0500) BP: 111/79 mmHg (12/05 0500) Pulse Rate: 89  (12/05 0745)  Labs:  Basename 07/30/11 [id number] 07/29/11 0918 07/29/11 0227 07/29/11 0226 07/28/11 1810 07/28/11 1234 07/28/11 1205  HGB 9.8* -- -- 9.2* -- -- --  HCT 30.6* -- -- 28.7* -- 28.0* --  PLT 335 -- -- 321 -- -- 391  APTT -- -- -- -- -- -- 30  LABPROT 26.1* -- -- 26.2* -- -- 24.9*  INR 2.35* -- -- 2.36* -- -- 2.21*  HEPARINUNFRC -- -- -- -- -- -- --  CREATININE 1.45* -- -- 1.33 -- 1.40* --  CKTOTAL -- 313* 382* -- 349* -- --  CKMB -- 23.3* 30.5* -- 34.7* -- --  TROPONINI -- 7.44* 15.05* -- 15.63* -- --   Estimated Creatinine Clearance: 47.7 ml/min (by C-G formula based on Cr of 1.45).   Medications:  Scheduled:     . aspirin  81 mg Oral Daily  . carisoprodol  350 mg Oral TID  . docusate sodium  100 mg Oral BID  . fluticasone  4 puff Inhalation BID  . levalbuterol  1.25 mg Nebulization TID  . metoprolol tartrate  12.5 mg Oral BID  . montelukast  10 mg Oral Daily  . multivitamins ther. w/minerals  1 tablet Oral Daily  . olmesartan  5 mg Oral Daily  . pantoprazole  40 mg Oral Q1200  . rosuvastatin  20 mg Oral QHS  . sodium chloride  3 mL Intravenous Q12H  . Ticagrelor  90 mg Oral BID  . DISCONTD: ipratropium  0.5 mg Nebulization Q6H  . DISCONTD: levalbuterol  1.25 mg Nebulization Q6H  . DISCONTD: warfarin  7.5 mg Oral ONCE-1800    Assessment: 76 yo male with extensive CAD history, on ticagrelor, ASA, and warfarin for hx stent thrombosis and LV thrombus.  Higher risk for  bleeding due to recent groin hematoma.  INR 2.35 today.  No bleeding noted currently. I cannot tell where dose was charted as being given yesterday - pt unsure whether he received.  Goal of Therapy:  INR 2-3   Plan:  1. Will restart home dose of coumadin 7.5 mg daily except 3.75 mg on Sun, Wed. 2. Monitor for bleeding 3. AM INR.  Sherlon Handing, PharmD, BCPS 07/30/2011,10:59 AM Pager [phone]

## 2011-07-30 NOTE — Discharge Summary (Signed)
Patient ID: [name],  MRN: [id number], DOB/AGE: 1935/08/01 76 y.o.  Admit date: 07/28/2011 Discharge date: 07/30/2011  Primary Care Provider: Dr Orson Ape Primary Cardiologist: Dr Lenward Chancellor  Discharge Diagnoses Principal Problem:  *STEMI (ST elevation myocardial infarction) Active Problems:  CAD  CARDIOMYOPATHY, ISCHEMIC  COPD (chronic obstructive pulmonary disease)  DYSLIPIDEMIA  HYPERTENSION  Acute blood loss anemia  CKD (chronic kidney disease) stage 3, GFR 30-59 ml/min  Left Ventricular thrombus s/p Anterior STEMI  Right side Groin Hematoma following emergent percutaneous transluminal coronary angioplasty.   S/P evacuation of hematoma right thigh. Repair of right superficial femoral artery.   Anticoagulated on warfarin    Procedures: Emergency PCI 07/28/11  History of Present IllnessHistory: 76 y.o. male with a history of ST elevation MI June 21, 2011 with an Integrity Resolute in the proximal portion of the mid LAD. Noted that LV thrombus and was started on IV heparin to warfarin and developed a right thigh hematoma. He was followed for almost a month with this hematoma, remaining on Brilinta and warfarin without aspirin. Unfortunately, he had recurrence of bleeding in the right eye and was admitted on November 22 of Ulnar CT scan demonstrating 11.3 x 9 by 13 cm hematoma. He was taken by Dr. Soledad Gerlach to the operating room for exploration and debridement. Branch of the right SFA was found to be the culprit and was ligated. The hematoma was evacuated and the patient is restarted on heparin to Coumadin. He has been in his usual state of health until roughly 10:50 AM this morning when he developed severe test and chest pain not relieved by aspirin and nitroglycerin. He called EMS, and upon arrival (1115 hrs) ECG (1127 hrs) demonstrated Anterior ST elevations. Code STEMI was called and the patient was transported directly to cardiac catheterization lab.   Hospital Course The  patient underwent urgent intervention via the Lt groin  by Dr Ellyn Hack with an overlapping DES to the LAD ISR and PTA to the jailed Dx2.  The patient tolerated this well.  Peak Troponin was 15.63. He was transferred to telemetry and ambulated without problem.  We feel he can be d/c'd home and will f/u with Dr [name] as an OP in 2 wks.  He will f/u his INR in our office.   Discharge Vitals:  Blood pressure 120/78, pulse 83, temperature 97.5 F (36.4 C), temperature source Oral, resp. rate 18, height 5\' 7"  (1.702 m), weight 89.359 kg (197 lb), SpO2 95.00%.    Labs: Results for orders placed during the hospital encounter of 07/28/11 (from the past 48 hour(s))  MRSA PCR SCREENING     Status: Normal   Collection Time   07/28/11  2:56 PM      Component Value Range Comment   MRSA by PCR NEGATIVE  NEGATIVE    CARDIAC PANEL(CRET KIN+CKTOT+MB+TROPI)     Status: Abnormal   Collection Time   07/28/11  6:10 PM      Component Value Range Comment   Total CK 349 (*) 7 - 232 (U/L)    CK, MB 34.7 (*) 0.3 - 4.0 (ng/mL)    Troponin I 15.63 (*) <0.30 (ng/mL)    Relative Index 9.9 (*) 0.0 - 2.5    URINALYSIS, ROUTINE W REFLEX MICROSCOPIC     Status: Abnormal   Collection Time   07/28/11  6:45 PM      Component Value Range Comment   Color, Urine YELLOW  YELLOW     APPearance CLEAR  CLEAR  Specific Gravity, Urine 1.041 (*) 1.005 - 1.030     pH 8.0  5.0 - 8.0     Glucose, UA NEGATIVE  NEGATIVE (mg/dL)    Hgb urine dipstick TRACE (*) NEGATIVE     Bilirubin Urine NEGATIVE  NEGATIVE     Ketones, ur NEGATIVE  NEGATIVE (mg/dL)    Protein, ur NEGATIVE  NEGATIVE (mg/dL)    Urobilinogen, UA 0.2  0.0 - 1.0 (mg/dL)    Nitrite NEGATIVE  NEGATIVE     Leukocytes, UA NEGATIVE  NEGATIVE    URINE MICROSCOPIC-ADD ON     Status: Abnormal   Collection Time   07/28/11  6:45 PM      Component Value Range Comment   Squamous Epithelial / LPF FEW (*) RARE     RBC / HPF 0-2  <3 (RBC/hpf)    Bacteria, UA RARE  RARE    CBC      Status: Abnormal   Collection Time   07/29/11  2:26 AM      Component Value Range Comment   WBC 8.7  4.0 - 10.5 (K/uL)    RBC 2.97 (*) 4.22 - 5.81 (MIL/uL)    Hemoglobin 9.2 (*) 13.0 - 17.0 (g/dL)    HCT 28.7 (*) 39.0 - 52.0 (%)    MCV 96.6  78.0 - 100.0 (fL)    MCH 31.0  26.0 - 34.0 (pg)    MCHC 32.1  30.0 - 36.0 (g/dL)    RDW 16.2 (*) 11.5 - 15.5 (%)    Platelets 321  150 - 400 (K/uL)   BASIC METABOLIC PANEL     Status: Abnormal   Collection Time   07/29/11  2:26 AM      Component Value Range Comment   Sodium 140  135 - 145 (mEq/L)    Potassium 4.2  3.5 - 5.1 (mEq/L)    Chloride 105  96 - 112 (mEq/L)    CO2 29  19 - 32 (mEq/L)    Glucose, Bld 106 (*) 70 - 99 (mg/dL)    BUN 18  6 - 23 (mg/dL)    Creatinine, Ser 1.33  0.50 - 1.35 (mg/dL)    Calcium 8.1 (*) 8.4 - 10.5 (mg/dL)    GFR calc non Af Amer 51 (*) >90 (mL/min)    GFR calc Af Amer 59 (*) >90 (mL/min)   PROTIME-INR     Status: Abnormal   Collection Time   07/29/11  2:26 AM      Component Value Range Comment   Prothrombin Time 26.2 (*) 11.6 - 15.2 (seconds)    INR 2.36 (*) 0.00 - 1.49    CARDIAC PANEL(CRET KIN+CKTOT+MB+TROPI)     Status: Abnormal   Collection Time   07/29/11  2:27 AM      Component Value Range Comment   Total CK 382 (*) 7 - 232 (U/L)    CK, MB 30.5 (*) 0.3 - 4.0 (ng/mL) CRITICAL VALUE NOTED.  VALUE IS CONSISTENT WITH PREVIOUSLY REPORTED AND CALLED VALUE.   Troponin I 15.05 (*) <0.30 (ng/mL)    Relative Index 8.0 (*) 0.0 - 2.5    CARDIAC PANEL(CRET KIN+CKTOT+MB+TROPI)     Status: Abnormal   Collection Time   07/29/11  9:18 AM      Component Value Range Comment   Total CK 313 (*) 7 - 232 (U/L)    CK, MB 23.3 (*) 0.3 - 4.0 (ng/mL) CRITICAL VALUE NOTED.  VALUE IS CONSISTENT WITH PREVIOUSLY REPORTED AND CALLED VALUE.  Troponin I 7.44 (*) <0.30 (ng/mL)    Relative Index 7.4 (*) 0.0 - 2.5    PROTIME-INR     Status: Abnormal   Collection Time   07/30/11  6:08 AM      Component Value Range Comment    Prothrombin Time 26.1 (*) 11.6 - 15.2 (seconds)    INR 2.35 (*) 0.00 - 1.49    CBC     Status: Abnormal   Collection Time   07/30/11  6:08 AM      Component Value Range Comment   WBC 7.4  4.0 - 10.5 (K/uL)    RBC 3.14 (*) 4.22 - 5.81 (MIL/uL)    Hemoglobin 9.8 (*) 13.0 - 17.0 (g/dL)    HCT 30.6 (*) 39.0 - 52.0 (%)    MCV 97.5  78.0 - 100.0 (fL)    MCH 31.2  26.0 - 34.0 (pg)    MCHC 32.0  30.0 - 36.0 (g/dL)    RDW 16.4 (*) 11.5 - 15.5 (%)    Platelets 335  150 - 400 (K/uL)   BASIC METABOLIC PANEL     Status: Abnormal   Collection Time   07/30/11  6:08 AM      Component Value Range Comment   Sodium 141  135 - 145 (mEq/L)    Potassium 4.2  3.5 - 5.1 (mEq/L)    Chloride 106  96 - 112 (mEq/L)    CO2 25  19 - 32 (mEq/L)    Glucose, Bld 101 (*) 70 - 99 (mg/dL)    BUN 15  6 - 23 (mg/dL)    Creatinine, Ser 1.45 (*) 0.50 - 1.35 (mg/dL)    Calcium 8.4  8.4 - 10.5 (mg/dL)    GFR calc non Af Amer 46 (*) >90 (mL/min)    GFR calc Af Amer 53 (*) >90 (mL/min)     Disposition:  Follow-up Information    Follow up with Troy Sine, MD. (Office will call)    Contact information:   [place] [phone]          Discharge Medications:  Current Discharge Medication List    CONTINUE these medications which have NOT CHANGED   Details  carisoprodol (SOMA) 350 MG tablet Take 350 mg by mouth 3 (three) times daily as needed. For back pain     fluticasone (FLOVENT HFA) 220 MCG/ACT inhaler Inhale 4 puffs into the lungs 2 (two) times daily. Qty: 1 Inhaler, Refills: 5    levalbuterol (XOPENEX) 1.25 MG/0.5ML nebulizer solution Take 1.25 mg by nebulization every 6 (six) hours. Qty: 1 each, Refills: 10    metoprolol tartrate (LOPRESSOR) 12.5 mg TABS Take 0.5 tablets (12.5 mg total) by mouth 2 (two) times daily. Qty: 30 tablet, Refills: 3    montelukast (SINGULAIR) 10 MG tablet Take 10 mg by mouth daily.      Multiple Vitamins-Minerals  (MULTIVITAMINS THER. W/MINERALS) TABS Take 0.5 tablets by mouth 2 (two) times daily.      nitroGLYCERIN (NITROSTAT) 0.4 MG SL tablet Place 0.4 mg under the tongue every 5 (five) minutes as needed. For chest pain     omeprazole (PRILOSEC) 20 MG capsule Take 20 mg by mouth daily.     OVER THE COUNTER MEDICATION Place 1 drop into both eyes 2 (two) times daily. Saline Eye drops     simvastatin (ZOCOR) 40 MG tablet Take 40 mg by mouth at bedtime.      Ticagrelor (BRILINTA) 90 MG TABS  tablet Take 45 mg by mouth 2 (two) times daily.      valsartan (DIOVAN) 40 MG tablet Take 1 tablet (40 mg total) by mouth 2 (two) times daily. Qty: 60 tablet, Refills: 3    !! warfarin (COUMADIN) 7.5 MG tablet Take 7.5 mg by mouth. Takes 7.5mg  on Mon, Tues, Thurs, Fri, Sat     !! warfarin (COUMADIN) 7.5 MG tablet Take 3.75 mg by mouth. Takes 3.75mg  on Sundays and Wednesdays. Last dose was 3.75mg   on Sun 07-27-11     albuterol (PROVENTIL) (5 MG/ML) 0.5% nebulizer solution Take 0.5 mLs (2.5 mg total) by nebulization every 3 (three) hours as needed for wheezing or shortness of breath. Qty: 20 mL, Refills: 5    albuterol-ipratropium (COMBIVENT) 18-103 MCG/ACT inhaler Inhale 2 puffs into the lungs 3 (three) times daily as needed. For coughing     !! ipratropium (ATROVENT) 0.02 % nebulizer solution Take 2.5 mLs (0.5 mg total) by nebulization every 6 (six) hours. Qty: 75 mL, Refills: 10    !! ipratropium (ATROVENT) 0.02 % nebulizer solution Take 2.5 mLs (0.5 mg total) by nebulization every 3 (three) hours as needed. Qty: 75 mL, Refills: 5     !! - Potential duplicate medications found. Please discuss with provider.        Duration of Discharge Encounter: Greater than 30 minutes including physician time.  Angelena Form PA-C 07/30/2011 2:47 PM

## 2011-07-31 LAB — TYPE AND SCREEN
ABO/RH(D): A POS
Unit division: 0

## 2011-07-31 LAB — BASIC METABOLIC PANEL
BUN: 13 mg/dL (ref 6–23)
CO2: 25 mEq/L (ref 19–32)
Calcium: 8.6 mg/dL (ref 8.4–10.5)
Chloride: 108 mEq/L (ref 96–112)
Creatinine, Ser: 1.53 mg/dL — ABNORMAL HIGH (ref 0.50–1.35)
GFR calc Af Amer: 50 mL/min — ABNORMAL LOW (ref >90)
GFR calc non Af Amer: 43 mL/min — ABNORMAL LOW (ref >90)
Glucose, Bld: 103 mg/dL — ABNORMAL HIGH (ref 70–99)
Potassium: 4.7 mEq/L (ref 3.5–5.1)
Sodium: 140 mEq/L (ref 135–145)

## 2011-07-31 LAB — PROTIME-INR: Prothrombin Time: 26 seconds — ABNORMAL HIGH (ref 11.6–15.2)

## 2011-07-31 LAB — CBC
HCT: 33.1 % — ABNORMAL LOW (ref 39.0–52.0)
Hemoglobin: 10.4 g/dL — ABNORMAL LOW (ref 13.0–17.0)
MCH: 30.8 pg (ref 26.0–34.0)
MCHC: 31.4 g/dL (ref 30.0–36.0)
MCV: 97.9 fL (ref 78.0–100.0)
Platelets: 350 10*3/uL (ref 150–400)
RBC: 3.38 MIL/uL — ABNORMAL LOW (ref 4.22–5.81)
RDW: 16.3 % — ABNORMAL HIGH (ref 11.5–15.5)
WBC: 8.9 10*3/uL (ref 4.0–10.5)

## 2011-07-31 MED ORDER — CEFTRIAXONE SODIUM 1 G IJ SOLR
1.0000 g | Freq: Once | INTRAMUSCULAR | Status: DC
  Filled 2011-07-31: qty 10

## 2011-07-31 MED ORDER — ACETAMINOPHEN 325 MG PO TABS: 650.0000 mg | ORAL_TABLET | ORAL | Status: AC | PRN

## 2011-07-31 MED ORDER — CEPHALEXIN 250 MG PO CAPS: 250.0000 mg | ORAL_CAPSULE | Freq: Three times a day (TID) | ORAL | Status: AC

## 2011-07-31 MED ORDER — BISACODYL 5 MG PO TBEC
5.0000 mg | DELAYED_RELEASE_TABLET | Freq: Every day | ORAL | Status: DC | PRN
  Administered 2011-07-31: 5 mg via ORAL
  Filled 2011-07-31: qty 1

## 2011-07-31 MED ORDER — SODIUM CHLORIDE 0.9 % IV SOLN
Freq: Once | INTRAVENOUS | Status: AC
  Administered 2011-07-31: 15:00:00 via INTRAVENOUS

## 2011-07-31 MED ORDER — BISACODYL 10 MG RE SUPP
10.0000 mg | Freq: Every day | RECTAL | Status: DC | PRN
  Administered 2011-07-31: 10 mg via RECTAL
  Filled 2011-07-31: qty 1

## 2011-07-31 MED ORDER — DEXTROSE 5 % IV SOLN: 1.0000 g | Freq: Once | INTRAVENOUS | Status: DC

## 2011-07-31 MED ORDER — IPRATROPIUM BROMIDE 0.02 % IN SOLN: 0.5000 mg | RESPIRATORY_TRACT | Status: DC | PRN

## 2011-07-31 MED ORDER — ASPIRIN 81 MG PO CHEW: 81.0000 mg | CHEWABLE_TABLET | Freq: Every day | ORAL | Status: AC

## 2011-07-31 MED ORDER — ROSUVASTATIN CALCIUM 20 MG PO TABS: 20.0000 mg | ORAL_TABLET | Freq: Every day | ORAL | Status: DC

## 2011-07-31 MED ORDER — DEXTROSE 5 % IV SOLN
1.0000 g | Freq: Once | INTRAVENOUS | Status: AC
  Administered 2011-07-31: 1 g via INTRAVENOUS
  Filled 2011-07-31: qty 10

## 2011-07-31 NOTE — Progress Notes (Signed)
CARDIAC REHAB PHASE I   PRE:  Rate/Rhythm: 92 SR  BP:  Supine:   Sitting: 94/60  Standing:    SaO2: 93 ra  MODE:  Ambulation: 720 ft   POST:  Rate/Rhythem: 104  BP:  Supine:   Sitting: 94/60  Standing:    SaO2: 97 RA  1455-1525 Assisted X 1 to ambulate with hand held assist. Gait steady. Walked 720 ft without c/o of cp or dizziness. States he felt a little SOB by end of walk, RA sat at end 97 %. To bed after walk with call light in reach.  Deon Pilling

## 2011-07-31 NOTE — Progress Notes (Signed)
Went to D/C pt and B/P was 80 systolic while up in chair. Asymptomatic. Will decrease Metoprolol to BID and stop Diovan. Will give him a 250 cc saline bolus and recheck B/P later today.  Kerin Ransom PA-C 07/31/2011 2:12 PM

## 2011-07-31 NOTE — Progress Notes (Signed)
Patient's phlebitis rash spread from 6.5cm x 9cm on 07/30/11 to 12cm x 9cm on 07/31/11. Patient states "the site is itching and hard". MD notified and one time Rocephin IV ordered.  Julious Payer, RN 07/31/2011 @ 6:39 AM

## 2011-07-31 NOTE — Progress Notes (Signed)
Subjective:  No chest pain  Objective:  Vital Signs in the last 24 hours: Temp:  [97.5 F (36.4 C)-98 F (36.7 C)] 98 F (36.7 C) (12/06 0558) Pulse Rate:  [83-97] 87  (12/06 0558) Resp:  [16-18] 16  (12/06 0558) BP: (114-120)/(75-78) 115/76 mmHg (12/06 0558) SpO2:  [92 %-96 %] 94 % (12/06 0810)  Intake/Output from previous day:  Intake/Output Summary (Last 24 hours) at 07/31/11 1140 Last data filed at 07/31/11 0900  Gross per 24 hour  Intake    243 ml  Output      0 ml  Net    243 ml    Physical Exam: General appearance: alert, cooperative and no distress Lungs: clear to auscultation bilaterally Heart: regular rate and rhythm, S1, S2 normal, no murmur, click, rub or gallop   Rate: 86  Rhythm: normal sinus rhythm  Lab Results:  Basename 07/31/11 0600 07/30/11 0608  WBC 8.9 7.4  HGB 10.4* 9.8*  PLT 350 335    Basename 07/31/11 0600 07/30/11 0608  NA 140 141  K 4.7 4.2  CL 108 106  CO2 25 25  GLUCOSE 103* 101*  BUN 13 15  CREATININE 1.53* 1.45*    Basename 07/29/11 0918 07/29/11 0227  TROPONINI 7.44* 15.05*   Hepatic Function Panel  Basename 07/28/11 1205  PROT 5.7*  ALBUMIN 3.0*  AST 16  ALT 15  ALKPHOS 53  BILITOT 0.2*  BILIDIR --  IBILI --    Basename 07/28/11 1205  CHOL 126    Basename 07/31/11 0600  INR 2.34*    Imaging: No results found.  Cardiac Studies:  Assessment/Plan:   Principal Problem:  *STEMI (ST elevation myocardial infarction) Active Problems:  CAD  CARDIOMYOPATHY, ISCHEMIC  COPD (chronic obstructive pulmonary disease)  DYSLIPIDEMIA  HYPERTENSION  Acute blood loss anemia  CKD (chronic kidney disease) stage 3, GFR 30-59 ml/min  Left Ventricular thrombus s/p Anterior STEMI  Right side Groin Hematoma following emergent percutaneous transluminal coronary angioplasty.   S/P evacuation of hematoma right thigh. Repair of right superficial femoral artery.   Anticoagulated on warfarin   Plan-D?C today. F/U Dr  Volney Presser PA-C 07/31/2011, 11:40 AM  Agree with above Up in hall without problems Cath site ok Lungs clear rrr no murmur   Ok for DC.

## 2011-07-31 NOTE — Progress Notes (Signed)
ANTICOAGULATION CONSULT NOTE - Follow Up Consult  Pharmacy Consult for Coumadin  Indication: LV thrombus; Goal INR 2-3  Allergies  Allergen Reactions  . Ambien     Pt. Became confused and aggitated  . Demerol Other (See Comments)    Abnormal behavior    Patient Measurements: Height: 5\' 7"  (170.2 cm) Weight: 197 lb (89.359 kg) IBW/kg (Calculated) : 66.1    Vital Signs: Temp: 98 F (36.7 C) (12/06 0558) Temp src: Oral (12/06 0558) BP: 115/76 mmHg (12/06 0558) Pulse Rate: 87  (12/06 0558)  Labs:  Basename 07/31/11 0600 07/30/11 0608 07/29/11 0918 07/29/11 0227 07/29/11 0226 07/28/11 1810 07/28/11 1205  HGB 10.4* 9.8* -- -- -- -- --  HCT 33.1* 30.6* -- -- 28.7* -- --  PLT 350 335 -- -- 321 -- --  APTT -- -- -- -- -- -- 30  LABPROT 26.0* 26.1* -- -- 26.2* -- --  INR 2.34* 2.35* -- -- 2.36* -- --  HEPARINUNFRC -- -- -- -- -- -- --  CREATININE 1.53* 1.45* -- -- 1.33 -- --  CKTOTAL -- -- 313* 382* -- 349* --  CKMB -- -- 23.3* 30.5* -- 34.7* --  TROPONINI -- -- 7.44* 15.05* -- 15.63* --   Estimated Creatinine Clearance: 45.2 ml/min (by C-G formula based on Cr of 1.53).   Medications:  Scheduled:     . aspirin  81 mg Oral Daily  . carisoprodol  350 mg Oral TID  . cefTRIAXone (ROCEPHIN)  IV  1 g Intravenous Once  . cephALEXin  250 mg Oral Q8H  . docusate sodium  100 mg Oral BID  . fluticasone  4 puff Inhalation BID  . levalbuterol  1.25 mg Nebulization TID  . metoprolol tartrate  12.5 mg Oral Q8H  . montelukast  10 mg Oral Daily  . multivitamins ther. w/minerals  1 tablet Oral Daily  . olmesartan  5 mg Oral Daily  . pantoprazole  40 mg Oral Q1200  . rosuvastatin  20 mg Oral QHS  . sodium chloride  3 mL Intravenous Q12H  . Ticagrelor  90 mg Oral BID  . warfarin  3.75 mg Oral Custom  . warfarin  7.5 mg Oral Custom  . DISCONTD: cefTRIAXone (ROCEPHIN)  IV  1 g Intravenous Once  . DISCONTD: cefTRIAXone  1 g Intramuscular Once  . DISCONTD: metoprolol tartrate  12.5  mg Oral BID    Assessment: 76 yo male with extensive CAD history, on ticagrelor, ASA, and warfarin for hx stent thrombosis and LV thrombus.  Higher risk for bleeding due to recent groin hematoma.  INR 2.34 today.  No bleeding noted currently. Pt is on home dose of coumadin. Noted pt may go home today.  Goal of Therapy:  INR 2-3   Plan:  1. Continue home dose of coumadin 7.5 mg daily except 3.75 mg on Sun, Wed. 2. Monitor for bleeding 3. AM INR.  Sherlon Handing, PharmD, BCPS 07/31/2011,9:39 AM Pager [phone]

## 2011-07-31 NOTE — Progress Notes (Signed)
I discharged [name] in room 3711. I gave him information on current medications at home he would continue to take, as well as new medications to be administered.  I gave him a list of medications that would be discontinued. [name] received information on community resources, follow-up appointments, and information on Acute Coronary Syndrome.  I removed his iv, catheter intact, and distributed his prescriptions.  [name] was discharged successfully.

## 2011-08-12 ENCOUNTER — Encounter: Payer: Self-pay | Admitting: Vascular Surgery

## 2011-08-13 ENCOUNTER — Ambulatory Visit: Payer: Medicare Other | Admitting: Vascular Surgery

## 2011-08-13 ENCOUNTER — Encounter: Payer: Self-pay | Admitting: Vascular Surgery

## 2011-08-13 ENCOUNTER — Ambulatory Visit (INDEPENDENT_AMBULATORY_CARE_PROVIDER_SITE_OTHER): Payer: Medicare Other | Admitting: Vascular Surgery

## 2011-08-13 VITALS — BP 115/72 | HR 79 | Resp 16 | Ht 67.0 in | Wt 200.0 lb

## 2011-08-13 DIAGNOSIS — I70219 Atherosclerosis of native arteries of extremities with intermittent claudication, unspecified extremity: Secondary | ICD-10-CM

## 2011-08-13 HISTORY — DX: Atherosclerosis of native arteries of extremities with intermittent claudication, unspecified extremity: I70.219

## 2011-08-13 NOTE — Progress Notes (Signed)
[facility]  Patient name: [name] MRN: [id number] DOB: May 04, 1935 Sex: male  CC: follow up after evacuation of hematoma right groin with repair of superficial femoral artery.  HPI: [name] is a 76 y.o. male who underwent the above-mentioned procedure. He comes in for a routine outpatient visit. He has no specific complaints. He's been gradually resuming his normal activities.  REVIEW OF SYSTEMS: Valu.Nieves ] denotes positive finding; [  ] denotes negative finding CARDIOVASCULAR:  [ ]  chest pain   [ ]  chest pressure   [ ]  palpitations   [ ]  orthopnea   [ ]  dyspnea on exertion   [ ]  claudication   [ ]  rest pain   [ ]  DVT   [ ]  phlebitis PULMONARY:   [ ]  productive cough   [ ]  asthma   [ ]  wheezing CONSTITUTIONAL:  [ ]  fever   [ ]  chills  PHYSICAL EXAM: Filed Vitals:   08/13/11 1146  BP: 115/72  Pulse: 79  Resp: 16  Height: 5\' 7"  (1.702 m)  Weight: 200 lb (90.719 kg)  SpO2: 96%   Body mass index is 31.32 kg/(m^2). GENERAL: The patient is a well-nourished male, in no acute distress. The vital signs are documented above. CARDIOVASCULAR: There is a regular rate and rhythm without significant murmur appreciated.  PULMONARY: There is good air exchange bilaterally without wheezing or rales. His right groin incision is healing nicely. He has a palpable dorsalis pedis pulse on the right.   MEDICAL ISSUES: His incision is healed nicely. I'll see him back as needed.  [place] [facility] [place] Office: [phone]

## 2011-08-18 ENCOUNTER — Ambulatory Visit (INDEPENDENT_AMBULATORY_CARE_PROVIDER_SITE_OTHER): Payer: Medicare Other | Admitting: Pulmonary Disease

## 2011-08-18 ENCOUNTER — Encounter: Payer: Self-pay | Admitting: Pulmonary Disease

## 2011-08-18 VITALS — BP 102/78 | HR 67 | Temp 97.8°F | Ht 67.0 in | Wt 201.0 lb

## 2011-08-18 DIAGNOSIS — J449 Chronic obstructive pulmonary disease, unspecified: Secondary | ICD-10-CM

## 2011-08-18 DIAGNOSIS — J4489 Other specified chronic obstructive pulmonary disease: Secondary | ICD-10-CM

## 2011-08-18 DIAGNOSIS — I2510 Atherosclerotic heart disease of native coronary artery without angina pectoris: Secondary | ICD-10-CM

## 2011-08-18 NOTE — Progress Notes (Signed)
  Subjective:    Patient ID: [name], male    DOB: 03-19-1935, 76 y.o.   MRN: [id number]  HPI The patient is a 76 year old male who had been asked to see for management of COPD.  The patient has a long-standing history of COPD, but quit smoking many years ago in 2006.  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  He has a significant history of coronary artery disease and cardiomyopathy, and was recently in the hospital for acute intervention and treatment of congestive heart failure.  The patient did have pulmonary function studies today which shows severe airflow obstruction, but a significant response with bronchodilators.  He states that his breathing is significantly better since treatment of his congestive heart failure, and being on his nebulized bronchodilators.  He describes a one block dyspnea on exertion and a moderate pace on flat ground, and can get winded and bringing groceries in from the car or climbing one flight of stairs.  He did have a significant cough prior to his recent admission, now it is mild and intermittent in nature.  He is not producing any significant mucus.  He currently is wearing oxygen with exertional activities only, but rarely does this.  He has oxygen at home to wear at night, but also her rarely uses.   Review of Systems  Constitutional: Negative for fever and unexpected weight change.  HENT: Positive for congestion, postnasal drip and sinus pressure. Negative for ear pain, nosebleeds, sore throat, rhinorrhea, sneezing, trouble swallowing and dental problem.   Eyes: Negative for redness and itching.  Respiratory: Positive for cough and shortness of breath. Negative for chest tightness and wheezing.   Cardiovascular: Negative for palpitations and leg swelling.  Gastrointestinal: Negative for nausea and vomiting.  Genitourinary: Negative for dysuria.  Musculoskeletal: Negative for joint swelling.  Skin: Negative for rash.  Neurological:  Negative for headaches.  Hematological: Bruises/bleeds easily.  Psychiatric/Behavioral: Negative for dysphoric mood. The patient is not nervous/anxious.        Objective:   Physical Exam Constitutional:  Overweight male, no acute distress  HENT:  Nares patent without discharge  Oropharynx without exudate, palate and uvula are normal  Eyes:  Perrla, eomi, no scleral icterus  Neck:  No JVD, no TMG  Cardiovascular:  Normal rate, regular rhythm, no rubs or gallops.  No murmurs        Intact distal pulses  Pulmonary :  decreased breath sounds, no stridor or respiratory distress   No rales, rhonchi, or wheezing  Abdominal:  Soft, nondistended, bowel sounds present.  No tenderness noted.   Musculoskeletal:  mild lower extremity edema noted.  Lymph Nodes:  No cervical lymphadenopathy noted  Skin:  No cyanosis noted  Neurologic:  Alert, appropriate, moves all 4 extremities without obvious deficit.         Assessment & Plan:

## 2011-08-18 NOTE — Progress Notes (Signed)
PFT was done today.  

## 2011-08-18 NOTE — Patient Instructions (Addendum)
Stay on xopenex and atrovent nebs for now, but can consider longer acting meds in the future Decrease flovent dose to 2 puffs am and pm.  Keep mouth rinsed well Consider coming off singulair, and can use zyrtec 10mg  otc as needed for allergy symptoms. Would strongly consider cardiac rehab once your cardiologist clears you for this.  Work on weight loss.  Will check your oxygen level at night to document oxygen need Wear your oxygen with exertional activity. followup with me in 30mos.

## 2011-08-18 NOTE — Assessment & Plan Note (Signed)
The patient has severe air flow obstruction by his PFTs today, but a very significant response to bronchodilators.  He feels that he has made significant improvement after treatment of his heart disease, but also feels that he is doing better on nebulized bronchodilators.  I have asked him to consider long-acting medications which have been proven to be more effective.  He would like to stick with his short acting bronchodilators until he has gotten further out from his hospitalization.  I am okay with this.  I have also stressed to him the importance of oxygen therapy, a conditioning program such as cardiac rehabilitation, and finally the importance of weight loss.

## 2011-08-23 ENCOUNTER — Emergency Department (INDEPENDENT_AMBULATORY_CARE_PROVIDER_SITE_OTHER)
Admission: EM | Admit: 2011-08-23 | Discharge: 2011-08-23 | Disposition: A | Discharge status: Nursing Home (Includes ICF) | Payer: Medicare Other | Source: Home / Self Care | Attending: Family Medicine | Admitting: Family Medicine

## 2011-08-23 ENCOUNTER — Emergency Department (HOSPITAL_COMMUNITY): Payer: Medicare Other

## 2011-08-23 ENCOUNTER — Other Ambulatory Visit: Payer: Self-pay

## 2011-08-23 ENCOUNTER — Emergency Department (HOSPITAL_COMMUNITY)
Admission: EM | Admit: 2011-08-23 | Discharge: 2011-08-23 | Disposition: A | Discharge status: Home / Self Care | Payer: Medicare Other | Attending: Emergency Medicine | Admitting: Emergency Medicine

## 2011-08-23 ENCOUNTER — Encounter (HOSPITAL_COMMUNITY): Payer: Self-pay | Admitting: Emergency Medicine

## 2011-08-23 ENCOUNTER — Encounter (HOSPITAL_COMMUNITY): Payer: Self-pay | Admitting: *Deleted

## 2011-08-23 DIAGNOSIS — K219 Gastro-esophageal reflux disease without esophagitis: Secondary | ICD-10-CM | POA: Insufficient documentation

## 2011-08-23 DIAGNOSIS — I252 Old myocardial infarction: Secondary | ICD-10-CM | POA: Insufficient documentation

## 2011-08-23 DIAGNOSIS — J4489 Other specified chronic obstructive pulmonary disease: Secondary | ICD-10-CM | POA: Insufficient documentation

## 2011-08-23 DIAGNOSIS — I509 Heart failure, unspecified: Secondary | ICD-10-CM | POA: Insufficient documentation

## 2011-08-23 DIAGNOSIS — I129 Hypertensive chronic kidney disease with stage 1 through stage 4 chronic kidney disease, or unspecified chronic kidney disease: Secondary | ICD-10-CM | POA: Insufficient documentation

## 2011-08-23 DIAGNOSIS — J069 Acute upper respiratory infection, unspecified: Secondary | ICD-10-CM | POA: Insufficient documentation

## 2011-08-23 DIAGNOSIS — R0602 Shortness of breath: Secondary | ICD-10-CM | POA: Insufficient documentation

## 2011-08-23 DIAGNOSIS — I2510 Atherosclerotic heart disease of native coronary artery without angina pectoris: Secondary | ICD-10-CM | POA: Insufficient documentation

## 2011-08-23 DIAGNOSIS — J40 Bronchitis, not specified as acute or chronic: Secondary | ICD-10-CM | POA: Insufficient documentation

## 2011-08-23 DIAGNOSIS — J441 Chronic obstructive pulmonary disease with (acute) exacerbation: Secondary | ICD-10-CM

## 2011-08-23 DIAGNOSIS — J449 Chronic obstructive pulmonary disease, unspecified: Secondary | ICD-10-CM | POA: Insufficient documentation

## 2011-08-23 DIAGNOSIS — N189 Chronic kidney disease, unspecified: Secondary | ICD-10-CM | POA: Insufficient documentation

## 2011-08-23 MED ORDER — IPRATROPIUM BROMIDE 0.02 % IN SOLN
0.5000 mg | Freq: Once | RESPIRATORY_TRACT | Status: AC
  Administered 2011-08-23: 0.5 mg via RESPIRATORY_TRACT
  Filled 2011-08-23: qty 2.5

## 2011-08-23 MED ORDER — PREDNISONE 20 MG PO TABS: 40.0000 mg | ORAL_TABLET | Freq: Every day | ORAL | Status: AC

## 2011-08-23 MED ORDER — ALBUTEROL SULFATE (5 MG/ML) 0.5% IN NEBU
2.5000 mg | INHALATION_SOLUTION | RESPIRATORY_TRACT | Status: DC
  Administered 2011-08-23: 2.5 mg via RESPIRATORY_TRACT

## 2011-08-23 MED ORDER — ALBUTEROL SULFATE (5 MG/ML) 0.5% IN NEBU
INHALATION_SOLUTION | RESPIRATORY_TRACT | Status: AC
  Filled 2011-08-23: qty 1

## 2011-08-23 MED ORDER — IPRATROPIUM BROMIDE 0.02 % IN SOLN
0.5000 mg | RESPIRATORY_TRACT | Status: DC
  Administered 2011-08-23: 0.5 mg via RESPIRATORY_TRACT

## 2011-08-23 MED ORDER — PREDNISONE 20 MG PO TABS
40.0000 mg | ORAL_TABLET | Freq: Once | ORAL | Status: AC
  Administered 2011-08-23: 40 mg via ORAL
  Filled 2011-08-23: qty 2

## 2011-08-23 MED ORDER — ALBUTEROL SULFATE (5 MG/ML) 0.5% IN NEBU
2.5000 mg | INHALATION_SOLUTION | Freq: Once | RESPIRATORY_TRACT | Status: AC
  Administered 2011-08-23: 2.5 mg via RESPIRATORY_TRACT
  Filled 2011-08-23: qty 0.5

## 2011-08-23 NOTE — ED Notes (Signed)
Pt tolerating breathing treatment well per pt coughed up large amount of sputum report called to ed Humberto Leep triage nurse - to ed via shuttle

## 2011-08-23 NOTE — ED Notes (Signed)
Pt reports cold symptoms onset 08/18/2011. Pt given breathing treatment at urgent care and sent to ED for further eval.

## 2011-08-23 NOTE — ED Notes (Signed)
Pt feeling better post breathing treatment  Refused wheelchair to ed via shuttle

## 2011-08-23 NOTE — ED Notes (Signed)
Pt with cough/congestion per pt unable to sleep due to coughing onset of productive cough December 23  - MI in December released from hospital home 4 to 5 days when onset of cough - denies cp feels sob

## 2011-08-23 NOTE — ED Provider Notes (Signed)
History     CSN: [id number]  Arrival date & time 08/23/11  1138   First MD Initiated Contact with Patient 08/23/11 1206      Chief Complaint  Patient presents with  . Cough  . Nasal Congestion  . Shortness of Breath    (Consider location/radiation/quality/duration/timing/severity/associated sxs/prior treatment) HPI Comments: 76 y/o male h/o CAD, CHF, COPD, MI in October and recent admission for CHF. Here c/o SOB and productive cough for about 4 days worse today. Has been on prednisone, symbicort and bronchodilators but symptoms worsening despite of this. No fever denies chest pain. On exam mild tachypnea and bilateral wheezing, afebrile. Stopped smoking about 6 years ago.   Past Medical History  Diagnosis Date  . Myocardial infarction   . Asthma   . COPD (chronic obstructive pulmonary disease)   . Emphysema   . CAD 04/23/2009  . Anticoagulated on warfarin 07/17/2011  . CHF (congestive heart failure)   . Ventricular thrombus following MI (myocardial infarction) 11/12    on coumadin  . Angina   . Shortness of breath   . Chronic kidney disease   . Cancer   . Hypertension   . Blood transfusion 3 weeks ago  . GERD (gastroesophageal reflux disease)   . Arthritis   . Pneumonia   . Anemia   . Atherosclerosis of native arteries of the extremities with intermittent claudication 08/13/2011    Past Surgical History  Procedure Date  . Coronary stent placement 06/21/11  . Cardiac catheterization   . Groin debridement 07/18/2011    Procedure: GROIN DEBRIDEMENT;  Surgeon: Angelia Mould, MD;  Location: [facility] OR;  Service: Vascular;  Laterality: Right;  exploration of right groin,evacuation of right groin hematoma & repair of right superficial femoral artery  . Coronary angioplasty     Family History  Problem Relation Age of Onset  . Diabetes type II Mother   . Coronary artery disease Mother   . Coronary artery disease Father   . Diabetes type II Brother   . Coronary  artery disease Brother   . Craniosynostosis Neg Hx     History  Substance Use Topics  . Smoking status: Former Smoker -- 3.0 packs/day for 40 years    Types: Cigarettes    Quit date: 04/13/2005  . Smokeless tobacco: Never Used  . Alcohol Use: No      Review of Systems  Constitutional: Negative for fever and chills.  HENT: Positive for congestion and rhinorrhea. Negative for sore throat and trouble swallowing.   Respiratory: Positive for cough, shortness of breath and wheezing. Negative for chest tightness.   Cardiovascular: Negative for chest pain and leg swelling.  Gastrointestinal: Positive for nausea. Negative for vomiting and abdominal pain.  Neurological: Negative for dizziness.    Allergies  Ambien and Demerol  Home Medications   Current Outpatient Rx  Name Route Sig Dispense Refill  . ASPIRIN 81 MG PO CHEW Oral Chew 1 tablet (81 mg total) by mouth daily.    Marland Kitchen CARISOPRODOL 350 MG PO TABS Oral Take 350 mg by mouth 3 (three) times daily as needed. For back pain     . FLUTICASONE PROPIONATE  HFA 220 MCG/ACT IN AERO Inhalation Inhale 4 puffs into the lungs 2 (two) times daily. 1 Inhaler 5  . IPRATROPIUM BROMIDE 0.02 % IN SOLN Nebulization Take 2.5 mLs (0.5 mg total) by nebulization every 6 (six) hours. 75 mL 10  . LEVALBUTEROL HCL 1.25 MG/0.5ML IN NEBU Nebulization Take 1.25 mg by nebulization  every 6 (six) hours. 1 each 10  . METOPROLOL TARTRATE 12.5 MG HALF TABLET Oral Take 0.5 tablets (12.5 mg total) by mouth 2 (two) times daily. 30 tablet 3  . MONTELUKAST SODIUM 10 MG PO TABS Oral Take 10 mg by mouth daily.      Creed Copper M PLUS PO TABS Oral Take 0.5 tablets by mouth 2 (two) times daily.      Marland Kitchen NITROGLYCERIN 0.4 MG SL SUBL Sublingual Place 0.4 mg under the tongue every 5 (five) minutes as needed. For chest pain     . OMEPRAZOLE 20 MG PO CPDR Oral Take 20 mg by mouth daily.     Marland Kitchen OVER THE COUNTER MEDICATION Both Eyes Place 1 drop into both eyes 2 (two) times daily. Saline  Eye drops     . NEBULIZER AIR TUBE/PLUGS MISC Does not apply by Does not apply route.      Marland Kitchen ROSUVASTATIN CALCIUM 20 MG PO TABS Oral Take 1 tablet (20 mg total) by mouth at bedtime. 30 tablet 5  . TERAZOSIN HCL 2 MG PO CAPS Oral Take 2 mg by mouth at bedtime.      Marland Kitchen TICAGRELOR 90 MG PO TABS Oral Take 45 mg by mouth 2 (two) times daily.      Marland Kitchen VALSARTAN 40 MG PO TABS Oral Take 40 mg by mouth 2 (two) times daily.      . WARFARIN SODIUM 7.5 MG PO TABS Oral Take 7.5 mg by mouth. Takes 7.5mg  on Mon, Tues, Thurs, Fri, Sat     . WARFARIN SODIUM 7.5 MG PO TABS Oral Take 3.75 mg by mouth. Takes 3.75mg  on Sundays and Wednesdays. Last dose was 3.75mg   on Sun 07-27-11       Pulse 88  Temp(Src) 98.4 F (36.9 C) (Oral)  Resp 25  SpO2 93%  Physical Exam  Nursing note and vitals reviewed. Constitutional: He is oriented to person, place, and time. He appears well-developed and well-nourished.       Mild respiratory distress  HENT:  Head: Normocephalic and atraumatic.  Mouth/Throat: No oropharyngeal exudate.       Nasal congestion clear rhinorrhea.  Eyes: Conjunctivae and EOM are normal. Pupils are equal, round, and reactive to light. Right eye exhibits no discharge. Left eye exhibits no discharge.  Neck: Neck supple. No JVD present.  Cardiovascular: Normal rate, regular rhythm, normal heart sounds and intact distal pulses.  Exam reveals no friction rub.   No murmur heard. Pulmonary/Chest: He exhibits no tenderness.       Mild tachypnea, prolonged expiration. Bilateral wheezing.  Decreased breath sounds on bases no crackles.   Abdominal: Soft. He exhibits no distension. There is no tenderness.  Musculoskeletal: He exhibits no edema.  Lymphadenopathy:    He has no cervical adenopathy.  Neurological: He is alert and oriented to person, place, and time.  Skin: Skin is warm.    ED Course  Procedures (including critical care time) Patient had administered a duoneb x1 reported feeling better,  wheezing improved still bilateral rhonchi also I am able to hear fine crackles in left base after nebulization. Decreased tachypnea.  Labs Reviewed - No data to display No results found.   No diagnosis found.    MDM  EKG: NSR, rate 82 bpm, No acute ischemic changes.  Improved symptoms and lung exam after duoneb. COPD exacerbation in a cardiopath recovering from recent MI. Decided to transfer to the ED as patient will likely need IV steroids, chest Xrays, cardiac markers,  BNP and longer observation prior safe discharge.         Randa Spike, MD 08/23/11 1301

## 2011-08-27 ENCOUNTER — Other Ambulatory Visit: Payer: Self-pay | Admitting: *Deleted

## 2011-08-27 MED ORDER — LEVALBUTEROL HCL 1.25 MG/0.5ML IN NEBU: 1.2500 mg | INHALATION_SOLUTION | Freq: Four times a day (QID) | RESPIRATORY_TRACT | Status: DC

## 2011-08-27 MED ORDER — IPRATROPIUM BROMIDE 0.02 % IN SOLN: 500.0000 ug | Freq: Four times a day (QID) | RESPIRATORY_TRACT | Status: DC

## 2011-08-27 MED ORDER — FLUTICASONE PROPIONATE HFA 220 MCG/ACT IN AERO: 4.0000 | INHALATION_SPRAY | Freq: Two times a day (BID) | RESPIRATORY_TRACT | Status: DC

## 2011-08-29 NOTE — ED Provider Notes (Signed)
History    76yM with cough and dyspnea. Sent from urgent care for further evaluation. Gradual onset around xmas. Symptoms relatively stable. Mild sob. Hx of copd and on reports compliance with meds but not getting much relief from them. No fever or chills. No n/v. Denies cp or back pain. No unusual leg pain or swelling.  CSN: [id number]  Arrival date & time 08/23/11  1256   First MD Initiated Contact with Patient 08/23/11 1511      Chief Complaint  Patient presents with  . URI    (Consider location/radiation/quality/duration/timing/severity/associated sxs/prior treatment) HPI  Past Medical History  Diagnosis Date  . Myocardial infarction   . Asthma   . COPD (chronic obstructive pulmonary disease)   . Emphysema   . CAD 04/23/2009  . Anticoagulated on warfarin 07/17/2011  . CHF (congestive heart failure)   . Ventricular thrombus following MI (myocardial infarction) 11/12    on coumadin  . Angina   . Shortness of breath   . Chronic kidney disease   . Cancer   . Hypertension   . Blood transfusion 3 weeks ago  . GERD (gastroesophageal reflux disease)   . Arthritis   . Pneumonia   . Anemia   . Atherosclerosis of native arteries of the extremities with intermittent claudication 08/13/2011    Past Surgical History  Procedure Date  . Coronary stent placement 06/21/11  . Cardiac catheterization   . Groin debridement 07/18/2011    Procedure: GROIN DEBRIDEMENT;  Surgeon: Angelia Mould, MD;  Location: [facility] OR;  Service: Vascular;  Laterality: Right;  exploration of right groin,evacuation of right groin hematoma & repair of right superficial femoral artery  . Coronary angioplasty     Family History  Problem Relation Age of Onset  . Diabetes type II Mother   . Coronary artery disease Mother   . Coronary artery disease Father   . Diabetes type II Brother   . Coronary artery disease Brother   . Craniosynostosis Neg Hx     History  Substance Use Topics  . Smoking  status: Former Smoker -- 3.0 packs/day for 40 years    Types: Cigarettes    Quit date: 04/13/2005  . Smokeless tobacco: Never Used  . Alcohol Use: No      Review of Systems   Review of symptoms negative unless otherwise noted in HPI.    Allergies  Ambien and Demerol  Home Medications   Current Outpatient Rx  Name Route Sig Dispense Refill  . ASPIRIN 81 MG PO CHEW Oral Chew 1 tablet (81 mg total) by mouth daily.    Marland Kitchen CARISOPRODOL 350 MG PO TABS Oral Take 350 mg by mouth 3 (three) times daily as needed. For back pain     . HYDROCODONE-ACETAMINOPHEN 10-650 MG PO TABS Oral Take 1 tablet by mouth every 6 (six) hours as needed. For pain     . METOPROLOL TARTRATE 12.5 MG HALF TABLET Oral Take 12.5 mg by mouth 2 (two) times daily.      Marland Kitchen MONTELUKAST SODIUM 10 MG PO TABS Oral Take 10 mg by mouth daily.      Creed Copper M PLUS PO TABS Oral Take 0.5 tablets by mouth 2 (two) times daily.     Marland Kitchen NITROGLYCERIN 0.4 MG SL SUBL Sublingual Place 0.4 mg under the tongue every 5 (five) minutes as needed. For chest pain    . OMEPRAZOLE 20 MG PO CPDR Oral Take 20 mg by mouth at bedtime.     Marland Kitchen  OVER THE COUNTER MEDICATION Both Eyes Place 1 drop into both eyes 2 (two) times daily. Saline Eye drops    . ROSUVASTATIN CALCIUM 20 MG PO TABS Oral Take 20 mg by mouth at bedtime.      . TERAZOSIN HCL 2 MG PO CAPS Oral Take 2 mg by mouth at bedtime.     Marland Kitchen TICAGRELOR 90 MG PO TABS Oral Take 45 mg by mouth 2 (two) times daily.     Marland Kitchen VALSARTAN 40 MG PO TABS Oral Take 20 mg by mouth 2 (two) times daily.     . WARFARIN SODIUM 7.5 MG PO TABS Oral Take 3.75 mg by mouth 2 (two) times daily.      Marland Kitchen FLUTICASONE PROPIONATE  HFA 220 MCG/ACT IN AERO Inhalation Inhale 4 puffs into the lungs 2 (two) times daily. 1 Inhaler 5  . IPRATROPIUM BROMIDE 0.02 % IN SOLN Nebulization Take 2.5 mLs (500 mcg total) by nebulization 4 (four) times daily. Dx Code: Q2800020 File Under Medicare Part B 75 mL 5  . LEVALBUTEROL HCL 1.25 MG/0.5ML IN NEBU  Nebulization Take 1.25 mg by nebulization 4 (four) times daily. Dx Code; 496   File under Medicare Part B 120 vial 5  . PREDNISONE 20 MG PO TABS Oral Take 2 tablets (40 mg total) by mouth daily. 8 tablet 0    BP 130/87  Pulse 96  Temp(Src) 97.3 F (36.3 C) (Oral)  Resp 26  SpO2 93%  Physical Exam  Nursing note and vitals reviewed. Constitutional: He appears well-developed and well-nourished. No distress.  HENT:  Head: Normocephalic and atraumatic.  Eyes: Conjunctivae are normal. Pupils are equal, round, and reactive to light. Right eye exhibits no discharge. Left eye exhibits no discharge.  Neck: Neck supple.  Cardiovascular: Normal rate, regular rhythm and normal heart sounds.  Exam reveals no friction rub.   Pulmonary/Chest: Effort normal. No respiratory distress.       Mild expiratory wheezing  Abdominal: Soft. He exhibits no distension. There is no tenderness.  Musculoskeletal: He exhibits no tenderness.  Neurological: He is alert.  Skin: Skin is warm and dry.  Psychiatric: He has a normal mood and affect. His behavior is normal. Thought content normal.    ED Course  Procedures (including critical care time)  Labs Reviewed - No data to display No results found.   1. URI (upper respiratory infection)   2. Bronchitis    Dg Chest 2 View  08/23/2011  *RADIOLOGY REPORT*  Clinical Data: Cough.  Chest pressure.  Shortness of breath.  COPD.  CHEST - 2 VIEW  Comparison: 07/22/2011  Findings: Tortuous thoracic aorta noted.  Heart size is within normal limits.  Right midlung calcified granuloma is stable compared to prior exam of 07/26/2005.  No pleural effusion noted.  Mild thoracic spondylosis is present. Emphysema is present.  IMPRESSION:  1.  Emphysema. 2.  Tortuous thoracic aorta. 3.  Old granulomatous disease.  Original Report Authenticated By: Carron Curie, M.D.    MDM  [phone] with cough and congestion. Relatively clear on exam with occasional faint expiratory  wheeze. No respiratory distress. Reports feels much better since being seen at [facility]. Plan course of steroids and outpt fu.        Virgel Manifold, MD 08/29/11 [phone]

## 2011-09-08 ENCOUNTER — Telehealth: Payer: Self-pay | Admitting: Pulmonary Disease

## 2011-09-08 NOTE — Telephone Encounter (Signed)
LM for pt TCB--I spoke with the drug store and they are fixing pt's rx for a month supply for the levabuterol

## 2011-09-09 MED ORDER — LEVALBUTEROL HCL 1.25 MG/0.5ML IN NEBU: 1.2500 mg | INHALATION_SOLUTION | Freq: Four times a day (QID) | RESPIRATORY_TRACT | Status: DC

## 2011-09-09 MED ORDER — LEVALBUTEROL TARTRATE 45 MCG/ACT IN AERO: 2.0000 | INHALATION_SPRAY | RESPIRATORY_TRACT | Status: DC | PRN

## 2011-09-09 NOTE — Telephone Encounter (Signed)
Spoke with the pt. He states that this is incorrect. He had requested a 3 month supply of xopenex nebs as well as the xopenex inhaler. He states that he never said he wanted a one month supply.  I do not see that the xopenex hfa mdi is on his current list. [place], please advise if this is okay to prescribe for the pt. He states had sample before. Thanks Will await KC's response on MDI before sending nebs

## 2011-09-09 NOTE — Telephone Encounter (Signed)
Rxs were sent to pharm for 3 month supply. Pt aware.

## 2011-09-09 NOTE — Telephone Encounter (Signed)
Ok to send script for xopenex mdi to use for rescue only when away from home.

## 2011-09-15 ENCOUNTER — Telehealth: Payer: Self-pay | Admitting: Pulmonary Disease

## 2011-09-15 NOTE — Telephone Encounter (Signed)
Refills were sent on 09-09-11 and I verified with pharmacy and they have rx ready. Pt aware.[place], CMA

## 2011-09-17 ENCOUNTER — Encounter: Payer: Self-pay | Admitting: Adult Health

## 2011-10-02 ENCOUNTER — Telehealth: Payer: Self-pay

## 2011-10-02 ENCOUNTER — Encounter (INDEPENDENT_AMBULATORY_CARE_PROVIDER_SITE_OTHER): Payer: Medicare Other | Admitting: *Deleted

## 2011-10-02 ENCOUNTER — Other Ambulatory Visit: Payer: Self-pay

## 2011-10-02 DIAGNOSIS — IMO0001 Reserved for inherently not codable concepts without codable children: Secondary | ICD-10-CM

## 2011-10-02 DIAGNOSIS — S85909A Unspecified injury of unspecified blood vessel at lower leg level, unspecified leg, initial encounter: Secondary | ICD-10-CM

## 2011-10-02 DIAGNOSIS — M79606 Pain in leg, unspecified: Secondary | ICD-10-CM

## 2011-10-02 DIAGNOSIS — IMO0002 Reserved for concepts with insufficient information to code with codable children: Secondary | ICD-10-CM

## 2011-10-02 DIAGNOSIS — Z48812 Encounter for surgical aftercare following surgery on the circulatory system: Secondary | ICD-10-CM

## 2011-10-02 NOTE — Procedures (Unsigned)
LOWER EXTREMITY ARTERIAL DUPLEX  INDICATION:  Right lower extremity pain for 2 weeks.  HISTORY: Diabetes: Cardiac:  Yes. Hypertension:  Yes. Smoking: Previous Surgery:  Cardiac catheterization in 05/2011 with subsequent hematoma evacuation and right femoral artery repair.  SINGLE LEVEL ARTERIAL EXAM                         RIGHT                LEFT Brachial: Anterior tibial: Posterior tibial: Peroneal: Ankle/Brachial Index:  LOWER EXTREMITY ARTERIAL DUPLEX EXAM  DUPLEX: 1. There are triphasic waveforms and normal velocities observed in the     right common femoral, profunda femoral, and proximal segment of the     superficial femoral arteries. 2. No evidence of pseudoaneurysm or arteriovenous fistula was     observed.  IMPRESSION: 1. Normal arterial duplex of the right groin area:  The common     femoral, profunda femoral, and proximal segment of the superficial     femoral arteries. 2. A right lower extremity venous examination was performed and is     dictated on a separate report.  ___________________________________________ Jessy Oto. Fields, MD  LT/MEDQ  D:  10/02/2011  T:  10/02/2011  Job:  [id number]

## 2011-10-02 NOTE — Telephone Encounter (Signed)
Walked in to office w/c/o severe pain in right leg/ states has swelling that comes and goes in different areas of right leg.  Relates hx of stopping Warfarin 2 days ago for black stools.  Discussed w/ Dr. Oneida Alar lab only/ show MD if abnormal. Order placed for venous duplex right leg per v. o. Dr. Oneida Alar.

## 2011-10-07 ENCOUNTER — Encounter: Payer: Self-pay | Admitting: Vascular Surgery

## 2011-10-08 ENCOUNTER — Telehealth: Payer: Self-pay | Admitting: Pulmonary Disease

## 2011-10-08 ENCOUNTER — Ambulatory Visit (INDEPENDENT_AMBULATORY_CARE_PROVIDER_SITE_OTHER): Payer: Medicare Other | Admitting: Vascular Surgery

## 2011-10-08 ENCOUNTER — Encounter: Payer: Self-pay | Admitting: Vascular Surgery

## 2011-10-08 ENCOUNTER — Encounter: Payer: Self-pay | Admitting: Pulmonary Disease

## 2011-10-08 VITALS — BP 107/64 | HR 88 | Resp 16 | Ht 67.0 in | Wt 202.0 lb

## 2011-10-08 DIAGNOSIS — IMO0002 Reserved for concepts with insufficient information to code with codable children: Secondary | ICD-10-CM

## 2011-10-08 DIAGNOSIS — Z48812 Encounter for surgical aftercare following surgery on the circulatory system: Secondary | ICD-10-CM

## 2011-10-08 DIAGNOSIS — I97638 Postprocedural hematoma of a circulatory system organ or structure following other circulatory system procedure: Secondary | ICD-10-CM

## 2011-10-08 MED ORDER — ALBUTEROL SULFATE HFA 108 (90 BASE) MCG/ACT IN AERS: 2.0000 | INHALATION_SPRAY | Freq: Four times a day (QID) | RESPIRATORY_TRACT | Status: DC | PRN

## 2011-10-08 NOTE — Assessment & Plan Note (Signed)
The patient's complaining of significant pain along the medial aspect of his right thigh which sounds to be neuropathic pain. I reassured him that I do not see any evidence of arterial insufficiency or recurrent hematoma. Consideration could be given to placing him on Neurontin, however, he is on quite a few medications currently am reluctant to start this and he is in agreement with this. I'll be happy to see him back at any time if any new vascular issues arise.

## 2011-10-08 NOTE — Telephone Encounter (Signed)
Would let him know insurance is denying, and he can either pay for xopenex mdi out of pocket, or try proair.  He should stay on xopenex nebs though.

## 2011-10-08 NOTE — Telephone Encounter (Signed)
Megan reviewed the paperwork and it stated the xopenex MDI. Please advise Dr. [name], thanks

## 2011-10-08 NOTE — Telephone Encounter (Signed)
Spoke with pt and notified of recs per [name]. Pt verbalized understanding. States would like to try proair. Rx was sent to pharm.

## 2011-10-08 NOTE — Telephone Encounter (Signed)
lmomtcb x1 for pt 

## 2011-10-08 NOTE — Telephone Encounter (Signed)
Reviewed paperwork.  Looks like ins will not cover pt's xopenex and are requesting to change to alternative med.Marland KitchenMarland KitchenProair. [place], Is this appropriate for pt if we change to Proair.  Please advise. (I have put the paperwork in your VIP folder for you to review)

## 2011-10-08 NOTE — Telephone Encounter (Signed)
[name], please let pt know that his oxygen level fell only to 85% off oxygen, and he spent only 27 min the entire night less than 88%.  This does not require him to wear oxygen at night while sleeping.

## 2011-10-08 NOTE — Progress Notes (Signed)
[facility]  Patient name: [name] MRN: [id number] DOB: 03-Aug-1935 Sex: male  REASON FOR VISIT: follow up after evacuation hematoma in the right thigh.  HPI: [name] is a 77 y.o. male who developed a hematoma in the right thigh after emergent cardiac catheterization. On 07/18/2011 he underwent evacuation of hematoma) repair of the right superficial femoral artery. He states that since this event he has had persistent pain in the medial aspect of his right thigh. The pain is aggravated by any thing that type she is his skin. He does not describe any claudication or rest pain in the leg. He has had some mild swelling in the leg.  REVIEW OF SYSTEMS: Valu.Nieves ] denotes positive finding; [  ] denotes negative finding  CARDIOVASCULAR:  [ ]  chest pain   [ ]  chest pressure   [ ]  palpitations   [ ]  orthopnea   [ ]  dyspnea on exertion   [ ]  claudication   [ ]  rest pain   [ ]  DVT   [ ]  phlebitis PULMONARY:   [ ]  productive cough   [ ]  asthma   [ ]  wheezing CONSTITUTIONAL:  [ ]  fever   [ ]  chills  PHYSICAL EXAM: Filed Vitals:   10/08/11 1343  BP: 107/64  Pulse: 88  Resp: 16  Height: 5\' 7"  (1.702 m)  Weight: 202 lb (91.627 kg)  SpO2: 95%   Body mass index is 31.64 kg/(m^2). GENERAL: The patient is a well-nourished male, in no acute distress. The vital signs are documented above. CARDIOVASCULAR: There is a regular rate and rhythm without significant murmur appreciated. He has palpable femoral pulses and warm well-perfused feet. PULMONARY: There is good air exchange bilaterally without wheezing or rales. ABDOMEN: Soft and non-tender with normal pitched bowel sounds.  MUSCULOSKELETAL: There are no major deformities or cyanosis. NEUROLOGIC: No focal weakness or paresthesias are detected.  MEDICAL ISSUES:  Right side Groin Hematoma following emergent percutaneous transluminal coronary angioplasty.   S/P evacuation of hematoma right thigh. Repair of right  superficial femoral artery.  The patient's complaining of significant pain along the medial aspect of his right thigh which sounds to be neuropathic pain. I reassured him that I do not see any evidence of arterial insufficiency or recurrent hematoma. Consideration could be given to placing him on Neurontin, however, he is on quite a few medications currently am reluctant to start this and he is in agreement with this. I'll be happy to see him back at any time if any new vascular issues arise.    [facility] [place] Beeper: [phone]

## 2011-10-08 NOTE — Telephone Encounter (Signed)
Are they not covering xopenex in neb, his mdi, or both.

## 2011-10-09 NOTE — Telephone Encounter (Signed)
Called and spoke with pt. Pt aware of ONO results and [facility]'s response.

## 2011-10-17 NOTE — Procedures (Unsigned)
DUPLEX DEEP VENOUS EXAM - LOWER EXTREMITY  INDICATION:  Right lower extremity pain for 2 weeks.  HISTORY:  Edema:  Yes. Trauma/Surgery:  Cardiac catheterization, 06/21/2011 with subsequent hematoma evacuation and femoral arterial repair. Pain:  Yes. PE:  No. Previous DVT:  No. Anticoagulants:  Previously; patient came off warfarin 2 days ago. Other:  DUPLEX EXAM:               CFV   SFV   PopV  PTV    GSV               R  L  R  L  R  L  R   L  R  L Thrombosis    o  o  o     o     o      o Spontaneous   +  +  +     + Phasic        +  +  +     + Augmentation  +  +  +     +     +      + Compressible  +  +  +     +     +      + Competent     +  +  +     o     +      +  Legend:  + - yes  o - no  p - partial  D - decreased  IMPRESSION:  No evidence deep venous thrombosis or superficial venous thrombus in the right lower extremity.   _____________________________ Jessy Oto Fields, MD  LT/MEDQ  D:  10/02/2011  T:  10/02/2011  Job:  [id number]

## 2011-11-17 ENCOUNTER — Ambulatory Visit: Payer: Medicare Other | Admitting: Pulmonary Disease

## 2011-11-24 ENCOUNTER — Encounter: Payer: Self-pay | Admitting: Pulmonary Disease

## 2011-11-24 ENCOUNTER — Ambulatory Visit (INDEPENDENT_AMBULATORY_CARE_PROVIDER_SITE_OTHER): Payer: Medicare Other | Admitting: Pulmonary Disease

## 2011-11-24 VITALS — BP 112/76 | HR 112 | Temp 98.3°F | Ht 67.0 in | Wt 204.0 lb

## 2011-11-24 DIAGNOSIS — J4489 Other specified chronic obstructive pulmonary disease: Secondary | ICD-10-CM

## 2011-11-24 DIAGNOSIS — J449 Chronic obstructive pulmonary disease, unspecified: Secondary | ICD-10-CM

## 2011-11-24 NOTE — Progress Notes (Signed)
  Subjective:    Patient ID: [name], male    DOB: 09-08-1934, 77 y.o.   MRN: [id number]  HPI The patient comes in today for followup of his significant emphysema with chronic respiratory failure.  He also has a known cardiomyopathy, and has ongoing issues with congestive heart failure.  He continues to have significant dyspnea on exertion, but has not had a COPD or congestive heart failure exacerbation since December.  He denies any significant chest congestion, has had no purulent mucus of significance..  He has not had worsening lower extremity edema.   Review of Systems  Constitutional: Negative.  Negative for fever and unexpected weight change.  HENT: Positive for sneezing and postnasal drip. Negative for ear pain, nosebleeds, congestion, sore throat, rhinorrhea, trouble swallowing, dental problem and sinus pressure.   Eyes: Positive for redness and itching.  Respiratory: Positive for cough, chest tightness, shortness of breath and wheezing.   Cardiovascular: Negative.  Negative for palpitations and leg swelling.  Gastrointestinal: Negative.  Negative for nausea and vomiting.  Genitourinary: Negative.  Negative for dysuria.  Musculoskeletal: Negative.  Negative for joint swelling.  Skin: Negative.  Negative for rash.  Neurological: Negative.  Negative for headaches.  Hematological: Negative.  Does not bruise/bleed easily.  Psychiatric/Behavioral: Negative for dysphoric mood. The patient is nervous/anxious.        Objective:   Physical Exam Obese male in no acute distress Nose without purulence or discharge noted Chest with decreased breath sounds, but reasonable air flow.  No wheezing noted Cardiac exam with regular rate and rhythm Lower extremities with minimal edema, no cyanosis Alert and oriented, moves all 4 extremities.       Assessment & Plan:

## 2011-11-24 NOTE — Assessment & Plan Note (Signed)
The patient continues to have significant dyspnea on exertion, but at least it is staying at his usual baseline.  He is on a reasonable bronchodilator regimen, and does not appear to be significantly volume overloaded at this time.  He has no bronchospasm on exam today.  I have asked him to increase his pulse oxygen when he is exerting himself, and can turn it down when he is at rest.  It may ultimately come to him being on continuous oxygen only at some point.  I discussed the possibility of treating him with chronic low dose prednisone, but both of [place] have decided to hold off at this time.  He is to call me if his breathing worsens.

## 2011-11-24 NOTE — Patient Instructions (Signed)
No change in your maintenance meds. If you have worsening shortness of breath, please call.  We can consider a short prednisone pulse to see if will help Increase oxygen to 3-4 liters pulsed when you are exerting yourself consistently.  Ok to wear oxygen at night while sleeping.  If stable, followup with me in 44mos .

## 2012-02-05 ENCOUNTER — Encounter (HOSPITAL_COMMUNITY): Payer: Self-pay | Admitting: Pharmacy Technician

## 2012-02-06 ENCOUNTER — Other Ambulatory Visit: Payer: Self-pay | Admitting: Cardiovascular Disease

## 2012-02-08 MED ORDER — CEFAZOLIN SODIUM-DEXTROSE 2-3 GM-% IV SOLR
2.0000 g | INTRAVENOUS | Status: DC
  Filled 2012-02-08: qty 50

## 2012-02-08 MED ORDER — SODIUM CHLORIDE 0.9 % IR SOLN
80.0000 mg | Status: DC
  Filled 2012-02-08: qty 2

## 2012-02-09 ENCOUNTER — Encounter (HOSPITAL_COMMUNITY)
Admission: RE | Disposition: A | Discharge status: Home / Self Care | Payer: Self-pay | Source: Ambulatory Visit | Attending: Cardiovascular Disease

## 2012-02-09 ENCOUNTER — Inpatient Hospital Stay (HOSPITAL_COMMUNITY)
Admission: RE | Admit: 2012-02-09 | Discharge: 2012-02-11 | DRG: 227 | Disposition: A | Discharge status: Home / Self Care | Payer: Medicare Other | Source: Ambulatory Visit | Attending: Cardiovascular Disease | Admitting: Cardiovascular Disease

## 2012-02-09 DIAGNOSIS — N183 Chronic kidney disease, stage 3 unspecified: Secondary | ICD-10-CM | POA: Diagnosis present

## 2012-02-09 DIAGNOSIS — I70219 Atherosclerosis of native arteries of extremities with intermittent claudication, unspecified extremity: Secondary | ICD-10-CM | POA: Diagnosis present

## 2012-02-09 DIAGNOSIS — I5022 Chronic systolic (congestive) heart failure: Secondary | ICD-10-CM | POA: Diagnosis present

## 2012-02-09 DIAGNOSIS — C61 Malignant neoplasm of prostate: Secondary | ICD-10-CM | POA: Diagnosis present

## 2012-02-09 DIAGNOSIS — Z95810 Presence of automatic (implantable) cardiac defibrillator: Secondary | ICD-10-CM | POA: Diagnosis not present

## 2012-02-09 DIAGNOSIS — J4489 Other specified chronic obstructive pulmonary disease: Secondary | ICD-10-CM | POA: Diagnosis present

## 2012-02-09 DIAGNOSIS — I509 Heart failure, unspecified: Secondary | ICD-10-CM | POA: Diagnosis present

## 2012-02-09 DIAGNOSIS — T82198A Other mechanical complication of other cardiac electronic device, initial encounter: Secondary | ICD-10-CM | POA: Diagnosis not present

## 2012-02-09 DIAGNOSIS — I2589 Other forms of chronic ischemic heart disease: Principal | ICD-10-CM | POA: Diagnosis present

## 2012-02-09 DIAGNOSIS — I240 Acute coronary thrombosis not resulting in myocardial infarction: Secondary | ICD-10-CM | POA: Diagnosis present

## 2012-02-09 DIAGNOSIS — Y9210 Unspecified residential institution as the place of occurrence of the external cause: Secondary | ICD-10-CM | POA: Diagnosis not present

## 2012-02-09 DIAGNOSIS — I129 Hypertensive chronic kidney disease with stage 1 through stage 4 chronic kidney disease, or unspecified chronic kidney disease: Secondary | ICD-10-CM | POA: Diagnosis present

## 2012-02-09 DIAGNOSIS — E785 Hyperlipidemia, unspecified: Secondary | ICD-10-CM | POA: Diagnosis present

## 2012-02-09 DIAGNOSIS — I2510 Atherosclerotic heart disease of native coronary artery without angina pectoris: Secondary | ICD-10-CM | POA: Diagnosis present

## 2012-02-09 DIAGNOSIS — I10 Essential (primary) hypertension: Secondary | ICD-10-CM | POA: Diagnosis present

## 2012-02-09 DIAGNOSIS — I2102 ST elevation (STEMI) myocardial infarction involving left anterior descending coronary artery: Secondary | ICD-10-CM | POA: Diagnosis present

## 2012-02-09 DIAGNOSIS — I252 Old myocardial infarction: Secondary | ICD-10-CM

## 2012-02-09 DIAGNOSIS — I5023 Acute on chronic systolic (congestive) heart failure: Secondary | ICD-10-CM | POA: Diagnosis present

## 2012-02-09 DIAGNOSIS — Y831 Surgical operation with implant of artificial internal device as the cause of abnormal reaction of the patient, or of later complication, without mention of misadventure at the time of the procedure: Secondary | ICD-10-CM | POA: Diagnosis not present

## 2012-02-09 DIAGNOSIS — Z9861 Coronary angioplasty status: Secondary | ICD-10-CM

## 2012-02-09 DIAGNOSIS — Z7901 Long term (current) use of anticoagulants: Secondary | ICD-10-CM

## 2012-02-09 DIAGNOSIS — J449 Chronic obstructive pulmonary disease, unspecified: Secondary | ICD-10-CM | POA: Diagnosis present

## 2012-02-09 HISTORY — DX: Presence of automatic (implantable) cardiac defibrillator: Z95.810

## 2012-02-09 HISTORY — PX: IMPLANTABLE CARDIOVERTER DEFIBRILLATOR IMPLANT: SHX5473

## 2012-02-09 HISTORY — PX: INSERT / REPLACE / REMOVE PACEMAKER: SUR710

## 2012-02-09 LAB — PROTIME-INR
INR: 1.09 (ref 0.00–1.49)
Prothrombin Time: 14.3 s (ref 11.6–15.2)

## 2012-02-09 SURGERY — IMPLANTABLE CARDIOVERTER DEFIBRILLATOR IMPLANT
Anesthesia: LOCAL

## 2012-02-09 MED ORDER — SODIUM CHLORIDE 0.9 % IJ SOLN: 3.0000 mL | Freq: Two times a day (BID) | INTRAMUSCULAR | Status: DC

## 2012-02-09 MED ORDER — CEFAZOLIN SODIUM 1-5 GM-% IV SOLN
1.0000 g | Freq: Four times a day (QID) | INTRAVENOUS | Status: AC
  Administered 2012-02-09 – 2012-02-10 (×3): 1 g via INTRAVENOUS
  Filled 2012-02-09 (×4): qty 50

## 2012-02-09 MED ORDER — LIDOCAINE HCL (PF) 1 % IJ SOLN
INTRAMUSCULAR | Status: AC
  Filled 2012-02-09: qty 60

## 2012-02-09 MED ORDER — MIDAZOLAM HCL 2 MG/2ML IJ SOLN
INTRAMUSCULAR | Status: AC
  Filled 2012-02-09: qty 2

## 2012-02-09 MED ORDER — MONTELUKAST SODIUM 10 MG PO TABS
10.0000 mg | ORAL_TABLET | Freq: Every day | ORAL | Status: DC
  Administered 2012-02-10 – 2012-02-11 (×2): 10 mg via ORAL
  Filled 2012-02-09 (×2): qty 1

## 2012-02-09 MED ORDER — HYDROCODONE-ACETAMINOPHEN 10-325 MG PO TABS: 1.0000 | ORAL_TABLET | ORAL | Status: DC | PRN

## 2012-02-09 MED ORDER — ACETAMINOPHEN 325 MG PO TABS
325.0000 mg | ORAL_TABLET | ORAL | Status: DC | PRN
  Administered 2012-02-09: 650 mg via ORAL
  Filled 2012-02-09: qty 2

## 2012-02-09 MED ORDER — HEPARIN (PORCINE) IN NACL 2-0.9 UNIT/ML-% IJ SOLN
INTRAMUSCULAR | Status: AC
  Filled 2012-02-09: qty 1000

## 2012-02-09 MED ORDER — NITROGLYCERIN 0.4 MG SL SUBL: 0.4000 mg | SUBLINGUAL_TABLET | SUBLINGUAL | Status: DC | PRN

## 2012-02-09 MED ORDER — TERAZOSIN HCL 2 MG PO CAPS
2.0000 mg | ORAL_CAPSULE | Freq: Every day | ORAL | Status: DC
  Administered 2012-02-09 – 2012-02-10 (×2): 2 mg via ORAL
  Filled 2012-02-09 (×3): qty 1

## 2012-02-09 MED ORDER — FLUTICASONE PROPIONATE HFA 220 MCG/ACT IN AERO
2.0000 | INHALATION_SPRAY | Freq: Two times a day (BID) | RESPIRATORY_TRACT | Status: DC
  Administered 2012-02-09 – 2012-02-11 (×4): 2 via RESPIRATORY_TRACT
  Filled 2012-02-09: qty 12

## 2012-02-09 MED ORDER — ALBUTEROL SULFATE HFA 108 (90 BASE) MCG/ACT IN AERS
2.0000 | INHALATION_SPRAY | Freq: Four times a day (QID) | RESPIRATORY_TRACT | Status: DC | PRN
  Filled 2012-02-09: qty 6.7

## 2012-02-09 MED ORDER — IRBESARTAN 75 MG PO TABS
75.0000 mg | ORAL_TABLET | Freq: Every day | ORAL | Status: DC
  Administered 2012-02-10 – 2012-02-11 (×2): 75 mg via ORAL
  Filled 2012-02-09 (×2): qty 1

## 2012-02-09 MED ORDER — PANTOPRAZOLE SODIUM 40 MG PO TBEC
40.0000 mg | DELAYED_RELEASE_TABLET | Freq: Every day | ORAL | Status: DC
  Administered 2012-02-09 – 2012-02-10 (×2): 40 mg via ORAL
  Filled 2012-02-09 (×2): qty 1

## 2012-02-09 MED ORDER — MUPIROCIN 2 % EX OINT
TOPICAL_OINTMENT | Freq: Two times a day (BID) | CUTANEOUS | Status: DC
  Administered 2012-02-09: 08:00:00 via NASAL
  Filled 2012-02-09: qty 22

## 2012-02-09 MED ORDER — LEVALBUTEROL HCL 0.63 MG/3ML IN NEBU
0.6300 mg | INHALATION_SOLUTION | RESPIRATORY_TRACT | Status: DC | PRN
  Administered 2012-02-09 – 2012-02-10 (×2): 0.63 mg via RESPIRATORY_TRACT
  Filled 2012-02-09: qty 3

## 2012-02-09 MED ORDER — SODIUM CHLORIDE 0.9 % IJ SOLN: 3.0000 mL | INTRAMUSCULAR | Status: DC | PRN

## 2012-02-09 MED ORDER — IPRATROPIUM BROMIDE 0.02 % IN SOLN
500.0000 ug | Freq: Four times a day (QID) | RESPIRATORY_TRACT | Status: DC
  Administered 2012-02-09 – 2012-02-10 (×3): 500 ug via RESPIRATORY_TRACT
  Filled 2012-02-09 (×3): qty 2.5

## 2012-02-09 MED ORDER — CEFAZOLIN SODIUM-DEXTROSE 2-3 GM-% IV SOLR
INTRAVENOUS | Status: AC
  Filled 2012-02-09: qty 50

## 2012-02-09 MED ORDER — MUPIROCIN 2 % EX OINT
TOPICAL_OINTMENT | CUTANEOUS | Status: AC
  Filled 2012-02-09: qty 22

## 2012-02-09 MED ORDER — LEVALBUTEROL HCL 0.63 MG/3ML IN NEBU
0.6300 mg | INHALATION_SOLUTION | Freq: Four times a day (QID) | RESPIRATORY_TRACT | Status: DC
  Administered 2012-02-09 – 2012-02-10 (×2): 0.63 mg via RESPIRATORY_TRACT
  Filled 2012-02-09 (×8): qty 3

## 2012-02-09 MED ORDER — LIDOCAINE HCL (PF) 1 % IJ SOLN
INTRAMUSCULAR | Status: AC
  Filled 2012-02-09: qty 30

## 2012-02-09 MED ORDER — CHLORHEXIDINE GLUCONATE 4 % EX LIQD
60.0000 mL | Freq: Once | CUTANEOUS | Status: DC
  Filled 2012-02-09: qty 60

## 2012-02-09 MED ORDER — SODIUM CHLORIDE 0.9 % IV SOLN: INTRAVENOUS | Status: DC

## 2012-02-09 MED ORDER — ONDANSETRON HCL 4 MG/2ML IJ SOLN: 4.0000 mg | Freq: Four times a day (QID) | INTRAMUSCULAR | Status: DC | PRN

## 2012-02-09 MED ORDER — CARISOPRODOL 350 MG PO TABS: 350.0000 mg | ORAL_TABLET | Freq: Three times a day (TID) | ORAL | Status: DC | PRN

## 2012-02-09 MED ORDER — SODIUM CHLORIDE 0.9 % IV SOLN: 250.0000 mL | INTRAVENOUS | Status: DC | PRN

## 2012-02-09 MED ORDER — FENTANYL CITRATE 0.05 MG/ML IJ SOLN
INTRAMUSCULAR | Status: AC
  Filled 2012-02-09: qty 2

## 2012-02-09 MED ORDER — ATORVASTATIN CALCIUM 40 MG PO TABS
40.0000 mg | ORAL_TABLET | Freq: Every day | ORAL | Status: DC
  Administered 2012-02-09 – 2012-02-10 (×2): 40 mg via ORAL
  Filled 2012-02-09 (×3): qty 1

## 2012-02-09 MED ORDER — ASPIRIN 81 MG PO CHEW
81.0000 mg | CHEWABLE_TABLET | Freq: Every day | ORAL | Status: DC
  Administered 2012-02-10 – 2012-02-11 (×2): 81 mg via ORAL
  Filled 2012-02-09 (×2): qty 1

## 2012-02-09 MED ORDER — SODIUM CHLORIDE 0.45 % IV SOLN
INTRAVENOUS | Status: DC
  Administered 2012-02-09: 07:00:00 via INTRAVENOUS

## 2012-02-09 NOTE — CV Procedure (Signed)
Delman, Stromain Male, 77 y.o., 11-17-1934  Location: [facility]  Bed: NONE  MRN: [id number]  CSN: [id number]  Procedure report  Procedure performed:  1. Implantation of new dual chamber cardioverter defibrillator 2. Fluoroscopy 3. Moderate sedation 4. Initial lead and generator testing  Reason for procedure: Primary prevention of sudden cardiac death Ischemic cardiomyopathy, left ventricular ejection fraction less than 30%, Heart failure NYHA class III, on comprehensive medical therapy for over 90 days (MADIT-II)  Procedure performed by: Sanda Klein, MD  Complications: None  Estimated blood loss: <10 mL  Medications administered during procedure: Ancef 1 g intravenously Lidocaine 1% 30 mL locally,  Fentanyl 100 mcg intravenously Versed 6 mg intravenously  Device details: Generator Medtronic Protecta [id number] serial number [id number] Right atrial lead Medtronic 5076-52 cm serial number [id number] Right ventricular lead Medtronic Sprint Quattro secure 6935M - 62 cm serial number [id number] (single coil IS4)  Procedure details:  After the risks and benefits of the procedure were discussed the patient provided informed consent and was brought to the cardiac cath lab in the fasting state. The patient was prepped and draped in usual sterile fashion. Local anesthesia with 1% lidocaine was administered to to the left infraclavicular area. A 5-6 cm horizontal incision was made parallel with and 2-3 cm caudal to the left clavicle. Using electrocautery and blunt dissection a prepectoral pocket was created down to the level of the pectoralis major muscle fascia. The pocket was carefully inspected for hemostasis. An antibiotic-soaked sponge was placed in the pocket.  Under fluoroscopic guidance and using the modified Seldinger technique 2 separate venipunctures were performed to access the left subclavian vein. minimal difficulty was encountered accessing the vein.  Two J-tip  guidewires were subsequently exchanged for 7 [place] and 9.5 [place] safe sheaths.  Under fluoroscopic guidance the ventricular lead was advanced to level of the mid to apical right ventricular septum and thet active-fixation helix was deployed. Prominent current of injury was seen. Satisfactory pacing and sensing parameters were recorded. There was no evidence of diaphragmatic stimulation at maximum device output. The safe sheath was peeled away and the lead was secured in place with 2-0 silk.  In similar fashion the right atrial lead was advanced to the level of the atrial appendage. The active-fixation helix was deployed. There was prominent current of injury. Satisfactory  pacing and sensing parameters were recorded. There was no evidence of diaphragmatic stimulation with pacing at maximum device output. The safe sheath was peeled away and the lead was secured in place with 2-0 silk.  The antibiotic-soaked sponge was removed from the pocket. The pocket was flushed with copious amounts of antibiotic solution. Reinspection showed excellent hemostasis.  The ventricular lead was connected to the generator and appropriate ventricular pacing was seen. Subsequently the atrial lead was also connected. Repeat testing of the lead parameters later showed excellent values.  The entire system was then carefully inserted in the pocket with care been taking that the leads and device assumed a comfortable position without pressure on the incision. Great care was taken that the leads be located deep to the generator. The pocket was then closed in layers using 2 layers of 2-0 Vicryl and cutaneous staples, after which a sterile dressing was applied.  Defibrillation threshold testing was then performed. After adequate sedation was achieved, ventricular fibrillation was induced with a 1J shock on T method. There was appropriate sensing by the device. There were 2 dropouts during "least sensitive" settings. The arrhythmia  was terminated by a single  15J shock. High voltage impedance during the shock was 78. Retesting of the leads confirmed normal function.  At the end of the procedure the following lead parameters were encountered:  Right atrial lead  sensed P waves 4 mV, impedance 631ohms, threshold 1.6 V at 0.5 ms pulse width.  Right ventricular lead sensed R waves 11.8 mV, impedance 1136ohms, threshold 1.2 V at 0.5 ms pulse width.  High voltage impedance 74, charge time 9 seconds.  Sanda Klein, MD, [place] [phone] office [phone] pager 02/09/2012 12:28 PM   Cc: Dr. [name], Dr. Orson Ape

## 2012-02-09 NOTE — H&P (Signed)
Date of Initial H&P: 01/29/12  History reviewed, patient examined, no change in status, stable for surgery. Severe ischemic cardiomyopathy.  Primary prevention ICD implantation for ischemic cardiomyopathy (Prior myocardial infarction, left ventricular ejection fraction under 35%, heart failure NYHA class II-III, on comprehensive medical therapy). This procedure has been fully reviewed with the patient and written informed consent has been obtained. Sanda Klein, MD, [place] [phone] office [phone] pager 02/09/2012 8:59 AM

## 2012-02-10 ENCOUNTER — Encounter (HOSPITAL_COMMUNITY)
Admission: RE | Disposition: A | Discharge status: Home / Self Care | Payer: Self-pay | Source: Ambulatory Visit | Attending: Cardiovascular Disease

## 2012-02-10 ENCOUNTER — Ambulatory Visit (HOSPITAL_COMMUNITY): Payer: Medicare Other

## 2012-02-10 ENCOUNTER — Encounter (HOSPITAL_COMMUNITY): Payer: Self-pay

## 2012-02-10 DIAGNOSIS — Z95810 Presence of automatic (implantable) cardiac defibrillator: Secondary | ICD-10-CM

## 2012-02-10 HISTORY — PX: LEAD REVISION: SHX5945

## 2012-02-10 HISTORY — DX: Presence of automatic (implantable) cardiac defibrillator: Z95.810

## 2012-02-10 LAB — CBC
HCT: 36.1 % — ABNORMAL LOW (ref 39.0–52.0)
Hemoglobin: 11.9 g/dL — ABNORMAL LOW (ref 13.0–17.0)
MCH: 28.6 pg (ref 26.0–34.0)
MCV: 86.8 fL (ref 78.0–100.0)
RBC: 4.16 MIL/uL — ABNORMAL LOW (ref 4.22–5.81)

## 2012-02-10 LAB — BASIC METABOLIC PANEL
CO2: 23 mEq/L (ref 19–32)
Chloride: 106 mEq/L (ref 96–112)
GFR calc non Af Amer: 45 mL/min — ABNORMAL LOW (ref >90)
Glucose, Bld: 117 mg/dL — ABNORMAL HIGH (ref 70–99)
Potassium: 4.2 mEq/L (ref 3.5–5.1)
Sodium: 140 mEq/L (ref 135–145)

## 2012-02-10 SURGERY — LEAD REVISION
Anesthesia: LOCAL

## 2012-02-10 MED ORDER — CHLORHEXIDINE GLUCONATE 4 % EX LIQD
60.0000 mL | Freq: Once | CUTANEOUS | Status: AC
  Administered 2012-02-10: 4 via TOPICAL
  Filled 2012-02-10: qty 60

## 2012-02-10 MED ORDER — SODIUM CHLORIDE 0.9 % IJ SOLN
3.0000 mL | Freq: Four times a day (QID) | INTRAMUSCULAR | Status: DC
  Administered 2012-02-11 (×2): 3 mL via INTRAVENOUS

## 2012-02-10 MED ORDER — ONDANSETRON HCL 4 MG/2ML IJ SOLN: 4.0000 mg | Freq: Four times a day (QID) | INTRAMUSCULAR | Status: DC | PRN

## 2012-02-10 MED ORDER — FENTANYL CITRATE 0.05 MG/ML IJ SOLN
INTRAMUSCULAR | Status: AC
  Filled 2012-02-10: qty 2

## 2012-02-10 MED ORDER — LIDOCAINE HCL (PF) 1 % IJ SOLN
INTRAMUSCULAR | Status: AC
  Filled 2012-02-10: qty 60

## 2012-02-10 MED ORDER — SODIUM CHLORIDE 0.45 % IV SOLN
INTRAVENOUS | Status: DC
  Administered 2012-02-10: 10:00:00 via INTRAVENOUS

## 2012-02-10 MED ORDER — SODIUM CHLORIDE 0.9 % IJ SOLN: 3.0000 mL | INTRAMUSCULAR | Status: DC | PRN

## 2012-02-10 MED ORDER — SODIUM CHLORIDE 0.9 % IR SOLN
80.0000 mg | Status: AC
  Administered 2012-02-10: 80 mg
  Filled 2012-02-10: qty 2

## 2012-02-10 MED ORDER — HYDROCODONE-ACETAMINOPHEN 5-325 MG PO TABS
1.0000 | ORAL_TABLET | ORAL | Status: DC | PRN
  Administered 2012-02-11: 1 via ORAL
  Filled 2012-02-10: qty 2

## 2012-02-10 MED ORDER — FENTANYL CITRATE 0.05 MG/ML IJ SOLN: 25.0000 ug | INTRAMUSCULAR | Status: DC | PRN

## 2012-02-10 MED ORDER — SODIUM CHLORIDE 0.9 % IJ SOLN: 3.0000 mL | Freq: Two times a day (BID) | INTRAMUSCULAR | Status: DC

## 2012-02-10 MED ORDER — SODIUM CHLORIDE 0.9 % IV SOLN: 250.0000 mL | INTRAVENOUS | Status: DC

## 2012-02-10 MED ORDER — YOU HAVE A PACEMAKER BOOK
Freq: Once | Status: AC
  Administered 2012-02-10: 09:00:00
  Filled 2012-02-10: qty 1

## 2012-02-10 MED ORDER — SODIUM CHLORIDE 0.9 % IV SOLN: 250.0000 mL | INTRAVENOUS | Status: DC | PRN

## 2012-02-10 MED ORDER — IPRATROPIUM BROMIDE 0.02 % IN SOLN
500.0000 ug | Freq: Four times a day (QID) | RESPIRATORY_TRACT | Status: DC
  Administered 2012-02-10 – 2012-02-11 (×4): 500 ug via RESPIRATORY_TRACT
  Filled 2012-02-10 (×4): qty 2.5

## 2012-02-10 MED ORDER — MIDAZOLAM HCL 2 MG/2ML IJ SOLN
INTRAMUSCULAR | Status: AC
  Filled 2012-02-10: qty 2

## 2012-02-10 MED ORDER — ACETAMINOPHEN 325 MG PO TABS: 325.0000 mg | ORAL_TABLET | ORAL | Status: DC | PRN

## 2012-02-10 MED ORDER — LEVALBUTEROL HCL 0.63 MG/3ML IN NEBU
0.6300 mg | INHALATION_SOLUTION | Freq: Four times a day (QID) | RESPIRATORY_TRACT | Status: DC
  Administered 2012-02-10 – 2012-02-11 (×4): 0.63 mg via RESPIRATORY_TRACT
  Filled 2012-02-10 (×8): qty 3

## 2012-02-10 MED ORDER — CEFAZOLIN SODIUM 1-5 GM-% IV SOLN
1.0000 g | Freq: Four times a day (QID) | INTRAVENOUS | Status: AC
  Administered 2012-02-10 – 2012-02-11 (×3): 1 g via INTRAVENOUS
  Filled 2012-02-10 (×3): qty 50

## 2012-02-10 MED ORDER — SODIUM CHLORIDE 0.9 % IV SOLN
INTRAVENOUS | Status: DC
  Administered 2012-02-11: 250 mL via INTRAVENOUS

## 2012-02-10 MED ORDER — MIDAZOLAM HCL 2 MG/2ML IJ SOLN
INTRAMUSCULAR | Status: AC
  Filled 2012-02-10: qty 4

## 2012-02-10 MED ORDER — CEFAZOLIN SODIUM-DEXTROSE 2-3 GM-% IV SOLR
2.0000 g | INTRAVENOUS | Status: AC
  Administered 2012-02-10: 2 g via INTRAVENOUS
  Filled 2012-02-10: qty 50

## 2012-02-10 NOTE — Progress Notes (Signed)
Orthopedic Tech Progress Note Patient Details:  [name] 11-10-1934 [id number]  Ortho Devices Type of Ortho Device: Shoulder abduction pillow Ortho Device/Splint Location: left arm Ortho Device/Splint Interventions: Application   Hildred Priest 02/10/2012, 9:13 PM

## 2012-02-10 NOTE — CV Procedure (Signed)
[name], Wingerd Male, 77 y.o., [birth year]  Location: [facility]  Bed: 2502-01  MRN: [id number]  CSN: [id number]   Procedure report  Procedure performed:  1. Revision of dislodged defibrillator lead 2. Fluoroscopy 3. Moderate sedation   Reason for procedure: Defibrillator lead dislodgment Primary prevention of sudden cardiac death Ischemic cardiomyopathy, left ventricular ejection fraction less than 30%, Heart failure NYHA class II-III, on comprehensive medical therapy for over 90 days (MADIT-II)  Procedure performed by: Sanda Klein, MD  Complications: None  Estimated blood loss: <10 mL  Medications administered during procedure: Ancef 2 g intravenously Lidocaine 1% 30 mL locally,  Fentanyl 75 mcg intravenously Versed 4 mg intravenously  Device details:  Generator Medtronic Protecta [id number] serial number [id number]  Right atrial lead Medtronic 5076-52 cm serial number [id number]  Right ventricular lead Medtronic Sprint Quattro secure 6935M - 62 cm serial number [id number] (single coil IS4)   Procedure details:  After the risks and benefits of the procedure were discussed the patient provided informed consent and was brought to the cardiac cath lab in the fasting state. The patient was prepped and draped in usual sterile fashion. Local anesthesia with 1% lidocaine was administered to to the left infraclavicular area. The previous incision was opened by removing the previously placed sutures. The device was removed from the pocket.The pocket was carefully inspected for hemostasis. An antibiotic-soaked sponge was placed in the pocket.  Previously placed ventricular lead was disconnected from the defibrillator generator. Repeat testing showed no capture at high output and very poor ventricular sensing. The helix was retracted and the lead was pulled back and repositioned. An attempt was made to place a lead and a new location but the ventricular sensing parameters were  highly variable and the decision was made to place an entirely new lead.   Under fluoroscopic guidance a new venipuncture was performed without difficulty and a J-tipped Guidewire was placed and subsequently exchanged for a 9.5 French introducer sheath, using a modified Seldinger technique.  Under fluoroscopic guidance the ventricular lead was advanced to level of the mid to apical right ventricular septum and thet active-fixation helix was deployed. Prominent current of injury was seen. Satisfactory pacing and sensing parameters were recorded. There was no evidence of diaphragmatic stimulation at maximum device output. The safe sheath was peeled away and the lead was secured in place with 2-0 silk. In this initial location the sensing was at first excellent but then again became highly variable and the R-wave voltage decreased to the point was felt to be unsatisfactory. The helix was retracted and the lead was placed in a different position. In this location the was again excellent current of injury better and more stable ventricular sensing and a very low pacing threshold. Pacing at maximum device output (10 V impresses did not produce diaphragmatic stimulation.  The antibiotic-soaked sponge was removed from the pocket. The pocket was flushed with copious amounts of antibiotic solution. Reinspection showed excellent hemostasis.  The ventricular lead was connected to the generator and appropriate ventricular pacing was seen. Subsequently the atrial lead was also connected. Repeat testing of the lead parameters using telemetry showed excellent values.  The entire system was then carefully inserted in the pocket with care been taking that the leads and device assumed a comfortable position without pressure on the incision. Great care was taken that the leads be located deep to the generator. The pocket was then closed in layers using 2 layers of 2-0 Vicryl and cutaneous staples, after which a sterile  dressing was applied.  Defibrillation threshold testing was not performed at this time due to sedation concerns.  At the end of the procedure the following lead parameters were encountered:   Via the programmer Right ventricular lead sensed R waves 6.9-11.2 mV, impedance 503ohms, threshold 0.3 V at 0.5 ms pulse width.  Via the device on telemetry Right ventricular lead sensed R waves 7.1-10.9 mV, impedance 532ohms, threshold 0.75V at 0.4 ms pulse width.  High voltage impedance 67 ohms.  Sanda Klein, MD, [place] [phone] office [phone] pager 02/10/2012 5:57 PM

## 2012-02-10 NOTE — Progress Notes (Signed)
Subjective: Anxious concerning pace, some diaphragmatic pain.   Objective: Vital signs in last 24 hours: Temp:  [97.4 F (36.3 C)-98.6 F (37 C)] 97.7 F (36.5 C) (06/18 0811) Pulse Rate:  [76-99] 88  (06/18 0811) Resp:  [15-25] 18  (06/18 0811) BP: (100-137)/(63-93) 135/87 mmHg (06/18 0811) SpO2:  [94 %-99 %] 99 % (06/18 0811) Weight:  [95.1 kg (209 lb 10.5 oz)] 95.1 kg (209 lb 10.5 oz) (06/18 0049) Weight change:  Last BM Date: 02/09/12 Intake/Output from previous day: -510 06/17 0701 - 06/18 0700 In: 240 [P.O.:240] Out: 750 [Urine:750] Intake/Output this shift:    PE: General: Heart: Lungs: Abd: Ext:    Lab Results: No results found for this basename: WBC:2,HGB:2,HCT:2,PLT:2 in the last 72 hours BMET No results found for this basename: NA:2,K:2,CL:2,CO2:2,GLUCOSE:2,BUN:2,CREATININE:2,CALCIUM:2,MAGNESIUM:2 in the last 72 hours No results found for this basename: TROPONINI:2,CK,MB:2 in the last 72 hours  Lab Results  Component Value Date   CHOL 126 07/28/2011   HDL 34* 07/28/2011   LDLCALC 54 07/28/2011   TRIG 189* 07/28/2011   CHOLHDL 3.7 07/28/2011   Lab Results  Component Value Date   HGBA1C 5.2 07/28/2011     Lab Results  Component Value Date   TSH 3.184 06/21/2011    Hepatic Function Panel No results found for this basename: PROT,ALBUMIN,AST,ALT,ALKPHOS,BILITOT,BILIDIR,IBILI in the last 72 hours No results found for this basename: CHOL in the last 72 hours No results found for this basename: PROTIME in the last 72 hours    EKG: Orders placed during the hospital encounter of 02/09/12  . EKG 12-LEAD  . EKG 12-LEAD  . EKG 12-LEAD  . EKG 12-LEAD    Studies/Results: Dg Chest 2 View  02/10/2012  *RADIOLOGY REPORT*  Clinical Data: 77 year old male status post ICD implant.  CHEST - 2 VIEW  Comparison: 08/23/2011 and earlier.  Findings: Left chest dual lead transvenous cardiac pacemaker or AICD is in place.  Overlying skin staples.  No pneumothorax.  Lead  positioning appears appropriate.  Stable lung volumes.  Stable cardiac size and mediastinal contours.  No pulmonary edema, pleural effusion or acute pulmonary opacity.  The No acute osseous abnormality identified.  IMPRESSION: Left chest dual lead cardiac AICD placed with no adverse features.  Original Report Authenticated By: Randall An, M.D.    Medications: I have reviewed the patient's current medications.    Marland Kitchen aspirin  81 mg Oral Daily  . atorvastatin  40 mg Oral QHS  .  ceFAZolin (ANCEF) IV  1 g Intravenous Q6H  . fentaNYL      . fluticasone  2 puff Inhalation BID  . heparin      . ipratropium  500 mcg Nebulization QID  . irbesartan  75 mg Oral Daily  . levalbuterol  0.63 mg Nebulization QID  . lidocaine      . lidocaine      . midazolam      . midazolam      . midazolam      . montelukast  10 mg Oral Daily  . pantoprazole  40 mg Oral Q1200  . terazosin  2 mg Oral QHS  . you have a pacemaker book   Does not apply Once  . DISCONTD:  ceFAZolin (ANCEF) IV  2 g Intravenous On Call  . DISCONTD: chlorhexidine  60 mL Topical Once  . DISCONTD: gentamicin irrigation  80 mg Irrigation On Call  . DISCONTD: mupirocin ointment   Nasal BID  . DISCONTD: sodium chloride  3  mL Intravenous Q12H    Assessment/Plan: Patient Active Problem List  Diagnosis  . DYSLIPIDEMIA  . HYPERTENSION  . CAD  . CARDIOMYOPATHY, ISCHEMIC  . Acute blood loss anemia  . Recent Anterior ST elevation (STEMI) myocardial infarction involving LAD  . Chronic systolic congestive heart failure  . CKD (chronic kidney disease) stage 3, GFR 30-59 ml/min  . COPD (chronic obstructive pulmonary disease)  . Prostate cancer  . Left Ventricular thrombus s/p Anterior STEMI  . Right side Groin Hematoma following emergent percutaneous transluminal coronary angioplasty.   S/P evacuation of hematoma right thigh. Repair of right superficial femoral artery.   . Anticoagulated on warfarin  . STEMI (ST elevation myocardial  infarction)  . Atherosclerosis of native arteries of the extremities with intermittent claudication  . Hematoma complicating a procedure  . S/P ICD (internal cardiac defibrillator) procedure, 6.17.13, medtronic devise.   PLAN: Discussed with Dr. Gwenlyn Found and Dr. Sallyanne Kuster, pt. Has micro lead dislodgement and needs lead revision.  Have made NPO.  LOS: 1 day   INGOLD,LAURA R 02/10/2012, 8:29 AM   Agree with note written by Cecilie Kicks RNP  S/P ICD placement by Dr. Loletha Grayer yesterday for primary prevention in pt with ISCM of Dr. Alphonzo Dublin. He apparently had micro dislodgement by ICD interrogation by rep this AM. CXR ok. For lead repositioning this PM.  Lorretta Harp 02/10/2012 8:40 AM

## 2012-02-10 NOTE — Plan of Care (Signed)
Problem: Consults Goal: Permanent Pacemaker Patient Education (See Patient Education module for education specifics.) Outcome: Completed/Met Date Met:  02/10/12 Video viewed and pacemaker booklet given  Problem: Phase III Progression Outcomes Goal: Pain controlled on oral analgesia Outcome: Completed/Met Date Met:  02/10/12 Tylenol resolved discomfort in pacemaker site left upper chest, patient sleeping after medicated Goal: Tolerating diet Outcome: Completed/Met Date Met:  02/10/12 Tolerated dinner Goal: Hemodynamically stable Outcome: Completed/Met Date Met:  02/10/12 VSS Goal: Limited arm movement per orders Outcome: Completed/Met Date Met:  02/10/12 Patient compliant with leaving arm still and not reaching or using left arm Goal: Discharge plan remains appropriate-arrangements made Outcome: Completed/Met Date Met:  02/10/12 Plan to discharge home today

## 2012-02-11 ENCOUNTER — Inpatient Hospital Stay (HOSPITAL_COMMUNITY): Payer: Medicare Other

## 2012-02-11 MED ORDER — BIOTENE DRY MOUTH MT LIQD
15.0000 mL | Freq: Two times a day (BID) | OROMUCOSAL | Status: DC
  Administered 2012-02-11: 15 mL via OROMUCOSAL

## 2012-02-11 MED ORDER — TICAGRELOR 90 MG PO TABS: 45.0000 mg | ORAL_TABLET | Freq: Two times a day (BID) | ORAL | Status: DC

## 2012-02-11 MED ORDER — YOU HAVE A PACEMAKER BOOK
Freq: Once | Status: AC
  Administered 2012-02-11: 11:00:00
  Filled 2012-02-11: qty 1

## 2012-02-11 MED ORDER — YOU HAVE A PACEMAKER BOOK: 1.0000 | Freq: Once | Status: DC

## 2012-02-11 NOTE — Progress Notes (Signed)
Pt returned from xray agitated that XR tech removed his sling and bumped his arm.  Sling and abductor pillow was in place upon return to room.  Pt has made numerous remarks about people "not doing things right". Pt calmed easily after assurance from RN that it was necessary to remove sling for xray and MD is aware this occurs.  Pt upset that he had to lift arm for xray.  Rad tech Margaretha Sheffield states arm was not lifted higher than chest high as usually done for post pacemaker xray.  Medicated for LUC pain with vicodin with good results.  Calm and cooperative at this time.

## 2012-02-11 NOTE — Progress Notes (Signed)
Subjective:  No complaints  Objective:  Vital Signs in the last 24 hours: Temp:  [97.3 F (36.3 C)-98 F (36.7 C)] 97.3 F (36.3 C) (06/19 0037) Pulse Rate:  [94-100] 96  (06/19 0037) Resp:  [18-24] 23  (06/19 0037) BP: (50-125)/(12-80) 125/77 mmHg (06/19 0037) SpO2:  [94 %-98 %] 95 % (06/19 0804) Weight:  [93.3 kg (205 lb 11 oz)] 93.3 kg (205 lb 11 oz) (06/19 0037)  Intake/Output from previous day:  Intake/Output Summary (Last 24 hours) at 02/11/12 0909 Last data filed at 02/11/12 0043  Gross per 24 hour  Intake 769.17 ml  Output    275 ml  Net 494.17 ml    Physical Exam: General appearance: alert, cooperative and no distress Lungs: clear to auscultation bilaterally Heart: regular rate and rhythm Pacer site without hematoma Abd soft; BS + No edema   Rate: 96  Rhythm: normal sinus rhythm  Lab Results:  Basename 02/10/12 1034  WBC 9.6  HGB 11.9*  PLT 168    Basename 02/10/12 1034  NA 140  K 4.2  CL 106  CO2 23  GLUCOSE 117*  BUN 22  CREATININE 1.47*   No results found for this basename: TROPONINI:2,CK,MB:2 in the last 72 hours Hepatic Function Panel No results found for this basename: PROT,ALBUMIN,AST,ALT,ALKPHOS,BILITOT,BILIDIR,IBILI in the last 72 hours No results found for this basename: CHOL in the last 72 hours  Basename 02/09/12 0712  INR 1.09    Imaging: Imaging results have been reviewed  Cardiac Studies:  Assessment/Plan:   Principal Problem:  *S/P MDT ICD  6.17.13, with revision 02/10/12 for lead dislodgement Active Problems:  CAD  CARDIOMYOPATHY, ISCHEMIC  Recent Anterior ST elevation (STEMI) myocardial infarction involving LAD, 05/2011  COPD (chronic obstructive pulmonary disease)  DYSLIPIDEMIA  HYPERTENSION  Chronic systolic congestive heart failure  CKD (chronic kidney disease) stage 3, GFR 30-59 ml/min  Prostate cancer  Left Ventricular thrombus s/p Anterior STEMI Oct 2012, Rx'd with Coumadin  Anticoagulated on warfarin  Atherosclerosis of native arteries of the extremities with intermittent claudication   Plan- Home today. ? Resume Brilinta and Coumadin.   Kerin Ransom PA-C 02/11/2012, 9:09 AM   Patient seen and examined. Agree with assessment and plan. No chest pain or SOB. MI was in Oct 2012 at which time he was found to have apical thrombus for which coumadin was started. Repeat PCI in Dec 2012.  Pt has had GI bleed this year.  Thrombus should now be well stabilized. Will therefore not resume coumadin but restart ASA 81 mg and Brillinta 90 mg bid.  Plan DC later today. Cardiology f/u with me and ICD f/u with Dr. Sallyanne Kuster.   Troy Sine, MD, [facility] 02/11/2012 9:12 AM

## 2012-02-11 NOTE — Discharge Summary (Signed)
Patient ID: [name],  MRN: [id number], DOB/AGE: [birth year] 77 y.o.  Admit date: 02/09/2012 Discharge date: 02/11/2012  Primary Care Provider:  Primary Cardiologist: Dr [name]  Discharge Diagnoses Principal Problem:  *S/P MDT ICD  6.17.13, with revision 02/10/12 for lead dislodgement Active Problems:  CAD  CARDIOMYOPATHY, ISCHEMIC  Recent Anterior ST elevation (STEMI) myocardial infarction involving LAD, 05/2011  COPD (chronic obstructive pulmonary disease)  DYSLIPIDEMIA  HYPERTENSION  Chronic systolic congestive heart failure  CKD (chronic kidney disease) stage 3, GFR 30-59 ml/min  Prostate cancer  Left Ventricular thrombus s/p Anterior STEMI Oct 2012, Rx'd with Coumadin  Anticoagulated on warfarin  Atherosclerosis of native arteries of the extremities with intermittent claudication    Procedures: ICD implant 02/09/12 with lead revision 02/10/12   Hospital Course: 77 y/o with a history of severe ischemic cardiomyopathy with an EF of 25% by echo March 2013. He has CAD and had an Ant MI Oct 2012. At that time he received a DES to the LAD for ISR. He had an LVT by 2D then and was put on Coumadin at that time. He is admitted now for elective ICD implant. This was done 02/09/12. He had lead dislodgement and had to go back for lead revision 02/10/12. He is stable for discharge 02/11/12. His pacer site is ecchymotic and there is a small amount of swelling. Dr [name] saw him the morning of discharge and feels we can stop Coumadin. We'll hold his Brilinta for one more day. He'll follow up in the office in one week for a site check.  Discharge Vitals:  Blood pressure 125/77, pulse 96, temperature 97.3 F (36.3 C), temperature source Oral, resp. rate 23, height 5\' 7"  (1.702 m), weight 93.3 kg (205 lb 11 oz), SpO2 95.00%.    Labs: Results for orders placed during the hospital encounter of 02/09/12 (from the past 48 hour(s))  BASIC METABOLIC PANEL     Status: Abnormal   Collection Time   02/10/12 10:34 AM      Component Value Range Comment   Sodium 140  135 - 145 mEq/L    Potassium 4.2  3.5 - 5.1 mEq/L    Chloride 106  96 - 112 mEq/L    CO2 23  19 - 32 mEq/L    Glucose, Bld 117 (*) 70 - 99 mg/dL    BUN 22  6 - 23 mg/dL    Creatinine, Ser 1.47 (*) 0.50 - 1.35 mg/dL    Calcium 8.5  8.4 - 10.5 mg/dL    GFR calc non Af Amer 45 (*) >90 mL/min    GFR calc Af Amer 52 (*) >90 mL/min   CBC     Status: Abnormal   Collection Time   02/10/12 10:34 AM      Component Value Range Comment   WBC 9.6  4.0 - 10.5 K/uL    RBC 4.16 (*) 4.22 - 5.81 MIL/uL    Hemoglobin 11.9 (*) 13.0 - 17.0 g/dL    HCT 36.1 (*) 39.0 - 52.0 %    MCV 86.8  78.0 - 100.0 fL    MCH 28.6  26.0 - 34.0 pg    MCHC 33.0  30.0 - 36.0 g/dL    RDW 16.6 (*) 11.5 - 15.5 %    Platelets 168  150 - 400 K/uL     Disposition:  Follow-up Information    Follow up with Sanda Klein, MD on 02/18/2012. (2pm)    Contact information:   [address]  [place] [phone]          Discharge Medications:  Medication List  As of 02/11/2012 10:39 AM   STOP taking these medications         warfarin 7.5 MG tablet         TAKE these medications         albuterol 108 (90 BASE) MCG/ACT inhaler   Commonly known as: PROVENTIL HFA;VENTOLIN HFA   Inhale 2 puffs into the lungs every 6 (six) hours as needed.      aspirin 81 MG chewable tablet   Chew 1 tablet (81 mg total) by mouth daily.      atorvastatin 40 MG tablet   Commonly known as: LIPITOR   Take 40 mg by mouth at bedtime.      carisoprodol 350 MG tablet   Commonly known as: SOMA   Take 350 mg by mouth 3 (three) times daily as needed. For back pain      fluticasone 220 MCG/ACT inhaler   Commonly known as: FLOVENT HFA   Inhale 2 puffs into the lungs 2 (two) times daily.      HYDROcodone-acetaminophen 10-650 MG per tablet   Commonly known as: LORCET   Take 1 tablet by mouth every 6 (six) hours as needed. For pain       ipratropium 0.02 % nebulizer solution   Commonly known as: ATROVENT   Take 500 mcg by nebulization 4 (four) times daily. For shortness of breath      levalbuterol 0.63 MG/3ML nebulizer solution   Commonly known as: XOPENEX   Take 1 ampule by nebulization 4 (four) times daily. For shortness of breath      montelukast 10 MG tablet   Commonly known as: SINGULAIR   Take 10 mg by mouth daily.      multivitamins ther. w/minerals Tabs   Take 0.5 tablets by mouth 2 (two) times daily.      nitroGLYCERIN 0.4 MG SL tablet   Commonly known as: NITROSTAT   Place 0.4 mg under the tongue every 5 (five) minutes as needed. For chest pain      omeprazole 20 MG capsule   Commonly known as: PRILOSEC   Take 20 mg by mouth at bedtime.      OVER THE COUNTER MEDICATION   Place 1 drop into both eyes 2 (two) times daily. Saline Eye drops      terazosin 2 MG capsule   Commonly known as: HYTRIN   Take 2 mg by mouth at bedtime.      Ticagrelor 90 MG Tabs tablet   Commonly known as: BRILINTA   Take 0.5 tablets (45 mg total) by mouth 2 (two) times daily.   Start taking on: 02/12/2012      valsartan 40 MG tablet   Commonly known as: DIOVAN   Take 80 mg by mouth 2 (two) times daily.      you have a pacemaker book Misc   1 each by Does not apply route once.             Duration of Discharge Encounter: Greater than 30 minutes including physician time.  Angelena Form PA-C 02/11/2012 10:39 AM

## 2012-02-11 NOTE — Discharge Instructions (Signed)
Pacemaker Implantation A pacemaker (pacer) is a small device that acts as a backup or takes over for the natural pacemaker of the heart. The heart has its own electrical system to regulate the beating of the heart muscles. The heart pumps best when it beats in a regular, coordinated rhythm.  A pacer consists of a small device (generator) which produces electrical signals that tell your heart to beat. The generator contains a lithium battery and a tiny computer. Wires (leads) connect the generator to the heart. The pacer is placed under the skin through a small cut. It senses every heartbeat and only fires when the heart rate falls outside certain levels. When the pacer triggers a heart beat, it is called "capture." PROBLEMS THAT MAY BE HELPED BY A PACEMAKER:  Your heart rate is sometimes too slow or irregular.   Fainting, dizziness, weakness or confusion as a result of low blood flow.   Shortness of breath.   Chest pain or angina if the heart needs more blood and oxygen.   Disturbed sleep as a result of abnormal heart rhythm.   Palpitations or the feeling that the heart is beating too fast, too hard or in an irregular way.   Weak heart muscle pumping ability.  PROCEDURE   The pacer may be placed under the skin near the collarbone, while you are under sedation. An abdominal wall location may be another option.   The leads are inserted into a vein that lies just under the collarbone, then guided into place under x-ray. The tips of the wires touch the inside of the heart. The near end of the pacer wires are connected to the generator under the skin.   For individuals with thinner chest walls, it may be possible to feel the device under the skin, and a slight bump may be seen.  HOME CARE INSTRUCTIONS   Keep the incision dry for a week after the procedure.   For about 8 weeks, avoid sudden or jerky movements that pull your arm away from your body. This could change the position of the leads.     Take medicine exactly as directed.   Learn how to check your pulse. Follow directions about when to call or be concerned.   Be physically active every day. Ask your caregiver how and when to increase activity.   Household appliances do not interfere with pacemakers.   Travel by car, train or airplane should not be a problem. Carry a pacemaker ID card in case the device sets off a metal detector.  PACEMAKER CARE:  Avoid putting pressure over the area where the pacer was put in.   Digital cell phones should be kept 12 inches away from the pacemaker. Hold them at the ear on the side opposite of the pacer.   Never leave a cell phone in a pocket over the pacemaker.   Avoid strong electro-magnetic fields. You will not be able to have an MRI scan because of the strong magnets.   Pacer batteries last about 5 years and give off warning signals when they are running low on power. Pacers may be checked every 3 months. This allows plenty of time to change the generator when it is running low on power.   Changing the battery means removing the old generator through the same cut and plugging the existing wires into the new generator.   An EKG or heart monitor is used to see if your pacer is working properly. Sometimes signals may be sent  over a land line phone to your clinic.   Tell emergency responders that you have a pacemaker.  SEEK MEDICAL CARE IF:  You begin to gain weight and your feet and ankles swell.   You have dizzy spells or feel weak.   Your pulse rate drops below the limit or is too fast.  SEEK IMMEDIATE MEDICAL CARE IF:  You faint or pass out.   You have chest pain or shortness of breath.   You are injured and think your pacemaker may have been damaged.   You are suddenly very tired or have pain in your back.   You are worried that your heart is not beating right or cannot feel your pulse.  Document Released: 08/01/2002 Document Revised: 07/31/2011 Document Reviewed:  10/08/2007 [facility] Patient Information 2012 [place].

## 2012-03-29 ENCOUNTER — Ambulatory Visit (INDEPENDENT_AMBULATORY_CARE_PROVIDER_SITE_OTHER): Payer: Medicare Other | Admitting: Pulmonary Disease

## 2012-03-29 ENCOUNTER — Encounter: Payer: Self-pay | Admitting: Pulmonary Disease

## 2012-03-29 VITALS — BP 132/86 | HR 118 | Temp 98.4°F | Ht 67.0 in | Wt 206.0 lb

## 2012-03-29 DIAGNOSIS — J209 Acute bronchitis, unspecified: Secondary | ICD-10-CM | POA: Insufficient documentation

## 2012-03-29 DIAGNOSIS — J449 Chronic obstructive pulmonary disease, unspecified: Secondary | ICD-10-CM

## 2012-03-29 MED ORDER — LEVOFLOXACIN 750 MG PO TABS: 750.0000 mg | ORAL_TABLET | Freq: Every day | ORAL | Status: AC

## 2012-03-29 MED ORDER — PREDNISONE 10 MG PO TABS: ORAL_TABLET | ORAL | Status: AC

## 2012-03-29 NOTE — Progress Notes (Signed)
  Subjective:    Patient ID: [name], male    DOB: 02-Aug-1935, 77 y.o.   MRN: [id number]  HPI The patient comes in today for followup of his severe obstructive lung disease.  He also has underlying cardiomyopathy.  He feels that his breathing is worse than his normal baseline, and recently began to have chest congestion with cough productive of thick purulent mucus.  He feels that he is developing an acute bronchitis.  He has not had any chest discomfort or hemoptysis.   Review of Systems  Constitutional: Negative for fever and unexpected weight change.  HENT: Positive for sore throat. Negative for ear pain, nosebleeds, congestion, rhinorrhea, sneezing, trouble swallowing, dental problem, postnasal drip and sinus pressure.   Eyes: Negative for redness and itching.  Respiratory: Positive for shortness of breath and wheezing. Negative for cough and chest tightness.   Cardiovascular: Positive for leg swelling. Negative for palpitations.  Gastrointestinal: Negative for nausea and vomiting.  Genitourinary: Negative for dysuria.  Musculoskeletal: Negative for joint swelling.  Skin: Negative for rash.  Neurological: Negative for headaches.  Hematological: Does not bruise/bleed easily.  Psychiatric/Behavioral: Negative for dysphoric mood. The patient is not nervous/anxious.   All other systems reviewed and are negative.       Objective:   Physical Exam Overweight male in no acute distress Nose without purulence or discharge noted Neck without thyromegaly or lymphadenopathy Chest with decreased breath sounds bilaterally, no wheezing, mild basilar crackles. Cardiac exam sounds are regular but distant, no murmur Lower extremities with trace edema, no cyanosis Alert and oriented, moves all 4 extremities.       Assessment & Plan:

## 2012-03-29 NOTE — Assessment & Plan Note (Signed)
The patient is developing increased shortness of breath, chest congestion, as well as purulent mucus.  He will need treatment with a short course of antibiotics for his developing infection.  I have also recommended a mucolytic.

## 2012-03-29 NOTE — Assessment & Plan Note (Signed)
The patient had been doing fairly well on his current regimen, however most recently has had increased shortness of breath.  This is most likely because of his developing acute bronchitis and mild exacerbation.  Will also treat with a short course of prednisone in addition to his antibiotics.  I will make no changes in his maintenance medication, but if he does not return to baseline, would consider a trial of tudorza since he had an allergic reaction to Spiriva.

## 2012-03-29 NOTE — Patient Instructions (Addendum)
Will treat with levaquin 750mg  one a day for 5 days for your bronchitis. Take mucinex one in am and pm with large glass of water for the next one week. Will treat with a short course of prednisone to get you thru this episode. No change in your maintenance medications, but call if you are not getting back to your prior baseline.  followup with me in 16mos.

## 2012-04-30 IMAGING — CT CT ABD-PELV W/O CM
1 of 3 series · 8 of 32 positions shown, 13 images · non-contrast
Comparison: 06/29/2011

CLINICAL DATA: Right groin hematoma post catheterization
06/21/2011, recurrent swelling and pain today

CT ABDOMEN AND PELVIS WITHOUT CONTRAST
TECHNIQUE: Multidetector CT imaging of the abdomen and pelvis was
performed following the standard protocol without intravenous
contrast. Sagittal and coronal MPR images reconstructed from axial
data set.

[Series 2: routine abdomen · axial · 0.80mm/px · z∈[-550,-135]mm · 8 of 108 slices shown, 13 images]
[im 12/108  soft-tissue]
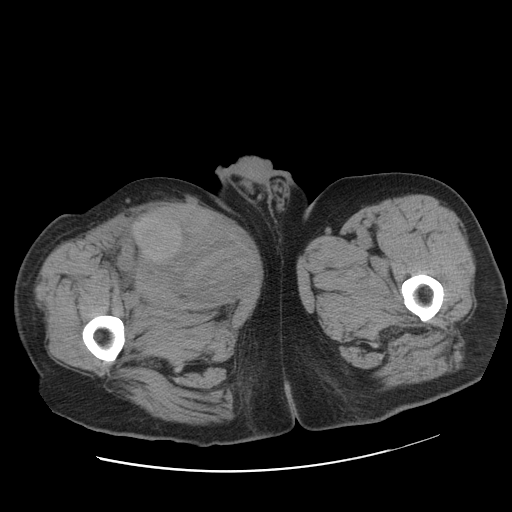
[im 12/108  bone]
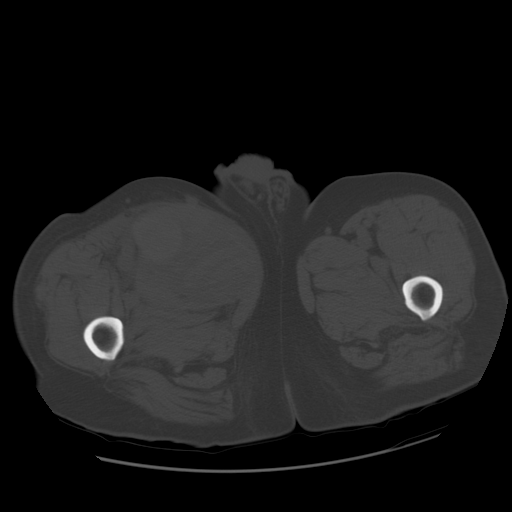
[im 24/108  soft-tissue]
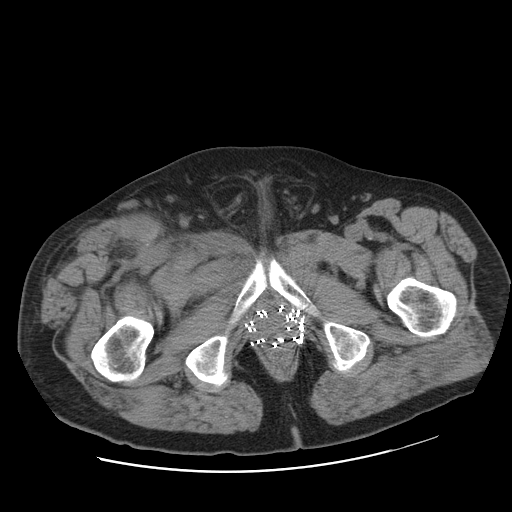
[im 36/108  soft-tissue]
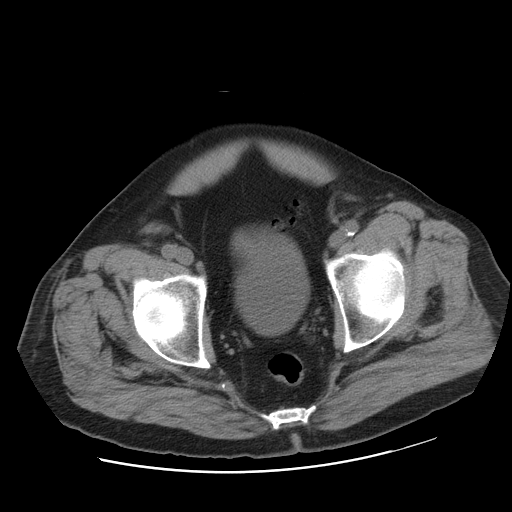
[im 48/108  soft-tissue]
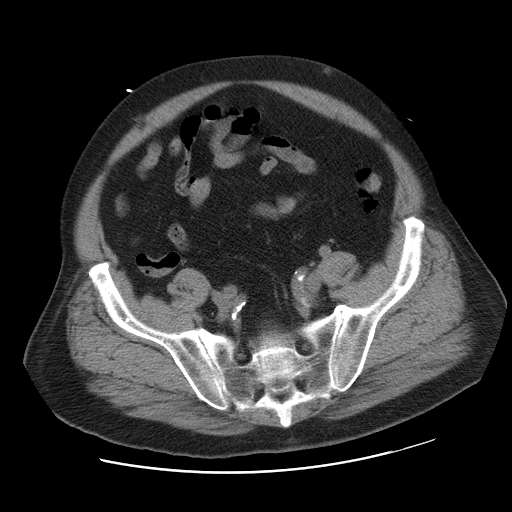
[im 60/108  soft-tissue]
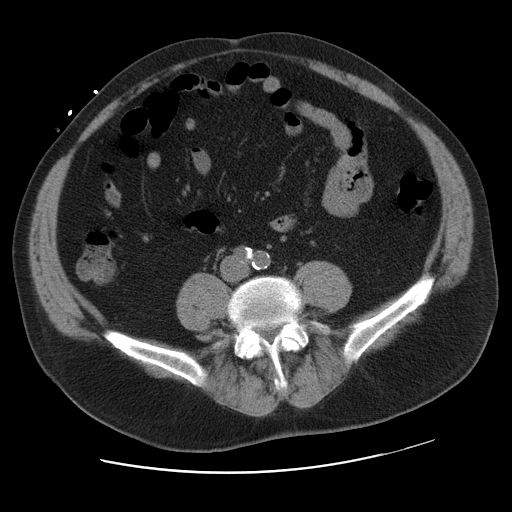
[im 60/108  lung]
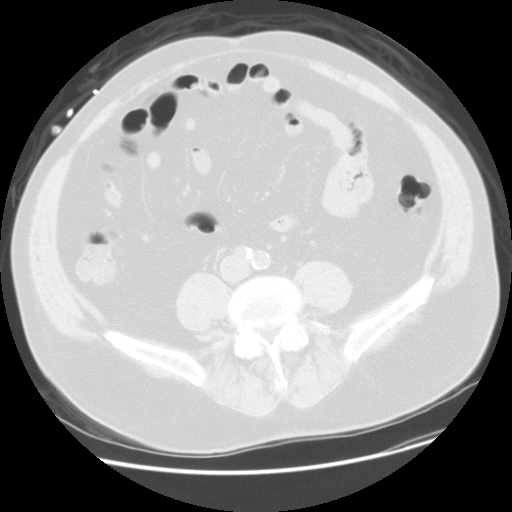
[im 72/108  soft-tissue]
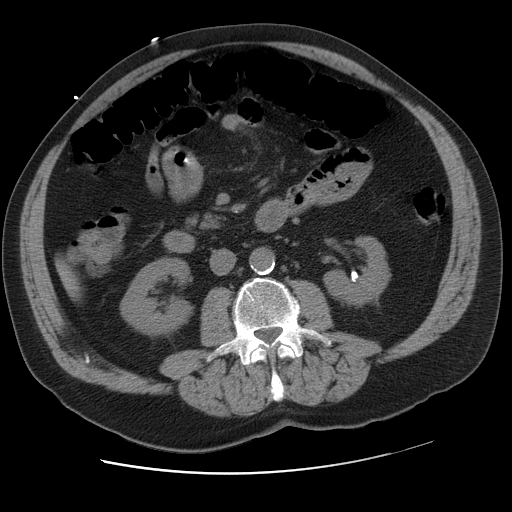
[im 72/108  lung]
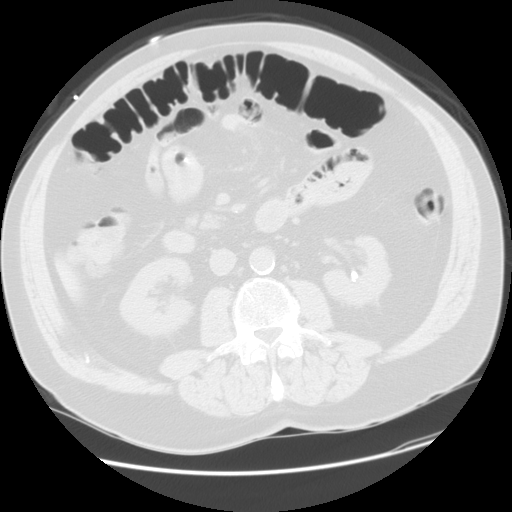
[im 84/108  soft-tissue]
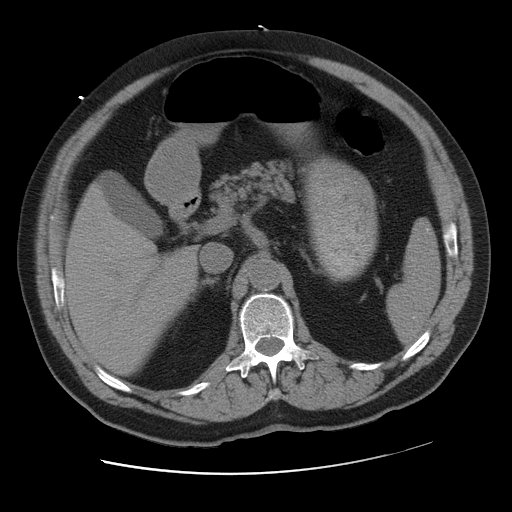
[im 84/108  lung]
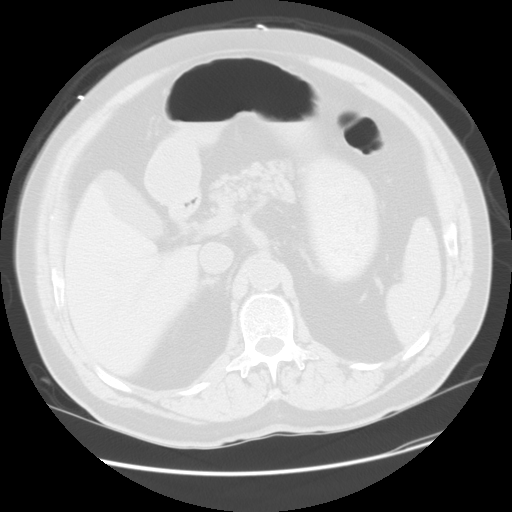
[im 96/108  soft-tissue]
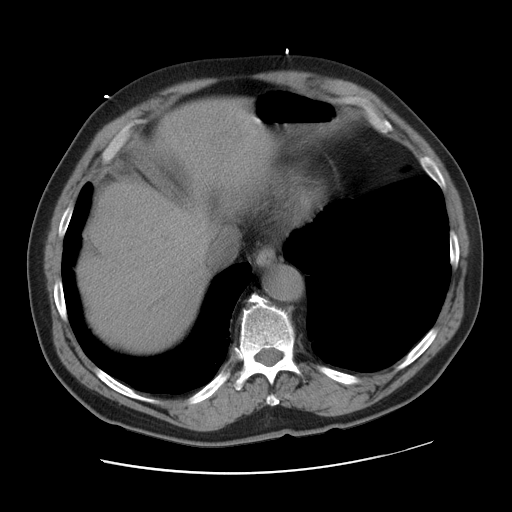
[im 96/108  lung]
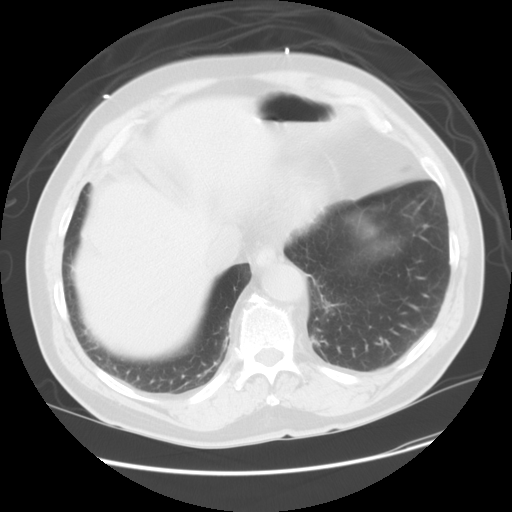

[8 of 32 positions shown; findings below may reference images not displayed]

FINDINGS: Dependent atelectasis at lung bases, greater on right.
Within limits of a nonenhanced exam, no focal abnormalities of the
liver, spleen, pancreas, or adrenal glands. Left renal calculi,
largest 8 mm diameter and lower pole.
No hydronephrosis or ureteral dilatation.
Extensive atherosclerotic calcifications without aneurysm.
Extensive diverticulosis of descending and sigmoid colon without
diverticulitis.
Stomach and bowel loops otherwise unremarkable for technique.
Normal appendix.
No intrapelvic mass or adenopathy.
Seed implants at prostate bed.

Large right groin hematoma extending into the upper right thigh,
measuring 11.3 x 9.0 cm in greatest axial dimensions image 97 and
extending in excess of 13 cm length.
This contains higher attenuation acute blood components and has
increased in size since the previous exam when it measured 7.8 cm
greatest size.
No intrapelvic extension.
No hernia or acute bony lesion.
IMPRESSION: Interval increase in size of right groin hematoma since previous
study of 06/29/2011, now measuring 11.3 x 9.0 x >13 cm in size,
extend into proximal right thigh.
Nonobstructing left renal calculi.
Distal colonic diverticulosis.
No acute intra abdominal or intrapelvic process.

## 2012-07-29 ENCOUNTER — Ambulatory Visit: Payer: Medicare Other | Admitting: Pulmonary Disease

## 2014-08-02 ENCOUNTER — Encounter (HOSPITAL_COMMUNITY): Payer: Self-pay | Admitting: Cardiology

## 2014-08-03 ENCOUNTER — Encounter (HOSPITAL_COMMUNITY): Payer: Self-pay | Admitting: Cardiovascular Disease

## 2014-08-30 DIAGNOSIS — Z7982 Long term (current) use of aspirin: Secondary | ICD-10-CM | POA: Diagnosis not present

## 2014-08-30 DIAGNOSIS — N35011 Post-traumatic bulbous urethral stricture: Secondary | ICD-10-CM | POA: Diagnosis not present

## 2014-08-30 DIAGNOSIS — N3946 Mixed incontinence: Secondary | ICD-10-CM | POA: Diagnosis not present

## 2014-08-30 DIAGNOSIS — C61 Malignant neoplasm of prostate: Secondary | ICD-10-CM | POA: Diagnosis not present

## 2014-08-30 DIAGNOSIS — N398 Other specified disorders of urinary system: Secondary | ICD-10-CM | POA: Diagnosis not present

## 2014-11-23 ENCOUNTER — Ambulatory Visit (INDEPENDENT_AMBULATORY_CARE_PROVIDER_SITE_OTHER): Payer: Medicare Other | Admitting: Pulmonary Disease

## 2014-11-23 ENCOUNTER — Encounter: Payer: Self-pay | Admitting: Pulmonary Disease

## 2014-11-23 ENCOUNTER — Ambulatory Visit (INDEPENDENT_AMBULATORY_CARE_PROVIDER_SITE_OTHER)
Admission: RE | Admit: 2014-11-23 | Discharge: 2014-11-23 | Disposition: A | Discharge status: Home / Self Care | Payer: Medicare Other | Source: Ambulatory Visit | Attending: Pulmonary Disease | Admitting: Pulmonary Disease

## 2014-11-23 VITALS — BP 130/74 | HR 75 | Temp 97.0°F | Ht 67.0 in | Wt 205.0 lb

## 2014-11-23 DIAGNOSIS — J438 Other emphysema: Secondary | ICD-10-CM | POA: Diagnosis not present

## 2014-11-23 DIAGNOSIS — J9611 Chronic respiratory failure with hypoxia: Secondary | ICD-10-CM

## 2014-11-23 DIAGNOSIS — J9610 Chronic respiratory failure, unspecified whether with hypoxia or hypercapnia: Secondary | ICD-10-CM | POA: Insufficient documentation

## 2014-11-23 DIAGNOSIS — R0602 Shortness of breath: Secondary | ICD-10-CM | POA: Diagnosis not present

## 2014-11-23 NOTE — Patient Instructions (Signed)
Will try tudorza one inhalation am and pm everyday in addition to your current meds.  Check and see if covered by the [employer] system.  Will see if you qualify for a portable oxygen concentrator. Will check chest xray today and call you with results. Give me some feedback in 4 weeks whether you think the tudorza is helping.

## 2014-11-23 NOTE — Assessment & Plan Note (Addendum)
The patient has a history of severe COPD with chronic respiratory failure, and currently has been having slowly progressive dyspnea over the last one year. It is very difficult to sort out how much of this is related to his chronic lung disease, his chronic congestive heart failure, and finally his obesity and deconditioning. He is on a fairly good bronchodilator regimen, but does not have an anti-cholinergic in place. He has had a rash to Spiriva in the past, but I would like to try him on tudorza to see if it helps his breathing. It is unclear whether this is on the formulary at the [facility]. I would also like to check a chest x-ray since he has not had one in quite some time. The patient tells me that he watches his fluid balance very carefully with daily weights, and that he is near his dry weight currently.  I have offered to refer him back to pulmonary rehabilitation, but he does not feel that it was helpful enough to justify the cost the last go around. He will try to work on his conditioning at home on his own.

## 2014-11-23 NOTE — Progress Notes (Signed)
   Subjective:    Patient ID: [name], male    DOB: 09-11-1934, 80 y.o.   MRN: [id number]  HPI The patient comes in today for follow-up of his known severe COPD with chronic respiratory failure. He also has a known severe cardiomyopathy and has had a defibrillator placed. I have not seen him in almost 3 years, and he has been getting his care from the [facility]. He comes in today where he has had progressive shortness of breath over the last one year, and just wanted to see if there was anything different that may help him. He has been started on Xolair at the [place] for a possible asthmatic component, and he tells me that he thinks it has helped him. He currently denies any chest congestion or cough above his usual baseline. He has been weighing daily, and keeping close track of his fluid balance.   Review of Systems  Constitutional: Negative for fever and unexpected weight change.  HENT: Positive for congestion. Negative for dental problem, ear pain, nosebleeds, postnasal drip, rhinorrhea, sinus pressure, sneezing, sore throat and trouble swallowing.   Eyes: Negative for redness and itching.  Respiratory: Positive for cough, shortness of breath and wheezing. Negative for chest tightness.   Cardiovascular: Positive for leg swelling. Negative for palpitations.  Gastrointestinal: Negative for nausea and vomiting.  Genitourinary: Negative for dysuria.  Musculoskeletal: Negative for joint swelling.  Skin: Negative for rash.  Neurological: Negative for headaches.  Hematological: Does not bruise/bleed easily.  Psychiatric/Behavioral: Negative for dysphoric mood. The patient is not nervous/anxious.        Objective:   Physical Exam Obese male in no acute distress Nose without purulence or discharge noted Neck without lymphadenopathy or thyromegaly Chest with decreased breath sounds diffusely, no wheezing, a few basilar crackles. Cardiac exam with distant heart sounds, but sounds  regular Lower extremities with 1+ edema, no cyanosis Alert and oriented, moves all 4 extremities.       Assessment & Plan:

## 2014-11-27 ENCOUNTER — Telehealth: Payer: Self-pay | Admitting: Pulmonary Disease

## 2014-11-27 NOTE — Telephone Encounter (Signed)
Called and spoke to pt. Pt stated he has pre-existing urinary rentention and thinks it is partially related to the Xopenex, pt stated [facility] is aware of this. Pt started the [place] on 4/1 and the urinary retention worsened. Pt stated he was only able to void very little amounts throughout the day. Once pt stopped using the [place] the retention improved. Pt currently not taking the [place] but is still taking the Xopenex.   [place] please advise.

## 2014-11-27 NOTE — Telephone Encounter (Signed)
Let him know that tudorza can cause urinary retention issues in some pts, but it is not common.  I agree with staying off this.  Xopenex does not cause the same issue, would have him stay on that medication as directed.

## 2014-11-27 NOTE — Telephone Encounter (Signed)
Spoke with the pt and notified of recs per [facility]  He verbalized understanding  Nothing further needed

## 2014-12-07 ENCOUNTER — Other Ambulatory Visit: Payer: Self-pay | Admitting: *Deleted

## 2014-12-07 ENCOUNTER — Encounter: Payer: Self-pay | Admitting: *Deleted

## 2014-12-07 NOTE — Patient Outreach (Signed)
[facility]) Care Management   12/07/2014  [name] 11/08/1934 [id number]  [name] is an 80 y.o. male  Subjective:  "My breathing is just getting worse and worse all the time. I'm not doing good at all."   Objective:  [facility] Care Management is following [name] for assistance with management of his severe COPD with chronic respiratory failure. He also has known severe cardiomyopathy and has an implanted defibrillator. He was receiving pulmonary care from the [facility] until recently when he asked for assistance in transitioning his pulmonary care to Dr. Danton [name] of [facility]. He has had progressive shortness of breath over the last year and has sought help for improvement in management of COPD, particularly the asthmatic component.   [name] reports significantly worsening shortness of breath at rest over the last 6 months and in particular in the last month since pollen levels are high.   In addition, [name] has struggled with urinary retention and is followed by urology at [facility] (Dr. Lawerance [name]).    BP 104/76 mmHg  Pulse 85  SpO2 97%   Review of Systems  Constitutional: Negative.   HENT: Negative.   Eyes: Negative.   Respiratory: Positive for shortness of breath.        Reports worsening shortness of breath; patient questions pollen as source; found mold in home months ago, mold removal completed late 2015  Cardiovascular: Negative.   Gastrointestinal: Negative.   Genitourinary: Positive for flank pain.  Musculoskeletal: Negative.   Skin: Negative.   Neurological: Negative.   Endo/Heme/Allergies: Negative.   Psychiatric/Behavioral: Negative.     Physical Exam  Constitutional: He is oriented to person, place, and time. Vital signs are normal. He appears well-developed and well-nourished. He is active.  Cardiovascular: Normal rate, regular rhythm and normal heart sounds.   Respiratory: Effort normal. He has decreased breath  sounds in the right upper field, the right middle field, the right lower field, the left upper field, the left middle field and the left lower field.  GI: Soft. Bowel sounds are normal.  Neurological: He is alert and oriented to person, place, and time.  Skin: Skin is warm, dry and intact. Bruising noted.     Psychiatric: He has a normal mood and affect. His speech is normal and behavior is normal. Judgment and thought content normal. Cognition and memory are normal.  Patient coping with recent loss of son; states he has excellent immediate and extended family support.     Current Medications:   Current Outpatient Prescriptions  Medication Sig Dispense Refill  . albuterol (PROAIR HFA) 108 (90 BASE) MCG/ACT inhaler Inhale 2 puffs into the lungs every 6 (six) hours as needed. 1 Inhaler 3  . carvedilol (COREG) 6.25 MG tablet Take 6.25 mg by mouth 2 (two) times daily with a meal.    . diltiazem (CARDIZEM) 120 MG tablet Take 120 mg by mouth daily.    Marland Kitchen EPINEPHrine 0.3 mg/0.3 mL IJ SOAJ injection Inject into the muscle once.    . fluticasone (FLOVENT HFA) 220 MCG/ACT inhaler Inhale 2 puffs into the lungs 2 (two) times daily.    . furosemide (LASIX) 40 MG tablet 1 by mouth daily as needed    . levalbuterol (XOPENEX) 0.63 MG/3ML nebulizer solution Take 1 ampule by nebulization 4 (four) times daily. For shortness of breath    . montelukast (SINGULAIR) 10 MG tablet Take 10 mg by mouth daily.      . nitroGLYCERIN (  NITROSTAT) 0.4 MG SL tablet Place 0.4 mg under the tongue every 5 (five) minutes as needed. For chest pain    . omalizumab (XOLAIR) 150 MG injection Inject 150 mg into the skin every 28 (twenty-eight) days.    . pantoprazole (PROTONIX) 40 MG tablet Take 40 mg by mouth daily.    . potassium chloride (MICRO-K) 10 MEQ CR capsule Take 10 mEq by mouth.    . prasugrel (EFFIENT) 10 MG TABS tablet Take 10 mg by mouth daily.    . rosuvastatin (CRESTOR) 40 MG tablet Take 40 mg by mouth daily.    Marland Kitchen  terazosin (HYTRIN) 2 MG capsule Take 2 mg by mouth at bedtime.     . valsartan (DIOVAN) 80 MG tablet Take 80 mg by mouth daily.     No current facility-administered medications for this visit.    Functional Status:   In your present state of health, do you have any difficulty performing the following activities: 12/07/2014  Hearing? N  Vision? N  Difficulty concentrating or making decisions? N  Walking or climbing stairs? Y  Dressing or bathing? N  Doing errands, shopping? N  Preparing Food and eating ? N  Using the Toilet? N  In the past six months, have you accidently leaked urine? N  Do you have problems with loss of bowel control? N  Managing your Medications? N  Managing your Finances? N  Housekeeping or managing your Housekeeping? N    Fall/Depression Screening:    PHQ 2/9 Scores 12/07/2014  PHQ - 2 Score 1    Assessment:    Chronic Health Condition (COPD) - [name] is notably more short of breath today at rest; he says he is unable to take a shower now without using his oxygen (he uses his long cord w/ concentrator in other room);  he feels this is related to pollen/seasonal changes; he started using Mucinex today to see if this will help; I advised that he use face masks anytime he is outside during pollen season; O2 sat is 97% on 3l/[place]; he saw Dr. Gwenette [name] on 3/31; a Tudorza trial was tried but [name] has urinary retention and this worsened his symptoms; he is not taking [name] currently but is taking all other medications as prescribed  [name] awaits approval for portable concentrator - Dr. Janifer [name] office notes in his AVS that they are working on this.  [name] weighs himself daily (dry weight: 204) and says that he notices he doesn't drop as much weight after taking his Lasix as he did previously; he told me he has not been taking Lasix everyday (citing inconvenience of having to go to the bathroom frequently when he is out and "small stream"). He has  appointment with urology [name]) in July.      Chronic Health Condition (h/o bladder CA w/ Post-traumatic bulbous urethral stricture/mixed incontinence) - [name] sees Dr. Lawerance [name] @ [facility] routinely; last appointment 1/6; *PSA .01*; [name] was self-cathing 1x/week but says now he is having difficulty passing the cath; he had a recent UTI and is currently finishing oral antibiotics and states his symptoms (urinary hesitancy, frequency, & dysuria); he sees Dr. Rosana Hoes again on July 8. I encouraged him to contact Dr. Rosana Hoes' office and notify him of worsening symptoms   Plan:   Appointments: 12/08/14 - [facility] (Xolair injection; annual physical exam)  [name] will call Dr. Rosana Hoes for a urology appointment.  [name] will wear a face mask  any time he is outside during pollen season.  [name] will take Lasix daily as prescribed.   I will contact Dr. Janifer [name] office to inquire about progress on portable O2 Concentrator.    [place]        Patient Outreach from 12/07/2014 in [place] Problem One  Worsening shortness of breath at rest   Care Plan for Problem One  Active   Interventions for Problem One Long Term Goal  reviewed current developments in COPD disease process with patient   [facility]N Long Term Goal (31-90 days)  Patient will not be hospitalized for COPD related symptoms in the next 31 days   [facility]N Long Term Goal Start Date  12/07/14   [facility] CM Short Term Goal #1 (0-30 days)  patient will wear a face mask anytime he is outside during pollen season over the next 30 days [patient noted that shortness of breath greatly worsened with the arrival of spring pollen]   [facility] CM Short Term Goal #1 Start Date  12/07/14   [facility] CM Short Term Goal #2 (0-30 days)  patient will take all medications as prescribed daily for the next 30 days as evidenced by patient report and pill count   [facility] CM Short Term Goal #2 Start Date  12/07/14   [facility] CM Short Term Goal #3  (0-30 days)  patient will verbalize signs and symptoms of worsening COPD status and when to call for help over the next 30 days   [facility] CM Short Term Goal #3 Start Date  12/07/14       [place] Management  [phone]

## 2014-12-12 ENCOUNTER — Other Ambulatory Visit: Payer: Self-pay | Admitting: *Deleted

## 2014-12-12 ENCOUNTER — Telehealth: Payer: Self-pay | Admitting: Pulmonary Disease

## 2014-12-12 NOTE — Telephone Encounter (Signed)
Called Dr Rosana Hoes office to try and speak with someone regarding this patient and no one was able to help me as they did not know what was going.  I explained that there was no number put in message to return call to.  Order placed for POC eval 11/23/2014 and faxed same day to [employer].   Referral Notes     Type Date User   General 11/23/2014 12:03 PM Ronalee Belts S        Note   Faxed Order, ins card, ov note to [place] with [employer] .[place]       Type Date User   Provider Comments 11/23/2014 11:48 AM Kathee Delton        Summary   Provider Comments        Note   Please evaluate for POC at rest and with exertion.  Need flow requirement on pulsed for both situations.     Number found within an office note for [place] [facility]  [phone]   LMTCB x 1

## 2014-12-12 NOTE — Patient Outreach (Signed)
I returned a call to Dr. Janifer Adie office today and spoke with Cyril Mourning who notified me that orders for O2 concentrator were completed for [name]. I followed up with Mr. Montague to notify him of same.   [place] Management  [phone]

## 2014-12-12 NOTE — Telephone Encounter (Signed)
[name] returned call. Informed her that an order has already been placed for a POC on 3/31. [name] verbalized understanding and denied needing anything further.

## 2014-12-12 NOTE — Patient Outreach (Deleted)
I received a call from Dr. [name] Clance's office today notifying me that O2 Concentrator orders were completed. I followed up with Mr. Matthes to notify him of same.   [place] Management  [phone]

## 2014-12-18 ENCOUNTER — Telehealth: Payer: Self-pay | Admitting: Pulmonary Disease

## 2014-12-18 NOTE — Telephone Encounter (Signed)
Pt is aware of his CXR results again from March.  [facility] - can you please look into why the pt has not received his POC. Thanks.

## 2014-12-19 NOTE — Telephone Encounter (Signed)
lmtcb on cell # tried to call home# someone answers but they can not hear me i need to give them a # for [email or web address]  for his [place]

## 2014-12-19 NOTE — Telephone Encounter (Signed)
lmtcb with [email or web address]  to see what the hold up is [name]

## 2014-12-20 NOTE — Telephone Encounter (Signed)
Pt returned call. Spoke with [place] pt needs to contact [place]. Gave pt Rebecca's number to call regarding [employer].

## 2014-12-20 NOTE — Telephone Encounter (Signed)
ATC x 3 (with 336) busy signal [facility]

## 2014-12-20 NOTE — Telephone Encounter (Signed)
Pt returned call [phone]

## 2015-01-11 ENCOUNTER — Other Ambulatory Visit: Payer: Self-pay | Admitting: *Deleted

## 2015-01-11 NOTE — Patient Outreach (Signed)
[facility] [facility]) Care Management   01/11/2015  [name] [birth year] [id number]  [name] is an 80 y.o. male  Subjective: "I'm feeling really short of breath sometimes when I turn my oxygen up to 6 like I'm supposed to when I walk. I feel better when I turn it down so I haven't been putting it up to 6."  Objective:  [place] Management is following [name] for assistance with management of his severe COPD with chronic respiratory failure. He also has known severe cardiomyopathy and has an implanted defibrillator. He was receiving pulmonary care from the [facility] until recently when he asked for assistance in transitioning his pulmonary care to Dr. Danton Sewer of [facility]. He has had progressive shortness of breath over the last year and has sought help for improvement in management of COPD, particularly the asthmatic component.   [name] reports significantly worsening shortness of breath at rest over the last 6 months and in particular in the last month since pollen levels are high.   In addition, [name] has struggled with urinary retention and is followed by urology at [facility] (Dr. [name]).   BP 110/78 mmHg  Pulse 78  SpO2 97%   Review of Systems  HENT: Negative.   Eyes: Negative.   Respiratory: Positive for shortness of breath.   Cardiovascular: Negative.   Gastrointestinal: Positive for constipation.       Reports period of constipation over course of about 1 week; took OTC laxative and states he "stayed in the bathroom a whole afternoon"; reports weighing himself the following day and found to have lost 8 pounds  Genitourinary: Negative.   Musculoskeletal: Positive for myalgias and falls.       Fall 2 weeks ago from his electric wheelchair; reports he was moving about outside and got too close to a post in the ground and caught his foot on it; he was thrown from the wheelchair and landed directly on his left knee cap; has small scab; no  discoloration; full ROM  Skin: Negative.   Neurological: Positive for weakness.  Psychiatric/Behavioral:       Says "I don't think I'll be here too much longer"; denies depression; says "I'm not afraid. I can just tell it's going to be my time sometime before this year ends"; denies suicidal ideation; states "I'm happy and I'm happy I have my family around all the time."    Physical Exam  Constitutional: He is oriented to person, place, and time. He appears well-developed and well-nourished. He is active.  Cardiovascular: Normal rate.   Respiratory: He has decreased breath sounds in the right middle field, the right lower field, the left middle field and the left lower field.  Breath sounds clear but diminished throughout; baseline unchanged  GI: Soft. Bowel sounds are normal.  Neurological: He is alert and oriented to person, place, and time.  Skin: Skin is warm and dry.  Psychiatric: He has a normal mood and affect. His speech is normal and behavior is normal. Judgment and thought content normal. Cognition and memory are normal.    Current Medications:   Current Outpatient Prescriptions  Medication Sig Dispense Refill  . carvedilol (COREG) 6.25 MG tablet Take 6.25 mg by mouth 2 (two) times daily with a meal.    . diltiazem (CARDIZEM) 120 MG tablet Take 120 mg by mouth daily.    Marland Kitchen EPINEPHrine 0.3 mg/0.3 mL IJ SOAJ injection Inject into the muscle once.    Marland Kitchen  furosemide (LASIX) 40 MG tablet 1 by mouth daily as needed    . guaiFENesin (MUCINEX) 600 MG 12 hr tablet Take 600 mg by mouth 2 (two) times daily.    Marland Kitchen levalbuterol (XOPENEX) 0.63 MG/3ML nebulizer solution Take 1 ampule by nebulization 4 (four) times daily. For shortness of breath    . montelukast (SINGULAIR) 10 MG tablet Take 10 mg by mouth daily.      . nitroGLYCERIN (NITROSTAT) 0.4 MG SL tablet Place 0.4 mg under the tongue every 5 (five) minutes as needed. For chest pain    . omalizumab (XOLAIR) 150 MG injection Inject 150 mg  into the skin every 28 (twenty-eight) days.    . pantoprazole (PROTONIX) 40 MG tablet Take 40 mg by mouth daily.    . potassium chloride (MICRO-K) 10 MEQ CR capsule Take 10 mEq by mouth.    . prasugrel (EFFIENT) 10 MG TABS tablet Take 10 mg by mouth daily.    . rosuvastatin (CRESTOR) 40 MG tablet Take 40 mg by mouth daily.    Marland Kitchen terazosin (HYTRIN) 2 MG capsule Take 2 mg by mouth at bedtime.     . valsartan (DIOVAN) 80 MG tablet Take 80 mg by mouth daily.    Marland Kitchen albuterol (PROAIR HFA) 108 (90 BASE) MCG/ACT inhaler Inhale 2 puffs into the lungs every 6 (six) hours as needed. 1 Inhaler 3  . fluticasone (FLOVENT HFA) 220 MCG/ACT inhaler Inhale 2 puffs into the lungs 2 (two) times daily.     No current facility-administered medications for this visit.    Functional Status:   In your present state of health, do you have any difficulty performing the following activities: 01/11/2015 12/07/2014  Hearing? N N  Vision? N N  Difficulty concentrating or making decisions? N N  Walking or climbing stairs? N Y  Dressing or bathing? Y N  Doing errands, shopping? N N  Preparing Food and eating ? N N  Using the Toilet? N N  In the past six months, have you accidently leaked urine? N N  Do you have problems with loss of bowel control? N N  Managing your Medications? N N  Managing your Finances? N N  Housekeeping or managing your Housekeeping? N N    Fall/Depression Screening:    PHQ 2/9 Scores 01/11/2015 12/07/2014  PHQ - 2 Score 0 1    Assessment:  COPD, advanced stage - denies worsened shortness of breath or respiratory event since our last visit; doesn't like to increase O2 to 6l/[place] during ambulation as recommended by pulmonologist at the [facility], saying he felt more short of breath; has been leaving O2 @ 4-4.5l/[place] continuous; denies fever, cough, worsened DOE  [name] told me today that he didn't think he would survive the end of this year. When I asked why he said "I just have a feeling." He denied  feeling depressed or having SI and stated he was happy and felt well supported by his family and friends. I asked if he felt he needed to talk with anyone, clergy/professional counselor/provider/social worker, and he declined saying "I don't need to talk with anyone about it. It's okay. I'm not sad. I just feel it inside." I asked [name] to get in touch with me if he changed his mind and wanted me to help him get in touch with someone.   Plan:   Home visit next month for continued close surveillance of COPD and intervention as appropriate.   [facility] CM Care Plan Problem  Two        Patient Outreach Telephone from 01/11/2015 in [place] Problem Two  No established goals of care/long term plan of care   Care Plan for Problem Two  Active   Interventions for Problem Two Long Term Goal   utilizing active listening, encouraged patient to discuss his feelings/concerns relatd to end of life / goals of care   [facility] Long Term Goal (31-90) days  patient will discuss concerns about end of life with his chosen family members over the next 31 days as evidenced by his report of same on our next face to face visit   [facility] Long Term Goal Start Date  01/11/15   [facility] CM Short Term Goal #1 (0-30 days)  in the next 2 weeks, patient will get in touch with his daughter (lives out of town) to determine a time they can meet to discuss patient concerns/feelings about end of life    [facility] CM Short Term Goal #1 Start Date  01/11/15   Interventions for Short Term Goal #2   utilizing teachback method, discussed with patient the importance of expressing his feelings/concerns related to end of life/goals of care   [facility] CM Short Term Goal #2 (0-30 days)  in the next 30 days, patient will consider questions and concerns he has about planning for end of life/goals of care and will write them in his blue [facility] Care management calandar/notebook    [facility] CM Short Term Goal #2 Start Date  01/11/15   Interventions for Short  Term Goal #2  utilizing teachback method and blue [facility]N Calendar/notebook, reviewed importance of considering plans and goals of care whether patient enters end of life stage or if end of life is some time off       [place] Management  [phone]

## 2015-02-07 ENCOUNTER — Other Ambulatory Visit: Payer: Self-pay | Admitting: *Deleted

## 2015-02-07 NOTE — Patient Outreach (Signed)
[facility]) Care Management  [place]  02/07/2015   [name] 1935/04/23 [id number]  Subjective: [name] wife [name] called today to report that he has increased swelling of the feet and ankles. [name] confirms this report and says the swelling is up to mid calf. He also reports worsening shortness of breath and profound fatigue with a desire to sleep all the time. [name] says he is NOT taking is Lasix regularly and has not taken it today. His weight was 217 (up 10#). He has a history of urinary retention but says he is not having problems emptying his bladder in recent weeks.   [name] also reports he forgot to request a refill on his Symbicort which he receives via mail order from the [place]. He is asking for assistance with getting a sample or a prescription called in somewhere locally.   Current Medications:  Current Outpatient Prescriptions  Medication Sig Dispense Refill  . albuterol (PROAIR HFA) 108 (90 BASE) MCG/ACT inhaler Inhale 2 puffs into the lungs every 6 (six) hours as needed. 1 Inhaler 3  . carvedilol (COREG) 6.25 MG tablet Take 6.25 mg by mouth 2 (two) times daily with a meal.    . diltiazem (CARDIZEM) 120 MG tablet Take 120 mg by mouth daily.    Marland Kitchen EPINEPHrine 0.3 mg/0.3 mL IJ SOAJ injection Inject into the muscle once.    . fluticasone (FLOVENT HFA) 220 MCG/ACT inhaler Inhale 2 puffs into the lungs 2 (two) times daily.    . furosemide (LASIX) 40 MG tablet 1 by mouth daily as needed    . guaiFENesin (MUCINEX) 600 MG 12 hr tablet Take 600 mg by mouth 2 (two) times daily.    Marland Kitchen levalbuterol (XOPENEX) 0.63 MG/3ML nebulizer solution Take 1 ampule by nebulization 4 (four) times daily. For shortness of breath    . montelukast (SINGULAIR) 10 MG tablet Take 10 mg by mouth daily.      . nitroGLYCERIN (NITROSTAT) 0.4 MG SL tablet Place 0.4 mg under the tongue every 5 (five) minutes as needed. For chest pain    . omalizumab (XOLAIR) 150 MG  injection Inject 150 mg into the skin every 28 (twenty-eight) days.    . pantoprazole (PROTONIX) 40 MG tablet Take 40 mg by mouth daily.    . potassium chloride (MICRO-K) 10 MEQ CR capsule Take 10 mEq by mouth.    . prasugrel (EFFIENT) 10 MG TABS tablet Take 10 mg by mouth daily.    . rosuvastatin (CRESTOR) 40 MG tablet Take 40 mg by mouth daily.    Marland Kitchen terazosin (HYTRIN) 2 MG capsule Take 2 mg by mouth at bedtime.     . valsartan (DIOVAN) 80 MG tablet Take 80 mg by mouth daily.     No current facility-administered medications for this visit.    Assessment:  Worsening shortness of breath and swelling of feet/ankles. Out of maintenance COPD medication. Not taking other medications (Lasix) as prescribed.   Plan:  [name] will take his Lasix dose this afternoon and again in the morning. He is aware that he will likely be up to the bathroom a good bit tonight but is willing because the swelling and shortness of breath are uncomfortable.   I will follow up with [name] office in the morning regarding an office visit.   I will contact [name]'s office in the morning to see if we can get assistance with Symbicort need.    Delrae Rend  [facility] [facility]  [phone]

## 2015-02-08 ENCOUNTER — Other Ambulatory Visit: Payer: Self-pay | Admitting: *Deleted

## 2015-02-08 ENCOUNTER — Telehealth: Payer: Self-pay | Admitting: Pulmonary Disease

## 2015-02-08 ENCOUNTER — Telehealth: Payer: Self-pay | Admitting: Cardiovascular Disease

## 2015-02-08 NOTE — Telephone Encounter (Signed)
LMTC x 1  

## 2015-02-08 NOTE — Patient Outreach (Signed)
[facility]) Care [name]   02/08/2015  [name] 1935/02/05 [id number]  [name] is an 80 y.o. male  Subjective: [name] is following [name] for assistance with [name] of his severe COPD with chronic respiratory failure. He also has known severe cardiomyopathy and has an implanted defibrillator. He was receiving pulmonary care from the [facility] until recently when he asked for assistance in transitioning his pulmonary care to [name] of [facility]. Now that Dr. Gwenette [name] is taking a position at the [facility], [name] will see him there. He has had progressive shortness of breath over the last year and has sought help for improvement in [name] of COPD, particularly the asthmatic component.   [name] reports significantly worsening shortness of breath at rest over the last 6 months and in particular in the last month since pollen levels are high.   In addition, [name] has struggled with urinary retention and is followed by urology at [facility] ([name]).   Objective:  BP 100/70 mmHg  Pulse 76  Wt 213 lb (96.616 kg)  SpO2 95%  Review of Systems  Constitutional: Negative.   HENT: Negative.   Eyes: Negative.   Respiratory: Positive for cough and shortness of breath.        Non productive cough  Cardiovascular: Positive for orthopnea. Negative for leg swelling.  Gastrointestinal: Negative.   Genitourinary: Negative.   Musculoskeletal: Negative.   Skin: Negative.   Neurological: Negative.   Endo/Heme/Allergies: Negative.   Psychiatric/Behavioral:       Patient admits to struggling with "feeling blue and sad" since his son died of accidental drug overdose a few months ago; denies feelings of hopelessness     Physical Exam  Constitutional: He is oriented to person, place, and time. Vital signs are normal. He appears well-developed and well-nourished. He is active.  Cardiovascular: Normal rate and  regular rhythm.   GI: Soft. Bowel sounds are normal.  Neurological: He is alert and oriented to person, place, and time.  Skin: Skin is warm, dry and intact.  Psychiatric: He has a normal mood and affect. His speech is normal and behavior is normal. Judgment and thought content normal. Cognition and memory are normal.    Current Medications:   Current Outpatient Prescriptions  Medication Sig Dispense Refill  . albuterol (PROAIR HFA) 108 (90 BASE) MCG/ACT inhaler Inhale 2 puffs into the lungs every 6 (six) hours as needed. 1 Inhaler 3  . carvedilol (COREG) 6.25 MG tablet Take 6.25 mg by mouth 2 (two) times daily with a meal.    . diltiazem (CARDIZEM) 120 MG tablet Take 120 mg by mouth daily.    Marland Kitchen EPINEPHrine 0.3 mg/0.3 mL IJ SOAJ injection Inject into the muscle once.    . fluticasone (FLOVENT HFA) 220 MCG/ACT inhaler Inhale 2 puffs into the lungs 2 (two) times daily.    . furosemide (LASIX) 40 MG tablet 1 by mouth daily as needed    . guaiFENesin (MUCINEX) 600 MG 12 hr tablet Take 600 mg by mouth 2 (two) times daily.    Marland Kitchen levalbuterol (XOPENEX) 0.63 MG/3ML nebulizer solution Take 1 ampule by nebulization 4 (four) times daily. For shortness of breath    . montelukast (SINGULAIR) 10 MG tablet Take 10 mg by mouth daily.      . nitroGLYCERIN (NITROSTAT) 0.4 MG SL tablet Place 0.4 mg under the tongue every 5 (five) minutes as needed. For chest pain    .  omalizumab (XOLAIR) 150 MG injection Inject 150 mg into the skin every 28 (twenty-eight) days.    . pantoprazole (PROTONIX) 40 MG tablet Take 40 mg by mouth daily.    . potassium chloride (MICRO-K) 10 MEQ CR capsule Take 10 mEq by mouth.    . prasugrel (EFFIENT) 10 MG TABS tablet Take 10 mg by mouth daily.    . rosuvastatin (CRESTOR) 40 MG tablet Take 40 mg by mouth daily.    Marland Kitchen terazosin (HYTRIN) 2 MG capsule Take 2 mg by mouth at bedtime.     . valsartan (DIOVAN) 80 MG tablet Take 80 mg by mouth daily.     No current facility-administered  medications for this visit.    Functional Status:   In your present state of health, do you have any difficulty performing the following activities: 01/11/2015 12/07/2014  Hearing? N N  Vision? N N  Difficulty concentrating or making decisions? N N  Walking or climbing stairs? N Y  Dressing or bathing? Y N  Doing errands, shopping? N N  Preparing Food and eating ? N N  Using the Toilet? N N  In the past six months, have you accidently leaked urine? N N  Do you have problems with loss of bowel control? N N  Managing your Medications? N N  Managing your Finances? N N  Housekeeping or managing your Housekeeping? N N    Fall/Depression Screening:    PHQ 2/9 Scores 01/11/2015 12/07/2014  PHQ - 2 Score 0 1    Assessment:    Shortness of Breath and Leg Swelling - [name] called yesterday to say that he had swelling and shortness of breath; he said he'd not been taking his Lasix and I advised that he restart it. He took Lasix last evening, had excellent output, and lost 4 pounds (217 > 213); he says his leg swelling is improved; on exam he has no peripheral edema today; his breath sounds are diminished as usual but I do not hear rales/rhonchi; however, he has high upper airway expiratory wheezes and a very shortened expiratory phase;   I contacted the [place], [place] @ [phone] in an effort to reach Dr. Alroy Dust [name]'s office (pulmonary). I was put in a voice mailbox and left a message explaining that [name] is out of Symbicort and will not receive it from his mail order pharmacy for 2 more weeks and requested a return call. [name] wife texted Dr. Tona [name] and asked him to call while I was present for my visit.    Plan:   [name] will take all medications as prescribed, including Lasix, daily.   [name] will await call from [name] office for a patient appointment.   [name] will contact the [facility] about transferring his pulmonary care to Dr. Gwenette [name] at  the [facility] as this office is [facility] closer for [name].   [name] will see Dr. Donia Ast at [facility] on 02/18/15.  [name] will [name] (urology) on 03/02/15.   [facility] CM Care Plan Problem One        Patient Outreach Telephone from 02/07/2015 in [place] Problem One  Worsening shortness of breath at rest   Care Plan for Problem One  Active   [facility] Long Term Goal (31-90 days)  Patient will receive appropriate cardiology care in the next 31 days   [place] Term Goal Start Date  02/07/15  Interventions for Problem One Long Term Goal  utilizing teachback method, reviewed with patient and spouse the importance of seeing cardiology provider and understanding plan of care for treatment of worsening CHF synptoms,  left message with cardiology office requesting return call regarding need for office visit   [facility] CM Short Term Goal #1 (0-30 days)  patient will take medications exactly as prescribed over the next 30 days as evidenced by patient/spouse report   [facility] CM Short Term Goal #1 Start Date  02/07/15   Interventions for Short Term Goal #1  utilizing teachback method, reviewed Lasix and Potassium prescribing instructions with patient and discussed the importance of adherence to prescribed medication regimen   [facility] CM Short Term Goal #2 (0-30 days)  patient will see cardiology provider in the next 30 days    [facility] CM Short Term Goal #2 Start Date  02/07/15   Interventions for Short Term Goal #2  left message requesting return call from cardiology office to assist with scheduling provider appointment    [place] Problem Two        Patient Outreach Telephone from 01/11/2015 in [place] Problem Two  No established goals of care/long term plan of care   Care Plan for Problem Two  Active   Interventions for Problem Two Long Term Goal   utilizing active listening, encouraged patient to discuss his feelings/concerns  relatd to end of life / goals of care   [facility] Long Term Goal (31-90) days  patient will discuss concerns about end of life with his chosen family members over the next 31 days as evidenced by his report of same on our next face to face visit   [facility] Long Term Goal Start Date  01/11/15   [facility] CM Short Term Goal #1 (0-30 days)  in the next 2 weeks, patient will get in touch with his daughter (lives out of town) to determine a time they can meet to discuss patient concerns/feelings about end of life    [facility] CM Short Term Goal #1 Start Date  01/11/15   Interventions for Short Term Goal #2   utilizing teachback method, discussed with patient the importance of expressing his feelings/concerns related to end of life/goals of care   [facility] CM Short Term Goal #2 (0-30 days)  in the next 30 days, patient will consider questions and concerns he has about planning for end of life/goals of care and will write them in his blue [facility] Care [name] calandar/notebook    [facility] CM Short Term Goal #2 Start Date  01/11/15   Interventions for Short Term Goal #2  utilizing teachback method and blue [facility] Calendar/notebook, reviewed importance of considering plans and goals of care whether patient enters end of life stage or if end of life is some time off       [place] [name]  [phone]

## 2015-02-08 NOTE — Telephone Encounter (Signed)
Spoke with Delrae Rend and she states pt will be out of Symbicort for 10 days before his next shipment  Comes in and she wanted advice on what to do.   I advised her that we do not have Symbicort on pt med list .  She states she will talk with pt and see if she can figure out what meds he is on from here and the [place].  She will call back if anything else is needed.

## 2015-02-08 NOTE — Telephone Encounter (Signed)
Received a call from [facility] with [place].She stated she spoke to patient yesterday and she is on the way out to his home to check him.Stated she thinks patient is volume over loaded.Stated patient has increase swelling in both legs,sob.Stated sob no worse than normal.Stated he has not been taking his medications.Stated his son died recently and he is depressed.Stated he needs appointment with [facility] or any cardiologist who can see him.Stated he has not seen cardiologist in a long time.Advised I will have schedulers call him with a appointment.

## 2015-02-08 NOTE — Telephone Encounter (Signed)
Nurse will give you what is going on with the patient:

## 2015-02-12 ENCOUNTER — Other Ambulatory Visit: Payer: Self-pay | Admitting: *Deleted

## 2015-02-12 NOTE — Patient Outreach (Signed)
[facility]) Care Management  02/12/2015  [name] [birth year] [id number]  I received a call from [name]'s wife and HCPOA [name]. [name] reports that she and [name] did not hear back from [name] office at the [place], [place] [phone]) after we both called on Friday to ask for assistance in getting an emergency supply of his Symbicort inhaler as he will not receive it from his mail order pharmacy for another 10 days. I was able to reach [name] on the [place] pharmacy help line who was very helpful and is going to pass along a message to the pharmacy staff who will in turn call [name] directly to assist him with his pharmacy needs.    [place] [facility]  [phone]

## 2015-02-14 ENCOUNTER — Ambulatory Visit: Payer: Medicare Other | Admitting: *Deleted

## 2015-02-20 DIAGNOSIS — Z1389 Encounter for screening for other disorder: Secondary | ICD-10-CM | POA: Diagnosis not present

## 2015-02-20 DIAGNOSIS — Z0000 Encounter for general adult medical examination without abnormal findings: Secondary | ICD-10-CM | POA: Diagnosis not present

## 2015-03-02 DIAGNOSIS — N35011 Post-traumatic bulbous urethral stricture: Secondary | ICD-10-CM | POA: Diagnosis not present

## 2015-03-02 DIAGNOSIS — N183 Chronic kidney disease, stage 3 (moderate): Secondary | ICD-10-CM | POA: Diagnosis not present

## 2015-03-02 DIAGNOSIS — C61 Malignant neoplasm of prostate: Secondary | ICD-10-CM | POA: Diagnosis not present

## 2015-03-06 ENCOUNTER — Ambulatory Visit: Payer: Medicare Other | Admitting: *Deleted

## 2015-03-13 ENCOUNTER — Encounter: Payer: Self-pay | Admitting: *Deleted

## 2015-03-13 ENCOUNTER — Other Ambulatory Visit: Payer: Self-pay | Admitting: *Deleted

## 2015-03-13 NOTE — Patient Outreach (Signed)
[place] [name]) Care Management   03/13/2015  KALIEB FREELAND 1934/12/29 [id number]  ROGERICK BALDWIN is an 80 y.o. male  Subjective: [place] [facility] is following Mr. Norem for assistance with management of his severe COPD with chronic respiratory failure. He also has known severe cardiomyopathy and has an implanted defibrillator. Mr. Elsayed reports significantly worsening shortness of breath at rest over the last 6 months.   Objective:  BP 118/72 mmHg  Pulse 78  SpO2 98%  Review of Systems  Constitutional: Negative.   HENT:       Patient complains of intermittent "loss of voice and/or hoarseness"  Eyes: Negative.   Respiratory: Positive for cough, sputum production and shortness of breath. Negative for wheezing.   Cardiovascular: Negative for chest pain, palpitations and leg swelling.  Gastrointestinal: Positive for constipation.  Genitourinary: Positive for dysuria.  Musculoskeletal: Positive for myalgias. Negative for falls.  Skin: Positive for rash.       Psoriatic lesions both elbows  Neurological: Negative.   Endo/Heme/Allergies: Bruises/bleeds easily.  Psychiatric/Behavioral: Negative.     Physical Exam  Constitutional: He is oriented to person, place, and time. Vital signs are normal. He appears well-developed and well-nourished. He is active.  Neck: No JVD present.  Cardiovascular: Normal rate, regular rhythm and intact distal pulses.  Exam reveals distant heart sounds.   Pulses:      Dorsalis pedis pulses are 1+ on the right side, and 1+ on the left side.  Respiratory: Effort normal. No respiratory distress. He has no wheezes. He has no rales.  GI: Soft. Bowel sounds are normal.  Neurological: He is alert and oriented to person, place, and time.  Skin: Skin is warm, dry and intact. Rash noted. Rash is macular.     Psychiatric: He has a normal mood and affect. His speech is normal and behavior is normal. Judgment and thought content normal.  Cognition and memory are normal.    Current Medications:   Current Outpatient Prescriptions  Medication Sig Dispense Refill  . albuterol (PROAIR HFA) 108 (90 BASE) MCG/ACT inhaler Inhale 2 puffs into the lungs every 6 (six) hours as needed. 1 Inhaler 3  . carvedilol (COREG) 6.25 MG tablet Take 6.25 mg by mouth 2 (two) times daily with a meal.    . diltiazem (CARDIZEM) 120 MG tablet Take 120 mg by mouth daily.    Marland Kitchen EPINEPHrine 0.3 mg/0.3 mL IJ SOAJ injection Inject into the muscle once.    . fluticasone (FLOVENT HFA) 220 MCG/ACT inhaler Inhale 2 puffs into the lungs 2 (two) times daily.    . furosemide (LASIX) 40 MG tablet 1 by mouth daily as needed    . guaiFENesin (MUCINEX) 600 MG 12 hr tablet Take 600 mg by mouth 2 (two) times daily.    Marland Kitchen levalbuterol (XOPENEX) 0.63 MG/3ML nebulizer solution Take 1 ampule by nebulization 4 (four) times daily. For shortness of breath    . montelukast (SINGULAIR) 10 MG tablet Take 10 mg by mouth daily.      . nitroGLYCERIN (NITROSTAT) 0.4 MG SL tablet Place 0.4 mg under the tongue every 5 (five) minutes as needed. For chest pain    . omalizumab (XOLAIR) 150 MG injection Inject 150 mg into the skin every 28 (twenty-eight) days.    . pantoprazole (PROTONIX) 40 MG tablet Take 40 mg by mouth daily.    . potassium chloride (MICRO-K) 10 MEQ CR capsule Take 10 mEq by mouth.    . prasugrel (EFFIENT) 10 MG  TABS tablet Take 10 mg by mouth daily.    . rosuvastatin (CRESTOR) 40 MG tablet Take 40 mg by mouth daily.    Marland Kitchen terazosin (HYTRIN) 2 MG capsule Take 2 mg by mouth at bedtime.     . valsartan (DIOVAN) 80 MG tablet Take 80 mg by mouth daily.     No current facility-administered medications for this visit.     Assessment:    Chronic Health Condition (COPD)  - Mr. Westman received Symbicort inhalers last month after we requested new prescription call in from his pulmonologist.; today, Mr. Bunton reports continually worsening shortness of breath, increase in  sneezing episodes that result in clear drainage from his nose which he reports prevents easily breathing through his nose until drainage is cleared; he states he is unable to create enough force to blow his nose which adds to his predicament with the nose drainage; he denies increase in sputum production or cough and reports sputum is yellow in color at baseline and has not changed. Breath sounds are diminished throughout (baseline) but without wheezes/rales/rhonchi; expiratory phase is short  Mr. Rallis wishes to see Dr. Danton Sewer at [name] for pulmonary care. He says he has a contact there (he was previously a patient at [place], [place]) and will call for transfer and new patient appointment with Dr. [name].   Elisabeth Most, PharmD ([facility]) saw Mr. Lumsden in a joint visit with me today, performed medication reconciliation and education, recommends the following:   PHARMACY RECOMMENDATION: Current COPD regimen consists of Symbicort 160/4.5 two puffs BID, levalbuterol HFA and nebulizer Q4h PRN, ipratropium nebulizer Q4h PRN, prednisone $RemoveBeforeD'5mg'[id number]$  QA'10mg'$  QHS, and oxygen therapy (3L at rest, 6L during exertion).  Of note, patient's current regimen does not include a long-acting anticholinergic.  Patient may benefit from the addition of tiotropium.  Would consider using the Spiriva Respimat formulation to aid in drug delivery for patient with severe airflow limitation.      Acute Health Condition (peripheral edema) resolved - Last month, Mr. States was experiencing bilateral lower extremity peripheral edema and worsening shortness of breath; he said he'd not been taking his Lasix and I advised that he restart it. He started it and had excellent urine output, losing 4 pounds overnight; on exam today, he has no peripheral edema, no JVD, and breath sounds are clear  *Last month I contacted Dr. Evette Georges office to request new patient appointment for Mr. Spoonemore as he would like to resume  care with Dr. [name] but has not seen him since 2008. Mr. Bail did not hear from the office. I called today and we secured an appointment for May 29, 2015 @ 1:45pm. I reviewed address with patient.    Acute/Chronic Health Condition (Psoriasis) - Mr. Rogstad has several psoriatic lesions on bilateral elbows today; he says he is concerned because the lesions seem to be "worse and spreading"; I advised that he report this to his primary care provider; he has not started prescribed triamcinolone cream - recommended starting   Chronic Health Condition (h/o bladder CA w/ post-traumatic bulbous urethral stricture/mixed incontinence) - Mr. Kring saw Dr. [name] (urology) on 03/02/15 and will follow up on 03/19/15 for procedure (urinary retention)    Plan:   Mr. Kissick will take all medications as prescribed, including Lasix, daily.   Mr. Toruno will begin application of prescribed triamcinolone cream and will notify his provider if his skin condition worsens or if he has any concerns about new lesions.  Mr. Deloatch will see Dr. [name] on May 29, 2015 @ 1:45pm [address], [place], [place].  Mr. Rock will contact the [facility] about transferring his pulmonary care to Dr. [name] at the [facility] as this office is [facility] closer for Mr. Veracruz.    [facility] CM Care Plan Problem One        Patient Outreach from 03/13/2015 in [place] Problem One  Worsening shortness of breath at rest   Care Plan for Problem One  Not Active   THN Long Term Goal (31-90 days)  Patient will receive appropriate cardiology care in the next 31 days   [place] Term Goal Start Date  02/07/15   [facility] Long Term Goal Met Date  03/13/15   Interventions for Problem One Long Term Goal  reviewed improved status peripheral edema, medication adherence (was not previously taking Lasix),  helped patient secure cardiology provider appointment   [facility] CM Short Term Goal #1 (0-30 days)  patient will take  medications exactly as prescribed over the next 30 days as evidenced by patient/spouse report   THN CM Short Term Goal #1 Start Date  02/07/15   [facility] CM Short Term Goal #1 Met Date  03/13/15   Interventions for Short Term Goal #1  utilizing teachback method, reviewed Lasix and Potassium prescribing instructions with patient and discussed the importance of adherence to prescribed medication regimen   THN CM Short Term Goal #2 (0-30 days)  patient will verbalize plans to see cardiology provider in October and will note in his master calendar    [facility] CM Short Term Goal #2 Start Date  03/13/15   [facility] CM Short Term Goal #2 Met Date  03/13/15 Barrie Folk not met,  revised for July]   Interventions for Short Term Goal #2  assisted patient with contacting provider office to secure October appointment    [place] Problem Three        Patient Outreach from 03/13/2015 in [place] Problem Three  Medication Management Concerns   Care Plan for Problem Three  Active   THN Long Term Goal (31-90) days  patient will verbalize receipt of information regarding maintenance medications for use in treatment of COPD over the next 31 days   THN Long Term Goal Start Date  03/13/15   Interventions for Problem Three Long Term Goal  PharmD face to face interview with patient/medication reconciliation and med review,  RNCM contacted primary care provider to extend PharmD recommendation while patient is re-establishing care with pulmonary provider   THN CM Short Term Goal #1 (0-30 days)  over the next 30 days, patient and spouse will verbalize understanding of medications prescribed for treatment of COPD    THN CM Short Term Goal #1 Start Date  03/13/15   Interventions for Short Term Goal #1  utilizing teachback method, reviewed medications with patient during PharmD face to face visit       [place] [facility]  [phone]

## 2015-03-19 DIAGNOSIS — N35011 Post-traumatic bulbous urethral stricture: Secondary | ICD-10-CM | POA: Diagnosis not present

## 2015-04-24 ENCOUNTER — Encounter: Payer: Self-pay | Admitting: *Deleted

## 2015-04-24 ENCOUNTER — Other Ambulatory Visit: Payer: Self-pay | Admitting: *Deleted

## 2015-04-24 NOTE — Patient Outreach (Signed)
[facility]) Care Management   04/24/2015  CUINN WESTERHOLD 1935/08/13 [id number]  SHEIKH LEVERICH is an 80 y.o. male  Subjective: [facility] is following Mr. Riemenschneider for assistance with management of his severe COPD with chronic respiratory failure. He also has known severe cardiomyopathy and has an implanted defibrillator. Mr. Teater reports significantly worsening shortness of breath at rest over the last 6 months.   Objective:  BP 108/74 mmHg  Pulse 84  Ht 1.702 m (_0 )  Wt 217 lb 8 oz (98.657 kg)  BMI 34.06 kg/m2  SpO2 98%   Review of Systems  Constitutional:       Generalized weakness and profound fatigue with minimal activity  HENT: Negative.   Eyes: Negative.   Respiratory: Positive for shortness of breath.        No shortness of breath at rest but profound shortness of breath with as few as 10 steps across the room  Cardiovascular: Positive for leg swelling.       1+ bilateral pedal edema  Gastrointestinal: Negative.   Genitourinary: Negative for dysuria.  Musculoskeletal: Negative for falls.       No falls but hit right elbow on armoire and sustained laceration; now healing w/ scab - no open skin  Skin: Positive for rash.       Improved psoriatic rash bilateral elbows  Neurological: Positive for weakness.  Endo/Heme/Allergies: Negative.   Psychiatric/Behavioral: Negative.     Physical Exam  Constitutional: He is oriented to person, place, and time. Vital signs are normal. He appears well-developed and well-nourished. He is active.  Cardiovascular: Normal rate and regular rhythm.   Respiratory: Effort normal. He has no wheezes. He has no rales.  Breath sounds clear but diminished throughout  GI: Soft. Bowel sounds are normal.  Neurological: He is alert and oriented to person, place, and time.  Skin: Skin is warm. Rash noted. No cyanosis.     Psoriatic rash bilateral elbows  Psychiatric: He has a normal mood and affect. His speech is  normal and behavior is normal. Judgment and thought content normal. Cognition and memory are normal.  Drastically improved emotional state; recently encountered an old church friend with whom he spent some time and states he felt encouraged for the first time    Current Medications:   Current Outpatient Prescriptions  Medication Sig Dispense Refill  . albuterol (PROAIR HFA) 108 (90 BASE) MCG/ACT inhaler Inhale 2 puffs into the lungs every 6 (six) hours as needed. 1 Inhaler 3  . aspirin 81 MG tablet Take 81 mg by mouth daily.    . budesonide-formoterol (SYMBICORT) 160-4.5 MCG/ACT inhaler Inhale 2 puffs into the lungs 2 (two) times daily.    . carvedilol (COREG) 6.25 MG tablet Take 6.25 mg by mouth 2 (two) times daily with a meal.    . cetirizine (ZYRTEC) 10 MG tablet Take 10 mg by mouth daily.    Marland Kitchen diltiazem (CARDIZEM) 120 MG tablet Take 120 mg by mouth daily.    Marland Kitchen EPINEPHrine 0.3 mg/0.3 mL IJ SOAJ injection Inject into the muscle once.    . ferrous sulfate 325 (65 FE) MG tablet Take 325 mg by mouth daily with breakfast.    . fluticasone (FLOVENT HFA) 220 MCG/ACT inhaler Inhale 2 puffs into the lungs 2 (two) times daily.    . furosemide (LASIX) 40 MG tablet 1 by mouth daily as needed    . gabapentin (NEURONTIN) 600 MG tablet Take 600 mg by mouth 3 (  three) times daily.    Marland Kitchen guaiFENesin (MUCINEX) 600 MG 12 hr tablet Take 600 mg by mouth 2 (two) times daily.    Marland Kitchen ipratropium-albuterol (DUONEB) 0.5-2.5 (3) MG/3ML SOLN Take 3 mLs by nebulization every 4 (four) hours as needed.    . levalbuterol (XOPENEX HFA) 45 MCG/ACT inhaler Inhale into the lungs every 4 (four) hours as needed for wheezing.    . levalbuterol (XOPENEX) 0.63 MG/3ML nebulizer solution Take 1 ampule by nebulization 4 (four) times daily. For shortness of breath    . montelukast (SINGULAIR) 10 MG tablet Take 10 mg by mouth daily.      . nitroGLYCERIN (NITROSTAT) 0.4 MG SL tablet Place 0.4 mg under the tongue every 5 (five) minutes as  needed. For chest pain    . omalizumab (XOLAIR) 150 MG injection Inject 150 mg into the skin every 28 (twenty-eight) days.    . pantoprazole (PROTONIX) 40 MG tablet Take 40 mg by mouth daily.    . potassium chloride (MICRO-K) 10 MEQ CR capsule Take 10 mEq by mouth.    . prasugrel (EFFIENT) 10 MG TABS tablet Take 10 mg by mouth daily.    . predniSONE (DELTASONE) 5 MG tablet Take 5 mg by mouth daily with breakfast. 5MG QAM AND 10MG QHS    . ranitidine (ZANTAC) 150 MG capsule Take 150 mg by mouth 2 (two) times daily.    . rosuvastatin (CRESTOR) 40 MG tablet Take 40 mg by mouth daily.    Marland Kitchen terazosin (HYTRIN) 2 MG capsule Take 2 mg by mouth at bedtime.     . triamcinolone cream (KENALOG) 0.5 % Apply 1 application topically 2 (two) times daily.    . valsartan (DIOVAN) 80 MG tablet Take 80 mg by mouth daily.     No current facility-administered medications for this visit.    Functional Status:   In your present state of health, do you have any difficulty performing the following activities: 03/13/2015 01/11/2015  Hearing? N N  Vision? N N  Difficulty concentrating or making decisions? N N  Walking or climbing stairs? Y N  Dressing or bathing? N Y  Doing errands, shopping? N N  Preparing Food and eating ? - N  Using the Toilet? - N  In the past six months, have you accidently leaked urine? - N  Do you have problems with loss of bowel control? - N  Managing your Medications? - N  Managing your Finances? - N  Housekeeping or managing your Housekeeping? - N    Fall/Depression Screening:    PHQ 2/9 Scores 03/13/2015 01/11/2015 12/07/2014  PHQ - 2 Score 1 0 1    Assessment:    Chronic Health Condition (COPD) - Mr. Decaprio reports continually worsening shortness of breath over the last 6 months. Breath sounds are diminished throughout (baseline) but without wheezes/rales/rhonchi; expiratory phase is short; today, Mr. Adcock states he feels well today and in "pretty good shape" but reiterates that  his pulmonary status overall is worsened over the last 6 months  Mr. Mercado wishes to see Dr. Danton Sewer at [facility] for pulmonary care. He says he has a contact there (he was previously a patient at [place], [place]) and will call for transfer and new patient appointment with Dr. [name].    Acute Health Condition (peripheral edema) resolved - Last month, Mr. Burgeson was experiencing bilateral lower extremity peripheral edema and worsening shortness of breath; he said he'd not been taking his Lasix and I advised that  he restart it. He started it and had excellent urine output, losing 4 pounds overnight; on exam today, he has trace - 1+ bilateral peripheral edema, no JVD, and breath sounds are unusually clear  Acute/Chronic Health Condition (Psoriasis) - Mr. Arneson has several psoriatic lesions on bilateral elbows lat month; he says he is concerned because the lesions seem to be "worse and spreading"; I advised that he report this to his primary care provider which he did and was started on triamcinolone cream which has greatly improved his psoriasis  Chronic Health Condition (h/o bladder CA w/ post-traumatic bulbous urethral stricture/mixed incontinence) - Mr. Fuqua saw Dr. [name] (urology) on 03/02/15 and is experiencing no bladder issues currently; he continues daily self catheterization   Plan:   Mr. Falotico will take all medications as prescribed, including Lasix, daily.   Mr. Joslyn will continue application of prescribed triamcinolone cream and will notify his provider if his skin condition worsens or if he has any concerns about new lesions.   Mr. Heindl will see Dr. [name] on May 29, 2015 @ 1:45pm [address], [place], [place].  Mr. Everard will contact the [facility] about transferring his pulmonary care to Dr. [name] at the [facility] as this office is [facility] closer for Mr. Rattan.   THN CM Care Plan Problem One        Most Recent Value   Care Plan Problem One   Home Safety/Fall Risk   Role Documenting the Problem One  Care Management Coordinator   Care Plan for Problem One  Active   THN Long Term Goal (31-90 days)  Patient will verbalize plan for fall reduction/prevention over the next 31 days   THN Long Term Goal Start Date  04/24/15   Interventions for Problem One Long Term Goal  utilizing teachback method, reviewed with patient strategies for fall prevention and risk reduction   THN CM Short Term Goal #1 (0-30 days)  Over the next 30 days, patient will discuss fall prevention strategies with spouse/daughter   THN CM Short Term Goal #1 Start Date  04/24/15   Interventions for Short Term Goal #1  utilizing teachback method, reviewed with patient need to discuss plan with spouse    [facility] CM Care Plan Problem Two        Most Recent Value   Care Plan Problem Two  Needs established relationships with specialty providers   Role Documenting the Problem Two  Care Management [place] for Problem Two  Active   Interventions for Problem Two Long Term Goal   ensured that patient has needed [facility] and encouraged [facility] contact [facility] this week to [facility]-establish care with pulmonary provider at [facility] site,  asked spouse for assitance with encouraging patient to contact [place] for appointment   [facility] Long Term Goal (31-90) days  Patient will establish care with pulmonary provider in the next 31 days as evidenced by patient report of scheduled appointment at the [facility] in [place] Term Goal Start Date  04/24/15   [facility] Long Term Goal Met Date  -- [Not met last month  - reestablished for September]    [facility] CM Care Plan Problem Three        Most Recent Value   Care Plan Problem Three  Medication Management Concerns   Role Documenting the Problem Three  Care Management [place] for Problem Three  Active  THN Long Term Goal (31-90) days  patient will verbalize receipt of information regarding  maintenance medications for use in treatment of COPD over the next 31 days   THN Long Term Goal Start Date  03/13/15   [facility] Long Term Goal Met Date  04/24/15   Interventions for Problem Three Long Term Goal  PharmD face to face interview with patient/medication reconciliation and med review,  RNCM contacted primary care provider to extend PharmD recommendation while patient is re-establishing care with pulmonary provider   THN CM Short Term Goal #1 (0-30 days)  over the next 30 days, patient and spouse will verbalize understanding of medications prescribed for treatment of COPD    THN CM Short Term Goal #1 Start Date  03/13/15   [facility] CM Short Term Goal #1 Met Date  04/24/15   Interventions for Short Term Goal #1  utilizing teachback method, reviewed medications with patient during PharmD face to face visit      [place] [facility]  [phone]

## 2015-05-18 ENCOUNTER — Other Ambulatory Visit: Payer: Self-pay | Admitting: *Deleted

## 2015-05-18 NOTE — Patient Outreach (Signed)
[facility]) Care Management  05/18/2015  [name] Apr 03, 1935 [id number]  [name] is a 80 year old male who has advanced COPD and chronic respiratory failure. He was previously seeing Dr. Danton Sewer in [place] but has not made an appointment to see Dr [name] since he moved to the [facility] medical center.    I received a call from [name] this morning.His initial complaint was of tachycardia in the 120's. He says he has been using more nebulizer treatments and rescue inhalers in the last 24 hours.  He reports having awakened feeling short of breath and with stuffy nose. He stated he was unable to breathe normally with his oxygen in his nose so he moved it to his mouth and his O2 saturation increased from the 80's to the mid 90's. He says he has felt more short of breath and congested with a stuffy nose for the last 24-48 hours. He denies fever (subjectively). He denies any changes in medications other than taking prn nebs and inhalers as prescribed. He has not been exposed to anyone with an illness per his report. He uses O2 via nasal canula continuously at 4-6L/[place]. He uses CPAP at night.   I contacted Dr. Berenice Bouton' office and spoke with Peggyann Shoals, CNA, providing the report of symptoms as outlined above and via secure electronic transmission.  Janett Billow indicated she would give Dr. Ethlyn Gallery the information and would call [name]. I returned a call to [name] to update him on the plan as outlined above. I advised [name] to call 911 if he became acutely short of breath or experienced any other acute symptoms.   I have a scheduled face to face appointment with [name] next week and will follow up on these symptoms at that time. I asked [name] also to call me if he has any other questions/concerns.    [place]  [phone]

## 2015-05-22 ENCOUNTER — Other Ambulatory Visit: Payer: Self-pay | Admitting: *Deleted

## 2015-05-22 NOTE — Patient Outreach (Signed)
[facility]) Care Management   05/22/2015  [name] February 07, 1935 [id number]  [name] is an 80 y.o. male being followed by [facility] for assistance with management of his severe COPD with chronic respiratory failure. He also has known severe cardiomyopathy and has an implanted defibrillator. Mr. Thayer reports significantly worsening shortness of breath at rest over the last 6 months.   Last week, he had an episode of tachycardia, congestion, shortness of breath and was treated by Dr. Ethlyn Gallery for URI/Sinusitis. Mr. Lansdowne is to see Dr. Danton Sewer tomorrow for an office visit. He is also scheduled to see Dr. [name] on 05/29/15.   Subjective: "I feel 100% better since I took the antibiotics."  Objective:  BP 110/74 mmHg  Pulse 73  Ht 1.702 m (_0 )  Wt 213 lb 6 oz (96.786 kg)  BMI 33.41 kg/m2  SpO2 98%  Review of Systems  Constitutional: Negative.   HENT: Negative.   Eyes: Negative.   Respiratory: Positive for cough, sputum production and shortness of breath. Negative for hemoptysis and wheezing.        Baseline morning cough; clear sputum, easily expectorated; baseline shortness of breath with any activity other than sitting/speaking  Cardiovascular: Negative for chest pain, palpitations and leg swelling.       Palpitations resolved after decreasing use of Albuterol nebs once treated for URI  Gastrointestinal: Negative.   Genitourinary: Negative.        Reports dysuria last week that resolved once he was started on oral antibiotics for URI  Musculoskeletal: Positive for myalgias. Negative for falls.  Skin: Positive for rash.       Improved psoriatic lesions left elbow  Neurological: Negative.   Psychiatric/Behavioral: Negative.        Son passed away unexpectedly over the summer; patient mood improved today; verbalizes progress with coping    Physical Exam  Constitutional: He is oriented to person, place, and time. Vital signs are  normal. He appears well-developed and well-nourished. He is active.  Neck: No JVD present.  Cardiovascular: Normal rate and regular rhythm.   Respiratory: No respiratory distress. He has decreased breath sounds in the right upper field, the right middle field, the right lower field, the left upper field, the left middle field and the left lower field. He has no wheezes. He has no rales.  GI: Soft. Bowel sounds are normal.  Neurological: He is alert and oriented to person, place, and time.  Skin: Skin is warm and dry. Rash noted.     Improved psoriatic rash left elbow  Psychiatric: He has a normal mood and affect. His speech is normal and behavior is normal. Judgment and thought content normal. Cognition and memory are normal.    Current Medications:   Current Outpatient Prescriptions  Medication Sig Dispense Refill  . albuterol (PROAIR HFA) 108 (90 BASE) MCG/ACT inhaler Inhale 2 puffs into the lungs every 6 (six) hours as needed. 1 Inhaler 3  . aspirin 81 MG tablet Take 81 mg by mouth daily.    . budesonide-formoterol (SYMBICORT) 160-4.5 MCG/ACT inhaler Inhale 2 puffs into the lungs 2 (two) times daily.    . carvedilol (COREG) 6.25 MG tablet Take 6.25 mg by mouth 2 (two) times daily with a meal.    . cetirizine (ZYRTEC) 10 MG tablet Take 10 mg by mouth daily.    Marland Kitchen diltiazem (CARDIZEM) 120 MG tablet Take 120 mg by mouth daily.    Marland Kitchen EPINEPHrine 0.3 mg/0.3 mL  IJ SOAJ injection Inject into the muscle once.    . ferrous sulfate 325 (65 FE) MG tablet Take 325 mg by mouth daily with breakfast.    . fluticasone (FLOVENT HFA) 220 MCG/ACT inhaler Inhale 2 puffs into the lungs 2 (two) times daily.    . furosemide (LASIX) 40 MG tablet 1 by mouth daily as needed    . gabapentin (NEURONTIN) 600 MG tablet Take 600 mg by mouth 3 (three) times daily.    Marland Kitchen guaiFENesin (MUCINEX) 600 MG 12 hr tablet Take 600 mg by mouth 2 (two) times daily.    Marland Kitchen ipratropium-albuterol (DUONEB) 0.5-2.5 (3) MG/3ML SOLN Take 3  mLs by nebulization every 4 (four) hours as needed.    . levalbuterol (XOPENEX HFA) 45 MCG/ACT inhaler Inhale into the lungs every 4 (four) hours as needed for wheezing.    . levalbuterol (XOPENEX) 0.63 MG/3ML nebulizer solution Take 1 ampule by nebulization 4 (four) times daily. For shortness of breath    . montelukast (SINGULAIR) 10 MG tablet Take 10 mg by mouth daily.      . nitroGLYCERIN (NITROSTAT) 0.4 MG SL tablet Place 0.4 mg under the tongue every 5 (five) minutes as needed. For chest pain    . omalizumab (XOLAIR) 150 MG injection Inject 150 mg into the skin every 28 (twenty-eight) days.    . pantoprazole (PROTONIX) 40 MG tablet Take 40 mg by mouth daily.    . potassium chloride (MICRO-K) 10 MEQ CR capsule Take 10 mEq by mouth.    . prasugrel (EFFIENT) 10 MG TABS tablet Take 10 mg by mouth daily.    . predniSONE (DELTASONE) 5 MG tablet Take 5 mg by mouth daily with breakfast. 5MG QAM AND 10MG QHS    . ranitidine (ZANTAC) 150 MG capsule Take 150 mg by mouth 2 (two) times daily.    . rosuvastatin (CRESTOR) 40 MG tablet Take 40 mg by mouth daily.    Marland Kitchen terazosin (HYTRIN) 2 MG capsule Take 2 mg by mouth at bedtime.     . triamcinolone cream (KENALOG) 0.5 % Apply 1 application topically 2 (two) times daily.    . valsartan (DIOVAN) 80 MG tablet Take 80 mg by mouth daily.       Functional Status:   In your present state of health, do you have any difficulty performing the following activities: 04/24/2015 03/13/2015  Hearing? N N  Vision? N N  Difficulty concentrating or making decisions? N N  Walking or climbing stairs? Y Y  Dressing or bathing? Y N  Doing errands, shopping? Y N  Preparing Food and eating ? - -  Using the Toilet? - -  In the past six months, have you accidently leaked urine? Y -  Do you have problems with loss of bowel control? - -  Managing your Medications? - -  Managing your Finances? - -  Housekeeping or managing your Housekeeping? Y -    Fall/Depression  Screening:    PHQ 2/9 Scores 03/13/2015 01/11/2015 12/07/2014  PHQ - 2 Score 1 0 1    Assessment:    Chronic Health Condition (COPD) - Mr. Werber reports continually worsening shortness of breath over the last 6 months. Breath sounds are diminished throughout (baseline) but without wheezes/rales/rhonchi; He is being treated for URI and says his symptoms are drastically improved today  Mr. Sandford is to see Dr. Danton Sewer at [facility] tomorrow for pulmonary care.    Acute Health Condition (peripheral edema) resolved -  Mr. Marquess  has not experienced peripheral edema since he restarted his prescribed Lasix. He had been leaving it off.   Acute/Chronic Health Condition (Psoriasis) - Mr. Lenzen has several psoriatic lesions on bilateral elbows lat month;he has been applying triamcinolone cream as prescribed by his primary care provider and he notes improvement.   Chronic Health Condition (h/o bladder CA w/ post-traumatic bulbous urethral stricture/mixed incontinence) - Mr. Foor saw Dr. [name] (urology) on 03/02/15 and is experiencing no bladder issues currently; he continues daily self catheterization  Plan:   I will see Mr. Whittington at home next month for routine home visit.   THN CM Care Plan Problem One        Most Recent Value   Care Plan Problem One  Acute/Chronic Health Condition (COPD) with URI symtpoms   Role Documenting the Problem One  Care Management Coordinator   Care Plan for Problem One  Active   THN Long Term Goal (31-90 days)  Over the next 31 days, patient will not be admitted to the hospital for COPD or respiratory illness   THN Long Term Goal Start Date  05/22/15   [facility] Long Term Goal Met Date  05/22/15   Interventions for Problem One Long Term Goal  Utilizing teachback method, reviewed with patient plan of care for COPD including Action Plan   THN CM Short Term Goal #1 (0-30 days)  Over the next 30 days, patient will verbalize understanding of COPD Action Plan    THN CM Short Term Goal #1 Start Date  05/22/15   [facility] CM Short Term Goal #1 Met Date  05/22/15   Interventions for Short Term Goal #1  Utilizing teachback method, reviewed COPD action plan with patient   THN CM Short Term Goal #2 (0-30 days)  Over the next 30 days, patient will attend pulmonary provider appointment as scheduled   THN CM Short Term Goal #2 Start Date  05/22/15   Interventions for Short Term Goal #2  Reviewed upcoming appointment schedule with patient and reviewed questions he would like to discuss with his provider   THN CM Short Term Goal #3 (0-30 days)  Over the next 30 days, patient will verbalize signs and symptoms of early respiratory illness    THN CM Short Term Goal #3 Start Date  05/22/15   Interventions for Short Tern Goal #3  utilizing teachback method and "stop light" tool, reviewed early signs and symptoms of COPD exacerbation       [facility]  [phone]

## 2015-05-29 ENCOUNTER — Ambulatory Visit (INDEPENDENT_AMBULATORY_CARE_PROVIDER_SITE_OTHER): Payer: Medicare Other | Admitting: Cardiovascular Disease

## 2015-05-29 ENCOUNTER — Encounter: Payer: Self-pay | Admitting: Cardiovascular Disease

## 2015-05-29 VITALS — BP 100/50 | HR 89 | Ht 67.0 in | Wt 216.0 lb

## 2015-05-29 DIAGNOSIS — Z95810 Presence of automatic (implantable) cardiac defibrillator: Secondary | ICD-10-CM

## 2015-05-29 DIAGNOSIS — I5023 Acute on chronic systolic (congestive) heart failure: Secondary | ICD-10-CM

## 2015-05-29 DIAGNOSIS — N183 Chronic kidney disease, stage 3 unspecified: Secondary | ICD-10-CM

## 2015-05-29 DIAGNOSIS — Z79899 Other long term (current) drug therapy: Secondary | ICD-10-CM | POA: Diagnosis not present

## 2015-05-29 DIAGNOSIS — I25810 Atherosclerosis of coronary artery bypass graft(s) without angina pectoris: Secondary | ICD-10-CM

## 2015-05-29 DIAGNOSIS — J9611 Chronic respiratory failure with hypoxia: Secondary | ICD-10-CM

## 2015-05-29 DIAGNOSIS — I10 Essential (primary) hypertension: Secondary | ICD-10-CM

## 2015-05-29 DIAGNOSIS — I252 Old myocardial infarction: Secondary | ICD-10-CM

## 2015-05-29 DIAGNOSIS — J441 Chronic obstructive pulmonary disease with (acute) exacerbation: Secondary | ICD-10-CM

## 2015-05-29 DIAGNOSIS — R0609 Other forms of dyspnea: Secondary | ICD-10-CM | POA: Diagnosis not present

## 2015-05-29 MED ORDER — BISOPROLOL FUMARATE 5 MG PO TABS: 2.5000 mg | ORAL_TABLET | Freq: Every day | ORAL | Status: DC

## 2015-05-29 NOTE — Patient Instructions (Addendum)
  Your physician recommends that you return for lab work TODAY.  Your physician has recommended you make the following change in your medication:start new prescription for bisoprolol. Follow the instructions on the bottle. It has already been sent to your  Pharmacy.  STOP the carvedilol.  You have been referred to pulmonology.  Your physician recommends that you schedule a follow-up appointment in: 4-6 weeks with Dr. [name].

## 2015-05-29 NOTE — Progress Notes (Signed)
Patient ID: [name], male   DOB: 08/22/1935, 80 y.o.   MRN: [id number]     HPI: [name] is a 80 y.o. male who presents to the office today to re-establish cardiology care after not having been seen in excess of 3 years.  Recently he has been followed at the [facility]  in [place].  [name] has a history of significant severe obstructive lung disease with chronic respiratory failure on oxygen supplementation and has been followed by Dr. Keith Clance.   He has known CAD and suffered a initial large anterior wall myocardial infarction in 2006 and suffered a repeat MI in October 2012.  At that time his LAD was occluded at the proximal stent site from 2006.  He required repeat intervention in December 2012.  Due to persistent LV dysfunction despite increased medical therapy.  He ultimately underwent a Medtronic Protecta XT defibrillator in light of severe ischemic cardomyopathy.  The following day he had lead dislodgment and lead revision was performed on 02/10/2012.  He has been on platelet therapy and when last seen in 2013, was on aspirin and Brilinta.  Apparently, he is now on Effient 10 mg daily.  Over the past 3 years.  He is been followed at the [facility] for his Cardiologic care and he also has also been seen by them for pulmonary care.  He denies significant anginal type symptomatology.  However, he has been markedly limited by his significant COPD.  He tells me he was recently seen by Dr. Koberline Pennwyn with COPD exacerbation and was even a prescription for amoxicillin and was told to increase his daily prednisone to 10 mg 4 times a day.  The patient has had significant difficulty with wheezing with minimal activity.  Typically he is on 3 L of oxygen at rest and 6 L of oxygen when he attempts to walk.  However, today he has increased his oxygen 5 L.  He tells me he believes he had an echo Doppler study done at the [facility] within the last 2 months.  I do not have any  records from the [facility].  He also tells me that he will try to be switching his VA records to the [facility] since he understands that Dr. Clance is now there.  He is unaware of any defibrillator discharge.  He admits to shortness of breath with minimal activity.  He presents for cardiology evaluation.  He tells he recently saw Dr. Ron Davis in follow-up of his prostate CA.   Past Medical History  Diagnosis Date  . Myocardial infarction (HCC)   . Asthma   . COPD (chronic obstructive pulmonary disease) (HCC)   . Emphysema   . CAD 04/23/2009  . Anticoagulated on warfarin 07/17/2011  . CHF (congestive heart failure) (HCC)   . Ventricular thrombus following MI (myocardial infarction) (HCC) 11/12    on coumadin  . Angina   . Shortness of breath   . Chronic kidney disease   . Cancer (HCC)   . Hypertension   . Blood transfusion 3 weeks ago  . GERD (gastroesophageal reflux disease)   . Arthritis   . Pneumonia   . Anemia   . Atherosclerosis of native arteries of the extremities with intermittent claudication 08/13/2011  . Heart attack (HCC) October 27,2012 and July 28, 2011  . Heart attack (HCC) October  27,2012 and December 3,2012   . ICD (implantable cardiac defibrillator) in place   . S/P ICD (  internal cardiac defibrillator) procedure, 6.17.13 02/10/2012    Past Surgical History  Procedure Laterality Date  . Coronary stent placement  06/21/11  . Cardiac catheterization    . Groin debridement  07/18/2011    Procedure: GROIN DEBRIDEMENT;  Surgeon: Christopher S Dickson, MD;  Location: [facility] OR;  Service: Vascular;  Laterality: Right;  exploration of right groin,evacuation of right groin hematoma & repair of right superficial femoral artery  . Coronary angioplasty    . Insert / replace / remove pacemaker  02/09/2012    ICD  . Coronary angiogram N/A 07/28/2011    Procedure: CORONARY ANGIOGRAM;  Surgeon: David W Harding, MD;  Location: [facility] CATH LAB;  Service: Cardiovascular;   Laterality: N/A;  . Percutaneous coronary stent intervention (pci-s) N/A 07/28/2011    Procedure: PERCUTANEOUS CORONARY STENT INTERVENTION (PCI-S);  Surgeon: David W Harding, MD;  Location: [facility] CATH LAB;  Service: Cardiovascular;  Laterality: N/A;  . Implantable cardioverter defibrillator implant N/A 02/09/2012    Procedure: IMPLANTABLE CARDIOVERTER DEFIBRILLATOR IMPLANT;  Surgeon: Mihai Croitoru, MD;  Location: [facility] CATH LAB;  Service: Cardiovascular;  Laterality: N/A;  . Lead revision N/A 02/10/2012    Procedure: LEAD REVISION;  Surgeon: Mihai Croitoru, MD;  Location: [facility] CATH LAB;  Service: Cardiovascular;  Laterality: N/A;    Allergies  Allergen Reactions  . Demerol Other (See Comments)    Abnormal behavior  . Zolpidem Tartrate Other (See Comments)    Pt. Became confused and aggitated    Current Outpatient Prescriptions  Medication Sig Dispense Refill  . aspirin 81 MG tablet Take 81 mg by mouth daily.    . budesonide-formoterol (SYMBICORT) 160-4.5 MCG/ACT inhaler Inhale 2 puffs into the lungs 2 (two) times daily.    . cetirizine (ZYRTEC) 10 MG tablet Take 10 mg by mouth daily.    . diltiazem (CARDIZEM) 120 MG tablet Take 120 mg by mouth daily.    . EPINEPHrine 0.3 mg/0.3 mL IJ SOAJ injection Inject into the muscle once.    . ferrous sulfate 325 (65 FE) MG tablet Take 325 mg by mouth daily with breakfast.    . furosemide (LASIX) 40 MG tablet Take 40 mg by mouth as needed for fluid.    . gabapentin (NEURONTIN) 600 MG tablet Take 600 mg by mouth 3 (three) times daily.    . guaiFENesin (MUCINEX) 600 MG 12 hr tablet Take 600 mg by mouth 2 (two) times daily.    . ipratropium-albuterol (DUONEB) 0.5-2.5 (3) MG/3ML SOLN Take 3 mLs by nebulization every 4 (four) hours as needed.    . levalbuterol (XOPENEX HFA) 45 MCG/ACT inhaler Inhale into the lungs every 4 (four) hours as needed for wheezing.    . levalbuterol (XOPENEX) 0.63 MG/3ML nebulizer solution Take 1 ampule by nebulization 4 (four) times  daily. For shortness of breath    . montelukast (SINGULAIR) 10 MG tablet Take 10 mg by mouth daily.      . nitroGLYCERIN (NITROSTAT) 0.4 MG SL tablet Place 0.4 mg under the tongue every 5 (five) minutes as needed. For chest pain    . omalizumab (XOLAIR) 150 MG injection Inject 150 mg into the skin every 28 (twenty-eight) days.    . omeprazole (PRILOSEC) 20 MG capsule Take 20 mg by mouth daily.    . potassium chloride (MICRO-K) 10 MEQ CR capsule Take 10 mEq by mouth.    . prasugrel (EFFIENT) 10 MG TABS tablet Take 10 mg by mouth daily.    . PREDNISONE PO Take 70 mg by mouth   daily.    . ranitidine (ZANTAC) 150 MG capsule Take 150 mg by mouth 2 (two) times daily.    . rosuvastatin (CRESTOR) 40 MG tablet Take 40 mg by mouth daily.    Marland Kitchen terazosin (HYTRIN) 2 MG capsule Take 2 mg by mouth at bedtime.     . triamcinolone cream (KENALOG) 0.5 % Apply 1 application topically 2 (two) times daily.    . valsartan (DIOVAN) 80 MG tablet Take 80 mg by mouth daily.    . bisoprolol (ZEBETA) 5 MG tablet Take 0.5 tablets (2.5 mg total) by mouth daily. 30 tablet 3   No current facility-administered medications for this visit.    Social History   Social History  . Marital Status: Married    Spouse Name: N/A  . Number of Children: N/A  . Years of Education: N/A   Occupational History  . retired    Social History Main Topics  . Smoking status: Former Smoker -- 3.00 packs/day for 40 years    Types: Cigarettes    Quit date: 04/13/2005  . Smokeless tobacco: Never Used  . Alcohol Use: No  . Drug Use: No  . Sexual Activity: No   Other Topics Concern  . Not on file   Social History Narrative   Additional social history is notable in that he is married for 43 years.  He has 5 children, 4 grandchildren 1 great-grandchild.  He is a retired Dealer.  He quit smoking in 2006 but previously had smoked up to 3-4 packs per day for up to 45 years.  He is not exercise.  Family History  Problem Relation Age of  Onset  . Diabetes type II Mother   . Coronary artery disease Mother   . Coronary artery disease Father   . Diabetes type II Brother   . Coronary artery disease Brother   . Craniosynostosis Neg Hx   . Heart attack Mother   . Hypertension Sister   . Stroke Neg Hx    Additional family history is notable that his mother died at 59 with a heart attack.  Father died at age 30.  One brother was killed in the [place] War.  His 2 sisters are deceased, one with Alzheimer's disease and the other secondary to cancer.  ROS General: Negative; No fevers, chills, or night sweats HEENT: Negative; No changes in vision or hearing, sinus congestion, difficulty swallowing Pulmonary: Audible wheezing; positive for severe COPD Cardiovascular: See HPI:  GI: Positive for GERD GU: Negative; No dysuria, hematuria, or difficulty voiding Musculoskeletal: Negative; no myalgias, joint pain, or weakness Hematologic: Negative; no easy bruising, bleeding Endocrine: Negative; no heat/cold intolerance; no diabetes, Neuro: Negative; no changes in balance, headaches Skin: Negative; No rashes or skin lesions Psychiatric: Negative; No behavioral problems, depression Sleep: Negative; No snoring,  daytime sleepiness, hypersomnolence, bruxism, restless legs, hypnogognic hallucinations. Other comprehensive 14 point system review is negative   Physical Exam BP 100/50 mmHg  Pulse 89  Ht 5' 7" (1.702 m)  Wt 216 lb (97.977 kg)  BMI 33.82 kg/m2 Wt Readings from Last 3 Encounters:  05/29/15 216 lb (97.977 kg)  05/22/15 213 lb 6 oz (96.786 kg)  04/24/15 217 lb 8 oz (98.657 kg)   General: When I entered the room.  He was using a inhaler for his wheezing Skin: normal turgor, no rashes, warm and dry HEENT: Normocephalic, atraumatic. Pupils equal round and reactive to light; sclera anicteric; extraocular muscles intact, No lid lag; Nose without nasal septal hypertrophy; Mouth/Parynx  benign; Mallinpatti scale 3 Neck: No JVD, no  carotid bruits; normal carotid upstroke Lungs:  Diffusely decreased breath sounds.  Intermittent wheezing anteriorly and posteriorly Chest wall: without tenderness to palpitation Heart: PMI not displaced, RRR, s1 s2 normal, 1/6 systolic murmur, No diastolic murmur, no rubs, gallops, thrills, or heaves Abdomen: Moderate obesity; soft, nontender; no hepatosplenomehaly, BS+; abdominal aorta nontender and not dilated by palpation. Back: no CVA tenderness Pulses: 2+  Musculoskeletal: full range of motion, normal strength, no joint deformities Extremities: Slightly decreased pulses distally.  Trace ankle edema., no clubbing cyanosis or edema, Homan's sign negative  Neurologic: grossly nonfocal; Cranial nerves grossly wnl Psychologic: Normal mood and affect   ECG (independently read by me): Normal sinus rhythm at 89 bpm.  Right bundle-branch block with repolarization changes.  Q waves V1 through V4 concordant with old anterior MI.  LABS:  BMP Latest Ref Rng 02/10/2012 07/31/2011 07/30/2011  Glucose 70 - 99 mg/dL 117(H) 103(H) 101(H)  BUN 6 - 23 mg/dL _0 Creatinine 0.50 - 1.35 mg/dL 1.47(H) 1.53(H) 1.45(H)  Sodium 135 - 145 mEq/L 140 140 141  Potassium 3.5 - 5.1 mEq/L 4.2 4.7 4.2  Chloride 96 - 112 mEq/L 106 108 106  CO2 19 - 32 mEq/L _1 Calcium 8.4 - 10.5 mg/dL 8.5 8.6 8.4     Hepatic Function Latest Ref Rng 07/28/2011 07/17/2011 06/21/2011  Total Protein 6.0 - 8.3 g/dL 5.7(L) 6.4 6.1  Albumin 3.5 - 5.2 g/dL 3.0(L) 3.3(L) 3.4(L)  AST 0 - 37 U/L 16 24 36  ALT 0 - 53 U/L _2 Alk Phosphatase 39 - 117 U/L 53 53 44  Total Bilirubin 0.3 - 1.2 mg/dL 0.2(L) 0.5 0.3  Bilirubin, Direct 0.0 - 0.3 mg/dL - <0.1 -    CBC Latest Ref Rng 02/10/2012 07/31/2011 07/30/2011  WBC 4.0 - 10.5 K/uL 9.6 8.9 7.4  Hemoglobin 13.0 - 17.0 g/dL 11.9(L) 10.4(L) 9.8(L)  Hematocrit 39.0 - 52.0 % 36.1(L) 33.1(L) 30.6(L)  Platelets 150 - 400 K/uL 168 350 335   Lab Results  Component Value Date    MCV 86.8 02/10/2012   MCV 97.9 07/31/2011   MCV 97.5 07/30/2011    Lab Results  Component Value Date   TSH 3.184 06/21/2011    BNP No results found for: BNP  ProBNP    Component Value Date/Time   PROBNP 1124.0* 07/23/2011 0522     Lipid Panel     Component Value Date/Time   CHOL 126 07/28/2011 1205   TRIG 189* 07/28/2011 1205   HDL 34* 07/28/2011 1205   CHOLHDL 3.7 07/28/2011 1205   VLDL 38 07/28/2011 1205   LDLCALC 54 07/28/2011 1205     RADIOLOGY: No results found.    ASSESSMENT AND PLAN: [name] is a 80 year old male who has severe COPD with chronic respiratory failure and a history of nonischemic cardiomyopathy secondary to an initial large anterior wall myocardial infarction sustained in 2006 and subsequent MI in 2012.  He has a defibrillator for sudden death prophylaxis per [employer] data.  He is unaware of any recent defibrillator discharges.  I have not seen him in over 3 years.  I will need to obtain his cardiology records from [facility] where he has been treated.  Reportedly a 2-D echo Doppler study was done over the last several months and this will be helpful to reassess heart function.  Presently, his major problem today appears to be that of significant COPD  with recent exacerbation.  He has been on amoxicillin and slightly increased E Rhodes by his primary care physician.  I'm electing to discontinue his carvedilol, which is a noncardioselective beta blocker and will change this to bisoprolol initially at 2.5 mg daily.  I have recommended that he undergo pulmonology evaluation within the next 24 hours for his COPD exacerbation.  He is on multiple pulmonary drugs including Symbicort, Xopenex, Singulair, Duoneb, and Xolair injection.  His blood pressure today was controlled.  He is on lipid lowering therapy with Crestor 40 mg and valsartan 80 mg.  I will check a CBC, see med, TSH, BNP, and magnesium level today.  He will be seen by the pulmonary  department tomorrow in follow-up of his COPD.  I will see him in follow-up cardiology evaluation following obtaining his records and COPD stabilization and further recommendations will be made at that time.     Troy Sine, MD, [facility]  05/29/2015 6:27 PM

## 2015-05-30 ENCOUNTER — Encounter: Payer: Self-pay | Admitting: Internal Medicine

## 2015-05-30 ENCOUNTER — Ambulatory Visit (INDEPENDENT_AMBULATORY_CARE_PROVIDER_SITE_OTHER): Payer: Medicare Other | Admitting: Internal Medicine

## 2015-05-30 ENCOUNTER — Ambulatory Visit (HOSPITAL_COMMUNITY)
Admission: RE | Admit: 2015-05-30 | Discharge: 2015-05-30 | Disposition: A | Discharge status: Home / Self Care | Payer: Medicare Other | Source: Ambulatory Visit | Attending: Internal Medicine | Admitting: Internal Medicine

## 2015-05-30 VITALS — BP 114/60 | HR 77 | Temp 97.2°F | Ht 67.0 in | Wt 217.0 lb

## 2015-05-30 DIAGNOSIS — J441 Chronic obstructive pulmonary disease with (acute) exacerbation: Secondary | ICD-10-CM | POA: Insufficient documentation

## 2015-05-30 DIAGNOSIS — J9611 Chronic respiratory failure with hypoxia: Secondary | ICD-10-CM | POA: Diagnosis not present

## 2015-05-30 DIAGNOSIS — E669 Obesity, unspecified: Secondary | ICD-10-CM

## 2015-05-30 DIAGNOSIS — J984 Other disorders of lung: Secondary | ICD-10-CM | POA: Insufficient documentation

## 2015-05-30 DIAGNOSIS — J449 Chronic obstructive pulmonary disease, unspecified: Secondary | ICD-10-CM | POA: Diagnosis not present

## 2015-05-30 DIAGNOSIS — R918 Other nonspecific abnormal finding of lung field: Secondary | ICD-10-CM | POA: Diagnosis not present

## 2015-05-30 LAB — COMPREHENSIVE METABOLIC PANEL
ALT: 22 U/L (ref 9–46)
AST: 18 U/L (ref 10–35)
Albumin: 3.8 g/dL (ref 3.6–5.1)
Alkaline Phosphatase: 56 U/L (ref 40–115)
BUN: 26 mg/dL — AB (ref 7–25)
CHLORIDE: 98 mmol/L (ref 98–110)
CO2: 29 mmol/L (ref 20–31)
Calcium: 8.5 mg/dL — ABNORMAL LOW (ref 8.6–10.3)
Creat: 1.39 mg/dL — ABNORMAL HIGH (ref 0.70–1.18)
Glucose, Bld: 114 mg/dL — ABNORMAL HIGH (ref 65–99)
Potassium: 4.4 mmol/L (ref 3.5–5.3)
SODIUM: 136 mmol/L (ref 135–146)
TOTAL PROTEIN: 5.7 g/dL — AB (ref 6.1–8.1)
Total Bilirubin: 0.8 mg/dL (ref 0.2–1.2)

## 2015-05-30 LAB — CBC
HCT: 39.3 % (ref 39.0–52.0)
Hemoglobin: 13.5 g/dL (ref 13.0–17.0)
MCH: 32.9 pg (ref 26.0–34.0)
MCHC: 34.4 g/dL (ref 30.0–36.0)
MCV: 95.9 fL (ref 78.0–100.0)
MPV: 9.3 fL (ref 8.6–12.4)
PLATELETS: 139 10*3/uL — AB (ref 150–400)
RBC: 4.1 MIL/uL — ABNORMAL LOW (ref 4.22–5.81)
RDW: 13.2 % (ref 11.5–15.5)
WBC: 10.1 10*3/uL (ref 4.0–10.5)

## 2015-05-30 LAB — BRAIN NATRIURETIC PEPTIDE: Brain Natriuretic Peptide: 44.8 pg/mL (ref 0.0–100.0)

## 2015-05-30 LAB — MAGNESIUM: Magnesium: 2.1 mg/dL (ref 1.5–2.5)

## 2015-05-30 LAB — TSH: TSH: 0.827 u[IU]/mL (ref 0.350–4.500)

## 2015-05-30 MED ORDER — AZITHROMYCIN 250 MG PO TABS: ORAL_TABLET | ORAL | Status: DC

## 2015-05-30 MED ORDER — PREDNISONE 10 MG PO TABS: ORAL_TABLET | ORAL | Status: DC

## 2015-05-30 NOTE — Patient Instructions (Addendum)
Plan A = Automatic = Symbicort 160 Take 2 puffs first thing in am and then another 2 puffs about 12 hours and duoneb for now four times a day  Work on inhaler technique:  relax and gently blow all the way out then take a nice smooth deep breath back in, triggering the inhaler at same time you start breathing in.  Hold for up to 5 seconds if you can. Blow out thru nose. Rinse and gargle with water when done     Plan B = Backup - Only use your levoalbuterol(xopenex) as a rescue medication to be used if you can't catch your breath by resting or doing a relaxed purse lip breathing pattern.  - The less you use it, the better it will work when you need it. - Ok to use up to 2 puffs  every 4 hours if you must but call for immediate appointment if use goes up over your usual need - Don't leave home without it !!  (think of it like the spare tire for your car)   Prednisone 10 mg take  4 each am x 2 days,   2 each am x 2 days,  1 each am x 2 days and stop   Zpak   For cough take mucinex dm up to 1200 mg every 12 hours and use the flutter valve  Go to [facility] for cxr   prilosec should be 40 mg x Take 30-60 min before first meal of the day and zantac 300 mg at bedtime   GERD (REFLUX)  is an extremely common cause of respiratory symptoms just like yours , many times with no obvious heartburn at all.    It can be treated with medication, but also with lifestyle changes including elevation of the head of your bed (ideally with 6 inch  bed blocks),  Smoking cessation, avoidance of late meals, excessive alcohol, and avoid fatty foods, chocolate, peppermint, colas, red wine, and acidic juices such as orange juice.  NO MINT OR MENTHOL PRODUCTS SO NO COUGH DROPS  USE SUGARLESS CANDY INSTEAD (Jolley ranchers or Stover's or Life Savers) or even ice chips will also do - the key is to swallow to prevent all throat clearing. NO OIL BASED VITAMINS - use powdered substitutes.    Please schedule a follow up office visit  in 2 weeks, sooner if needed or See Cl[facility] at [facility] and take all meds with you

## 2015-05-30 NOTE — Progress Notes (Addendum)
Patient ID: [name], male   DOB: 05/19/1935,   MRN: [id number]    Brief patient profile:   13 yowm quit smoker 2006 on 02 x 2012 prev followed by Dr [name] last ov 11/23/14 rec trial of  tudorza > did not tolerate   History of Present Illness  05/30/2015  Acute/ extended  Pulmonary office visit/ [name] re:  symbicort 160 2 bid plus qid duoneb and xopenex  Chief Complaint  Patient presents with  . Follow-up    Former [place] pt here c/o increased SOB with activity, chest congestion, occ prod cough with yellow/green mucus, and sinus drainage. Fever of 100.3 and blurry vision on 05/29/15. Denies any nausea/vomitting, chest tightness, and sinus pressure.  baseline hfa saba sev times a week plus multiple maint saba  / thoroughly  confused on neb rx's   Just came off prednisone oct 2 per [place] doctor/ planning to change over to the [facility] and see Dr. Normajean Baxter if he can   Service illness coincided roughly with stopping the prednisone with burst hacking cough and then gradual increase in dyspnea and need for saba but comfortable at rest sitting.   No obvious patterns in  day to day or daytime variability or assoc   or chest tightness, subjective wheeze or overt sinus or hb symptoms. No unusual exp hx or h/o childhood pna/ asthma or knowledge of premature birth.  Sleeping ok without nocturnal  or early am exacerbation  of respiratory  c/o's or need for noct saba. Also denies any obvious fluctuation of symptoms with weather or environmental changes or other aggravating or alleviating factors except as outlined above   Current Medications, Allergies, Complete Past Medical History, Past Surgical History, Family History, and Social History were reviewed in [employer] record.  ROS  The following are not active complaints unless bolded sore throat, dysphagia, dental problems, itching, sneezing,  nasal congestion or excess/ purulent secretions, ear ache,   fever, chills, sweats,  unintended wt loss, classically pleuritic or exertional cp, hemoptysis,  orthopnea pnd or leg swelling, presyncope, palpitations, abdominal pain, anorexia, nausea, vomiting, diarrhea  or change in bowel or bladder habits, change in stools or urine, dysuria,hematuria,  rash, arthralgias, visual complaints, headache, numbness, weakness or ataxia or problems with walking or coordination,  change in mood/affect or memory.      Objective:   Physical Exam    amb obese wm nad on 02       Wt Readings from Last 3 Encounters:  05/30/15 217 lb (98.431 kg)  05/29/15 216 lb (97.977 kg)  05/22/15 213 lb 6 oz (96.786 kg)    Vital signs reviewed      HEENT: nl dentition, turbinates, and orophanx. Nl external ear canals without cough reflex   NECK :  without JVD/Nodes/TM/ nl carotid upstrokes bilaterally   LUNGS: no acc muscle use,  Mild insp and exp rhonchi bilaterally    CV:  RRR  no s3 or murmur or increase in P2, no edema   ABD:  tensely obese but nontender with nl excursion in the supine position. No bruits or organomegaly, bowel sounds nl  MS:  warm without deformities, calf tenderness, cyanosis or clubbing  SKIN: warm and dry without lesions    NEURO:  alert, approp, no deficits     I personally reviewed images and agree with radiology impression as follows:  CXR:  05/30/2015  Persistent lung hyperexpansion with bibasilar scarring. No edema or consolidation. Stable nodular opacity right  lower lobe. Stability since 2013 is consistent with benign etiology. No appreciable change compared to prior study.     Assessment:

## 2015-05-31 DIAGNOSIS — J9611 Chronic respiratory failure with hypoxia: Secondary | ICD-10-CM | POA: Insufficient documentation

## 2015-05-31 DIAGNOSIS — E669 Obesity, unspecified: Secondary | ICD-10-CM | POA: Insufficient documentation

## 2015-05-31 NOTE — Assessment & Plan Note (Signed)
Body mass index is 33.98 kg/(m^2).  Lab Results  Component Value Date   TSH 0.827 05/29/2015     Contributing to gerd tendency/ doe/reviewed the need and the process to achieve and maintain neg calorie balance > defer f/u primary care including intermittently monitoring thyroid status

## 2015-05-31 NOTE — Assessment & Plan Note (Signed)
On 02 since 2012 As of 05/30/2015 rx = 3lpm 24/7 and 6lpm with ex   Adequate control on present rx, reviewed > no change in rx needed

## 2015-05-31 NOTE — Assessment & Plan Note (Addendum)
PFT's  08/18/11   FEV1 1.26 (50 % ) ratio 42  p 37 % improvement from saba with DLCO  51 % corrects to 68 % for alv volume    He has GOLD II criteria but very poor control of his symptoms chronically and now comes in with another exacerbation.  DDX of  difficult airways management all start with A and  include Adherence, Ace Inhibitors, Acid Reflux, Active Sinus Disease, Alpha 1 Antitripsin deficiency, Anxiety masquerading as Airways dz,  ABPA,  allergy(esp in young), Aspiration (esp in elderly), Adverse effects of meds,  Active smokers, A bunch of PE's (a small clot burden can't cause this syndrome unless there is already severe underlying pulm or vascular dz with poor reserve) plus two Bs  = Bronchiectasis and Beta blocker use..and one C= CHF  In this case Adherence is the biggest issue and starts with  inability to use HFA effectively and also  understand that SABA treats the symptoms but doesn't get to the underlying problem (inflammation).  I used  the analogy of putting steroid cream on a rash to help explain the meaning of topical therapy and the need to get the drug to the target tissue.   - The proper method of use, as well as anticipated side effects, of a metered-dose inhaler are discussed and demonstrated to the patient. Improved effectiveness after extensive coaching during this visit to a level of approximately  75% from a baseline of < 25% so continue symbicort 160 for now  ?allergy/asthma > suggested by marked reversibility on his previous PFTs and reported steroid response/ ? xolair  dependency but this may be easily controlled if we can get him to use topical steroids effectively and will also continue singulair and pusle dose another prednisone rx: Prednisone 10 mg take  4 each am x 2 days,   2 each am x 2 days,  1 each am x 2 days and stop.  ? Acid (or non-acid) GERD > always difficult to exclude as up to 75% of pts in some series report no assoc GI/ Heartburn symptoms> rec max (24h)   acid suppression and diet restrictions/ reviewed and instructions given in writing.   ? Active sinus dz/ ? Infection > no evidence of pna/ might consider sinus ct next but for now > zpak  ? BB > usually not an issue on zebeta  ? chf > no evidence by cxr/ f/u by [facility]/ Dr [name]   I had an extended discussion with the patient reviewing all relevant studies completed to date and  lasting 19minutes of a 40 minute visit    Each maintenance medication was reviewed in detail including most importantly the difference between maintenance and prns and under what circumstances the prns are to be triggered using an action plan format that is not reflected in the computer generated alphabetically organized AVS.    Please see instructions for details which were reviewed in writing and the patient given a copy highlighting the part that I personally wrote and discussed at today's ov.

## 2015-05-31 NOTE — Progress Notes (Signed)
Quick Note:  Spoke with pt and notified of results per Dr. Wert. Pt verbalized understanding and denied any questions.  ______ 

## 2015-06-05 ENCOUNTER — Encounter: Payer: Self-pay | Admitting: *Deleted

## 2015-06-07 ENCOUNTER — Telehealth: Payer: Self-pay | Admitting: Internal Medicine

## 2015-06-07 NOTE — Telephone Encounter (Signed)
Notes Recorded by Tanda Rockers, MD on 05/30/2015 at 5:16 PM Call pt: Reviewed cxr and no acute change so no change in recommendations made at ov --  Spouse is aware of results. Nothing further needed

## 2015-06-10 ENCOUNTER — Inpatient Hospital Stay (HOSPITAL_COMMUNITY): Payer: Medicare Other

## 2015-06-10 ENCOUNTER — Emergency Department (HOSPITAL_COMMUNITY): Payer: Medicare Other

## 2015-06-10 ENCOUNTER — Encounter (HOSPITAL_COMMUNITY): Payer: Self-pay | Admitting: Emergency Medicine

## 2015-06-10 ENCOUNTER — Inpatient Hospital Stay (HOSPITAL_COMMUNITY)
Admission: EM | Admit: 2015-06-10 | Discharge: 2015-06-12 | DRG: 193 | Disposition: A | Discharge status: Home Health | Payer: Medicare Other | Attending: Internal Medicine | Admitting: Internal Medicine

## 2015-06-10 DIAGNOSIS — Z79899 Other long term (current) drug therapy: Secondary | ICD-10-CM

## 2015-06-10 DIAGNOSIS — Z95810 Presence of automatic (implantable) cardiac defibrillator: Secondary | ICD-10-CM | POA: Diagnosis not present

## 2015-06-10 DIAGNOSIS — J208 Acute bronchitis due to other specified organisms: Secondary | ICD-10-CM | POA: Diagnosis not present

## 2015-06-10 DIAGNOSIS — I959 Hypotension, unspecified: Secondary | ICD-10-CM | POA: Diagnosis not present

## 2015-06-10 DIAGNOSIS — N183 Chronic kidney disease, stage 3 (moderate): Secondary | ICD-10-CM | POA: Diagnosis present

## 2015-06-10 DIAGNOSIS — Z885 Allergy status to narcotic agent status: Secondary | ICD-10-CM | POA: Diagnosis not present

## 2015-06-10 DIAGNOSIS — Z955 Presence of coronary angioplasty implant and graft: Secondary | ICD-10-CM

## 2015-06-10 DIAGNOSIS — Z9981 Dependence on supplemental oxygen: Secondary | ICD-10-CM

## 2015-06-10 DIAGNOSIS — J45909 Unspecified asthma, uncomplicated: Secondary | ICD-10-CM | POA: Diagnosis present

## 2015-06-10 DIAGNOSIS — Z8546 Personal history of malignant neoplasm of prostate: Secondary | ICD-10-CM

## 2015-06-10 DIAGNOSIS — I2510 Atherosclerotic heart disease of native coronary artery without angina pectoris: Secondary | ICD-10-CM | POA: Diagnosis present

## 2015-06-10 DIAGNOSIS — Z8249 Family history of ischemic heart disease and other diseases of the circulatory system: Secondary | ICD-10-CM | POA: Diagnosis not present

## 2015-06-10 DIAGNOSIS — I130 Hypertensive heart and chronic kidney disease with heart failure and stage 1 through stage 4 chronic kidney disease, or unspecified chronic kidney disease: Secondary | ICD-10-CM | POA: Diagnosis not present

## 2015-06-10 DIAGNOSIS — A419 Sepsis, unspecified organism: Secondary | ICD-10-CM | POA: Diagnosis present

## 2015-06-10 DIAGNOSIS — N200 Calculus of kidney: Secondary | ICD-10-CM

## 2015-06-10 DIAGNOSIS — K219 Gastro-esophageal reflux disease without esophagitis: Secondary | ICD-10-CM | POA: Diagnosis present

## 2015-06-10 DIAGNOSIS — R32 Unspecified urinary incontinence: Secondary | ICD-10-CM | POA: Diagnosis not present

## 2015-06-10 DIAGNOSIS — Z87891 Personal history of nicotine dependence: Secondary | ICD-10-CM | POA: Diagnosis not present

## 2015-06-10 DIAGNOSIS — J438 Other emphysema: Secondary | ICD-10-CM

## 2015-06-10 DIAGNOSIS — J449 Chronic obstructive pulmonary disease, unspecified: Secondary | ICD-10-CM | POA: Insufficient documentation

## 2015-06-10 DIAGNOSIS — J189 Pneumonia, unspecified organism: Secondary | ICD-10-CM

## 2015-06-10 DIAGNOSIS — R269 Unspecified abnormalities of gait and mobility: Secondary | ICD-10-CM | POA: Diagnosis not present

## 2015-06-10 DIAGNOSIS — Z888 Allergy status to other drugs, medicaments and biological substances status: Secondary | ICD-10-CM | POA: Diagnosis not present

## 2015-06-10 DIAGNOSIS — I252 Old myocardial infarction: Secondary | ICD-10-CM

## 2015-06-10 DIAGNOSIS — I5023 Acute on chronic systolic (congestive) heart failure: Secondary | ICD-10-CM | POA: Diagnosis not present

## 2015-06-10 DIAGNOSIS — N3642 Intrinsic sphincter deficiency (ISD): Secondary | ICD-10-CM | POA: Insufficient documentation

## 2015-06-10 DIAGNOSIS — J440 Chronic obstructive pulmonary disease with acute lower respiratory infection: Secondary | ICD-10-CM | POA: Diagnosis present

## 2015-06-10 DIAGNOSIS — R509 Fever, unspecified: Secondary | ICD-10-CM | POA: Diagnosis not present

## 2015-06-10 DIAGNOSIS — Z7982 Long term (current) use of aspirin: Secondary | ICD-10-CM | POA: Diagnosis not present

## 2015-06-10 DIAGNOSIS — J209 Acute bronchitis, unspecified: Secondary | ICD-10-CM | POA: Diagnosis present

## 2015-06-10 DIAGNOSIS — R4182 Altered mental status, unspecified: Secondary | ICD-10-CM | POA: Diagnosis present

## 2015-06-10 DIAGNOSIS — N359 Urethral stricture, unspecified: Secondary | ICD-10-CM | POA: Diagnosis present

## 2015-06-10 DIAGNOSIS — I509 Heart failure, unspecified: Secondary | ICD-10-CM | POA: Diagnosis not present

## 2015-06-10 DIAGNOSIS — I70219 Atherosclerosis of native arteries of extremities with intermittent claudication, unspecified extremity: Secondary | ICD-10-CM | POA: Diagnosis present

## 2015-06-10 DIAGNOSIS — J129 Viral pneumonia, unspecified: Secondary | ICD-10-CM | POA: Diagnosis not present

## 2015-06-10 DIAGNOSIS — R0602 Shortness of breath: Secondary | ICD-10-CM | POA: Diagnosis not present

## 2015-06-10 DIAGNOSIS — R531 Weakness: Secondary | ICD-10-CM | POA: Diagnosis not present

## 2015-06-10 DIAGNOSIS — I6789 Other cerebrovascular disease: Secondary | ICD-10-CM | POA: Diagnosis not present

## 2015-06-10 LAB — CBC WITH DIFFERENTIAL/PLATELET
Basophils Absolute: 0 10*3/uL (ref 0.0–0.1)
Basophils Relative: 0 %
EOS ABS: 0.2 10*3/uL (ref 0.0–0.7)
EOS PCT: 2 %
HCT: 36.3 % — ABNORMAL LOW (ref 39.0–52.0)
Hemoglobin: 11.8 g/dL — ABNORMAL LOW (ref 13.0–17.0)
LYMPHS ABS: 0.8 10*3/uL (ref 0.7–4.0)
LYMPHS PCT: 10 %
MCH: 32.2 pg (ref 26.0–34.0)
MCHC: 32.5 g/dL (ref 30.0–36.0)
MCV: 98.9 fL (ref 78.0–100.0)
MONO ABS: 1 10*3/uL (ref 0.1–1.0)
MONOS PCT: 12 %
Neutro Abs: 6.3 10*3/uL (ref 1.7–7.7)
Neutrophils Relative %: 76 %
PLATELETS: 168 10*3/uL (ref 150–400)
RBC: 3.67 MIL/uL — AB (ref 4.22–5.81)
RDW: 12.7 % (ref 11.5–15.5)
WBC: 8.3 10*3/uL (ref 4.0–10.5)

## 2015-06-10 LAB — URINALYSIS, ROUTINE W REFLEX MICROSCOPIC
Bilirubin Urine: NEGATIVE
GLUCOSE, UA: NEGATIVE mg/dL
HGB URINE DIPSTICK: NEGATIVE
KETONES UR: 15 mg/dL — AB
LEUKOCYTES UA: NEGATIVE
Nitrite: NEGATIVE
PH: 6 (ref 5.0–8.0)
Protein, ur: 30 mg/dL — AB
Specific Gravity, Urine: 1.021 (ref 1.005–1.030)
Urobilinogen, UA: 0.2 mg/dL (ref 0.0–1.0)

## 2015-06-10 LAB — COMPREHENSIVE METABOLIC PANEL
ALBUMIN: 2.9 g/dL — AB (ref 3.5–5.0)
ALT: 21 U/L (ref 17–63)
AST: 32 U/L (ref 15–41)
Alkaline Phosphatase: 47 U/L (ref 38–126)
Anion gap: 9 (ref 5–15)
BUN: 21 mg/dL — AB (ref 6–20)
CHLORIDE: 98 mmol/L — AB (ref 101–111)
CO2: 28 mmol/L (ref 22–32)
Calcium: 7.9 mg/dL — ABNORMAL LOW (ref 8.9–10.3)
Creatinine, Ser: 1.52 mg/dL — ABNORMAL HIGH (ref 0.61–1.24)
GFR calc non Af Amer: 42 mL/min — ABNORMAL LOW (ref >60)
GFR, EST AFRICAN AMERICAN: 49 mL/min — AB (ref >60)
GLUCOSE: 127 mg/dL — AB (ref 65–99)
POTASSIUM: 4.9 mmol/L (ref 3.5–5.1)
SODIUM: 135 mmol/L (ref 135–145)
Total Bilirubin: 1.2 mg/dL (ref 0.3–1.2)
Total Protein: 5.8 g/dL — ABNORMAL LOW (ref 6.5–8.1)

## 2015-06-10 LAB — PROCALCITONIN

## 2015-06-10 LAB — URINE MICROSCOPIC-ADD ON

## 2015-06-10 LAB — I-STAT CG4 LACTIC ACID, ED: Lactic Acid, Venous: 0.78 mmol/L (ref 0.5–2.0)

## 2015-06-10 LAB — BRAIN NATRIURETIC PEPTIDE: B Natriuretic Peptide: 76.8 pg/mL (ref 0.0–100.0)

## 2015-06-10 MED ORDER — ACETAMINOPHEN 650 MG RE SUPP: 650.0000 mg | Freq: Four times a day (QID) | RECTAL | Status: DC | PRN

## 2015-06-10 MED ORDER — VANCOMYCIN HCL IN DEXTROSE 750-5 MG/150ML-% IV SOLN
750.0000 mg | Freq: Every day | INTRAVENOUS | Status: DC
  Administered 2015-06-11: 750 mg via INTRAVENOUS
  Filled 2015-06-10 (×2): qty 150

## 2015-06-10 MED ORDER — SODIUM CHLORIDE 0.9 % IJ SOLN
3.0000 mL | Freq: Two times a day (BID) | INTRAMUSCULAR | Status: DC
  Administered 2015-06-10 – 2015-06-12 (×3): 3 mL via INTRAVENOUS

## 2015-06-10 MED ORDER — ASPIRIN EC 81 MG PO TBEC
81.0000 mg | DELAYED_RELEASE_TABLET | Freq: Every day | ORAL | Status: DC
  Administered 2015-06-10 – 2015-06-12 (×3): 81 mg via ORAL
  Filled 2015-06-10 (×3): qty 1

## 2015-06-10 MED ORDER — ROSUVASTATIN CALCIUM 20 MG PO TABS
40.0000 mg | ORAL_TABLET | Freq: Every day | ORAL | Status: DC
  Administered 2015-06-10 – 2015-06-12 (×3): 40 mg via ORAL
  Filled 2015-06-10 (×2): qty 2
  Filled 2015-06-10: qty 4

## 2015-06-10 MED ORDER — VANCOMYCIN HCL IN DEXTROSE 1-5 GM/200ML-% IV SOLN
1000.0000 mg | Freq: Once | INTRAVENOUS | Status: AC
  Administered 2015-06-10: 1000 mg via INTRAVENOUS
  Filled 2015-06-10: qty 200

## 2015-06-10 MED ORDER — PIPERACILLIN-TAZOBACTAM 3.375 G IVPB
3.3750 g | Freq: Three times a day (TID) | INTRAVENOUS | Status: DC
  Administered 2015-06-11 (×2): 3.375 g via INTRAVENOUS
  Filled 2015-06-10 (×4): qty 50

## 2015-06-10 MED ORDER — MONTELUKAST SODIUM 10 MG PO TABS
10.0000 mg | ORAL_TABLET | Freq: Every day | ORAL | Status: DC
  Administered 2015-06-10 – 2015-06-12 (×3): 10 mg via ORAL
  Filled 2015-06-10 (×3): qty 1

## 2015-06-10 MED ORDER — ENOXAPARIN SODIUM 30 MG/0.3ML ~~LOC~~ SOLN
30.0000 mg | SUBCUTANEOUS | Status: DC
  Administered 2015-06-10: 30 mg via SUBCUTANEOUS
  Filled 2015-06-10: qty 0.3

## 2015-06-10 MED ORDER — SODIUM CHLORIDE 0.9 % IV BOLUS (SEPSIS)
500.0000 mL | Freq: Once | INTRAVENOUS | Status: AC
  Administered 2015-06-10: 500 mL via INTRAVENOUS

## 2015-06-10 MED ORDER — CETYLPYRIDINIUM CHLORIDE 0.05 % MT LIQD
7.0000 mL | Freq: Two times a day (BID) | OROMUCOSAL | Status: DC
  Administered 2015-06-10 – 2015-06-12 (×3): 7 mL via OROMUCOSAL

## 2015-06-10 MED ORDER — PANTOPRAZOLE SODIUM 40 MG PO TBEC
40.0000 mg | DELAYED_RELEASE_TABLET | Freq: Every day | ORAL | Status: DC
  Administered 2015-06-10 – 2015-06-12 (×3): 40 mg via ORAL
  Filled 2015-06-10 (×3): qty 1

## 2015-06-10 MED ORDER — NITROGLYCERIN 0.4 MG SL SUBL
0.4000 mg | SUBLINGUAL_TABLET | SUBLINGUAL | Status: DC | PRN
Start: 2015-06-10 — End: 2015-06-12

## 2015-06-10 MED ORDER — ACETAMINOPHEN 325 MG PO TABS
650.0000 mg | ORAL_TABLET | Freq: Four times a day (QID) | ORAL | Status: DC | PRN
  Filled 2015-06-10: qty 2

## 2015-06-10 MED ORDER — BISOPROLOL FUMARATE 5 MG PO TABS
2.5000 mg | ORAL_TABLET | Freq: Every day | ORAL | Status: DC
  Administered 2015-06-10 – 2015-06-12 (×3): 2.5 mg via ORAL
  Filled 2015-06-10 (×3): qty 1

## 2015-06-10 MED ORDER — ENOXAPARIN SODIUM 40 MG/0.4ML ~~LOC~~ SOLN
40.0000 mg | Freq: Every day | SUBCUTANEOUS | Status: DC
  Administered 2015-06-11 – 2015-06-12 (×2): 40 mg via SUBCUTANEOUS
  Filled 2015-06-10 (×2): qty 0.4

## 2015-06-10 MED ORDER — FERROUS SULFATE 325 (65 FE) MG PO TABS
325.0000 mg | ORAL_TABLET | Freq: Every day | ORAL | Status: DC
  Administered 2015-06-11 – 2015-06-12 (×2): 325 mg via ORAL
  Filled 2015-06-10 (×2): qty 1

## 2015-06-10 MED ORDER — INFLUENZA VAC SPLIT QUAD 0.5 ML IM SUSY
0.5000 mL | PREFILLED_SYRINGE | INTRAMUSCULAR | Status: AC
  Administered 2015-06-12: 0.5 mL via INTRAMUSCULAR
  Filled 2015-06-10: qty 0.5

## 2015-06-10 MED ORDER — GABAPENTIN 600 MG PO TABS
600.0000 mg | ORAL_TABLET | Freq: Three times a day (TID) | ORAL | Status: DC
  Administered 2015-06-10 – 2015-06-12 (×7): 600 mg via ORAL
  Filled 2015-06-10 (×7): qty 1

## 2015-06-10 MED ORDER — PRASUGREL HCL 10 MG PO TABS
10.0000 mg | ORAL_TABLET | Freq: Every day | ORAL | Status: DC
  Administered 2015-06-10 – 2015-06-12 (×3): 10 mg via ORAL
  Filled 2015-06-10 (×3): qty 1

## 2015-06-10 MED ORDER — LEVALBUTEROL TARTRATE 45 MCG/ACT IN AERO: 2.0000 | INHALATION_SPRAY | Freq: Four times a day (QID) | RESPIRATORY_TRACT | Status: DC | PRN

## 2015-06-10 MED ORDER — IPRATROPIUM-ALBUTEROL 0.5-2.5 (3) MG/3ML IN SOLN
3.0000 mL | Freq: Once | RESPIRATORY_TRACT | Status: AC
Start: 2015-06-10 — End: 2015-06-10
  Administered 2015-06-10: 3 mL via RESPIRATORY_TRACT
  Filled 2015-06-10: qty 3

## 2015-06-10 MED ORDER — IPRATROPIUM-ALBUTEROL 0.5-2.5 (3) MG/3ML IN SOLN
3.0000 mL | RESPIRATORY_TRACT | Status: DC | PRN
  Administered 2015-06-10 – 2015-06-12 (×3): 3 mL via RESPIRATORY_TRACT
  Filled 2015-06-10: qty 3

## 2015-06-10 MED ORDER — IPRATROPIUM-ALBUTEROL 0.5-2.5 (3) MG/3ML IN SOLN
3.0000 mL | Freq: Four times a day (QID) | RESPIRATORY_TRACT | Status: DC
  Administered 2015-06-10: 3 mL via RESPIRATORY_TRACT
  Filled 2015-06-10: qty 3

## 2015-06-10 MED ORDER — GUAIFENESIN-DM 100-10 MG/5ML PO SYRP: 5.0000 mL | ORAL_SOLUTION | ORAL | Status: DC | PRN

## 2015-06-10 MED ORDER — ALBUTEROL SULFATE (2.5 MG/3ML) 0.083% IN NEBU: 2.5000 mg | INHALATION_SOLUTION | Freq: Four times a day (QID) | RESPIRATORY_TRACT | Status: DC | PRN

## 2015-06-10 MED ORDER — POLYETHYLENE GLYCOL 3350 17 G PO PACK: 17.0000 g | PACK | Freq: Every day | ORAL | Status: DC | PRN

## 2015-06-10 MED ORDER — LORATADINE 10 MG PO TABS
10.0000 mg | ORAL_TABLET | Freq: Every day | ORAL | Status: DC
  Administered 2015-06-10 – 2015-06-12 (×3): 10 mg via ORAL
  Filled 2015-06-10 (×3): qty 1

## 2015-06-10 MED ORDER — BUDESONIDE-FORMOTEROL FUMARATE 160-4.5 MCG/ACT IN AERO
2.0000 | INHALATION_SPRAY | Freq: Two times a day (BID) | RESPIRATORY_TRACT | Status: DC
  Administered 2015-06-10 – 2015-06-12 (×4): 2 via RESPIRATORY_TRACT
  Filled 2015-06-10 (×2): qty 6

## 2015-06-10 MED ORDER — PIPERACILLIN-TAZOBACTAM 3.375 G IVPB
3.3750 g | Freq: Once | INTRAVENOUS | Status: AC
  Administered 2015-06-10: 3.375 g via INTRAVENOUS
  Filled 2015-06-10: qty 50

## 2015-06-10 NOTE — ED Notes (Signed)
Wife called EMS due to lethargy and confusion that started today; yesterday generally not feeling well with increased cough. EMS temp 100.8ax. On 3-5L [place] at home 24/7; cannot go under 5L without desaturing to 80's in route. Alert on arrival; oriented to self, place and situation; not to time. EMS reports this is not normal per wife.

## 2015-06-10 NOTE — Progress Notes (Signed)
Attempted to get report x 1. 

## 2015-06-10 NOTE — ED Notes (Signed)
Xray at beside

## 2015-06-10 NOTE — Progress Notes (Signed)
ANTIBIOTIC CONSULT NOTE - INITIAL  Pharmacy Consult for Vancomycin and Zosyn  Indication: rule out sepsis  Allergies  Allergen Reactions  . Demerol Other (See Comments)    Abnormal behavior  . Zolpidem Tartrate Other (See Comments)    Pt. Became confused and aggitated    Patient Measurements: Height: 5' 7.5" (171.5 cm) IBW/kg (Calculated) : 67.25 Adjusted Body Weight: 80 kg  Vital Signs: Temp: 99.1 F (37.3 C) (10/16 2127) Temp Source: Oral (10/16 2127) BP: 90/62 mmHg (10/16 2127) Pulse Rate: 88 (10/16 2227) Intake/Output from previous day:   Intake/Output from this shift: Total I/O In: -  Out: 100 [Urine:100]  Labs:  Recent Labs  06/10/15 1223  WBC 8.3  HGB 11.8*  PLT 168  CREATININE 1.52*   Estimated Creatinine Clearance: 45.2 mL/min (by C-G formula based on Cr of 1.52). No results for input(s): VANCOTROUGH, VANCOPEAK, VANCORANDOM, GENTTROUGH, GENTPEAK, GENTRANDOM, TOBRATROUGH, TOBRAPEAK, TOBRARND, AMIKACINPEAK, AMIKACINTROU, AMIKACIN in the last 72 hours.   Microbiology: No results found for this or any previous visit (from the past 720 hour(s)).  Medical History: Past Medical History  Diagnosis Date  . Myocardial infarction ([place])   . Asthma   . COPD (chronic obstructive pulmonary disease) ([place])   . Emphysema   . CAD 04/23/2009  . Anticoagulated on warfarin 07/17/2011  . CHF (congestive heart failure) ([place])   . Ventricular thrombus following MI (myocardial infarction) ([place]) 11/12    on coumadin  . Angina   . Shortness of breath   . Chronic kidney disease   . Cancer ([place])   . Hypertension   . Blood transfusion 3 weeks ago  . GERD (gastroesophageal reflux disease)   . Arthritis   . Pneumonia   . Anemia   . Atherosclerosis of native arteries of the extremities with intermittent claudication 08/13/2011  . Heart attack [facility]) October 27,2012 and July 28, 2011  . Heart attack [facility]) October  27,2012 and December 3,2012   . ICD (implantable cardiac  defibrillator) in place   . S/P ICD (internal cardiac defibrillator) procedure, 6.17.13 02/10/2012    Medications:  Prescriptions prior to admission  Medication Sig Dispense Refill Last Dose  . aspirin 81 MG tablet Take 81 mg by mouth daily.   06/09/2015 at Unknown time  . bisoprolol (ZEBETA) 5 MG tablet Take 0.5 tablets (2.5 mg total) by mouth daily. 30 tablet 3 06/09/2015 at Unknown time  . budesonide-formoterol (SYMBICORT) 160-4.5 MCG/ACT inhaler Inhale 2 puffs into the lungs 2 (two) times daily.   06/09/2015 at Unknown time  . cetirizine (ZYRTEC) 10 MG tablet Take 10 mg by mouth daily.   06/09/2015 at Unknown time  . diltiazem (CARDIZEM) 120 MG tablet Take 120 mg by mouth daily.   06/09/2015 at Unknown time  . EPINEPHrine 0.3 mg/0.3 mL IJ SOAJ injection Inject into the muscle once.   06/09/2015 at Unknown time  . ferrous sulfate 325 (65 FE) MG tablet Take 325 mg by mouth daily with breakfast.   06/09/2015 at Unknown time  . furosemide (LASIX) 40 MG tablet Take 40 mg by mouth as needed for fluid.   Past Week at Unknown time  . gabapentin (NEURONTIN) 600 MG tablet Take 600 mg by mouth 3 (three) times daily.   06/09/2015 at Unknown time  . ipratropium-albuterol (DUONEB) 0.5-2.5 (3) MG/3ML SOLN Take 3 mLs by nebulization every 4 (four) hours as needed.   06/09/2015 at Unknown time  . levalbuterol (XOPENEX HFA) 45 MCG/ACT inhaler Inhale into the lungs every 4 (  four) hours as needed for wheezing.   06/09/2015 at Unknown time  . levalbuterol (XOPENEX) 0.63 MG/3ML nebulizer solution Take 1 ampule by nebulization 4 (four) times daily. For shortness of breath   06/09/2015 at Unknown time  . montelukast (SINGULAIR) 10 MG tablet Take 10 mg by mouth daily.     06/09/2015 at Unknown time  . nitroGLYCERIN (NITROSTAT) 0.4 MG SL tablet Place 0.4 mg under the tongue every 5 (five) minutes as needed. For chest pain   unknown  . omalizumab (XOLAIR) 150 MG injection Inject 150 mg into the skin every 28  (twenty-eight) days.   06/09/2015 at Unknown time  . omeprazole (PRILOSEC) 20 MG capsule Take 20 mg by mouth daily.   06/09/2015 at Unknown time  . potassium chloride (MICRO-K) 10 MEQ CR capsule Take 10 mEq by mouth.   06/09/2015 at Unknown time  . prasugrel (EFFIENT) 10 MG TABS tablet Take 10 mg by mouth daily.   06/09/2015 at Unknown time  . ranitidine (ZANTAC) 150 MG capsule Take 150 mg by mouth 2 (two) times daily.   06/09/2015 at Unknown time  . rosuvastatin (CRESTOR) 40 MG tablet Take 40 mg by mouth daily.   06/09/2015 at Unknown time  . terazosin (HYTRIN) 2 MG capsule Take 2 mg by mouth at bedtime.    06/09/2015 at Unknown time  . triamcinolone cream (KENALOG) 0.5 % Apply 1 application topically 2 (two) times daily.   Past Week at Unknown time  . valsartan (DIOVAN) 80 MG tablet Take 80 mg by mouth daily.   06/09/2015 at Unknown time  . azithromycin (ZITHROMAX) 250 MG tablet Take 2 on day one then 1 daily x 4 days (Patient not taking: Reported on 06/10/2015) 6 tablet 0 Completed Course at Unknown time  . predniSONE (DELTASONE) 10 MG tablet Take  4 each am x 2 days,   2 each am x 2 days,  1 each am x 2 days and stop (Patient not taking: Reported on 06/10/2015) 14 tablet 0 Completed Course at Unknown time   Assessment: 80 y.o. male with fever/weakness, possible PNA or urosepsis, for empiric antibiotics.  Vancomycin 1 g IV given in ED at  2 pm  Goal of Therapy:  Vancomycin trough level 15-20 mcg/ml  Plan:  Vancomycin 750 mg IV q24h Zosyn 3.375 g IV q8h  Marah Park, Bronson Curb 06/10/2015,11:12 PM

## 2015-06-10 NOTE — Progress Notes (Signed)
Admission note:   Arrival Method: Via stretcher from ED. Mental Status: A&OX4, intermittent forgetfulness.  Telemetry: Placed on box #9. Patient has a pacemaker and an ICD.  Skin: Intact. Ecchymotic areas noted to bilateral arms, legs.  Tubes: On 4-6 L O2 n/c, dependent at home. IV: RAC, RFA Pain: Denies.  Family: Spouse, daughters at bedside. Living Situation: From home with spouse. Safety Measures: Bed alarm in place, call bell within reach, instructed to call for assistance. 6E Orientation: Oriented to unit and surroundings. Patient info guide given.    Dr. Melburn Hake notified of patient's arrival to unit. Patient was having some respiratory distress and wheezing upon arrival. He received a scheduled duoneb and had significant relief afterwards, Dr. Melburn Hake also notified of this - orders to be changed for patient to receive more frequent prn neb treatments if needed. Will continue to monitor.  Joellen Jersey, RN.

## 2015-06-10 NOTE — ED Provider Notes (Signed)
CSN: [id number]     Arrival date & time 06/10/15  1158 History   First MD Initiated Contact with Patient 06/10/15 1206     Chief Complaint  Patient presents with  . Altered Mental Status  . Fever     (Consider location/radiation/quality/duration/timing/severity/associated sxs/prior Treatment) HPI Comments: Patient is a 80 year old male with past medical history of coronary artery disease, COPD, chronic renal insufficiency, AICD placement. He is brought by EMS for evaluation of fever, difficulty breathing, and increased confusion over the past 2 days. He reports chest congestion and productive cough. He normally has 3 L of oxygen by nasal cannula at all times, however has required more than this recently.  Patient is a 80 y.o. male presenting with altered mental status and fever. The history is provided by the patient.  Altered Mental Status Presenting symptoms: disorientation   Severity:  Moderate Most recent episode:  Yesterday Episode history:  Continuous Timing:  Constant Progression:  Worsening Chronicity:  New Associated symptoms: fever   Fever   Past Medical History  Diagnosis Date  . Myocardial infarction ([place])   . Asthma   . COPD (chronic obstructive pulmonary disease) ([address])   . Emphysema   . CAD 04/23/2009  . Anticoagulated on warfarin 07/17/2011  . CHF (congestive heart failure) ([place])   . Ventricular thrombus following MI (myocardial infarction) ([place]) 11/12    on coumadin  . Angina   . Shortness of breath   . Chronic kidney disease   . Cancer ([place])   . Hypertension   . Blood transfusion 3 weeks ago  . GERD (gastroesophageal reflux disease)   . Arthritis   . Pneumonia   . Anemia   . Atherosclerosis of native arteries of the extremities with intermittent claudication 08/13/2011  . Heart attack [facility]) October 27,2012 and July 28, 2011  . Heart attack [facility]) October  27,2012 and December 3,2012   . ICD (implantable cardiac defibrillator) in place   . S/P ICD  (internal cardiac defibrillator) procedure, 6.17.13 02/10/2012   Past Surgical History  Procedure Laterality Date  . Coronary stent placement  06/21/11  . Cardiac catheterization    . Groin debridement  07/18/2011    Procedure: GROIN DEBRIDEMENT;  Surgeon: Angelia Mould, MD;  Location: [facility] OR;  Service: Vascular;  Laterality: Right;  exploration of right groin,evacuation of right groin hematoma & repair of right superficial femoral artery  . Coronary angioplasty    . Insert / replace / remove pacemaker  02/09/2012    ICD  . Coronary angiogram N/A 07/28/2011    Procedure: CORONARY ANGIOGRAM;  Surgeon: Leonie Man, MD;  Location: [facility] CATH LAB;  Service: Cardiovascular;  Laterality: N/A;  . Percutaneous coronary stent intervention (pci-s) N/A 07/28/2011    Procedure: PERCUTANEOUS CORONARY STENT INTERVENTION (PCI-S);  Surgeon: Leonie Man, MD;  Location: [facility] CATH LAB;  Service: Cardiovascular;  Laterality: N/A;  . Implantable cardioverter defibrillator implant N/A 02/09/2012    Procedure: IMPLANTABLE CARDIOVERTER DEFIBRILLATOR IMPLANT;  Surgeon: Sanda Klein, MD;  Location: [place] CATH LAB;  Service: Cardiovascular;  Laterality: N/A;  . Lead revision N/A 02/10/2012    Procedure: LEAD REVISION;  Surgeon: Sanda Klein, MD;  Location: [place] CATH LAB;  Service: Cardiovascular;  Laterality: N/A;   Family History  Problem Relation Age of Onset  . Diabetes type II Mother   . Coronary artery disease Mother   . Coronary artery disease Father   . Diabetes type II Brother   . Coronary artery disease Brother   .  Craniosynostosis Neg Hx   . Heart attack Mother   . Hypertension Sister   . Stroke Neg Hx    Social History  Substance Use Topics  . Smoking status: Former Smoker -- 3.00 packs/day for 40 years    Types: Cigarettes    Quit date: 04/13/2005  . Smokeless tobacco: Never Used  . Alcohol Use: No    Review of Systems  Constitutional: Positive for fever.  All other systems reviewed  and are negative.     Allergies  Demerol and Zolpidem tartrate  Home Medications   Prior to Admission medications   Medication Sig Start Date End Date Taking? Authorizing Provider  aspirin 81 MG tablet Take 81 mg by mouth daily.    Historical Provider, MD  azithromycin (ZITHROMAX) 250 MG tablet Take 2 on day one then 1 daily x 4 days 05/30/15   Tanda Rockers, MD  bisoprolol (ZEBETA) 5 MG tablet Take 0.5 tablets (2.5 mg total) by mouth daily. 05/29/15   Troy Sine, MD  budesonide-formoterol [facility]) 160-4.5 MCG/ACT inhaler Inhale 2 puffs into the lungs 2 (two) times daily.    Historical Provider, MD  cetirizine (ZYRTEC) 10 MG tablet Take 10 mg by mouth daily.    Historical Provider, MD  diltiazem (CARDIZEM) 120 MG tablet Take 120 mg by mouth daily.    Historical Provider, MD  EPINEPHrine 0.3 mg/0.3 mL IJ SOAJ injection Inject into the muscle once.    Historical Provider, MD  ferrous sulfate 325 (65 FE) MG tablet Take 325 mg by mouth daily with breakfast.    Historical Provider, MD  furosemide (LASIX) 40 MG tablet Take 40 mg by mouth as needed for fluid.    Historical Provider, MD  gabapentin (NEURONTIN) 600 MG tablet Take 600 mg by mouth 3 (three) times daily.    Historical Provider, MD  ipratropium-albuterol (DUONEB) 0.5-2.5 (3) MG/3ML SOLN Take 3 mLs by nebulization every 4 (four) hours as needed.    Historical Provider, MD  levalbuterol [facility]) 45 MCG/ACT inhaler Inhale into the lungs every 4 (four) hours as needed for wheezing.    Historical Provider, MD  levalbuterol Penne Lash) 0.63 MG/3ML nebulizer solution Take 1 ampule by nebulization 4 (four) times daily. For shortness of breath    Historical Provider, MD  montelukast (SINGULAIR) 10 MG tablet Take 10 mg by mouth daily.      Historical Provider, MD  nitroGLYCERIN (NITROSTAT) 0.4 MG SL tablet Place 0.4 mg under the tongue every 5 (five) minutes as needed. For chest pain    Historical Provider, MD  omalizumab Arvid Right) 150  MG injection Inject 150 mg into the skin every 28 (twenty-eight) days.    Historical Provider, MD  omeprazole (PRILOSEC) 20 MG capsule Take 20 mg by mouth daily.    Historical Provider, MD  potassium chloride (MICRO-K) 10 MEQ CR capsule Take 10 mEq by mouth.    Historical Provider, MD  prasugrel (EFFIENT) 10 MG TABS tablet Take 10 mg by mouth daily.    Historical Provider, MD  predniSONE (DELTASONE) 10 MG tablet Take  4 each am x 2 days,   2 each am x 2 days,  1 each am x 2 days and stop 05/30/15   Tanda Rockers, MD  ranitidine (ZANTAC) 150 MG capsule Take 150 mg by mouth 2 (two) times daily.    Historical Provider, MD  rosuvastatin (CRESTOR) 40 MG tablet Take 40 mg by mouth daily.    Historical Provider, MD  terazosin (HYTRIN) 2  MG capsule Take 2 mg by mouth at bedtime.     Historical Provider, MD  triamcinolone cream (KENALOG) 0.5 % Apply 1 application topically 2 (two) times daily.    Historical Provider, MD  valsartan (DIOVAN) 80 MG tablet Take 80 mg by mouth daily.    Historical Provider, MD   BP 102/65 mmHg  Pulse 105  Temp(Src) 101.8 F (38.8 C) (Rectal)  Resp 29  Ht 5' 7.5" (1.715 m)  SpO2 90% Physical Exam  Constitutional: He is oriented to person, place, and time.  Patient is an elderly male in no acute distress. He does appear chronically ill.  HENT:  Head: Normocephalic and atraumatic.  Mouth/Throat: Oropharynx is clear and moist.  Neck: Normal range of motion. Neck supple.  Cardiovascular: Normal rate, regular rhythm and normal heart sounds.   No murmur heard. Pulmonary/Chest: Effort normal. No respiratory distress.  There are rales in the bases bilaterally.  Abdominal: Soft. Bowel sounds are normal. He exhibits no distension. There is no tenderness. There is no rebound.  Musculoskeletal: Normal range of motion. He exhibits no edema.  Lymphadenopathy:    He has no cervical adenopathy.  Neurological: He is alert and oriented to person, place, and time.  Skin: Skin is  warm and dry.  Nursing note and vitals reviewed.   ED Course  Procedures (including critical care time) Labs Review Labs Reviewed  CULTURE, BLOOD (ROUTINE X 2)  CULTURE, BLOOD (ROUTINE X 2)  URINE CULTURE  COMPREHENSIVE METABOLIC PANEL  CBC WITH DIFFERENTIAL/PLATELET  URINALYSIS, ROUTINE W REFLEX MICROSCOPIC (NOT AT [facility])  BRAIN NATRIURETIC PEPTIDE  I-STAT CG4 LACTIC ACID, ED    Imaging Review No results found. I have personally reviewed and evaluated these images and lab results as part of my medical decision-making.   EKG Interpretation   Date/Time:  Sunday June 10 2015 12:15:26 EDT Ventricular Rate:  103 PR Interval:  147 QRS Duration: 133 QT Interval:  346 QTC Calculation: 453 R Axis:   69 Text Interpretation:  Sinus tachycardia Right bundle branch block Anterior  infarct, age indeterminate Confirmed by Tori Cupps  MD, Zakira Ressel ([id number]) on  06/10/2015 12:32:12 PM      MDM   Final diagnoses:  None    Patient presents here with complaints of weakness, fever, and difficulty breathing that has worsened over the past several days. Here I see her with a temp of 101.8, tachycardic, with soft blood pressures. He was given a 500 mL bolus of saline and septic workup was initiated. Blood cultures were obtained, chest x-ray, and a urinalysis. He has no elevation of white count, normal lactate.  I've spoken with the teaching service who will evaluate the patient and likely admit.  CRITICAL CARE Performed by: Veryl Speak Total critical care time: 30 minutes Critical care time was exclusive of separately billable procedures and treating other patients. Critical care was necessary to treat or prevent imminent or life-threatening deterioration. Critical care was time spent personally by me on the following activities: development of treatment plan with patient and/or surrogate as well as nursing, discussions with consultants, evaluation of patient's response to treatment,  examination of patient, obtaining history from patient or surrogate, ordering and performing treatments and interventions, ordering and review of laboratory studies, ordering and review of radiographic studies, pulse oximetry and re-evaluation of patient's condition.      Veryl Speak, MD 06/10/15 [phone]

## 2015-06-10 NOTE — ED Notes (Signed)
Report attempted x 1

## 2015-06-10 NOTE — ED Notes (Signed)
CODE SEPSIS ACTIVATED 

## 2015-06-10 NOTE — ED Notes (Signed)
Dr. Stark Jock asked about starting antibiotics on code sepsis pt. No new orders at this time.

## 2015-06-10 NOTE — H&P (Signed)
Date: 06/10/2015               Patient Name:  MY SIDEL MRN: [id number]  DOB: June 27, 1935 Age / Sex: 80 y.o., male   PCP: [name], MD         Medical Service: Internal Medicine Teaching Service         Attending Physician: Dr. [name], MD    First Contact: Dr. Loleta Chance Pager: [phone]  Second Contact: Dr. Osa Craver Pager: [phone]       After Hours (After 5p/  First Contact Pager: [phone]  weekends / holidays): Second Contact Pager: [phone]   Chief Complaint: "He's had a fever and he's been weak for the last two weeks," per the wife  History of Present Illness: Mr. Bakken is a 80 year old man with a complex medical history including ischemic HFrEF with AICD (EF 25% in March 2013), CAD with 2 MIs in 2006 and 2012 s/p 5 stents, COPD (Gold II) on 3L oxygen at home and 6L when exerting himself, and an episode of sepsis from UTI with nephrolithasis in 2014, presenting with low-grade fever, confusion, and weakness for the last two weeks.  Eleven days ago, he was seen by pulmonologist Dr. Melvyn Novas for a follow-up appointment. At that time, he was treated for a COPD exacerbation with azithromycin and prednisone for 5 days. This didn't make him feel any better, and in fact his weakness and fever got worse.  Over the last two weeks, his wife says he's been having a low-grade fever, the highest being 100.1. He's also been more weak than usual; she says his legs have been trembling while he's walking and he'll crumple to the ground. He hasn't injured himself, and he hasn't felt light-headed or short of breath during these episodes; he simply feels weak. This morning, he was "talking out of his head," saying unusual things, so she called 911 and brought him into the emergency department.  He is incontinent of urine but he denies any dysuria, abdominal, or flank pain. He does endorse more of a runny nose recently, but denies any myalgias. His cough has not changed from  baseline, but he has been producing thicker sputum, albeit the same amount as usual. He takes 40mg  lasix as needed, but he's only needed one pill in the last week. Besides his low grade fever, weakness, and confusion, he denies any other complaints.  Notably, in 2014 he had sepsis from UTI with nephrolithiasis; at that time, his urinalysis was normal, but cultures grew coagulase-negative Staph, and blood cultures grew vancomycin-sensitive Enteroccocus. Per the wife, this is quite similar to how he felt during that episode in 2014.  In the emergency department, he was febrile to 101.8 rectally, hypotensive to 100/60, tachycardic to 105, saturating 91% on 5L oxygen. Basic labs were notable for a creatinine of 1.5 which is near his baseline, hemoglobin 11.8 which is also near his baseline, without a leukocytosis. His lactic acid was 0.78 and BNP was 76.8. Urinalysis showed ketones and amorphous urate, but was otherwise normal, and chest x-ray showed emphysematous changes but was otherwise unremarkable. He was started on vancomycin and Zosyn, given 500cc normal saline, and admitted.   Meds: Current Facility-Administered Medications  Medication Dose Route Frequency Provider Last Rate Last Dose  . piperacillin-tazobactam (ZOSYN) IVPB 3.375 g  3.375 g Intravenous Once Veryl Speak, MD 12.5 mL/hr at 06/10/15 1403 3.375 g at 06/10/15 1403   Current Outpatient Prescriptions  Medication Sig  Dispense Refill  . aspirin 81 MG tablet Take 81 mg by mouth daily.    . bisoprolol (ZEBETA) 5 MG tablet Take 0.5 tablets (2.5 mg total) by mouth daily. 30 tablet 3  . budesonide-formoterol (SYMBICORT) 160-4.5 MCG/ACT inhaler Inhale 2 puffs into the lungs 2 (two) times daily.    . cetirizine (ZYRTEC) 10 MG tablet Take 10 mg by mouth daily.    Marland Kitchen diltiazem (CARDIZEM) 120 MG tablet Take 120 mg by mouth daily.    Marland Kitchen EPINEPHrine 0.3 mg/0.3 mL IJ SOAJ injection Inject into the muscle once.    . ferrous sulfate 325 (65 FE) MG  tablet Take 325 mg by mouth daily with breakfast.    . furosemide (LASIX) 40 MG tablet Take 40 mg by mouth as needed for fluid.    Marland Kitchen gabapentin (NEURONTIN) 600 MG tablet Take 600 mg by mouth 3 (three) times daily.    Marland Kitchen ipratropium-albuterol (DUONEB) 0.5-2.5 (3) MG/3ML SOLN Take 3 mLs by nebulization every 4 (four) hours as needed.    . levalbuterol (XOPENEX HFA) 45 MCG/ACT inhaler Inhale into the lungs every 4 (four) hours as needed for wheezing.    . levalbuterol (XOPENEX) 0.63 MG/3ML nebulizer solution Take 1 ampule by nebulization 4 (four) times daily. For shortness of breath    . montelukast (SINGULAIR) 10 MG tablet Take 10 mg by mouth daily.      . nitroGLYCERIN (NITROSTAT) 0.4 MG SL tablet Place 0.4 mg under the tongue every 5 (five) minutes as needed. For chest pain    . omalizumab (XOLAIR) 150 MG injection Inject 150 mg into the skin every 28 (twenty-eight) days.    Marland Kitchen omeprazole (PRILOSEC) 20 MG capsule Take 20 mg by mouth daily.    . potassium chloride (MICRO-K) 10 MEQ CR capsule Take 10 mEq by mouth.    . prasugrel (EFFIENT) 10 MG TABS tablet Take 10 mg by mouth daily.    . ranitidine (ZANTAC) 150 MG capsule Take 150 mg by mouth 2 (two) times daily.    . rosuvastatin (CRESTOR) 40 MG tablet Take 40 mg by mouth daily.    Marland Kitchen terazosin (HYTRIN) 2 MG capsule Take 2 mg by mouth at bedtime.     . triamcinolone cream (KENALOG) 0.5 % Apply 1 application topically 2 (two) times daily.    . valsartan (DIOVAN) 80 MG tablet Take 80 mg by mouth daily.    Marland Kitchen azithromycin (ZITHROMAX) 250 MG tablet Take 2 on day one then 1 daily x 4 days (Patient not taking: Reported on 06/10/2015) 6 tablet 0  . predniSONE (DELTASONE) 10 MG tablet Take  4 each am x 2 days,   2 each am x 2 days,  1 each am x 2 days and stop (Patient not taking: Reported on 06/10/2015) 14 tablet 0    Allergies: Allergies as of 06/10/2015 - Review Complete 06/10/2015  Allergen Reaction Noted  . Demerol Other (See Comments) 07/17/2011  .  Zolpidem tartrate Other (See Comments) 07/17/2011   Past Medical History  Diagnosis Date  . Myocardial infarction ([place])   . Asthma   . COPD (chronic obstructive pulmonary disease) ([place])   . Emphysema   . CAD 04/23/2009  . Anticoagulated on warfarin 07/17/2011  . CHF (congestive heart failure) ([place])   . Ventricular thrombus following MI (myocardial infarction) ([place]) 11/12    on coumadin  . Angina   . Shortness of breath   . Chronic kidney disease   . Cancer ([place])   . Hypertension   .  Blood transfusion 3 weeks ago  . GERD (gastroesophageal reflux disease)   . Arthritis   . Pneumonia   . Anemia   . Atherosclerosis of native arteries of the extremities with intermittent claudication 08/13/2011  . Heart attack [facility]) October 27,2012 and July 28, 2011  . Heart attack [facility]) October  27,2012 and December 3,2012   . ICD (implantable cardiac defibrillator) in place   . S/P ICD (internal cardiac defibrillator) procedure, 6.17.13 02/10/2012   Past Surgical History  Procedure Laterality Date  . Coronary stent placement  06/21/11  . Cardiac catheterization    . Groin debridement  07/18/2011    Procedure: GROIN DEBRIDEMENT;  Surgeon: Angelia Mould, MD;  Location: [facility];  Service: Vascular;  Laterality: Right;  exploration of right groin,evacuation of right groin hematoma & repair of right superficial femoral artery  . Coronary angioplasty    . Insert / replace / remove pacemaker  02/09/2012    ICD  . Coronary angiogram N/A 07/28/2011    Procedure: CORONARY ANGIOGRAM;  Surgeon: Leonie Man, MD;  Location: [facility] CATH LAB;  Service: Cardiovascular;  Laterality: N/A;  . Percutaneous coronary stent intervention (pci-s) N/A 07/28/2011    Procedure: PERCUTANEOUS CORONARY STENT INTERVENTION (PCI-S);  Surgeon: Leonie Man, MD;  Location: [facility] CATH LAB;  Service: Cardiovascular;  Laterality: N/A;  . Implantable cardioverter defibrillator implant N/A 02/09/2012    Procedure: IMPLANTABLE  CARDIOVERTER DEFIBRILLATOR IMPLANT;  Surgeon: Sanda Klein, MD;  Location: [place] CATH LAB;  Service: Cardiovascular;  Laterality: N/A;  . Lead revision N/A 02/10/2012    Procedure: LEAD REVISION;  Surgeon: Sanda Klein, MD;  Location: [place] CATH LAB;  Service: Cardiovascular;  Laterality: N/A;   Family History  Problem Relation Age of Onset  . Diabetes type II Mother   . Coronary artery disease Mother   . Coronary artery disease Father   . Diabetes type II Brother   . Coronary artery disease Brother   . Craniosynostosis Neg Hx   . Heart attack Mother   . Hypertension Sister   . Stroke Neg Hx    Social History   Social History  . Marital Status: Married    Spouse Name: N/A  . Number of Children: N/A  . Years of Education: N/A   Occupational History  . retired    Social History Main Topics  . Smoking status: Former Smoker -- 3.00 packs/day for 40 years    Types: Cigarettes    Quit date: 04/13/2005  . Smokeless tobacco: Never Used  . Alcohol Use: No  . Drug Use: No  . Sexual Activity: No   Other Topics Concern  . Not on file   Social History Narrative   Review of Systems  Constitutional: Positive for fever and malaise/fatigue. Negative for chills, weight loss and diaphoresis.  HENT: Positive for congestion and sore throat.   Eyes: Negative for blurred vision.  Respiratory: Positive for cough, sputum production and shortness of breath. Negative for hemoptysis and wheezing.   Cardiovascular: Negative for chest pain, palpitations, orthopnea and leg swelling.  Gastrointestinal: Negative for heartburn, nausea, vomiting, abdominal pain, diarrhea, constipation and melena.  Genitourinary: Negative for dysuria.  Musculoskeletal: Positive for falls. Negative for myalgias, back pain and neck pain.  Skin: Negative for rash.  Neurological: Positive for tremors and weakness. Negative for dizziness, tingling, sensory change, focal weakness, loss of consciousness and headaches.    Psychiatric/Behavioral: Positive for memory loss.   Physical Exam: Blood pressure 109/72, pulse 97, temperature 101.8 F (  38.8 C), temperature source Rectal, resp. rate 25, height 5' 7.5" (1.715 m), SpO2 91 %.  General: Obese white man resting in bed, in no acute distress Eyes: No scleral icterus. Extra-ocular muscles intact. PERRLA Cardiac: Very distant heart sounds so difficult to appreciate any murmurs. No JVD.  Pulmonary: Breathing well, mild bibasilar crackles Abdomen: Distended but soft, with normal bowel sounds, non-tender to palpation Extremities: Warm and well perfused, without pedal edema Lymph: No cervical lymphadenopathy Skin: No rash Neuro: Alert and oriented to self and place; the year was "1916", cranial nerves II-XII grossly intact, non-focal neurologic exam  Lab results: Basic Metabolic Panel:  Recent Labs  06/10/15 1223  NA 135  K 4.9  CL 98*  CO2 28  GLUCOSE 127*  BUN 21*  CREATININE 1.52*  CALCIUM 7.9*   Liver Function Tests:  Recent Labs  06/10/15 1223  AST 32  ALT 21  ALKPHOS 47  BILITOT 1.2  PROT 5.8*  ALBUMIN 2.9*   CBC:  Recent Labs  06/10/15 1223  WBC 8.3  NEUTROABS 6.3  HGB 11.8*  HCT 36.3*  MCV 98.9  PLT 168   Urinalysis:  Recent Labs  06/10/15 1430  COLORURINE AMBER*  LABSPEC 1.021  PHURINE 6.0  GLUCOSEU NEGATIVE  HGBUR NEGATIVE  BILIRUBINUR NEGATIVE  KETONESUR 15*  PROTEINUR 30*  UROBILINOGEN 0.2  NITRITE NEGATIVE  LEUKOCYTESUR NEGATIVE   Imaging results:  Dg Chest Portable 1 View  06/10/2015  CLINICAL DATA:  Sepsis. EXAM: PORTABLE CHEST 1 VIEW COMPARISON:  PA and lateral chest 05/30/2015 and 11/23/2014. FINDINGS: Pacing device remains in place. Heart size is normal. The lungs appear emphysematous but are clear. No pneumothorax or pleural effusion. IMPRESSION: COPD.  No acute disease. Electronically Signed   By: Inge Rise M.D.   On: 06/10/2015 13:28   Other results: EKG: normal sinus rhythm, Q waves in   V1-V4, unchanged from previous.  Assessment & Plan by Problem: Mr. Winterbottom is a 80 year old man with history of severe COPD, heart failure, and an episode of sepsis due to UTI from nephrolithiasis, presenting with vague symptoms of subacute low-grade fever and weakness, found to be febrile to 101.2, tachycardic, and hypotensive. His vital signs point toward sepsis but there is no clear source and frankly he looks quite well on exam so I'm not even sure this is sepsis, but it may be early yet. Although his urinalysis showed only amorphous urate without hemoglobinuria, we will obtain a renal CT given his history of complicated UTI in the past with a similar presentation. This could potentially be early pneumonia so we will hydrate him and check another chest x-ray in the morning, but I think this is less likely given he denies any worsening of his cough from baseline. Or, this may be a viral URI given his rhinitis, weakness, low-grade fever, and he did not improve on azithromycin, which will have to be a diagnosis of exclusion. I don't think he has the flu given the subacute onset but this is a consideration going forward.  Regarding his heart failure, I'm concerned this could be dry, cold heart failure, as he has is a tad cold on exam, with a narrow pulse pressure with hypotension, so we will check a transthoracic echocardiogram. He also has oddly distant heart sounds with hypotension, but he does not have over JVD on exam, so a TTE will help rule out cardiac tamponade. However, his BNP was normal, making this quite unlikely.   2/4 SIRS: Per above,  I'm mostly concerned about an occult UTI, pneumonia, or viral URI. His hypotension and tachycardia, however, may be due to heart failure per below. -Continue vancomycin and Zosyn -He's gotten 1L fluids thus far, not the typical 30cc/kg for sepsis as sepsis remains low on our differential and he has severe heart failure -Will bolus to keep MAP > 65 -Renal CT  study -Chest x-ray in the morning -Procalcitonin -CBC tomorrow -Follow blood cultures -Acetaminophen for fever > 101  Heart failure with reduced ejection fraction: Per above, this may be cold, dry heart failure. -Transthoracic echocardiogram -Continue bisoprolol 2.5 daily -Continue aspirin and prasugrel -Continue rosuvastatin 40mg  -Holding diltiazem, furosemide, ranitidine given his hypotension -Consider cardiology consult tomorrow for further evaluation  COPD: This appears to be relatively stable, with 1/3 Gold's criteria (increased purulence) -Continue home nebs of symbicort twice daily, duonebs every 6 hours, albuterol as needed -No need for prednisone at this time -Continue guafenesin-dextromethorphan as needed -Continue loratadine 10mg  daily -Continue montelukast 10mg  daily  Dispo: Disposition is deferred at this time, awaiting improvement of current medical problems.  The patient does have a current PCP ([name], MD) and does need an [facility] hospital follow-up appointment after discharge.  The patient does have transportation limitations that hinder transportation to clinic appointments.  Signed: Loleta Chance, MD 06/10/2015, 3:32 PM

## 2015-06-11 ENCOUNTER — Other Ambulatory Visit: Payer: Self-pay | Admitting: *Deleted

## 2015-06-11 ENCOUNTER — Inpatient Hospital Stay (HOSPITAL_COMMUNITY): Payer: Medicare Other

## 2015-06-11 DIAGNOSIS — A419 Sepsis, unspecified organism: Secondary | ICD-10-CM

## 2015-06-11 DIAGNOSIS — J208 Acute bronchitis due to other specified organisms: Secondary | ICD-10-CM

## 2015-06-11 LAB — CORTISOL-AM, BLOOD: Cortisol - AM: 6.5 ug/dL — ABNORMAL LOW (ref 6.7–22.6)

## 2015-06-11 LAB — CBC
HCT: 35 % — ABNORMAL LOW (ref 39.0–52.0)
Hemoglobin: 11.4 g/dL — ABNORMAL LOW (ref 13.0–17.0)
MCH: 32 pg (ref 26.0–34.0)
MCHC: 32.6 g/dL (ref 30.0–36.0)
MCV: 98.3 fL (ref 78.0–100.0)
Platelets: 157 10*3/uL (ref 150–400)
RBC: 3.56 MIL/uL — ABNORMAL LOW (ref 4.22–5.81)
RDW: 12.9 % (ref 11.5–15.5)
WBC: 8.2 10*3/uL (ref 4.0–10.5)

## 2015-06-11 LAB — BASIC METABOLIC PANEL
ANION GAP: 12 (ref 5–15)
BUN: 17 mg/dL (ref 6–20)
CALCIUM: 8.3 mg/dL — AB (ref 8.9–10.3)
CO2: 27 mmol/L (ref 22–32)
Chloride: 100 mmol/L — ABNORMAL LOW (ref 101–111)
Creatinine, Ser: 1.5 mg/dL — ABNORMAL HIGH (ref 0.61–1.24)
GFR calc Af Amer: 50 mL/min — ABNORMAL LOW (ref >60)
GFR calc non Af Amer: 43 mL/min — ABNORMAL LOW (ref >60)
GLUCOSE: 106 mg/dL — AB (ref 65–99)
Potassium: 4 mmol/L (ref 3.5–5.1)
Sodium: 139 mmol/L (ref 135–145)

## 2015-06-11 LAB — URINE CULTURE: CULTURE: NO GROWTH

## 2015-06-11 LAB — MRSA PCR SCREENING: MRSA by PCR: NEGATIVE

## 2015-06-11 MED ORDER — PERFLUTREN LIPID MICROSPHERE
1.0000 mL | INTRAVENOUS | Status: AC | PRN
  Filled 2015-06-11: qty 10

## 2015-06-11 MED ORDER — DEXTROSE 5 % IV SOLN
1.0000 g | INTRAVENOUS | Status: DC
  Administered 2015-06-11 – 2015-06-12 (×2): 1 g via INTRAVENOUS
  Filled 2015-06-11 (×2): qty 10

## 2015-06-11 MED ORDER — IPRATROPIUM-ALBUTEROL 0.5-2.5 (3) MG/3ML IN SOLN
3.0000 mL | RESPIRATORY_TRACT | Status: DC
  Administered 2015-06-11 – 2015-06-12 (×4): 3 mL via RESPIRATORY_TRACT
  Filled 2015-06-11 (×8): qty 3

## 2015-06-11 MED ORDER — POLYETHYLENE GLYCOL 3350 17 G PO PACK
17.0000 g | PACK | Freq: Every day | ORAL | Status: DC
Start: 2015-06-11 — End: 2015-06-12
  Administered 2015-06-11 – 2015-06-12 (×2): 17 g via ORAL
  Filled 2015-06-11 (×2): qty 1

## 2015-06-11 MED ORDER — DOXYCYCLINE HYCLATE 100 MG PO TABS
100.0000 mg | ORAL_TABLET | Freq: Two times a day (BID) | ORAL | Status: DC
  Administered 2015-06-11 – 2015-06-12 (×3): 100 mg via ORAL
  Filled 2015-06-11 (×3): qty 1

## 2015-06-11 MED ORDER — IPRATROPIUM-ALBUTEROL 0.5-2.5 (3) MG/3ML IN SOLN
3.0000 mL | Freq: Four times a day (QID) | RESPIRATORY_TRACT | Status: DC
  Administered 2015-06-11 (×2): 3 mL via RESPIRATORY_TRACT
  Filled 2015-06-11 (×2): qty 3

## 2015-06-11 NOTE — Progress Notes (Signed)
  Date: 06/11/2015  Patient name: [name]  Medical record number: [id number]  Date of birth: 1934/11/02   I have seen and evaluated [name] and discussed their care with the Residency Team.  Briefly, [name] is a 80yo man with multiple medical problems including ICM with EF of 25%, AICD in place, CAD, COPD GOLD stage 2 on home oxygen who presented with low grade fever, confusion and weakness for 2 weeks.  He reports that about 11 days ago, he went to see his pulmonologist for similar symptoms and was treated for a COPD exacerbation with a Z-pack and steroids, but he did not  Improve.  He felt his weakness and fevers got worse.  He reports fevers up to 100.1 at home, increasing weakness with falls to the ground.  The morning of admission, he was confused and did not know where he was so he was brought to the ED.  Symptoms include chronic incontinence of urine, but without dysuria, rhinorrhea, chronic cough with sputum, but with thickening of sputum.  A couple of years ago, he had an episode of sepsis from a UTI with nephrolithiasis.  When I saw him with the team, he reported his breathing was at baseline, and he was no longer confused.    Exam:  Gen: Alert, Oriented to person and place Eyes: Anicteric, EOMI HENT:  in place, plethoric CVS: Distant heart sounds, no murmur noted Pulm:  mild crackles at bases, no wheezing Abd: Distended but soft, + BS, non tender Ext: No edema.  Feet cool to touch, hands appear blue but are warm.  Pulses intact Neuro: Alert, non focal exam.   Pertinent labs Ct 1.52  UA does not show an acute infection  CXR shows COPD, no acute disease  Assessment and Plan: I have seen and evaluated the patient as outlined above. I agree with the formulated Assessment and Plan as detailed in the residents' admission note, with the following changes:   1. Sirs, likely infectious but unknown source; differential also includes acute on chronic heart failure -  Fluids sparingly given severe heart failure - Viral source of infection (bronchitis possible) more likely than bacterial given course - He was initiated on broad spectrum Abx, BC pending.  Abx can be narrowed today - Repeat CXR pending - Trend WBC - Tylenol for fever - Check AM cortisol  2. Systolic CHF, acute on chronic - Continue home bisoprolol, aspirin, prasugrel, statin - Hold diltiazem, furosemide - TTE pending  3. COPD, possibly acute exacerbation - Continue home O2 - Hold steroids, recently completed a course - Nebs q 4 hours - Monitor for improvement.   4. CKD - Cr at baseline, trend  Mason Falcon, MD 10/17/20163:33 PM

## 2015-06-11 NOTE — Progress Notes (Signed)
Blood cultures positive for gram positive rods.  Notified MD on-call.  Will continue to monitor.

## 2015-06-11 NOTE — Discharge Summary (Signed)
Name: [name] MRN: [id number] DOB: 16-Aug-1935 80 y.o. PCP: [name], MD  Date of Admission: 06/10/2015 11:58 AM Date of Discharge: 06/13/2015 Attending Physician: [name], MD  Discharge Diagnosis: 1. Acute bronchitis in the setting of Gold's II COPD 2. Hearth failure with reduced ejection fraction 3. COPD  Discharge Medications:   Medication List    STOP taking these medications        azithromycin 250 MG tablet  Commonly known as:  ZITHROMAX     predniSONE 10 MG tablet  Commonly known as:  DELTASONE      TAKE these medications        aspirin 81 MG tablet  Take 81 mg by mouth daily.     bisoprolol 5 MG tablet  Commonly known as:  ZEBETA  Take 0.5 tablets (2.5 mg total) by mouth daily.     budesonide-formoterol 160-4.5 MCG/ACT inhaler  Commonly known as:  SYMBICORT  Inhale 2 puffs into the lungs 2 (two) times daily.     cefUROXime 250 MG tablet  Commonly known as:  CEFTIN  Take 1 tablet (250 mg total) by mouth 2 (two) times daily with a meal.     cetirizine 10 MG tablet  Commonly known as:  ZYRTEC  Take 10 mg by mouth daily.     diltiazem 120 MG tablet  Commonly known as:  CARDIZEM  Take 120 mg by mouth daily.     doxycycline 100 MG tablet  Commonly known as:  VIBRA-TABS  Take 1 tablet (100 mg total) by mouth every 12 (twelve) hours.     EPINEPHrine 0.3 mg/0.3 mL Soaj injection  Commonly known as:  EPI-PEN  Inject into the muscle once.     ferrous sulfate 325 (65 FE) MG tablet  Take 325 mg by mouth daily with breakfast.     furosemide 40 MG tablet  Commonly known as:  LASIX  Take 40 mg by mouth as needed for fluid.     gabapentin 600 MG tablet  Commonly known as:  NEURONTIN  Take 600 mg by mouth 3 (three) times daily.     ipratropium-albuterol 0.5-2.5 (3) MG/3ML Soln  Commonly known as:  DUONEB  Take 3 mLs by nebulization every 4 (four) hours as needed.     levalbuterol 0.63 MG/3ML nebulizer solution  Commonly known  as:  XOPENEX  Take 1 ampule by nebulization 4 (four) times daily. For shortness of breath     levalbuterol 45 MCG/ACT inhaler  Commonly known as:  XOPENEX HFA  Inhale into the lungs every 4 (four) hours as needed for wheezing.     montelukast 10 MG tablet  Commonly known as:  SINGULAIR  Take 10 mg by mouth daily.     nitroGLYCERIN 0.4 MG SL tablet  Commonly known as:  NITROSTAT  Place 0.4 mg under the tongue every 5 (five) minutes as needed. For chest pain     omalizumab 150 MG injection  Commonly known as:  XOLAIR  Inject 150 mg into the skin every 28 (twenty-eight) days.     omeprazole 20 MG capsule  Commonly known as:  PRILOSEC  Take 20 mg by mouth daily.     potassium chloride 10 MEQ CR capsule  Commonly known as:  MICRO-K  Take 10 mEq by mouth.     prasugrel 10 MG Tabs tablet  Commonly known as:  EFFIENT  Take 10 mg by mouth daily.     ranitidine 150 MG capsule  Commonly known as:  ZANTAC  Take 150 mg by mouth 2 (two) times daily.     rosuvastatin 40 MG tablet  Commonly known as:  CRESTOR  Take 40 mg by mouth daily.     terazosin 2 MG capsule  Commonly known as:  HYTRIN  Take 2 mg by mouth at bedtime.     triamcinolone cream 0.5 %  Commonly known as:  KENALOG  Apply 1 application topically 2 (two) times daily.     valsartan 80 MG tablet  Commonly known as:  DIOVAN  Take 80 mg by mouth daily.        Disposition and follow-up:   Mr.[name] was discharged from [facility] in Good condition.  At the hospital follow up visit please address:  1.  His urinary incontinence caused by urethral stricture. He was sent home with 1 month supply on condom catheters and was asked to follow-up with his urologist.  2. Resolution of his acute bronchitis.  Procedures Performed:  X-ray Chest Pa And Lateral  06/11/2015  CLINICAL DATA:  Pneumonia.  Shortness of breath. EXAM: CHEST  2 VIEW COMPARISON:  06/10/2015 chest radiograph and abdominal and  pelvic CT FINDINGS: ICD remains in place. Cardiac silhouette is unchanged and within normal limits in size. The lungs are hyperinflated with bronchitic changes. There is patchy posterior basilar airspace opacity on the lateral radiograph, possibly in the right lower lobe. No sizable pleural effusion is identified, although the posterior costophrenic angles were excluded. 8 mm right midlung nodule is unchanged from 05/30/2015. No pneumothorax or acute osseous abnormality is seen. IMPRESSION: Bronchitic changes with patchy basilar airspace opacity which may reflect superimposed pneumonia. Electronically Signed   By: [name] M.D.   On: 06/11/2015 07:53   Dg Chest 2 View  05/30/2015  CLINICAL DATA:  COPD exacerbation EXAM: CHEST  2 VIEW COMPARISON:  November 23, 2014 and February 10, 2012 FINDINGS: Lungs remain somewhat hyperexpanded with scarring in the bases. There is no frank edema or consolidation. There is a stable 9 x 5 mm nodular opacity in the right lower lobe region The heart size and pulmonary vascular normal. No adenopathy. Pacemaker leads are attached to the right atrium right ventricle. Bones are somewhat osteoporotic. IMPRESSION: Persistent lung hyperexpansion with bibasilar scarring. No edema or consolidation. Stable nodular opacity right lower lobe. Stability since 2013 is consistent with benign etiology. No appreciable change compared to prior study. Electronically Signed   By: [name] III M.D.   On: 05/30/2015 16:03   Ct Renal Stone Study  06/10/2015  CLINICAL DATA:  Low-grade fever, confusion, and weakness for 2 weeks, history kidney stones EXAM: CT ABDOMEN AND PELVIS WITHOUT CONTRAST TECHNIQUE: Multidetector CT imaging of the abdomen and pelvis was performed following the standard protocol without IV contrast. COMPARISON:  07/17/2011 FINDINGS: Lower chest: Bilateral dependent atelectasis. Trace bilateral pleural effusions. These findings are new. There are also small calcified right  hilar lymph nodes and a calcified 5 mm granuloma laterally in the right lower lobe. Mild bronchitic change bilateral lower lobes right greater than left, representing a change from the prior study. There is also an element of vascular congestion. Hepatobiliary: Negative Pancreas: Negative Spleen: Numerous punctate calcifications suggesting prior granulomatous exposure, stable Adrenals/Urinary Tract: Adrenal glands are normal. 1 mm lower pole stone on the left. No hydronephrosis. Chronic stable perinephric inflammation bilaterally, not clearly of acute significance. Bladder is distended but normal otherwise. Stomach/Bowel: Significant diverticulosis of the sigmoid colon. Otherwise  negative. Vascular/Lymphatic: Atherosclerotic aortoiliac calcification Reproductive: Numerous radiation therapy seeds in the region of the prostate. Other: No ascites. Musculoskeletal: No acute or focally suspicious findings IMPRESSION: Very small bilateral pleural effusions which are new from the prior study. There is also evidence of bronchitic change that is new from the prior study. Findings may indicate bronchitis. There also appears to be an element of vascular congestion but no definite pulmonary edema. No acute abnormality in the abdomen or pelvis. Electronically Signed   By: Skipper Cliche M.D.   On: 06/10/2015 18:49    2D Echo:  Study Conclusions  - Left ventricle: The cavity size was mildly dilated. Systolic function was moderately reduced. The estimated ejection fraction was in the range of 35% to 40%. Diffuse hypokinesis. The study is not technically sufficient to allow evaluation of LV diastolic function. - Aortic valve: Trileaflet; normal thickness leaflets. There was no regurgitation. - Aortic root: The aortic root was normal in size. - Mitral valve: Structurally normal valve. There was no regurgitation. - Right ventricle: The cavity size was normal. Wall thickness was normal. Systolic  function was normal. - Right atrium: Pacer wire or catheter noted in right atrium. - Tricuspid valve: There was mild regurgitation. - Pulmonary arteries: Systolic pressure was within the normal range.  Impressions:  - This is a very limited study quality, LVEF appears to be moderately decreased estimated at 35-40% with diffuse hypokinesis. RVEF is normal.  Admission HPI:  Mr. Orbin is a 80 year old man with a complex medical history including ischemic HFrEF with AICD (EF 25% in March 2013), CAD with 2 MIs in 2006 and 2012 s/p 5 stents, COPD (Gold II) on 3L oxygen at home and 6L when exerting himself, and an episode of sepsis from UTI with nephrolithasis in 2014, presenting with low-grade fever, confusion, and weakness for the last two weeks.  Eleven days ago, he was seen by pulmonologist Dr. Melvyn Novas for a follow-up appointment. At that time, he was treated for a COPD exacerbation with azithromycin and prednisone for 5 days. This didn't make him feel any better, and in fact his weakness and fever got worse.  Over the last two weeks, his wife says he's been having a low-grade fever, the highest being 100.1. He's also been more weak than usual; she says his legs have been trembling while he's walking and he'll crumple to the ground. He hasn't injured himself, and he hasn't felt light-headed or short of breath during these episodes; he simply feels weak. This morning, he was "talking out of his head," saying unusual things, so she called 911 and brought him into the emergency department.  He is incontinent of urine but he denies any dysuria, abdominal, or flank pain. He does endorse more of a runny nose recently, but denies any myalgias. His cough has not changed from baseline, but he has been producing thicker sputum, albeit the same amount as usual. He takes 40mg  lasix as needed, but he's only needed one pill in the last week. Besides his low grade fever, weakness, and confusion, he denies  any other complaints.  Notably, in 2014 he had sepsis from UTI with nephrolithiasis; at that time, his urinalysis was normal, but cultures grew coagulase-negative Staph, and blood cultures grew vancomycin-sensitive Enteroccocus. Per the wife, this is quite similar to how he felt during that episode in 2014.  In the emergency department, he was febrile to 101.8 rectally, hypotensive to 100/60, tachycardic to 105, saturating 91% on 5L oxygen. Basic labs  were notable for a creatinine of 1.5 which is near his baseline, hemoglobin 11.8 which is also near his baseline, without a leukocytosis. His lactic acid was 0.78 and BNP was 76.8. Urinalysis showed ketones and amorphous urate, but was otherwise normal, and chest x-ray showed emphysematous changes but was otherwise unremarkable. He was started on vancomycin and Zosyn, given 500cc normal saline, and admitted.   Hospital Course by problem list:   1. Acute bronchitis in the setting of Gold's II COPD: Mr. Engman generalized malaise and dyspnea improved significantly with rehydration and initiation of antibiotics. He was originally started on vancomycin and Zosyn given concern for sepsis as he was febrile to 101.2 on presentation, but he quickly defervesced and looked quite well, so he was started on ceftriaxone and doxycycline empirically for community-acquired pneumonia and he improved quickly on these antibiotics. He was discharged home with a 10 day course of cefuroxime and docycycline. Notably, 1/2 blood cultures showed gram positive rods, which I suspect was contaminant given his rapid improvement in clinical status and he did not appear to be septic.  2. Heart failure with reduced ejection fraction: There was originally concern that he may be presenting with dry, cold heart failure, as he was quite cold, hypotensive around his baseline with a narrow pulse pressure, and reportedly has an ejection fraction of 20%, per the patient. However, his mental status  and hypotension improved quickly with fluids so I suspect he was slightly dehydrated. A TTE showed an ejection fraction of 35-40% with normal valves. His home medications were continued upon discharged, per above.  3. COPD: He saturated 90-94% on his home oxygen dose of 3L at rest at 6L on exertion, and received his home duonebs. We did not start prednisone as he only had increased purulence of his sputum, without another Gold's criteria, and he had just gotten a course last week and he said this didn't help him. We restarted his home medications upon discharge.  Discharge Vitals:   BP 108/73 mmHg  Pulse 100  Temp(Src) 98.3 F (36.8 C) (Oral)  Resp 18  Ht 5' 7.5" (1.715 m)  Wt 98.748 kg (217 lb 11.2 oz)  BMI 33.57 kg/m2  SpO2 93%  Signed: Loleta Chance, MD 06/13/2015, 8:47 AM

## 2015-06-11 NOTE — Progress Notes (Signed)
Ceftriaxone and doxycycline both do not need renal adjustment.  [employer] policy allows pharmacy to change the ordered dose of ceftriaxone based on indication without contacting the physician, therefore a consult is not needed.   Plan: -ceftriaxone 1g IV q24h -doxycycline 100mg  PO q12h -pharmacy will not follow as no adjustment needed. Please re-consult if new antibiotics are started or if questions regarding treatment arise. Thanks!  [name] D. Ovid Witman, PharmD, BCPS Clinical Pharmacist Pager: [phone] 06/11/2015 10:21 AM

## 2015-06-11 NOTE — Progress Notes (Signed)
Utilization review completed. Cabela Pacifico, RN, BSN. 

## 2015-06-11 NOTE — Progress Notes (Addendum)
Patient ID: [name], male   DOB: 1935/03/21, 80 y.o.   MRN: [id number]   Subjective: [name] was quite agitated this morning that we had consulted occupational therapy and ordered a transthoracic echocardiogram. He believes we're trying to make money off of him by ordering unnecessary tests. I explained that we were doing our best to help him but it's understandable he doesn't want these done. He says his last heart echo was about a month ago at the [facility] hospital and he follows there quite closely.  He says his breathing is actually a bit better than yesterday. He still feels somewhat weak and has a decreased appetite, but otherwise he's feeling well.  Objective: Vital signs in last 24 hours: Filed Vitals:   06/10/15 2127 06/10/15 2227 06/11/15 0241 06/11/15 0523  BP: 90/62   101/80  Pulse: 85 88 88 93  Temp: 99.1 F (37.3 C)   98.9 F (37.2 C)  TempSrc: Oral   Oral  Resp: 20 22 20 18   Height:      SpO2: 93%   92%   General: Obese white man resting in bed, in no acute distress Eyes: No scleral icterus. Extra-ocular muscles intact. PERRLA Cardiac: Very distant heart sounds so difficult to appreciate any murmurs. No JVD.  Pulmonary: Breathing well, mild bibasilar crackles, unchanged from yesterday Abdomen: Distended but soft, with normal bowel sounds, non-tender to palpation Extremities: Warm and well perfused, without pedal edema Lymph: No cervical lymphadenopathy Skin: No rash. Skin dry. Neuro: More alert and cogent today compared to yesterday, cranial nerves II-XII grossly intact  Lab Results: Basic Metabolic Panel:  Recent Labs Lab 06/10/15 1223 06/11/15 0518  NA 135 139  K 4.9 4.0  CL 98* 100*  CO2 28 27  GLUCOSE 127* 106*  BUN 21* 17  CREATININE 1.52* 1.50*  CALCIUM 7.9* 8.3*   CBC:  Recent Labs Lab 06/10/15 1223 06/11/15 0518  WBC 8.3 8.2  NEUTROABS 6.3  --   HGB 11.8* 11.4*  HCT 36.3* 35.0*  MCV 98.9 98.3  PLT 168 157   Urinalysis:  Recent  Labs Lab 06/10/15 1430  COLORURINE AMBER*  LABSPEC 1.021  PHURINE 6.0  GLUCOSEU NEGATIVE  HGBUR NEGATIVE  BILIRUBINUR NEGATIVE  KETONESUR 15*  PROTEINUR 30*  UROBILINOGEN 0.2  NITRITE NEGATIVE  LEUKOCYTESUR NEGATIVE   Studies/Results: X-ray Chest Pa And Lateral  06/11/2015  CLINICAL DATA:  Pneumonia.  Shortness of breath. EXAM: CHEST  2 VIEW COMPARISON:  06/10/2015 chest radiograph and abdominal and pelvic CT FINDINGS: ICD remains in place. Cardiac silhouette is unchanged and within normal limits in size. The lungs are hyperinflated with bronchitic changes. There is patchy posterior basilar airspace opacity on the lateral radiograph, possibly in the right lower lobe. No sizable pleural effusion is identified, although the posterior costophrenic angles were excluded. 8 mm right midlung nodule is unchanged from 05/30/2015. No pneumothorax or acute osseous abnormality is seen. IMPRESSION: Bronchitic changes with patchy basilar airspace opacity which may reflect superimposed pneumonia. Electronically Signed   By: [name] M.D.   On: 06/11/2015 07:53   Ct Renal Stone Study  06/10/2015  CLINICAL DATA:  Low-grade fever, confusion, and weakness for 2 weeks, history kidney stones EXAM: CT ABDOMEN AND PELVIS WITHOUT CONTRAST TECHNIQUE: Multidetector CT imaging of the abdomen and pelvis was performed following the standard protocol without IV contrast. COMPARISON:  07/17/2011 FINDINGS: Lower chest: Bilateral dependent atelectasis. Trace bilateral pleural effusions. These findings are new. There are also small calcified right hilar  lymph nodes and a calcified 5 mm granuloma laterally in the right lower lobe. Mild bronchitic change bilateral lower lobes right greater than left, representing a change from the prior study. There is also an element of vascular congestion. Hepatobiliary: Negative Pancreas: Negative Spleen: Numerous punctate calcifications suggesting prior granulomatous exposure, stable  Adrenals/Urinary Tract: Adrenal glands are normal. 1 mm lower pole stone on the left. No hydronephrosis. Chronic stable perinephric inflammation bilaterally, not clearly of acute significance. Bladder is distended but normal otherwise. Stomach/Bowel: Significant diverticulosis of the sigmoid colon. Otherwise negative. Vascular/Lymphatic: Atherosclerotic aortoiliac calcification Reproductive: Numerous radiation therapy seeds in the region of the prostate. Other: No ascites. Musculoskeletal: No acute or focally suspicious findings IMPRESSION: Very small bilateral pleural effusions which are new from the prior study. There is also evidence of bronchitic change that is new from the prior study. Findings may indicate bronchitis. There also appears to be an element of vascular congestion but no definite pulmonary edema. No acute abnormality in the abdomen or pelvis. Electronically Signed   By: Skipper Cliche M.D.   On: 06/10/2015 18:49   Medications: I have reviewed the patient's current medications. Scheduled Meds: . antiseptic oral rinse  7 mL Mouth Rinse BID  . aspirin EC  81 mg Oral Daily  . bisoprolol  2.5 mg Oral Daily  . budesonide-formoterol  2 puff Inhalation BID  . enoxaparin (LOVENOX) injection  40 mg Subcutaneous Daily  . ferrous sulfate  325 mg Oral Q breakfast  . gabapentin  600 mg Oral TID  . Influenza vac split quadrivalent PF  0.5 mL Intramuscular Tomorrow-1000  . ipratropium-albuterol  3 mL Nebulization Q4H  . loratadine  10 mg Oral Daily  . montelukast  10 mg Oral Daily  . pantoprazole  40 mg Oral Daily  . piperacillin-tazobactam (ZOSYN)  IV  3.375 g Intravenous 3 times per day  . prasugrel  10 mg Oral Daily  . rosuvastatin  40 mg Oral Daily  . sodium chloride  3 mL Intravenous Q12H  . vancomycin  750 mg Intravenous QHS   Continuous Infusions:  PRN Meds:.acetaminophen **OR** acetaminophen, guaiFENesin-dextromethorphan, ipratropium-albuterol, nitroGLYCERIN, polyethylene glycol    Assessment/Plan:  Viral pneumonia: His renal CT showed no kidney stone but did show small bilateral pleural effusions and new bronchitis changes in his airway, so I'm leaning toward this being a viral pneumonia. However, I'll continue empiric treatment for CAP with ceftriaxone and doxycycline, as he did not improve on azithromycin last week and this may represent early bacterial pneumonia, albeit less likely. -Changed vancomycin and Zosyn to ceftriaxone and doxycycline -Bolus to keep MAP > 65 -CBC tomorrow -Follow blood cultures  Heart failure with reduced ejection fraction: He refused the transthoracic echocardiogram today. We'll continue holding some of his antihypertensives as his pressures remain soft. -Continue bisoprolol 2.5 daily -Continue aspirin and prasugrel -Continue rosuvastatin 40mg  -Holding diltiazem, furosemide, ranitidine given his hypotension -Consider cardiology consult tomorrow for further evaluation  COPD: Continues to be well-controlled. -Continue home nebs of symbicort twice daily, duonebs every 6 hours, albuterol as needed -Continue guafenesin-dextromethorphan as needed -Continue loratadine 10mg  daily -Continue montelukast 10mg  daily  Low morning cortisol: His morning cortisol was 6, which I suspect was from HPA axis suppression from his recent course last week. I don't think he has adrenal insufficiency because he didn't feel much better after getting steroids during his COPD exacerbation last week. This could be repeated when he's healthy to evaluated for adrenal insufficiency. There was no adrenal incidentaloma noted on his  renal CT. -Consider repeating AM cortisol on outpatient basis  Dispo: Disposition is deferred at this time, awaiting improvement of current medical problems.  The patient does have a current PCP ([name], MD) and does need an [facility] hospital follow-up appointment after discharge.  The patient does have transportation limitations  that hinder transportation to clinic appointments.  .Services Needed at time of discharge: Y = Yes, Blank = No PT:   OT:   RN:   Equipment:   Other:     LOS: 1 day   Loleta Chance, MD 06/11/2015, 8:09 AM  ADDENDUM 1239:  Dr. Marvel Plan spoke with [name] and his family and they agreed to undergo the transthoracic echo to investigate his ejection fraction and evaluate for pericardial effusion.  Loleta Chance, MD

## 2015-06-11 NOTE — Progress Notes (Signed)
PT Cancellation Note  Patient Details Name: [name] MRN: [id number] DOB: November 06, 1934   Cancelled Treatment:    Reason Eval/Treat Not Completed:  (refused). Pt refusing OOB but is also at 89% on 6LO2 via . Respiratory came to give treatment and patient refused that as well. PT to return as able.   Kingsley Callander 06/11/2015, 11:59 AM   Kittie Plater, PT, DPT Pager #: [phone] Office #: [phone]

## 2015-06-11 NOTE — Progress Notes (Signed)
OT Cancellation Note  Patient Details Name: [name] MRN: [id number] DOB: 10/29/1934   Cancelled Treatment:    Reason Eval/Treat Not Completed: Patient declined, no reason specified. Pt refused OT services, stating "I don't need therapy, I can get assistance from my wife and daughter who are in medical field". Therapist and MD advocated for therapy necessity, however pt still politely decline the continuation of OT eval and services. Lin Landsman 06/11/2015, 8:56 AM

## 2015-06-11 NOTE — Patient Outreach (Signed)
[facility]) Care Management  06/11/2015  [name] [id number]   I received a call from [name] today, spouse of [name]. [name] related that [name] was admitted today for respiratory illness. [name] and I spoke by phone and he said he was having "breathing troubles, maybe pneumonia".   [name] is a primary care patient of [name] at [facility] in [place]. He is followed by [name] at the [facility] and by [name] at [facility]. [name], even when he showers. He has very limited reserve and typically can only move about the house very slowly. He has an IT trainer wheelchair and uses this when he leaves the home. [name] is very well versed in his medications and takes all medications as prescribed. [name] lives at home with his wife and stepdaughter both of whom are very supportive. [name] adult son died unexpectedly a few months ago from an accidental overdose and this has been quite difficult for [name]. He has exceptional family and church family support.   I see [name] at home at least monthly for assessments and disease management. I will follow his progress closely and will speak with him no less than 48 hours after hospital discharge.    [place] [facility]  [phone]

## 2015-06-12 ENCOUNTER — Inpatient Hospital Stay (HOSPITAL_COMMUNITY): Payer: Medicare Other

## 2015-06-12 DIAGNOSIS — R32 Unspecified urinary incontinence: Secondary | ICD-10-CM

## 2015-06-12 DIAGNOSIS — J449 Chronic obstructive pulmonary disease, unspecified: Secondary | ICD-10-CM | POA: Insufficient documentation

## 2015-06-12 DIAGNOSIS — I959 Hypotension, unspecified: Secondary | ICD-10-CM | POA: Diagnosis not present

## 2015-06-12 DIAGNOSIS — N3642 Intrinsic sphincter deficiency (ISD): Secondary | ICD-10-CM | POA: Insufficient documentation

## 2015-06-12 DIAGNOSIS — Z9981 Dependence on supplemental oxygen: Secondary | ICD-10-CM | POA: Diagnosis not present

## 2015-06-12 DIAGNOSIS — I509 Heart failure, unspecified: Secondary | ICD-10-CM

## 2015-06-12 DIAGNOSIS — J438 Other emphysema: Secondary | ICD-10-CM

## 2015-06-12 DIAGNOSIS — J129 Viral pneumonia, unspecified: Secondary | ICD-10-CM | POA: Diagnosis not present

## 2015-06-12 LAB — CULTURE, BLOOD (ROUTINE X 2)

## 2015-06-12 LAB — BASIC METABOLIC PANEL
Anion gap: 12 (ref 5–15)
BUN: 16 mg/dL (ref 6–20)
CALCIUM: 8.3 mg/dL — AB (ref 8.9–10.3)
CO2: 26 mmol/L (ref 22–32)
Chloride: 100 mmol/L — ABNORMAL LOW (ref 101–111)
Creatinine, Ser: 1.45 mg/dL — ABNORMAL HIGH (ref 0.61–1.24)
GFR calc Af Amer: 52 mL/min — ABNORMAL LOW (ref >60)
GFR, EST NON AFRICAN AMERICAN: 45 mL/min — AB (ref >60)
GLUCOSE: 136 mg/dL — AB (ref 65–99)
Potassium: 3.8 mmol/L (ref 3.5–5.1)
Sodium: 138 mmol/L (ref 135–145)

## 2015-06-12 LAB — CBC
HCT: 34.8 % — ABNORMAL LOW (ref 39.0–52.0)
Hemoglobin: 11.6 g/dL — ABNORMAL LOW (ref 13.0–17.0)
MCH: 32.6 pg (ref 26.0–34.0)
MCHC: 33.3 g/dL (ref 30.0–36.0)
MCV: 97.8 fL (ref 78.0–100.0)
PLATELETS: 171 10*3/uL (ref 150–400)
RBC: 3.56 MIL/uL — ABNORMAL LOW (ref 4.22–5.81)
RDW: 12.8 % (ref 11.5–15.5)
WBC: 9.5 10*3/uL (ref 4.0–10.5)

## 2015-06-12 MED ORDER — PERFLUTREN LIPID MICROSPHERE
1.0000 mL | INTRAVENOUS | Status: AC | PRN
  Administered 2015-06-12: 2 mL via INTRAVENOUS
  Filled 2015-06-12: qty 10

## 2015-06-12 MED ORDER — BISACODYL 10 MG RE SUPP
10.0000 mg | Freq: Once | RECTAL | Status: DC
  Filled 2015-06-12: qty 1

## 2015-06-12 MED ORDER — CEFUROXIME AXETIL 250 MG PO TABS: 250.0000 mg | ORAL_TABLET | Freq: Two times a day (BID) | ORAL | Status: AC

## 2015-06-12 MED ORDER — DOXYCYCLINE HYCLATE 100 MG PO TABS: 100.0000 mg | ORAL_TABLET | Freq: Two times a day (BID) | ORAL | Status: AC

## 2015-06-12 NOTE — Progress Notes (Signed)
Attempted for the forth time to administer dulcolax suppository; pt states "no I better wait." If pt is not discharged will attempt administration later on.

## 2015-06-12 NOTE — Progress Notes (Signed)
Pt. Discharge summary and AVS reviewed. Pt. Daughters and spouse present at bedside. Verbalized understanding of follow-up appointments and medications to be picked up at [facility]. Educated to call in the event of an emergency. Dorthey Sawyer, RN

## 2015-06-12 NOTE — Progress Notes (Signed)
  Date: 06/12/2015  Patient name: [name]  Medical record number: [id number]  Date of birth: 1935/08/06   This patient's plan of care was discussed with the house staff. Please see their note for complete details. I concur with their findings.  I saw and examined the patient with the resident team.  [name] is improved today.  Transition to oral Abx.  TTE is pending, await results prior to discharge.  Hopefully can be discharged later today.    [name], MD 06/12/2015, 8:04 PM

## 2015-06-12 NOTE — Evaluation (Signed)
Physical Therapy Evaluation Patient Details Name: [name] MRN: [id number] DOB: 10-31-1934 Today's Date: 06/12/2015   History of Present Illness  [name] is a 80 year old man with a complex medical history including ischemic HFrEF with AICD (EF 25% in March 2013), CAD with 2 MIs in 2006 and 2012 s/p 5 stents, COPD (Gold II) on 3L oxygen at home and 6L when exerting himself, and an episode of sepsis from UTI with nephrolithasis in 2014, presenting with low-grade fever, confusion, and weakness for the last two weeks.  Clinical Impression  Pt admitted for above. Pt presenting with decreased activity tolerance, SpO2 >87% on 6Lo2 via [place]. Pt functioning at min guard level and con't to require assist for LB dressing which was baseline. Family feels they are capable to taking patient home at min guard level of function however strongly desires for pt to stay another night to improve function prior to d/c.    Follow Up Recommendations Home health PT;Supervision/Assistance - 24 hour    Equipment Recommendations  3in1 (PT) (rollator)    Recommendations for Other Services       Precautions / Restrictions Precautions Precautions: Fall Restrictions Weight Bearing Restrictions: No      Mobility  Bed Mobility Overal bed mobility: Modified Independent             General bed mobility comments: increased time, HOB elevated, definite use of UEs and bedrail however has a sleep number adjustable bed at home  Transfers Overall transfer level: Needs assistance Equipment used: Rolling walker (2 wheeled) Transfers: Sit to/from Stand Sit to Stand: Min guard         General transfer comment: v/c's to push up from bed not pull up from walker  Ambulation/Gait Ambulation/Gait assistance: Min guard Ambulation Distance (Feet): 120 Feet Assistive device: Rolling walker (2 wheeled) Gait Pattern/deviations: Step-through pattern Gait velocity: dec Gait velocity interpretation: Below  normal speed for age/gender General Gait Details: + SOB, SpO2 >87% on 6LO2 via [place]. pt requires RW for stability, no episodes of LOB but required standing rest break half way due to+SOB  Stairs            Wheelchair Mobility    Modified Rankin (Stroke Patients Only)       Balance Overall balance assessment: Needs assistance             Standing balance comment: able to stand statically but needs RW for safe amb                             Pertinent Vitals/Pain Pain Assessment: No/denies pain    Home Living Family/patient expects to be discharged to:: Private residence Living Arrangements: Spouse/significant other Available Help at Discharge: Family;Available 24 hours/day Type of Home: House Home Access: Stairs to enter Entrance Stairs-Rails: Can reach both Entrance Stairs-Number of Steps: 4 Home Layout: One level (handicapped accessible) Home Equipment: Walker - 2 wheels;Shower seat;Hand held shower head      Prior Function Level of Independence: Needs assistance   Gait / Transfers Assistance Needed: pt had to use cane and/or RW for the last 3 weeks  ADL's / Homemaking Assistance Needed: wife assist with some bathing and lower body dressing, wife fixed meals        Hand Dominance   Dominant Hand: Right    Extremity/Trunk Assessment   Upper Extremity Assessment: Overall WFL for tasks assessed  Lower Extremity Assessment: Overall WFL for tasks assessed      Cervical / Trunk Assessment: Normal  Communication   Communication: No difficulties  Cognition Arousal/Alertness: Awake/alert Behavior During Therapy: WFL for tasks assessed/performed Overall Cognitive Status: Within Functional Limits for tasks assessed                      General Comments General comments (skin integrity, edema, etc.): discussed at length with family if wife and daughter feel they can handle patient at min guard level and con't to assist with  some dressing and bathing. wife reports "I want to take him home but I just want to see him stay another day to get stronger.""    Exercises        Assessment/Plan    PT Assessment Patient needs continued PT services  PT Diagnosis Generalized weakness   PT Problem List Decreased strength;Decreased activity tolerance;Decreased balance;Decreased mobility;Cardiopulmonary status limiting activity  PT Treatment Interventions DME instruction;Gait training;Stair training;Functional mobility training;Therapeutic activities;Therapeutic exercise   PT Goals (Current goals can be found in the Care Plan section) Acute Rehab PT Goals Patient Stated Goal: home PT Goal Formulation: With patient Time For Goal Achievement: 06/19/15 Potential to Achieve Goals: Good    Frequency Min 3X/week   Barriers to discharge        Co-evaluation               End of Session Equipment Utilized During Treatment: Gait belt Activity Tolerance: Patient limited by fatigue Patient left: in chair;with call bell/phone within reach;with family/visitor present Nurse Communication: Mobility status         Time: [id number] PT Time Calculation (min) (ACUTE ONLY): 36 min   Charges:   PT Evaluation $Initial PT Evaluation Tier I: 1 Procedure PT Treatments $Gait Training: 8-22 mins   PT G CodesKingsley Callander 06/12/2015, 2:53 PM   Kittie Plater, PT, DPT Pager #: [phone] Office #: [phone]

## 2015-06-12 NOTE — Progress Notes (Signed)
Patient ID: [name], male   DOB: 12/27/1934, 80 y.o.   MRN: [id number]   Subjective: [name] said he's feeling great today, and is ready to go home after he gets his echo. He thinks this was some sort of virus and it has "run its course." His breathing is back to his baseline, and he denies any fevers, chills, or other complaints. He still hasn't had a bowel movement in about a week so he wanted to try an enema today before he goes home.  Objective: Vital signs in last 24 hours: Filed Vitals:   06/11/15 1900 06/11/15 2100 06/11/15 2331 06/12/15 0500  BP:  126/71  103/74  Pulse:  92  114  Temp:  99.4 F (37.4 C)  99.4 F (37.4 C)  TempSrc:  Oral  Oral  Resp:  20  18  Height:      Weight:  98.748 kg (217 lb 11.2 oz)    SpO2: 94% 93% 92% 92%   General: Obese white man resting in bed, in no acute distress Eyes: No scleral icterus. Extra-ocular muscles intact. PERRLA Cardiac: Very distant heart sounds so difficult to appreciate any murmurs, unchanged from admission. No JVD.  Pulmonary: Breathing well, mild bibasilar crackles, unchanged from yesterday Abdomen: Distended but soft, with normal bowel sounds, non-tender to palpation Extremities: Warm and well perfused, no pedal edema Lymph: No cervical lymphadenopathy Skin: No rash. Skin dry. Neuro: Much more alert and cogent today compared to admission, cranial nerves II-XII grossly intact  Lab Results: Basic Metabolic Panel:  Recent Labs Lab 06/11/15 0518 06/12/15 0416  NA 139 138  K 4.0 3.8  CL 100* 100*  CO2 27 26  GLUCOSE 106* 136*  BUN 17 16  CREATININE 1.50* 1.45*  CALCIUM 8.3* 8.3*   CBC:  Recent Labs Lab 06/10/15 1223 06/11/15 0518 06/12/15 0416  WBC 8.3 8.2 9.5  NEUTROABS 6.3  --   --   HGB 11.8* 11.4* 11.6*  HCT 36.3* 35.0* 34.8*  MCV 98.9 98.3 97.8  PLT 168 157 171   Micro:  1/2 blood cultures positive for gram positive rods  Medications: I have reviewed the patient's current  medications. Scheduled Meds: . antiseptic oral rinse  7 mL Mouth Rinse BID  . aspirin EC  81 mg Oral Daily  . bisacodyl  10 mg Rectal Once  . bisoprolol  2.5 mg Oral Daily  . budesonide-formoterol  2 puff Inhalation BID  . cefTRIAXone (ROCEPHIN)  IV  1 g Intravenous Q24H  . doxycycline  100 mg Oral Q12H  . enoxaparin (LOVENOX) injection  40 mg Subcutaneous Daily  . ferrous sulfate  325 mg Oral Q breakfast  . gabapentin  600 mg Oral TID  . Influenza vac split quadrivalent PF  0.5 mL Intramuscular Tomorrow-1000  . ipratropium-albuterol  3 mL Nebulization Q4H  . loratadine  10 mg Oral Daily  . montelukast  10 mg Oral Daily  . pantoprazole  40 mg Oral Daily  . polyethylene glycol  17 g Oral Daily  . prasugrel  10 mg Oral Daily  . rosuvastatin  40 mg Oral Daily  . sodium chloride  3 mL Intravenous Q12H   Continuous Infusions:  PRN Meds:.acetaminophen **OR** acetaminophen, guaiFENesin-dextromethorphan, ipratropium-albuterol, nitroGLYCERIN, polyethylene glycol   Assessment/Plan:  Gram positive rod bacteremia: One out of two blood cultures showed gram positive rods; I still do not think he is clinically septic so I am leaning toward this being a contaminant from skin flora. 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 I think that ceftriaxone and doxycycline are appropriate for empiric CAP treatment. -Follow culture results -Continue ceftriaxone and doxycycline (stop date 10/26)  Viral bronchitis: His breathing is back to normal, he's not coughing, and has improved quite a bite with gentle hydration and ceftriaxone and Zosyn -Continue ceftriaxone and doxycycline (stop date 10/26) -Bolus to keep MAP > 65  Urinary incontinence: He's requesting a condom catheter because he uses about 8 pads per day. He has a history of prostate cancer and urethral strictures. We'll give him some and he has an appointment with his urologist next month to evaluate him for medications or  discontinuing the caths. -Will discharge home with condom catheters  Physical deconditioning: He's been getting weak from inactivity over the last few months. He was interested in home physical therapy to get some strength back. -Will discharge with home PT  Heart failure with reduced ejection fraction: TTE today. We'll continue holding some of his antihypertensives as his pressures remain soft.  -Follow transthoracic echocardiogram -Continue bisoprolol 2.5 daily -Continue aspirin and prasugrel -Continue rosuvastatin 40mg  -Holding diltiazem, furosemide, ranitidine given his hypotension  COPD: Continues to be well-controlled. -Continue home nebs of symbicort twice daily, duonebs every 6 hours, albuterol as needed -Continue guafenesin-dextromethorphan as needed -Continue loratadine 10mg  daily -Continue montelukast 10mg  daily  Low morning cortisol: His morning cortisol was 6, which I suspect was from HPA axis suppression from his recent course last week. I don't think he has adrenal insufficiency because he didn't feel much better after getting steroids during his COPD exacerbation last week. This could be repeated when he's healthy to evaluated for adrenal insufficiency. There was no adrenal incidentaloma noted on his renal CT. -Consider repeating AM cortisol on outpatient basis  Dispo: Disposition is deferred at this time, awaiting improvement of current medical problems.  Anticipated discharge today  The patient does have a current PCP ([name], MD) and does need an [facility] hospital follow-up appointment after discharge.  The patient does have transportation limitations that hinder transportation to clinic appointments.  .Services Needed at time of discharge: Y = Yes, Blank = No PT:   OT:   RN:   Equipment:   Other:     LOS: 2 days   Loleta Chance, MD 06/12/2015, 8:55 AM

## 2015-06-12 NOTE — Discharge Instructions (Signed)
Acute Bronchitis Bronchitis is when the airways that extend from the windpipe into the lungs get red, puffy, and painful (inflamed). Bronchitis often causes thick spit (mucus) to develop. This leads to a cough. A cough is the most common symptom of bronchitis. In acute bronchitis, the condition usually begins suddenly and goes away over time (usually in 2 weeks). Smoking, allergies, and asthma can make bronchitis worse. Repeated episodes of bronchitis may cause more lung problems. HOME CARE  Rest.  Drink enough fluids to keep your pee (urine) clear or pale yellow (unless you need to limit fluids as told by your doctor).  Only take over-the-counter or prescription medicines as told by your doctor.  Avoid smoking and secondhand smoke. These can make bronchitis worse. If you are a smoker, think about using nicotine gum or skin patches. Quitting smoking will help your lungs heal faster.  Reduce the chance of getting bronchitis again by:  Washing your hands often.  Avoiding people with cold symptoms.  Trying not to touch your hands to your mouth, nose, or eyes.  Follow up with your doctor as told. GET HELP IF: Your symptoms do not improve after 1 week of treatment. Symptoms include:  Cough.  Fever.  Coughing up thick spit.  Body aches.  Chest congestion.  Chills.  Shortness of breath.  Sore throat. GET HELP RIGHT AWAY IF:   You have an increased fever.  You have chills.  You have severe shortness of breath.  You have bloody thick spit (sputum).  You throw up (vomit) often.  You lose too much body fluid (dehydration).  You have a severe headache.  You faint. MAKE SURE YOU:   Understand these instructions.  Will watch your condition.  Will get help right away if you are not doing well or get worse.   This information is not intended to replace advice given to you by your health care provider. Make sure you discuss any questions you have with your health care  provider.   Document Released: 01/28/2008 Document Revised: 04/13/2013 Document Reviewed: 02/01/2013 Elsevier Interactive Patient Education 2016 [place].   Antibiotic Medicine Antibiotic medicines are used to treat infections caused by bacteria. They work by hurting or killing the germs that are making you sick. HOW WILL MY MEDICINE BE PICKED? There are many kinds of antibiotic medicines. To help your doctor pick one, tell your doctor if:  You have any allergies.  You are pregnant or plan to get pregnant.  You are breastfeeding.  You are taking any medicines. These include over-the-counter medicines, prescription medicines, and herbal remedies.  You have a medical condition or problem. If you have questions about why your medicine was picked, ask. FOR HOW LONG SHOULD I TAKE MY MEDICINE? Take your medicine for as long as your doctor tells you to. Do not stop taking it when you feel better. If you stop taking it too soon:  You may start to feel sick again.  Your infection may get harder to treat.  New problems may develop. WHAT IF I MISS A DOSE? Try not to miss any doses of antibiotic medicine. If you miss a dose:  Take the dose as soon as you can.  If you are taking 2 doses a day, take the next dose in 5 to 6 hours.  If you are taking 3 or more doses a day, take the next dose in 2 to 4 hours. Then go back to the normal schedule. If you cannot take a missed dose,  take the next dose on time. Then take the missed dose after you have taken all the doses as told by your doctor, as if you had one more dose left. DOES THIS MEDICINE AFFECT BIRTH CONTROL? Birth control pills may not work while you are on antibiotic medicines. If you are taking birth control pills, keep taking them as usual. Use a second form of birth control, such as a condom. Keep using the second form of birth control until you are finished with your current 1 month cycle of birth control pills. GET HELP  IF:  You get worse.  You do not feel better a few days after starting the medicine.  You throw up (vomit).  There are white patches in your mouth.  You have new joint pain after starting the medicine.  You have new muscle aches after starting the medicine.  You had a fever before starting the medicine, and it comes back.  You have any symptoms of an allergic reaction, such as an itchy rash. If this happens, stop taking the medicine. GET HELP RIGHT AWAY IF:  Your pee (urine) turns dark or becomes blood-colored.  Your skin turns yellow.  You bruise or bleed easily.  You have very bad watery poop (diarrhea) and cramps in your belly (abdomen).  You have a very bad headache.  You have signs of a very bad allergic reaction, such as:  Trouble breathing.  Wheezing.  Swelling of the lips, tongue, or face.  Fainting.  Blisters on the skin or in the mouth. If you have signs of a very bad allergic reaction, stop taking the antibiotic medicine right away.   This information is not intended to replace advice given to you by your health care provider. Make sure you discuss any questions you have with your health care provider.   Document Released: 05/20/2008 Document Revised: 05/02/2015 Document Reviewed: 12/27/2014 Elsevier Interactive Patient Education [employer].

## 2015-06-12 NOTE — Care Management Note (Signed)
Case Management Note  Patient Details  Name: [name] MRN: [id number] Date of Birth: 13-Sep-1934  Subjective/Objective:  Viral PNA                  Action/Plan: NCM spoke to pt and gave permission to speak with wife, [name] [phone] and dtr, [name]. Wife reports pt is active with [facility]. He has his oxygen through the [place]. Pt is requesting rolling walker with seat and 3n1 bedside commode for home. NCM contacted [facility] for DME for home. Offered choice for [facility], pt agreeable to [facility] for HHPT for scheduled dc today. Wife is home with pt to assist with care at home as needed. Contacted [facility] rep for [facility] PT.    Expected Discharge Date:  06/12/2015              Expected Discharge Plan:  [place]  In-House Referral:     Discharge planning Services  CM Consult  Post Acute Care Choice:  Home Health Choice offered to:  Spouse  DME Arranged:  3-N-1, Walker rolling with seat DME Agency:  [place]:  PT [place] Agency:  [place]  Status of Service:  Completed, signed off  Medicare Important Message Given:    Date Medicare IM Given:    Medicare IM give by:    Date Additional Medicare IM Given:    Additional Medicare Important Message give by:     If discussed at [place] of Stay Meetings, dates discussed:    Additional Comments:  Erenest Rasher, RN 06/12/2015, 1:25 PM

## 2015-06-12 NOTE — Progress Notes (Signed)
Attempted twice to give pt ordered suppository; first attempt pt wanted to wait until after breakfast and neb txs, second attempt pt has just finished working with PT and is up in the chair. Per pt he wants to again wait to see if he is discharged home before deciding on having the suppository. Pt given scheduled miralax.

## 2015-06-12 NOTE — Progress Notes (Signed)
Echocardiogram 2D Echocardiogram has been performed.  [name] 06/12/2015, 2:37 PM

## 2015-06-13 ENCOUNTER — Other Ambulatory Visit: Payer: Self-pay | Admitting: *Deleted

## 2015-06-13 DIAGNOSIS — I509 Heart failure, unspecified: Secondary | ICD-10-CM | POA: Diagnosis not present

## 2015-06-13 DIAGNOSIS — J208 Acute bronchitis due to other specified organisms: Secondary | ICD-10-CM | POA: Diagnosis not present

## 2015-06-13 DIAGNOSIS — J449 Chronic obstructive pulmonary disease, unspecified: Secondary | ICD-10-CM | POA: Diagnosis not present

## 2015-06-13 NOTE — Patient Outreach (Signed)
[place] [facility]) Care Management  06/13/2015  [name] November 14, 1934 [id number]  [name] is no longer eligible to be followed by [place] Management as we are not in contract with his particular [facility] plan. I have reached out to [name], leaving a HIPPA secure voice message requesting return call to discuss case closure. In addition, I will help [name] reach out to his [facility] Management representative so the he can establish a case management relationship to address any future needs.    [place] [facility] Management  [phone]

## 2015-06-13 NOTE — Consult Note (Signed)
   [facility] CM Inpatient Consult   06/13/2015  MILT URVINA 25-Oct-1934 [id number] Consult for continued [facility] community care management.   This patient is Not eligible for [facility] Care Management Services.   Reason:  Not a beneficiary currently attributed to one of the [place].  Membership roster was used to verify non- eligible status. Patient's [facility] Care Management notified of current status and no longer able to follow, at this time. [name], RN BSN [facility] Liaison  [phone] business mobile phone

## 2015-06-15 DIAGNOSIS — J449 Chronic obstructive pulmonary disease, unspecified: Secondary | ICD-10-CM | POA: Diagnosis not present

## 2015-06-15 DIAGNOSIS — I509 Heart failure, unspecified: Secondary | ICD-10-CM | POA: Diagnosis not present

## 2015-06-15 DIAGNOSIS — J208 Acute bronchitis due to other specified organisms: Secondary | ICD-10-CM | POA: Diagnosis not present

## 2015-06-16 LAB — CULTURE, BLOOD (ROUTINE X 2): CULTURE: NO GROWTH

## 2015-06-18 ENCOUNTER — Ambulatory Visit: Payer: Medicare Other | Admitting: *Deleted

## 2015-06-18 DIAGNOSIS — J449 Chronic obstructive pulmonary disease, unspecified: Secondary | ICD-10-CM | POA: Diagnosis not present

## 2015-06-18 DIAGNOSIS — I509 Heart failure, unspecified: Secondary | ICD-10-CM | POA: Diagnosis not present

## 2015-06-18 DIAGNOSIS — J208 Acute bronchitis due to other specified organisms: Secondary | ICD-10-CM | POA: Diagnosis not present

## 2015-06-20 DIAGNOSIS — I509 Heart failure, unspecified: Secondary | ICD-10-CM | POA: Diagnosis not present

## 2015-06-20 DIAGNOSIS — Z1389 Encounter for screening for other disorder: Secondary | ICD-10-CM | POA: Diagnosis not present

## 2015-06-20 DIAGNOSIS — J449 Chronic obstructive pulmonary disease, unspecified: Secondary | ICD-10-CM | POA: Diagnosis not present

## 2015-06-21 DIAGNOSIS — J208 Acute bronchitis due to other specified organisms: Secondary | ICD-10-CM | POA: Diagnosis not present

## 2015-06-21 DIAGNOSIS — I509 Heart failure, unspecified: Secondary | ICD-10-CM | POA: Diagnosis not present

## 2015-06-21 DIAGNOSIS — J449 Chronic obstructive pulmonary disease, unspecified: Secondary | ICD-10-CM | POA: Diagnosis not present

## 2015-06-21 NOTE — Patient Outreach (Signed)
[facility]) Care Management  06/14/2015  JADEVEON BOULET [birth year] [id number]   Notification from [name], RN to close case as patient is no longer eligible for [place] Management services.  Thanks, Ronnell Freshwater. [place], [place] Assistant Phone: [phone] Fax: [phone]

## 2015-06-26 DIAGNOSIS — J449 Chronic obstructive pulmonary disease, unspecified: Secondary | ICD-10-CM | POA: Diagnosis not present

## 2015-06-26 DIAGNOSIS — J208 Acute bronchitis due to other specified organisms: Secondary | ICD-10-CM | POA: Diagnosis not present

## 2015-06-26 DIAGNOSIS — I509 Heart failure, unspecified: Secondary | ICD-10-CM | POA: Diagnosis not present

## 2015-06-28 ENCOUNTER — Ambulatory Visit (HOSPITAL_COMMUNITY)
Admission: RE | Admit: 2015-06-28 | Discharge: 2015-06-28 | Disposition: A | Discharge status: Home / Self Care | Payer: Medicare Other | Source: Ambulatory Visit | Attending: Family Medicine | Admitting: Family Medicine

## 2015-06-28 ENCOUNTER — Other Ambulatory Visit (HOSPITAL_COMMUNITY): Payer: Self-pay | Admitting: Family Medicine

## 2015-06-28 DIAGNOSIS — J208 Acute bronchitis due to other specified organisms: Secondary | ICD-10-CM | POA: Diagnosis not present

## 2015-06-28 DIAGNOSIS — R0989 Other specified symptoms and signs involving the circulatory and respiratory systems: Secondary | ICD-10-CM | POA: Diagnosis not present

## 2015-06-28 DIAGNOSIS — R0609 Other forms of dyspnea: Secondary | ICD-10-CM | POA: Diagnosis not present

## 2015-06-28 DIAGNOSIS — R0489 Hemorrhage from other sites in respiratory passages: Secondary | ICD-10-CM

## 2015-06-28 DIAGNOSIS — I509 Heart failure, unspecified: Secondary | ICD-10-CM | POA: Diagnosis not present

## 2015-06-28 DIAGNOSIS — J449 Chronic obstructive pulmonary disease, unspecified: Secondary | ICD-10-CM | POA: Diagnosis not present

## 2015-07-06 DIAGNOSIS — L729 Follicular cyst of the skin and subcutaneous tissue, unspecified: Secondary | ICD-10-CM | POA: Diagnosis not present

## 2015-07-06 DIAGNOSIS — Z1389 Encounter for screening for other disorder: Secondary | ICD-10-CM | POA: Diagnosis not present

## 2015-07-06 DIAGNOSIS — D692 Other nonthrombocytopenic purpura: Secondary | ICD-10-CM | POA: Diagnosis not present

## 2015-07-09 ENCOUNTER — Ambulatory Visit (INDEPENDENT_AMBULATORY_CARE_PROVIDER_SITE_OTHER): Payer: Medicare Other | Admitting: Cardiovascular Disease

## 2015-07-09 VITALS — BP 104/60 | HR 88 | Ht 67.0 in | Wt 216.2 lb

## 2015-07-09 DIAGNOSIS — I2510 Atherosclerotic heart disease of native coronary artery without angina pectoris: Secondary | ICD-10-CM | POA: Diagnosis not present

## 2015-07-09 DIAGNOSIS — I252 Old myocardial infarction: Secondary | ICD-10-CM

## 2015-07-09 DIAGNOSIS — E669 Obesity, unspecified: Secondary | ICD-10-CM

## 2015-07-09 DIAGNOSIS — I255 Ischemic cardiomyopathy: Secondary | ICD-10-CM

## 2015-07-09 DIAGNOSIS — J449 Chronic obstructive pulmonary disease, unspecified: Secondary | ICD-10-CM | POA: Diagnosis not present

## 2015-07-09 NOTE — Patient Instructions (Signed)
If your blood pressures get low, you can cut the Hytrin in half.  Dr [name] recommends that you schedule a follow-up appointment in 4 months.

## 2015-07-13 ENCOUNTER — Encounter: Payer: Self-pay | Admitting: Cardiovascular Disease

## 2015-07-15 ENCOUNTER — Encounter: Payer: Self-pay | Admitting: Cardiovascular Disease

## 2015-07-15 DIAGNOSIS — I255 Ischemic cardiomyopathy: Secondary | ICD-10-CM | POA: Insufficient documentation

## 2015-07-15 NOTE — Progress Notes (Signed)
Patient ID: [name], male   DOB: September 24, 1934, 80 y.o.   MRN: [id number]     HPI: [name] is a 80 y.o. male who presents to the office today for a 6 week follow-up evaluation.  I had not seen him in over 3 years when he reestablished with me in 05/29/2015.  [name] has a history of severe obstructive lung disease with chronic respiratory failure on oxygen supplementation and has been followed by Dr. Danton Sewer.   He has known CAD and suffered a initial large anterior wall myocardial infarction in 2006 and suffered a repeat MI in October 2012.  At that time his LAD was occluded at the proximal stent site from 2006.  He required repeat intervention in December 2012.  Due to persistent LV dysfunction despite increased medical therapy a Medtronic Protecta XT defibrillator was inserted in light of severe ischemic cardomyopathy.  The following day he had lead dislodgment and lead revision was performed on 02/10/2012.  He has been on platelet therapy and when last seen in 2013, was on aspirin and Brilinta.  Apparently, he is now on Effient 10 mg daily.  Over the past 3 years he had  been followed at the [facility] for his Cardiologic care and he also has also been seen by them for pulmonary care.  When I saw him last month, he denied significant anginal type symptomatology.  However, he had been markedly limited by his significant COPD.  He had been treated in [place] with COPD exacerbation   The patient has had significant difficulty with wheezing with minimal activity.  Typically he is on 3 L of oxygen at rest and 6 L of oxygen when he attempts to walk.  However, today he has increased his oxygen 5 L.  He tells me he believes he had an echo Doppler study done at the [facility] within the last 2 months.  I do not have any records from the [facility].  He also tells me that he will try to be [facility] since he understands that Dr. [name] is now there.   He is unaware of any defibrillator discharge.    He tells he recently saw Dr. [name] in follow-up of his prostate CA.  I saw him, I felt he had significant confusion about his COPD medications and was not well compensated.  I referred him to Dr. Shyrl Numbers who we saw the following day.  I reviewed Dr. Gustavus Bryant office note an significant changes were made to his pulmonary regimen.  Subsequently, the patient was hospitalized with urinary incontinence, fever  and acute bronchitis.  His urinary incontinence was caused by urethral stricture.  He tells me he recently was seen at [place] and was told of possibly having blisters on his left arm , possible shingles.  He underwent a follow-up 2-D echo Doppler study in 06/12/2015.  This showed mild dilation of the LV with an ejection fraction of 35-40% with diffuse hypokinesis.  The echo was poor quality and segmental wall motion could not be ascertained.  Right ventricular ejection fraction was normal.  Presenly, he is breathing better.  He denies chest pain.  He is unaware of palpitations.  He presents for cardiology follow-up evaluation.  Past Medical History  Diagnosis Date  . Myocardial infarction ([place])   . Asthma   . COPD (chronic obstructive pulmonary disease) ([place])   . Emphysema   . CAD 04/23/2009  .  Anticoagulated on warfarin 07/17/2011  . CHF (congestive heart failure) ([place])   . Ventricular thrombus following MI (myocardial infarction) ([place]) 11/12    on coumadin  . Angina   . Shortness of breath   . Chronic kidney disease   . Cancer ([place])   . Hypertension   . Blood transfusion 3 weeks ago  . GERD (gastroesophageal reflux disease)   . Arthritis   . Pneumonia   . Anemia   . Atherosclerosis of native arteries of the extremities with intermittent claudication 08/13/2011  . Heart attack [facility]) October 27,2012 and July 28, 2011  . Heart attack [facility]) October  27,2012 and December 3,2012   . ICD (implantable cardiac defibrillator) in  place   . S/P ICD (internal cardiac defibrillator) procedure, 6.17.13 02/10/2012    Past Surgical History  Procedure Laterality Date  . Coronary stent placement  06/21/11  . Cardiac catheterization    . Groin debridement  07/18/2011    Procedure: GROIN DEBRIDEMENT;  Surgeon: Angelia Mould, MD;  Location: [facility] OR;  Service: Vascular;  Laterality: Right;  exploration of right groin,evacuation of right groin hematoma & repair of right superficial femoral artery  . Coronary angioplasty    . Insert / replace / remove pacemaker  02/09/2012    ICD  . Coronary angiogram N/A 07/28/2011    Procedure: CORONARY ANGIOGRAM;  Surgeon: Leonie Man, MD;  Location: [facility] CATH LAB;  Service: Cardiovascular;  Laterality: N/A;  . Percutaneous coronary stent intervention (pci-s) N/A 07/28/2011    Procedure: PERCUTANEOUS CORONARY STENT INTERVENTION (PCI-S);  Surgeon: Leonie Man, MD;  Location: [facility] CATH LAB;  Service: Cardiovascular;  Laterality: N/A;  . Implantable cardioverter defibrillator implant N/A 02/09/2012    Procedure: IMPLANTABLE CARDIOVERTER DEFIBRILLATOR IMPLANT;  Surgeon: Sanda Klein, MD;  Location: [place] CATH LAB;  Service: Cardiovascular;  Laterality: N/A;  . Lead revision N/A 02/10/2012    Procedure: LEAD REVISION;  Surgeon: Sanda Klein, MD;  Location: [facility] CATH LAB;  Service: Cardiovascular;  Laterality: N/A;    Allergies  Allergen Reactions  . Demerol Other (See Comments)    Abnormal behavior  . Zolpidem Tartrate Other (See Comments)    Pt. Became confused and aggitated    Current Outpatient Prescriptions  Medication Sig Dispense Refill  . aspirin 81 MG tablet Take 81 mg by mouth daily.    . bisoprolol (ZEBETA) 5 MG tablet Take 0.5 tablets (2.5 mg total) by mouth daily. 30 tablet 3  . budesonide-formoterol (SYMBICORT) 160-4.5 MCG/ACT inhaler Inhale 2 puffs into the lungs 2 (two) times daily.    . cetirizine (ZYRTEC) 10 MG tablet Take 10 mg by mouth daily.    Marland Kitchen diltiazem  (CARDIZEM) 120 MG tablet Take 120 mg by mouth daily.    Marland Kitchen EPINEPHrine 0.3 mg/0.3 mL IJ SOAJ injection Inject into the muscle once.    . ferrous sulfate 325 (65 FE) MG tablet Take 325 mg by mouth daily with breakfast.    . furosemide (LASIX) 40 MG tablet Take 40 mg by mouth as needed for fluid.    Marland Kitchen gabapentin (NEURONTIN) 600 MG tablet Take 600 mg by mouth 3 (three) times daily.    Marland Kitchen ipratropium-albuterol (DUONEB) 0.5-2.5 (3) MG/3ML SOLN Take 3 mLs by nebulization every 4 (four) hours as needed.    . levalbuterol (XOPENEX HFA) 45 MCG/ACT inhaler Inhale into the lungs every 4 (four) hours as needed for wheezing.    . levalbuterol (XOPENEX) 0.63 MG/3ML nebulizer solution Take 1 ampule by nebulization 4 (  four) times daily. For shortness of breath    . montelukast (SINGULAIR) 10 MG tablet Take 10 mg by mouth daily.      . nitroGLYCERIN (NITROSTAT) 0.4 MG SL tablet Place 0.4 mg under the tongue every 5 (five) minutes as needed. For chest pain    . omalizumab (XOLAIR) 150 MG injection Inject 150 mg into the skin every 28 (twenty-eight) days.    Marland Kitchen omeprazole (PRILOSEC) 20 MG capsule Take 20 mg by mouth daily.    . potassium chloride (MICRO-K) 10 MEQ CR capsule Take 10 mEq by mouth.    . prasugrel (EFFIENT) 10 MG TABS tablet Take 10 mg by mouth daily.    . ranitidine (ZANTAC) 150 MG capsule Take 150 mg by mouth 2 (two) times daily.    . rosuvastatin (CRESTOR) 40 MG tablet Take 40 mg by mouth daily.    Marland Kitchen sulfamethoxazole-trimethoprim (BACTRIM DS,SEPTRA DS) 800-160 MG tablet Take 1 tablet by mouth 2 (two) times daily.    Marland Kitchen terazosin (HYTRIN) 2 MG capsule Take 2 mg by mouth at bedtime.     . triamcinolone cream (KENALOG) 0.5 % Apply 1 application topically 2 (two) times daily.    . valACYclovir (VALTREX) 500 MG tablet Take 1,000 mg by mouth 3 (three) times daily.     No current facility-administered medications for this visit.    Social History   Social History  . Marital Status: Married    Spouse  Name: N/A  . Number of Children: N/A  . Years of Education: N/A   Occupational History  . retired    Social History Main Topics  . Smoking status: Former Smoker -- 3.00 packs/day for 40 years    Types: Cigarettes    Quit date: 04/13/2005  . Smokeless tobacco: Never Used  . Alcohol Use: No  . Drug Use: No  . Sexual Activity: No   Other Topics Concern  . Not on file   Social History Narrative   Additional social history is notable in that he is married for 43 years.  He has 5 children, 4 grandchildren 1 great-grandchild.  He is a retired Dealer.  He quit smoking in 2006 but previously had smoked up to 3-4 packs per day for up to 45 years.  He is not exercise.  Family History  Problem Relation Age of Onset  . Diabetes type II Mother   . Coronary artery disease Mother   . Coronary artery disease Father   . Diabetes type II Brother   . Coronary artery disease Brother   . Craniosynostosis Neg Hx   . Heart attack Mother   . Hypertension Sister   . Stroke Neg Hx    Additional family history is notable that his mother died at 62 with a heart attack.  Father died at age 40.  One brother was killed in the [place] War.  His 2 sisters are deceased, one with Alzheimer's disease and the other secondary to cancer.  ROS General: Negative; No fevers, chills, or night sweats HEENT: Negative; No changes in vision or hearing, sinus congestion, difficulty swallowing Pulmonary: Audible wheezing; positive for severe COPD Cardiovascular: See HPI:  GI: Positive for GERD GU: Recent urinary incontinence secondary to urethral stricture followed by urology Musculoskeletal: Negative; no myalgias, joint pain, or weakness Hematologic: Negative; no easy bruising, bleeding Endocrine: Negative; no heat/cold intolerance; no diabetes, Neuro: Negative; no changes in balance, headaches Skin: Negative; No rashes or skin lesions Psychiatric: Negative; No behavioral problems, depression Sleep: Negative; No  snoring,  daytime sleepiness, hypersomnolence, bruxism, restless legs, hypnogognic hallucinations. Other comprehensive 14 point system review is negative   Physical Exam BP 104/60 mmHg  Pulse 88  Ht $R'5\' 7"'kV$  (1.702 m)  Wt 216 lb 3.2 oz (98.068 kg)  BMI 33.85 kg/m2 Wt Readings from Last 3 Encounters:  07/09/15 216 lb 3.2 oz (98.068 kg)  06/11/15 217 lb 11.2 oz (98.748 kg)  05/30/15 217 lb (98.431 kg)   General: When I entered the room.  He was using a inhaler for his wheezing Skin: normal turgor, no rashes, warm and dry HEENT: Normocephalic, atraumatic. Pupils equal round and reactive to light; sclera anicteric; extraocular muscles intact, No lid lag; Nose without nasal septal hypertrophy; Mouth/Parynx benign; Mallinpatti scale 3 Neck: No JVD, no carotid bruits; normal carotid upstroke Lungs:  No active wheezing today on exam, markedly improved from previously Chest wall: without tenderness to palpitation Heart: PMI not displaced, RRR, s1 s2 normal, 1/6 systolic murmur, No diastolic murmur, no rubs, gallops, thrills, or heaves Abdomen: Moderate obesity; soft, nontender; no hepatosplenomehaly, BS+; abdominal aorta nontender and not dilated by palpation. Back: no CVA tenderness Pulses: 2+  Musculoskeletal: full range of motion, normal strength, no joint deformities Extremities: Slightly decreased pulses distally.  Trace ankle edema., no clubbing cyanosis or edema, Homan's sign negative  Neurologic: grossly nonfocal; Cranial nerves grossly wnl Psychologic: Normal mood and affect  ECG (independently read by me): Normal sinus rhythm at 88 bpm.  Right bundle-branch block with repolarization changes.  QRS complex V1 and V2 suggestive of prior septal infarct.  05/29/2015 ECG (independently read by me): Normal sinus rhythm at 89 bpm.  Right bundle-branch block with repolarization changes.  Q waves V1 through V4 concordant with old anterior MI.  LABS:  BMP Latest Ref Rng 06/12/2015 06/11/2015  06/10/2015  Glucose 65 - 99 mg/dL 136(H) 106(H) 127(H)  BUN 6 - 20 mg/dL 16 17 21(H)  Creatinine 0.61 - 1.24 mg/dL 1.45(H) 1.50(H) 1.52(H)  Sodium 135 - 145 mmol/L 138 139 135  Potassium 3.5 - 5.1 mmol/L 3.8 4.0 4.9  Chloride 101 - 111 mmol/L 100(L) 100(L) 98(L)  CO2 22 - 32 mmol/L $RemoveB'26 27 28  'QREdZPHk$ Calcium 8.9 - 10.3 mg/dL 8.3(L) 8.3(L) 7.9(L)     Hepatic Function Latest Ref Rng 06/10/2015 05/29/2015 07/28/2011  Total Protein 6.5 - 8.1 g/dL 5.8(L) 5.7(L) 5.7(L)  Albumin 3.5 - 5.0 g/dL 2.9(L) 3.8 3.0(L)  AST 15 - 41 U/L 32 18 16  ALT 17 - 63 U/L $Remo'21 22 15  'KxSaY$ Alk Phosphatase 38 - 126 U/L 47 56 53  Total Bilirubin 0.3 - 1.2 mg/dL 1.2 0.8 0.2(L)  Bilirubin, Direct 0.0 - 0.3 mg/dL - - -    CBC Latest Ref Rng 06/12/2015 06/11/2015 06/10/2015  WBC 4.0 - 10.5 K/uL 9.5 8.2 8.3  Hemoglobin 13.0 - 17.0 g/dL 11.6(L) 11.4(L) 11.8(L)  Hematocrit 39.0 - 52.0 % 34.8(L) 35.0(L) 36.3(L)  Platelets 150 - 400 K/uL 171 157 168   Lab Results  Component Value Date   MCV 97.8 06/12/2015   MCV 98.3 06/11/2015   MCV 98.9 06/10/2015    Lab Results  Component Value Date   TSH 0.827 05/29/2015    BNP    Component Value Date/Time   BNP 76.8 06/10/2015 1414    ProBNP    Component Value Date/Time   PROBNP 1124.0* 07/23/2011 0522     Lipid Panel     Component Value Date/Time   CHOL 126 07/28/2011 1205   TRIG 189* 07/28/2011 1205  HDL 34* 07/28/2011 1205   CHOLHDL 3.7 07/28/2011 1205   VLDL 38 07/28/2011 1205   LDLCALC 54 07/28/2011 1205     RADIOLOGY: Dg Chest 2 View  06/28/2015  CLINICAL DATA:  Difficulty breathing, congestion. EXAM: CHEST  2 VIEW COMPARISON:  June 11, 2015. FINDINGS: Stable cardiomediastinal silhouette. Left-sided pacemaker is unchanged in position. No pneumothorax or pleural effusion is noted. Stable bibasilar scarring is noted. Stable calcified granuloma seen in right midlung. No acute pulmonary disease is noted. Bony thorax is unremarkable. IMPRESSION: No active  cardiopulmonary disease. Electronically Signed   By: Marijo Conception, M.D.   On: 06/28/2015 16:31      ASSESSMENT AND PLAN: Mr. Divine Hansley is a 80 year old male who has severe COPD with chronic respiratory failure and a history of nonischemic cardiomyopathy secondary to an initial large anterior wall myocardial infarction sustained in 2006 and subsequent MI in 2012.  He has a defibrillator for sudden death prophylaxis per [facility]-[employer] data.  He is unaware of any recent defibrillator discharges.  I reviewed his most recent echo Doppler study with him in detail.  This showed an ejection fraction of 35-40% with diffuse hypokinesis.  His LV was mildly dilated.  When I saw him one month ago after not having seen him in over 3 years.  He had significant decompensation with reference to pulmonary status.  At that time, he was wheezing.  I discontinued his carvedilol and initiated, bisoprolol, which has been beneficial.  He also underwent medications were adjusted.  He now is being treated for possible shingles and is on Valtrex prescribed recently at [facility].  He is not having any anginal symptoms.  Presently he's not wheezing.  Blood pressure today is stable, although on the low side at 104/60.Marland Kitchen  However, if he does note dizziness or lightheadedness he can reduce his Hytrin dose by one half.  He will continue his current bisoprolol 2.5 mg, diltiazem 120 mg.  He takes Lasix on an as-needed basis.  He continues to be on Effient for antiplatelet therapy with baby aspirin and denies bleeding.  He is on Crestor for hyperlipidemia and is tolerating this without myalgias.  He plans to establish with the [facility] in [place].  I will see him in 4 months for f/u cardiology reevaluation.   Troy Sine, MD, [facility]  07/15/2015 10:50 AM

## 2015-09-17 DIAGNOSIS — N200 Calculus of kidney: Secondary | ICD-10-CM | POA: Diagnosis not present

## 2015-09-17 DIAGNOSIS — C61 Malignant neoplasm of prostate: Secondary | ICD-10-CM | POA: Diagnosis not present

## 2015-09-17 DIAGNOSIS — N320 Bladder-neck obstruction: Secondary | ICD-10-CM | POA: Diagnosis not present

## 2015-09-17 DIAGNOSIS — N35013 Post-traumatic anterior urethral stricture: Secondary | ICD-10-CM | POA: Diagnosis not present

## 2015-11-08 ENCOUNTER — Ambulatory Visit (INDEPENDENT_AMBULATORY_CARE_PROVIDER_SITE_OTHER): Payer: Medicare Other | Admitting: Cardiovascular Disease

## 2015-11-08 ENCOUNTER — Encounter: Payer: Self-pay | Admitting: Cardiovascular Disease

## 2015-11-08 VITALS — BP 120/78 | HR 68 | Ht 67.0 in | Wt 219.3 lb

## 2015-11-08 DIAGNOSIS — I10 Essential (primary) hypertension: Secondary | ICD-10-CM

## 2015-11-08 DIAGNOSIS — J449 Chronic obstructive pulmonary disease, unspecified: Secondary | ICD-10-CM | POA: Diagnosis not present

## 2015-11-08 DIAGNOSIS — I255 Ischemic cardiomyopathy: Secondary | ICD-10-CM | POA: Diagnosis not present

## 2015-11-08 NOTE — Progress Notes (Signed)
Patient ID: [name], male   DOB: 02/01/1935, 81 y.o.   MRN: [id number]     HPI: [name] is a 81 y.o. male who presents to the office today for a 4 month follow-up evaluation.  I had not seen him in over 3 years when he reestablished with me in 05/29/2015.  [name] has a history of severe obstructive lung disease with chronic respiratory failure on oxygen supplementation and has been followed by [name].   He has known CAD and suffered a initial large anterior wall myocardial infarction in 2006 and suffered a repeat MI in October 2012.  At that time his LAD was occluded at the proximal stent site from 2006.  He required repeat intervention in December 2012.  Due to persistent LV dysfunction despite increased medical therapy a Medtronic Protecta XT defibrillator was inserted in light of severe ischemic cardomyopathy.  The following day he had lead dislodgment and lead revision was performed on 02/10/2012.  He has been on platelet therapy and when last seen in 2013, was on aspirin and Brilinta.  Apparently, he is now on Effient 10 mg daily.  Over the past 3 years he had  been followed at the [facility] for his Cardiologic care and he also has also been seen by them for pulmonary care.  When I saw him last month, he denied significant anginal type symptomatology.  However, he had been markedly limited by his significant COPD.  He had been treated in [place] with COPD exacerbation   The patient has had significant difficulty with wheezing with minimal activity.  Typically he is on 3 L of oxygen at rest and 6 L of oxygen when he attempts to walk.  However, today he has increased his oxygen 5 L.  He tells me he believes he had an echo Doppler study done at the [facility] within the last 2 months.  I do not have any records from the [facility].  He also tells me that he will [facility] to be [facility] since he understands that Dr. Gwenette [name] is now  there.  He is unaware of any defibrillator discharge.    He tells he recently saw [name] in follow-up of his prostate CA.  When I saw him in October 2016, I felt he had significant confusion about his COPD medications and was not well compensated.  I referred him to [name] who we saw the following day.  I reviewed Dr. Gustavus [name] office note an significant changes were made to his pulmonary regimen.  Subsequently, the patient was hospitalized with urinary incontinence, fever  and acute bronchitis.  His urinary incontinence was caused by urethral stricture.  He tells me he recently was seen at [place] and was told of possibly having blisters on his left arm , possible shingles.   He underwent a follow-up 2-D echo Doppler study in 06/12/2015.  This showed mild dilation of the LV with an ejection fraction of 35-40% with diffuse hypokinesis.  The echo was poor quality and segmental wall motion could not be ascertained.  Right ventricular ejection fraction was normal.  [name] has now reestablished care with [name] clamps at the [facility] who was his primary pulmonologist when he was hearing [name].  He now is on supplemental liquid oxygen but has a oxygen concentrator for sleep. He denies any recent episodes of chest pain.  His breathing is significant leak improved.  He is unaware of significant issues with breakthrough wheezing.  He denies palpitations.  He denies presyncope or syncope.   Past Medical History  Diagnosis Date  . Myocardial infarction ([place])   . Asthma   . COPD (chronic obstructive pulmonary disease) ([place])   . Emphysema   . CAD 04/23/2009  . Anticoagulated on warfarin 07/17/2011  . CHF (congestive heart failure) ([place])   . Ventricular thrombus following MI (myocardial infarction) ([place]) 11/12    on coumadin  . Angina   . Shortness of breath   . Chronic kidney disease   . Cancer ([place])   . Hypertension   . Blood transfusion 3 weeks ago  . GERD  (gastroesophageal reflux disease)   . Arthritis   . Pneumonia   . Anemia   . Atherosclerosis of native arteries of the extremities with intermittent claudication 08/13/2011  . Heart attack [facility]) October 27,2012 and July 28, 2011  . Heart attack [facility]) October  27,2012 and December 3,2012   . ICD (implantable cardiac defibrillator) in place   . S/P ICD (internal cardiac defibrillator) procedure, 6.17.13 02/10/2012    Past Surgical History  Procedure Laterality Date  . Coronary stent placement  06/21/11  . Cardiac catheterization    . Groin debridement  07/18/2011    Procedure: GROIN DEBRIDEMENT;  Surgeon: Angelia Mould, MD;  Location: [facility] OR;  Service: Vascular;  Laterality: Right;  exploration of right groin,evacuation of right groin hematoma & repair of right superficial femoral artery  . Coronary angioplasty    . Insert / replace / remove pacemaker  02/09/2012    ICD  . Coronary angiogram N/A 07/28/2011    Procedure: CORONARY ANGIOGRAM;  Surgeon: Leonie Man, MD;  Location: [facility] CATH LAB;  Service: Cardiovascular;  Laterality: N/A;  . Percutaneous coronary stent intervention (pci-s) N/A 07/28/2011    Procedure: PERCUTANEOUS CORONARY STENT INTERVENTION (PCI-S);  Surgeon: Leonie Man, MD;  Location: [facility] CATH LAB;  Service: Cardiovascular;  Laterality: N/A;  . Implantable cardioverter defibrillator implant N/A 02/09/2012    Procedure: IMPLANTABLE CARDIOVERTER DEFIBRILLATOR IMPLANT;  Surgeon: Sanda Klein, MD;  Location: [place] CATH LAB;  Service: Cardiovascular;  Laterality: N/A;  . Lead revision N/A 02/10/2012    Procedure: LEAD REVISION;  Surgeon: Sanda Klein, MD;  Location: [place] CATH LAB;  Service: Cardiovascular;  Laterality: N/A;    Allergies  Allergen Reactions  . Demerol Other (See Comments)    Abnormal behavior  . Zolpidem Tartrate Other (See Comments)    Pt. Became confused and aggitated    Current Outpatient Prescriptions  Medication Sig Dispense Refill  .  albuterol (PROAIR HFA) 108 (90 Base) MCG/ACT inhaler Inhale 2 puffs into the lungs every 6 (six) hours as needed. wheezing    . albuterol (PROVENTIL) (2.5 MG/3ML) 0.083% nebulizer solution Take 2.5 mg by nebulization every 6 (six) hours as needed. wheezing    . aspirin 81 MG tablet Take 81 mg by mouth daily.    . bisoprolol (ZEBETA) 5 MG tablet Take 0.5 tablets (2.5 mg total) by mouth daily. 30 tablet 3  . budesonide-formoterol (SYMBICORT) 160-4.5 MCG/ACT inhaler Inhale 2 puffs into the lungs 2 (two) times daily.    . carisoprodol-aspirin-codeine (SOMA COMPOUND WITH CODEINE) 200-325-16 MG TABS tablet Take 1 tablet by mouth 3 (three) times daily as needed (muscle spasms).    . cetirizine (ZYRTEC) 10 MG tablet Take 10 mg by mouth daily.    . Cholecalciferol (VITAMIN D-1000 MAX ST) 1000 units tablet Take 1,000 Units  by mouth daily.    Marland Kitchen diltiazem (CARDIZEM) 120 MG tablet Take 120 mg by mouth daily.    . ferrous sulfate 325 (65 FE) MG tablet Take 325 mg by mouth daily with breakfast.    . flunisolide (NASAREL) 29 MCG/ACT (0.025%) nasal spray Place 2 sprays into the nose 2 (two) times daily.    . fluticasone (FLOVENT HFA) 220 MCG/ACT inhaler Inhale 1 puff into the lungs 2 (two) times daily.    . furosemide (LASIX) 40 MG tablet Take 40 mg by mouth as needed for fluid.    Marland Kitchen gabapentin (NEURONTIN) 600 MG tablet Take 600 mg by mouth 3 (three) times daily.    Marland Kitchen HYDROcodone-acetaminophen (NORCO) 10-325 MG tablet Take 1 tablet by mouth 4 (four) times daily as needed. pain    . ipratropium-albuterol (DUONEB) 0.5-2.5 (3) MG/3ML SOLN Take 3 mLs by nebulization every 4 (four) hours as needed.    . levalbuterol (XOPENEX HFA) 45 MCG/ACT inhaler Inhale into the lungs every 4 (four) hours as needed for wheezing.    . levalbuterol (XOPENEX) 0.63 MG/3ML nebulizer solution Take 1 ampule by nebulization 4 (four) times daily. For shortness of breath    . montelukast (SINGULAIR) 10 MG tablet Take 10 mg by mouth daily.        . Multiple Vitamin (MULTI-VITAMINS) TABS Take 1 tablet by mouth 2 (two) times daily.    . nitroGLYCERIN (NITROSTAT) 0.4 MG SL tablet Place 0.4 mg under the tongue every 5 (five) minutes as needed. For chest pain    . omeprazole (PRILOSEC) 20 MG capsule Take 20 mg by mouth daily.    . potassium chloride (MICRO-K) 10 MEQ CR capsule Take 10 mEq by mouth.    . prasugrel (EFFIENT) 10 MG TABS tablet Take 10 mg by mouth daily.    . ranitidine (ZANTAC) 150 MG capsule Take 150 mg by mouth 2 (two) times daily.    . rosuvastatin (CRESTOR) 40 MG tablet Take 40 mg by mouth daily.    Marland Kitchen terazosin (HYTRIN) 2 MG capsule Take 2 mg by mouth at bedtime.     . triamcinolone cream (KENALOG) 0.5 % Apply 1 application topically 2 (two) times daily.    . valACYclovir (VALTREX) 500 MG tablet Take 1,000 mg by mouth 3 (three) times daily.     No current facility-administered medications for this visit.    Social History   Social History  . Marital Status: Married    Spouse Name: N/A  . Number of Children: N/A  . Years of Education: N/A   Occupational History  . retired    Social History Main Topics  . Smoking status: Former Smoker -- 3.00 packs/day for 40 years    Types: Cigarettes    Quit date: 04/13/2005  . Smokeless tobacco: Never Used  . Alcohol Use: No  . Drug Use: No  . Sexual Activity: No   Other Topics Concern  . Not on file   Social History Narrative   Additional social history is notable in that he is married for 43 years.  He has 5 children, 4 grandchildren 1 great-grandchild.  He is a retired Dealer.  He quit smoking in 2006 but previously had smoked up to 3-4 packs per day for up to 45 years.  He is not exercise.  Family History  Problem Relation Age of Onset  . Diabetes type II Mother   . Coronary artery disease Mother   . Coronary artery disease Father   . Diabetes type  II Brother   . Coronary artery disease Brother   . Craniosynostosis Neg Hx   . Heart attack Mother   .  Hypertension Sister   . Stroke Neg Hx    Additional family history is notable that his mother died at 73 with a heart attack.  Father died at age 12.  One brother was killed in the [place] War.  His 2 sisters are deceased, one with Alzheimer's disease and the other secondary to cancer.  ROS General: Negative; No fevers, chills, or night sweats HEENT: Negative; No changes in vision or hearing, sinus congestion, difficulty swallowing Pulmonary:  positive for severe COPD Cardiovascular: See HPI:  GI: Positive for GERD GU: Recent urinary incontinence secondary to urethral stricture followed by urology Musculoskeletal: Negative; no myalgias, joint pain, or weakness Hematologic: Negative; no easy bruising, bleeding Endocrine: Negative; no heat/cold intolerance; no diabetes, Neuro: Negative; no changes in balance, headaches Skin: Negative; No rashes or skin lesions Psychiatric: Negative; No behavioral problems, depression Sleep: Negative; No snoring,  daytime sleepiness, hypersomnolence, bruxism, restless legs, hypnogognic hallucinations. Other comprehensive 14 point system review is negative   Physical Exam BP 120/78 mmHg  Pulse 68  Ht '5\' 7"'$  (1.702 m)  Wt 219 lb 4.8 oz (99.474 kg)  BMI 34.34 kg/m2 Wt Readings from Last 3 Encounters:  11/08/15 219 lb 4.8 oz (99.474 kg)  07/09/15 216 lb 3.2 oz (98.068 kg)  06/11/15 217 lb 11.2 oz (98.748 kg)   General: When I entered the room.  He was using a inhaler for his wheezing Skin: normal turgor, no rashes, warm and dry HEENT: Normocephalic, atraumatic. Pupils equal round and reactive to light; sclera anicteric; extraocular muscles intact, No lid lag; Nose without nasal septal hypertrophy; Mouth/Parynx benign; Mallinpatti scale 3 Neck: No JVD, no carotid bruits; normal carotid upstroke Lungs:  No active wheezing today on exam, markedly improved from previously Chest wall: without tenderness to palpitation Heart: PMI not displaced, RRR, s1 s2  normal, 1/6 systolic murmur, No diastolic murmur, no rubs, gallops, thrills, or heaves Abdomen: Moderate obesity; soft, nontender; no hepatosplenomehaly, BS+; abdominal aorta nontender and not dilated by palpation. Back: no CVA tenderness Pulses: 2+  Musculoskeletal: full range of motion, normal strength, no joint deformities Extremities: Slightly decreased pulses distally.  Trace ankle edema., no clubbing cyanosis or edema, Homan's sign negative  Neurologic: grossly nonfocal; Cranial nerves grossly wnl Psychologic: Normal mood and affect  ECG (independently read by me):  Normal sinus rhythm at 68 bpm.  Right bundle branch block with repolarization changes.  Old anteroseptal MI with Q waves V1 through V3 in his right bundle-branch leads.  November 2016 ECG (independently read by me): Normal sinus rhythm at 88 bpm.  Right bundle-branch block with repolarization changes.  QRS complex V1 and V2 suggestive of prior septal infarct.  05/29/2015 ECG (independently read by me): Normal sinus rhythm at 89 bpm.  Right bundle-branch block with repolarization changes.  Q waves V1 through V4 concordant with old anterior MI.  LABS:  BMP Latest Ref Rng 06/12/2015 06/11/2015 06/10/2015  Glucose 65 - 99 mg/dL 136(H) 106(H) 127(H)  BUN 6 - 20 mg/dL 16 17 21(H)  Creatinine 0.61 - 1.24 mg/dL 1.45(H) 1.50(H) 1.52(H)  Sodium 135 - 145 mmol/L 138 139 135  Potassium 3.5 - 5.1 mmol/L 3.8 4.0 4.9  Chloride 101 - 111 mmol/L 100(L) 100(L) 98(L)  CO2 22 - 32 mmol/L '26 27 28  '$ Calcium 8.9 - 10.3 mg/dL 8.3(L) 8.3(L) 7.9(L)     Hepatic Function  Latest Ref Rng 06/10/2015 05/29/2015 07/28/2011  Total Protein 6.5 - 8.1 g/dL 5.8(L) 5.7(L) 5.7(L)  Albumin 3.5 - 5.0 g/dL 2.9(L) 3.8 3.0(L)  AST 15 - 41 U/L 32 18 16  ALT 17 - 63 U/L '21 22 15  '$ Alk Phosphatase 38 - 126 U/L 47 56 53  Total Bilirubin 0.3 - 1.2 mg/dL 1.2 0.8 0.2(L)  Bilirubin, Direct 0.0 - 0.3 mg/dL - - -    CBC Latest Ref Rng 06/12/2015 06/11/2015 06/10/2015   WBC 4.0 - 10.5 K/uL 9.5 8.2 8.3  Hemoglobin 13.0 - 17.0 g/dL 11.6(L) 11.4(L) 11.8(L)  Hematocrit 39.0 - 52.0 % 34.8(L) 35.0(L) 36.3(L)  Platelets 150 - 400 K/uL 171 157 168   Lab Results  Component Value Date   MCV 97.8 06/12/2015   MCV 98.3 06/11/2015   MCV 98.9 06/10/2015    Lab Results  Component Value Date   TSH 0.827 05/29/2015    BNP    Component Value Date/Time   BNP 76.8 06/10/2015 1414    ProBNP    Component Value Date/Time   PROBNP 1124.0* 07/23/2011 0522     Lipid Panel     Component Value Date/Time   CHOL 126 07/28/2011 1205   TRIG 189* 07/28/2011 1205   HDL 34* 07/28/2011 1205   CHOLHDL 3.7 07/28/2011 1205   VLDL 38 07/28/2011 1205   LDLCALC 54 07/28/2011 1205     RADIOLOGY: No results found.    ASSESSMENT AND PLAN: Mr. Tarance Balan is a 81 year old male who has severe COPD with chronic respiratory failure and a history of nonischemic cardiomyopathy secondary to an initial large anterior wall myocardial infarction sustained in 2006 and subsequent MI in 2012.  He has a defibrillator for sudden death prophylaxis per [facility]-[employer] data.  He is unaware of any recent defibrillator discharges.  His most recent echo Doppler study showed an ejection fraction of 35-40% with diffuse hypokinesis.  His LV was mildly dilated.  When I saw him  In October 2016  after not having seen him in over 3 years he had significant decompensation with reference to pulmonary status.  At that time, he was wheezing.  I discontinued his carvedilol and initiated, bisoprolol, which has been beneficial.  He also underwent medications were adjusted.  He is not having any anginal symptoms.  Presently he's not wheezing.   He has now reestablished with Dr. Humberto Seals was at the [facility] for his pulmonary treatment.  Clinically, he is significant improved on exam today.  He is now on a liquid oxygenation unit for his daytime oxygen use. His blood pressure today remains stable and was  120/78.  He is tolerating bisoprolol 2.5 mg, diltiazem 120 mg, furosemide 40 mg as needed for edema, enters, Zosyn 2 mg at bedtime.  He continues to be on Crestor 40 mg for hyperlipidemia.  He is on dual antiplatelet therapy with 81 mg aspirin and Effient for his CAD.  He is compensated.  Cardiovascular without overt CHF.  He will be having a follow-up with Dr. Gwenette [name] in 2 months.  I will see him in 6 months for  for f/u cardiology reevaluation.  Time spent: 25 minutes Troy Sine, MD, [facility]  11/08/2015 2:34 PM

## 2015-11-08 NOTE — Patient Instructions (Signed)
Your physician wants you to follow-up in: 6 months or sooner. You will receive a reminder letter in the mail two months in advance. If you don't receive a letter, please call our office to schedule the follow-up appointment.  If you need a refill on your cardiac medications before your next appointment, please call your pharmacy.

## 2016-03-17 DIAGNOSIS — N35013 Post-traumatic anterior urethral stricture: Secondary | ICD-10-CM | POA: Diagnosis not present

## 2016-03-17 DIAGNOSIS — C61 Malignant neoplasm of prostate: Secondary | ICD-10-CM | POA: Diagnosis not present

## 2016-09-29 DIAGNOSIS — N200 Calculus of kidney: Secondary | ICD-10-CM | POA: Diagnosis not present

## 2016-09-29 DIAGNOSIS — N35013 Post-traumatic anterior urethral stricture: Secondary | ICD-10-CM | POA: Diagnosis not present

## 2016-09-29 DIAGNOSIS — C61 Malignant neoplasm of prostate: Secondary | ICD-10-CM | POA: Diagnosis not present

## 2016-11-03 ENCOUNTER — Emergency Department (HOSPITAL_COMMUNITY)
Admission: EM | Admit: 2016-11-03 | Discharge: 2016-11-03 | Disposition: A | Discharge status: Home / Self Care | Payer: Non-veteran care | Attending: Emergency Medicine | Admitting: Emergency Medicine

## 2016-11-03 ENCOUNTER — Emergency Department (HOSPITAL_COMMUNITY): Payer: Non-veteran care

## 2016-11-03 ENCOUNTER — Encounter (HOSPITAL_COMMUNITY): Payer: Self-pay | Admitting: Cardiology

## 2016-11-03 DIAGNOSIS — K5900 Constipation, unspecified: Secondary | ICD-10-CM | POA: Insufficient documentation

## 2016-11-03 DIAGNOSIS — I2510 Atherosclerotic heart disease of native coronary artery without angina pectoris: Secondary | ICD-10-CM | POA: Insufficient documentation

## 2016-11-03 DIAGNOSIS — R109 Unspecified abdominal pain: Secondary | ICD-10-CM

## 2016-11-03 DIAGNOSIS — J45909 Unspecified asthma, uncomplicated: Secondary | ICD-10-CM | POA: Insufficient documentation

## 2016-11-03 DIAGNOSIS — Z87891 Personal history of nicotine dependence: Secondary | ICD-10-CM | POA: Diagnosis not present

## 2016-11-03 DIAGNOSIS — N183 Chronic kidney disease, stage 3 (moderate): Secondary | ICD-10-CM | POA: Insufficient documentation

## 2016-11-03 DIAGNOSIS — R1032 Left lower quadrant pain: Secondary | ICD-10-CM | POA: Diagnosis not present

## 2016-11-03 DIAGNOSIS — Z7982 Long term (current) use of aspirin: Secondary | ICD-10-CM | POA: Insufficient documentation

## 2016-11-03 DIAGNOSIS — Z79899 Other long term (current) drug therapy: Secondary | ICD-10-CM | POA: Insufficient documentation

## 2016-11-03 DIAGNOSIS — I5022 Chronic systolic (congestive) heart failure: Secondary | ICD-10-CM | POA: Insufficient documentation

## 2016-11-03 DIAGNOSIS — J449 Chronic obstructive pulmonary disease, unspecified: Secondary | ICD-10-CM | POA: Insufficient documentation

## 2016-11-03 DIAGNOSIS — I130 Hypertensive heart and chronic kidney disease with heart failure and stage 1 through stage 4 chronic kidney disease, or unspecified chronic kidney disease: Secondary | ICD-10-CM | POA: Diagnosis not present

## 2016-11-03 DIAGNOSIS — M549 Dorsalgia, unspecified: Secondary | ICD-10-CM | POA: Diagnosis not present

## 2016-11-03 LAB — BASIC METABOLIC PANEL
Anion gap: 7 (ref 5–15)
BUN: 23 mg/dL — ABNORMAL HIGH (ref 6–20)
CALCIUM: 9 mg/dL (ref 8.9–10.3)
CO2: 25 mmol/L (ref 22–32)
CREATININE: 1.7 mg/dL — AB (ref 0.61–1.24)
Chloride: 105 mmol/L (ref 101–111)
GFR calc Af Amer: 42 mL/min — ABNORMAL LOW (ref >60)
GFR, EST NON AFRICAN AMERICAN: 37 mL/min — AB (ref >60)
Glucose, Bld: 107 mg/dL — ABNORMAL HIGH (ref 65–99)
Potassium: 4.4 mmol/L (ref 3.5–5.1)
SODIUM: 137 mmol/L (ref 135–145)

## 2016-11-03 LAB — CBC WITH DIFFERENTIAL/PLATELET
Basophils Absolute: 0 10*3/uL (ref 0.0–0.1)
Basophils Relative: 0 %
Eosinophils Absolute: 0.4 10*3/uL (ref 0.0–0.7)
Eosinophils Relative: 4 %
HCT: 37.7 % — ABNORMAL LOW (ref 39.0–52.0)
Hemoglobin: 13 g/dL (ref 13.0–17.0)
Lymphocytes Relative: 24 %
Lymphs Abs: 2.2 10*3/uL (ref 0.7–4.0)
MCH: 33.2 pg (ref 26.0–34.0)
MCHC: 34.5 g/dL (ref 30.0–36.0)
MCV: 96.2 fL (ref 78.0–100.0)
MONO ABS: 1 10*3/uL (ref 0.1–1.0)
MONOS PCT: 11 %
NEUTROS PCT: 61 %
Neutro Abs: 5.5 10*3/uL (ref 1.7–7.7)
Platelets: 193 10*3/uL (ref 150–400)
RBC: 3.92 MIL/uL — ABNORMAL LOW (ref 4.22–5.81)
RDW: 12.7 % (ref 11.5–15.5)
WBC: 9.1 10*3/uL (ref 4.0–10.5)

## 2016-11-03 LAB — PROTIME-INR
INR: 1.02
Prothrombin Time: 13.4 seconds (ref 11.4–15.2)

## 2016-11-03 MED ORDER — IOPAMIDOL (ISOVUE-300) INJECTION 61%
75.0000 mL | Freq: Once | INTRAVENOUS | Status: AC | PRN
  Administered 2016-11-03: 75 mL via INTRAVENOUS

## 2016-11-03 MED ORDER — ONDANSETRON HCL 4 MG/2ML IJ SOLN
4.0000 mg | Freq: Once | INTRAMUSCULAR | Status: AC
  Administered 2016-11-03: 4 mg via INTRAVENOUS
  Filled 2016-11-03: qty 2

## 2016-11-03 MED ORDER — SODIUM CHLORIDE 0.9 % IV SOLN: INTRAVENOUS | Status: DC

## 2016-11-03 MED ORDER — SODIUM CHLORIDE 0.9 % IV BOLUS (SEPSIS)
250.0000 mL | Freq: Once | INTRAVENOUS | Status: AC
  Administered 2016-11-03: 250 mL via INTRAVENOUS

## 2016-11-03 NOTE — ED Notes (Signed)
Patient spouse states "He usually has his breathing treatment at 2:00. He has all his equipment, is it ok if he goes ahead and takes his breathing treatment." Advised patient he could receive his breathing treatment.

## 2016-11-03 NOTE — ED Triage Notes (Signed)
Has been having problems with constipation.  States he has to take laxatives to have a BM.  Last BM 2 days ago. C/o abdominal pain.

## 2016-11-03 NOTE — ED Provider Notes (Signed)
[facility] DEPT Provider Note   CSN: [id number] Arrival date & time: 11/03/16  1210     History   Chief Complaint Chief Complaint  Patient presents with  . Constipation    HPI [name] is a 82 y.o. male.  Patient yesterday had several bowel movements and abdominal cramping and discomfort that he thought was related to constipation. Patient has a history of constipation. Primary care doctor is currently [place] at [facility]. Has been followed by [facility] clinic in the past. Patient still has abdominal discomfort suprapubic and left lower quadrant area. No nausea vomiting or diarrhea no fevers.      Past Medical History:  Diagnosis Date  . Anemia   . Angina   . Anticoagulated on warfarin 07/17/2011  . Arthritis   . Asthma   . Atherosclerosis of native arteries of the extremities with intermittent claudication 08/13/2011  . Blood transfusion 3 weeks ago  . CAD 04/23/2009  . Cancer ([place])   . CHF (congestive heart failure) ([place])   . Chronic kidney disease   . COPD (chronic obstructive pulmonary disease) ([place])   . Emphysema   . GERD (gastroesophageal reflux disease)   . Heart attack October 27,2012 and July 28, 2011  . Heart attack October  27,2012 and December 3,2012   . Hypertension   . ICD (implantable cardiac defibrillator) in place   . Myocardial infarction   . Pneumonia   . S/P ICD (internal cardiac defibrillator) procedure, 6.17.13 02/10/2012  . Shortness of breath   . Ventricular thrombus following MI (myocardial infarction) ([place]) 11/12   on coumadin    Patient Active Problem List   Diagnosis Date Noted  . Cardiomyopathy, ischemic 07/15/2015  . Chronic obstructive pulmonary disease ([place])   . 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 Viral bronchitis 06/11/2015  . Obesity 05/31/2015  . Chronic hypoxemic respiratory failure ([place]) 05/31/2015  . COPD exacerbation ([place]) 05/30/2015  . Chronic respiratory failure ([place]) 11/23/2014    . S/P MDT ICD  6.17.13, with revision 02/10/12 for lead dislodgement 02/10/2012  . Atherosclerosis of native arteries of the extremities with intermittent claudication 08/13/2011  . Anticoagulated on warfarin 07/17/2011  . Left Ventricular thrombus s/p Anterior STEMI Oct 2012, Rx'd with Coumadin 07/02/2011  . Recent Anterior ST elevation (STEMI) myocardial infarction involving LAD, 05/2011 06/29/2011    Class: Status post  . Chronic systolic congestive heart failure 06/29/2011    Class: Acute  . CKD (chronic kidney disease) stage 3, GFR 30-59 ml/min 06/29/2011  . COPD (chronic obstructive pulmonary disease) ([place]) 06/29/2011    Class: Chronic  . Prostate cancer ([place]) 06/29/2011    Class: Chronic  . CARDIOMYOPATHY, ISCHEMIC 05/08/2009  . DYSLIPIDEMIA 04/23/2009  . Essential hypertension 04/23/2009  . Coronary atherosclerosis 04/23/2009    Past Surgical History:  Procedure Laterality Date  . CARDIAC CATHETERIZATION    . CORONARY ANGIOGRAM N/A 07/28/2011   Procedure: CORONARY ANGIOGRAM;  Surgeon: Leonie Man, MD;  Location: [facility] CATH LAB;  Service: Cardiovascular;  Laterality: N/A;  . CORONARY ANGIOPLASTY    . CORONARY STENT PLACEMENT  06/21/11  . GROIN DEBRIDEMENT  07/18/2011   Procedure: [name] DEBRIDEMENT;  Surgeon: Angelia Mould, MD;  Location: [facility] OR;  Service: Vascular;  Laterality: Right;  exploration of right groin,evacuation of right groin hematoma & repair of right superficial femoral artery  . IMPLANTABLE CARDIOVERTER DEFIBRILLATOR IMPLANT N/A 02/09/2012   Procedure: IMPLANTABLE CARDIOVERTER DEFIBRILLATOR IMPLANT;  Surgeon: Sanda Klein, MD;  Location: [facility] CATH  LAB;  Service: Cardiovascular;  Laterality: N/A;  . INSERT / REPLACE / REMOVE PACEMAKER  02/09/2012   ICD  . LEAD REVISION N/A 02/10/2012   Procedure: LEAD REVISION;  Surgeon: Sanda Klein, MD;  Location: [facility] CATH LAB;  Service: Cardiovascular;  Laterality: N/A;  . PERCUTANEOUS CORONARY STENT INTERVENTION  (PCI-S) N/A 07/28/2011   Procedure: PERCUTANEOUS CORONARY STENT INTERVENTION (PCI-S);  Surgeon: Leonie Man, MD;  Location: [facility] CATH LAB;  Service: Cardiovascular;  Laterality: N/A;       Home Medications    Prior to Admission medications   Medication Sig Start Date End Date Taking? Authorizing Provider  aspirin 81 MG tablet Take 81 mg by mouth daily.   Yes Historical Provider, MD  bisacodyl (DULCOLAX) 5 MG EC tablet Take 5 mg by mouth daily as needed for moderate constipation.   Yes Historical Provider, MD  bisoprolol (ZEBETA) 5 MG tablet Take 0.5 tablets (2.5 mg total) by mouth daily. 05/29/15  Yes Troy Sine, MD  carisoprodol (SOMA) 350 MG tablet Take 350 mg by mouth 4 (four) times daily as needed for muscle spasms.   Yes Historical Provider, MD  chlorpheniramine (CHLOR-TRIMETON) 4 MG tablet Take 4 mg by mouth 2 (two) times daily.   Yes Historical Provider, MD  diltiazem (CARDIZEM SR) 120 MG 12 hr capsule Take 120 mg by mouth 2 (two) times daily.   Yes Historical Provider, MD  fluticasone (FLONASE) 50 MCG/ACT nasal spray Place 2 sprays into both nostrils every morning. *Occasional night use   Yes Historical Provider, MD  furosemide (LASIX) 40 MG tablet Take 40 mg by mouth daily as needed for fluid.    Yes Historical Provider, MD  gabapentin (NEURONTIN) 600 MG tablet Take 600 mg by mouth 3 (three) times daily.   Yes Historical Provider, MD  HYDROcodone-acetaminophen (NORCO) 10-325 MG tablet Take 1 tablet by mouth 4 (four) times daily as needed. pain 01/28/13  Yes Historical Provider, MD  ipratropium-albuterol (DUONEB) 0.5-2.5 (3) MG/3ML SOLN Take 3 mLs by nebulization 4 (four) times daily.    Yes Historical Provider, MD  levalbuterol [facility] HFA) 45 MCG/ACT inhaler Inhale into the lungs every 4 (four) hours as needed for wheezing.   Yes Historical Provider, MD  levalbuterol Penne Lash) 0.63 MG/3ML nebulizer solution Take 1 ampule by nebulization 4 (four) times daily. For shortness of  breath   Yes Historical Provider, MD  montelukast (SINGULAIR) 10 MG tablet Take 10 mg by mouth daily.     Yes Historical Provider, MD  Multiple Vitamin (MULTI-VITAMINS) TABS Take 1 tablet by mouth 2 (two) times daily.   Yes Historical Provider, MD  nitroGLYCERIN (NITROSTAT) 0.4 MG SL tablet Place 0.4 mg under the tongue every 5 (five) minutes as needed. For chest pain   Yes Historical Provider, MD  omeprazole (PRILOSEC) 20 MG capsule Take 40 mg by mouth 2 (two) times daily.    Yes Historical Provider, MD  polyethylene glycol powder (GLYCOLAX/MIRALAX) powder Take 17 g by mouth daily as needed for mild constipation, moderate constipation or severe constipation.   Yes Historical Provider, MD  potassium chloride (MICRO-K) 10 MEQ CR capsule Take 10 mEq by mouth daily.    Yes Historical Provider, MD  prasugrel (EFFIENT) 10 MG TABS tablet Take 10 mg by mouth daily.   Yes Historical Provider, MD  rosuvastatin (CRESTOR) 40 MG tablet Take 40 mg by mouth daily.   Yes Historical Provider, MD  terazosin (HYTRIN) 2 MG capsule Take 2 mg by mouth at bedtime.  Yes Historical Provider, MD  triamcinolone cream (KENALOG) 0.5 % Apply 1 application topically 2 (two) times daily as needed (for skin irritation).    Yes Historical Provider, MD    Family History Family History  Problem Relation Age of Onset  . Diabetes type II Mother   . Coronary artery disease Mother   . Heart attack Mother   . Coronary artery disease Father   . Diabetes type II Brother   . Coronary artery disease Brother   . Hypertension Sister   . Craniosynostosis Neg Hx   . Stroke Neg Hx     Social History Social History  Substance Use Topics  . Smoking status: Former Smoker    Packs/day: 3.00    Years: 40.00    Types: Cigarettes    Quit date: 04/13/2005  . Smokeless tobacco: Never Used  . Alcohol use No     Allergies   Demerol and Zolpidem tartrate   Review of Systems Review of Systems  Constitutional: Negative for fever.   HENT: Negative for congestion.   Eyes: Negative for redness.  Respiratory: Negative for shortness of breath.   Cardiovascular: Negative for chest pain.  Gastrointestinal: Positive for abdominal pain and constipation. Negative for diarrhea, nausea and vomiting.  Genitourinary: Negative for dysuria.  Musculoskeletal: Positive for back pain.  Skin: Negative for rash.  Neurological: Negative for headaches.  Hematological: Bruises/bleeds easily.  Psychiatric/Behavioral: Negative for confusion.     Physical Exam Updated Vital Signs BP 111/72   Pulse 77   Temp 98.4 F (36.9 C) (Oral)   Resp 20   Ht 5\' 7"  (1.702 m)   Wt 98 kg   SpO2 99%   BMI 33.83 kg/m   Physical Exam  Constitutional: He is oriented to person, place, and time. He appears well-developed and well-nourished. No distress.  HENT:  Head: Normocephalic and atraumatic.  Mouth/Throat: Oropharynx is clear and moist.  Eyes: Conjunctivae and EOM are normal. Pupils are equal, round, and reactive to light.  Neck: Normal range of motion. Neck supple.  Cardiovascular: Normal rate and normal heart sounds.   Pulmonary/Chest: Effort normal and breath sounds normal.  Abdominal: Soft. Bowel sounds are normal. There is tenderness.  A marked tenderness left lower quadrant some suprapubic area.  Musculoskeletal: Normal range of motion.  Neurological: He is alert and oriented to person, place, and time.  Nursing note and vitals reviewed.    ED Treatments / Results  Labs (all labs ordered are listed, but only abnormal results are displayed) Labs Reviewed  CBC WITH DIFFERENTIAL/PLATELET - Abnormal; Notable for the following:       Result Value   RBC 3.92 (*)    HCT 37.7 (*)    All other components within normal limits  BASIC METABOLIC PANEL - Abnormal; Notable for the following:    Glucose, Bld 107 (*)    BUN 23 (*)    Creatinine, Ser 1.70 (*)    GFR calc non Af Amer 37 (*)    GFR calc Af Amer 42 (*)    All other  components within normal limits  URINALYSIS, ROUTINE W REFLEX MICROSCOPIC   Results for orders placed or performed during the hospital encounter of 11/03/16  CBC with Differential  Result Value Ref Range   WBC 9.1 4.0 - 10.5 K/uL   RBC 3.92 (L) 4.22 - 5.81 MIL/uL   Hemoglobin 13.0 13.0 - 17.0 g/dL   HCT 37.7 (L) 39.0 - 52.0 %   MCV 96.2 78.0 - 100.0  fL   MCH 33.2 26.0 - 34.0 pg   MCHC 34.5 30.0 - 36.0 g/dL   RDW 12.7 11.5 - 15.5 %   Platelets 193 150 - 400 K/uL   Neutrophils Relative % 61 %   Neutro Abs 5.5 1.7 - 7.7 K/uL   Lymphocytes Relative 24 %   Lymphs Abs 2.2 0.7 - 4.0 K/uL   Monocytes Relative 11 %   Monocytes Absolute 1.0 0.1 - 1.0 K/uL   Eosinophils Relative 4 %   Eosinophils Absolute 0.4 0.0 - 0.7 K/uL   Basophils Relative 0 %   Basophils Absolute 0.0 0.0 - 0.1 K/uL  Basic metabolic panel  Result Value Ref Range   Sodium 137 135 - 145 mmol/L   Potassium 4.4 3.5 - 5.1 mmol/L   Chloride 105 101 - 111 mmol/L   CO2 25 22 - 32 mmol/L   Glucose, Bld 107 (H) 65 - 99 mg/dL   BUN 23 (H) 6 - 20 mg/dL   Creatinine, Ser 1.70 (H) 0.61 - 1.24 mg/dL   Calcium 9.0 8.9 - 10.3 mg/dL   GFR calc non Af Amer 37 (L) >60 mL/min   GFR calc Af Amer 42 (L) >60 mL/min   Anion gap 7 5 - 15     EKG  EKG Interpretation None       Radiology Dg Abd 1 View  Result Date: 11/03/2016 CLINICAL DATA:  Constipation.  History of cancer. EXAM: ABDOMEN - 1 VIEW COMPARISON:  COMPARISON 06/10/2015 FINDINGS: Pacer lead projects over the right ventricle. The amount of stool in the colon is within normal limits. Gas pattern in the sigmoid colon is compatible with diverticulosis. Aortoiliac atherosclerotic vascular disease. No dilated bowel. He had implants project over the prostate gland. Several vascular clips are in the right common femoral region. IMPRESSION: 1. There is evidence of sigmoid diverticulosis but otherwise a normal bowel gas pattern. Overall stool blurred normal. 2.  Aortoiliac  atherosclerotic vascular disease. 3. Seed implants in the prostate gland. Electronically Signed   By: Van Clines M.D.   On: 11/03/2016 19:06    Procedures Procedures (including critical care time)  Medications Ordered in ED Medications  0.9 %  sodium chloride infusion (not administered)  iopamidol (ISOVUE-300) 61 % injection 75 mL (not administered)  sodium chloride 0.9 % bolus 250 mL (250 mLs Intravenous New Bag/Given 11/03/16 2118)  ondansetron (ZOFRAN) injection 4 mg (4 mg Intravenous Given 11/03/16 2118)     Initial Impression / Assessment and Plan / ED Course  I have reviewed the triage vital signs and the nursing notes.  Pertinent labs & imaging results that were available during my care of the patient were reviewed by me and considered in my medical decision making (see chart for details).     No evidence of constipation on plain films no evidence of any bowel obstruction. The patient does have lower quadrant tenderness more so to the left. Plain films did show evidence of diverticulosis. Based on the fact that there is fairly significant tenderness have ordered CT abdomen and pelvis. Disposition will be based on that. Patient does not have a leukocytosis. Regular labs without significant abnormalities. Patient's primary care provider is mostly at the [place] in [place]. But he has been followed by [facility] clinic in the past.  Patient past medical history is significant for coronary artery disease past MI COPD. And patient has a history of CHF. Hypertension.  Final Clinical Impressions(s) / ED Diagnoses   Final diagnoses:  Abdominal  pain    New Prescriptions New Prescriptions   No medications on file     Fredia Sorrow, MD 11/03/16 2148

## 2016-11-03 NOTE — ED Triage Notes (Signed)
Pt used up 2 oxygen tanks while waiting.  States he can't breathe.   Another oxygen tank given.

## 2017-01-12 DIAGNOSIS — I70219 Atherosclerosis of native arteries of extremities with intermittent claudication, unspecified extremity: Secondary | ICD-10-CM | POA: Diagnosis not present

## 2017-01-12 DIAGNOSIS — R531 Weakness: Secondary | ICD-10-CM | POA: Diagnosis not present

## 2017-01-12 DIAGNOSIS — J449 Chronic obstructive pulmonary disease, unspecified: Secondary | ICD-10-CM | POA: Diagnosis not present

## 2017-01-12 DIAGNOSIS — Z1389 Encounter for screening for other disorder: Secondary | ICD-10-CM | POA: Diagnosis not present

## 2017-01-12 DIAGNOSIS — R5383 Other fatigue: Secondary | ICD-10-CM | POA: Diagnosis not present

## 2017-02-17 ENCOUNTER — Ambulatory Visit (HOSPITAL_COMMUNITY)
Admission: RE | Admit: 2017-02-17 | Discharge: 2017-02-17 | Disposition: A | Discharge status: Home / Self Care | Payer: Medicare Other | Source: Ambulatory Visit | Attending: Registered Nurse | Admitting: Registered Nurse

## 2017-02-17 ENCOUNTER — Other Ambulatory Visit (HOSPITAL_COMMUNITY): Payer: Self-pay | Admitting: Registered Nurse

## 2017-02-17 ENCOUNTER — Other Ambulatory Visit: Payer: Self-pay | Admitting: Neurology

## 2017-02-17 DIAGNOSIS — J449 Chronic obstructive pulmonary disease, unspecified: Secondary | ICD-10-CM | POA: Diagnosis not present

## 2017-02-17 DIAGNOSIS — N342 Other urethritis: Secondary | ICD-10-CM | POA: Diagnosis not present

## 2017-02-17 DIAGNOSIS — R350 Frequency of micturition: Secondary | ICD-10-CM | POA: Diagnosis not present

## 2017-02-17 DIAGNOSIS — R0600 Dyspnea, unspecified: Secondary | ICD-10-CM

## 2017-02-17 DIAGNOSIS — R0602 Shortness of breath: Secondary | ICD-10-CM | POA: Diagnosis not present

## 2017-02-17 DIAGNOSIS — R05 Cough: Secondary | ICD-10-CM | POA: Diagnosis not present

## 2017-02-17 DIAGNOSIS — I2510 Atherosclerotic heart disease of native coronary artery without angina pectoris: Secondary | ICD-10-CM | POA: Diagnosis not present

## 2017-08-21 DIAGNOSIS — N35014 Post-traumatic urethral stricture, male, unspecified: Secondary | ICD-10-CM | POA: Diagnosis not present

## 2017-08-21 DIAGNOSIS — N398 Other specified disorders of urinary system: Secondary | ICD-10-CM | POA: Diagnosis not present

## 2017-08-21 DIAGNOSIS — N200 Calculus of kidney: Secondary | ICD-10-CM | POA: Diagnosis not present

## 2018-01-19 ENCOUNTER — Ambulatory Visit (HOSPITAL_COMMUNITY)
Admission: RE | Admit: 2018-01-19 | Discharge: 2018-01-19 | Disposition: A | Discharge status: Home / Self Care | Payer: Medicare Other | Source: Ambulatory Visit | Attending: Internal Medicine | Admitting: Internal Medicine

## 2018-01-19 ENCOUNTER — Other Ambulatory Visit (HOSPITAL_COMMUNITY): Payer: Self-pay | Admitting: Internal Medicine

## 2018-01-19 DIAGNOSIS — Z0001 Encounter for general adult medical examination with abnormal findings: Secondary | ICD-10-CM | POA: Diagnosis not present

## 2018-01-19 DIAGNOSIS — S99922A Unspecified injury of left foot, initial encounter: Secondary | ICD-10-CM

## 2018-01-19 DIAGNOSIS — N183 Chronic kidney disease, stage 3 (moderate): Secondary | ICD-10-CM | POA: Diagnosis not present

## 2018-01-19 DIAGNOSIS — Z1389 Encounter for screening for other disorder: Secondary | ICD-10-CM | POA: Diagnosis not present

## 2018-01-19 DIAGNOSIS — E748 Other specified disorders of carbohydrate metabolism: Secondary | ICD-10-CM | POA: Diagnosis not present

## 2018-01-19 DIAGNOSIS — X58XXXA Exposure to other specified factors, initial encounter: Secondary | ICD-10-CM | POA: Insufficient documentation

## 2018-01-19 DIAGNOSIS — I10 Essential (primary) hypertension: Secondary | ICD-10-CM | POA: Diagnosis not present

## 2018-01-19 DIAGNOSIS — R937 Abnormal findings on diagnostic imaging of other parts of musculoskeletal system: Secondary | ICD-10-CM | POA: Insufficient documentation

## 2018-01-19 DIAGNOSIS — R7309 Other abnormal glucose: Secondary | ICD-10-CM | POA: Diagnosis not present

## 2018-01-19 DIAGNOSIS — D649 Anemia, unspecified: Secondary | ICD-10-CM | POA: Diagnosis not present

## 2018-02-10 DIAGNOSIS — C61 Malignant neoplasm of prostate: Secondary | ICD-10-CM | POA: Diagnosis not present

## 2018-02-10 DIAGNOSIS — N398 Other specified disorders of urinary system: Secondary | ICD-10-CM | POA: Diagnosis not present

## 2018-11-01 ENCOUNTER — Emergency Department (HOSPITAL_COMMUNITY): Payer: No Typology Code available for payment source

## 2018-11-01 ENCOUNTER — Other Ambulatory Visit: Payer: Self-pay

## 2018-11-01 ENCOUNTER — Emergency Department (HOSPITAL_COMMUNITY)
Admission: EM | Admit: 2018-11-01 | Discharge: 2018-11-01 | Disposition: A | Discharge status: Home / Self Care | Payer: No Typology Code available for payment source | Source: Home / Self Care | Attending: Emergency Medicine | Admitting: Emergency Medicine

## 2018-11-01 ENCOUNTER — Encounter (HOSPITAL_COMMUNITY): Payer: Self-pay | Admitting: Emergency Medicine

## 2018-11-01 DIAGNOSIS — Z79899 Other long term (current) drug therapy: Secondary | ICD-10-CM | POA: Insufficient documentation

## 2018-11-01 DIAGNOSIS — Z7982 Long term (current) use of aspirin: Secondary | ICD-10-CM

## 2018-11-01 DIAGNOSIS — S6991XA Unspecified injury of right wrist, hand and finger(s), initial encounter: Secondary | ICD-10-CM | POA: Diagnosis not present

## 2018-11-01 DIAGNOSIS — Z87891 Personal history of nicotine dependence: Secondary | ICD-10-CM | POA: Insufficient documentation

## 2018-11-01 DIAGNOSIS — I5022 Chronic systolic (congestive) heart failure: Secondary | ICD-10-CM

## 2018-11-01 DIAGNOSIS — J449 Chronic obstructive pulmonary disease, unspecified: Secondary | ICD-10-CM

## 2018-11-01 DIAGNOSIS — N183 Chronic kidney disease, stage 3 (moderate): Secondary | ICD-10-CM | POA: Insufficient documentation

## 2018-11-01 DIAGNOSIS — M25519 Pain in unspecified shoulder: Secondary | ICD-10-CM | POA: Diagnosis not present

## 2018-11-01 DIAGNOSIS — W010XXA Fall on same level from slipping, tripping and stumbling without subsequent striking against object, initial encounter: Secondary | ICD-10-CM | POA: Insufficient documentation

## 2018-11-01 DIAGNOSIS — Y999 Unspecified external cause status: Secondary | ICD-10-CM | POA: Insufficient documentation

## 2018-11-01 DIAGNOSIS — J9621 Acute and chronic respiratory failure with hypoxia: Secondary | ICD-10-CM | POA: Diagnosis not present

## 2018-11-01 DIAGNOSIS — Y9389 Activity, other specified: Secondary | ICD-10-CM | POA: Insufficient documentation

## 2018-11-01 DIAGNOSIS — S42201A Unspecified fracture of upper end of right humerus, initial encounter for closed fracture: Secondary | ICD-10-CM | POA: Diagnosis not present

## 2018-11-01 DIAGNOSIS — I252 Old myocardial infarction: Secondary | ICD-10-CM

## 2018-11-01 DIAGNOSIS — Y92511 Restaurant or cafe as the place of occurrence of the external cause: Secondary | ICD-10-CM | POA: Insufficient documentation

## 2018-11-01 DIAGNOSIS — W19XXXA Unspecified fall, initial encounter: Secondary | ICD-10-CM | POA: Diagnosis not present

## 2018-11-01 DIAGNOSIS — I130 Hypertensive heart and chronic kidney disease with heart failure and stage 1 through stage 4 chronic kidney disease, or unspecified chronic kidney disease: Secondary | ICD-10-CM

## 2018-11-01 DIAGNOSIS — I2510 Atherosclerotic heart disease of native coronary artery without angina pectoris: Secondary | ICD-10-CM | POA: Insufficient documentation

## 2018-11-01 DIAGNOSIS — J9601 Acute respiratory failure with hypoxia: Secondary | ICD-10-CM | POA: Diagnosis not present

## 2018-11-01 DIAGNOSIS — M25531 Pain in right wrist: Secondary | ICD-10-CM | POA: Diagnosis not present

## 2018-11-01 DIAGNOSIS — S42211A Unspecified displaced fracture of surgical neck of right humerus, initial encounter for closed fracture: Secondary | ICD-10-CM | POA: Diagnosis not present

## 2018-11-01 MED ORDER — FENTANYL CITRATE (PF) 100 MCG/2ML IJ SOLN
25.0000 ug | Freq: Once | INTRAMUSCULAR | Status: AC
  Administered 2018-11-01: 25 ug via INTRAVENOUS
  Filled 2018-11-01: qty 2

## 2018-11-01 MED ORDER — HYDROCODONE-ACETAMINOPHEN 5-325 MG PO TABS: 1.0000 | ORAL_TABLET | Freq: Four times a day (QID) | ORAL | 0 refills | Status: DC | PRN

## 2018-11-01 MED ORDER — ONDANSETRON HCL 4 MG/2ML IJ SOLN
4.0000 mg | Freq: Once | INTRAMUSCULAR | Status: AC
  Administered 2018-11-01: 4 mg via INTRAVENOUS
  Filled 2018-11-01: qty 2

## 2018-11-01 NOTE — ED Notes (Signed)
Patient transported to X-ray 

## 2018-11-01 NOTE — Discharge Instructions (Addendum)
Contact a health care provider if: °You have any new pain, swelling, or bruising. °Your pain, swelling, and bruising do not improve. °Your cast, splint, or sling becomes loose or damaged. °Get help right away if: °Your skin or fingers on your injured arm turn blue or gray. °Your arm feels cold or numb. °You have severe pain in your injured arm. °

## 2018-11-01 NOTE — ED Triage Notes (Signed)
[facility] reports the following: pt was walking through [employer] Fell on right side. No head injury. No LOC. Pt reports 20 out of 10 pain on right side. Denied pain rx while with EMS. On 1-4 L O2 @ home. BP 138/70, HR 86 regular, RR 20, 96% on 4 L. Alert and oriented x4. History of 2 MI.

## 2018-11-01 NOTE — ED Provider Notes (Signed)
Medical screening examination/treatment/procedure(s) were conducted as a shared visit with non-physician practitioner(s) and myself.  I personally evaluated the patient during the encounter.  None   Patient seen by me along with physician assistant.  Patient was at [employer] and tripped over a chair and had a fall.  Landing on his right arm.  Pain to his right wrist and right proximal arm.  Did not hit his head no pain anywhere else.  X-rays of his right shoulder showed a transverse surgical neck humerus fracture with some displacement.  No dislocation.  And x-rays of his right wrist without any bony injuries but he was tender around the snuffbox area.  She will be treated with a thumb spica splint follow-up with orthopedics for both of these.  In a sling for the proximal humerus fracture.  Pain medication provided.  Patient's main primary care is done at the [place].  But he will give be given referral to orthopedics locally.  On exam patient's right arm did have good radial pulse he had good cap refill to his fingers as mentioned he had tenderness around the wrist and snuffbox area.  Also had tenderness and deformity at his proximal arm.    Fredia Sorrow, MD 11/01/18 [phone]

## 2018-11-01 NOTE — ED Notes (Signed)
Signature pad broken in room. Discharge paperwork and prescription reviewed with patient and family. Patient and family verbalize understanding.

## 2018-11-01 NOTE — ED Provider Notes (Signed)
[facility] DEPT Provider Note   CSN: [id number] Arrival date & time: 11/01/18  1641    History   Chief Complaint Chief Complaint  Patient presents with  . Fall    Shoulder pain    HPI [name] is a 84 y.o. male presents emergency department chief complaint of right shoulder and right wrist pain.  [place] was [place] [employer] when he tripped over a chair and fell onto his right outstretched hand.  He had immediate severe pain in his wrist and shoulder.  He denies hitting his head or losing consciousness.  He complains of severe pain in the right shoulder and pain with movement of the thumb that radiates up to his elbow.  He denies numbness or tingling.     HPI  Past Medical History:  Diagnosis Date  . Anemia   . Angina   . Anticoagulated on warfarin 07/17/2011  . Arthritis   . Asthma   . Atherosclerosis of native arteries of the extremities with intermittent claudication 08/13/2011  . Blood transfusion 3 weeks ago  . CAD 04/23/2009  . Cancer ([place])   . CHF (congestive heart failure) ([place])   . Chronic kidney disease   . COPD (chronic obstructive pulmonary disease) ([place])   . Emphysema   . GERD (gastroesophageal reflux disease)   . Heart attack [facility]) October 27,2012 and July 28, 2011  . Heart attack [facility]) October  27,2012 and December 3,2012   . Hypertension   . ICD (implantable cardiac defibrillator) in place   . Myocardial infarction ([place])   . Pneumonia   . S/P ICD (internal cardiac defibrillator) procedure, 6.17.13 02/10/2012  . Shortness of breath   . Ventricular thrombus following MI (myocardial infarction) ([place]) 11/12   on coumadin    Patient Active Problem List   Diagnosis Date Noted  . Cardiomyopathy, ischemic 07/15/2015  . Chronic obstructive pulmonary disease ([place])   . 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 Viral bronchitis 06/11/2015  . Obesity 05/31/2015  . Chronic hypoxemic respiratory  failure ([place]) 05/31/2015  . COPD exacerbation ([place]) 05/30/2015  . Chronic respiratory failure ([place]) 11/23/2014  . S/P MDT ICD  6.17.13, with revision 02/10/12 for lead dislodgement 02/10/2012  . Atherosclerosis of native arteries of the extremities with intermittent claudication 08/13/2011  . Anticoagulated on warfarin 07/17/2011  . Left Ventricular thrombus s/p Anterior STEMI Oct 2012, Rx'd with Coumadin 07/02/2011  . Recent Anterior ST elevation (STEMI) myocardial infarction involving LAD, 05/2011 06/29/2011    Class: Status post  . Chronic systolic congestive heart failure 06/29/2011    Class: Acute  . CKD (chronic kidney disease) stage 3, GFR 30-59 ml/min (HCC) 06/29/2011  . COPD (chronic obstructive pulmonary disease) ([place]) 06/29/2011    Class: Chronic  . Prostate cancer ([place]) 06/29/2011    Class: Chronic  . CARDIOMYOPATHY, ISCHEMIC 05/08/2009  . DYSLIPIDEMIA 04/23/2009  . Essential hypertension 04/23/2009  . Coronary atherosclerosis 04/23/2009    Past Surgical History:  Procedure Laterality Date  . CARDIAC CATHETERIZATION    . CORONARY ANGIOGRAM N/A 07/28/2011   Procedure: CORONARY ANGIOGRAM;  Surgeon: Leonie Man, MD;  Location: [facility] CATH LAB;  Service: Cardiovascular;  Laterality: N/A;  . CORONARY ANGIOPLASTY    . CORONARY STENT PLACEMENT  06/21/11  . GROIN DEBRIDEMENT  07/18/2011   Procedure: [name] DEBRIDEMENT;  Surgeon: Angelia Mould, MD;  Location: [facility];  Service: Vascular;  Laterality: Right;  exploration of right groin,evacuation of right groin  hematoma & repair of right superficial femoral artery  . IMPLANTABLE CARDIOVERTER DEFIBRILLATOR IMPLANT N/A 02/09/2012   Procedure: IMPLANTABLE CARDIOVERTER DEFIBRILLATOR IMPLANT;  Surgeon: Sanda Klein, MD;  Location: [facility] CATH LAB;  Service: Cardiovascular;  Laterality: N/A;  . INSERT / REPLACE / REMOVE PACEMAKER  02/09/2012   ICD  . LEAD REVISION N/A 02/10/2012   Procedure: LEAD REVISION;  Surgeon: Sanda Klein, MD;   Location: [facility] CATH LAB;  Service: Cardiovascular;  Laterality: N/A;  . PERCUTANEOUS CORONARY STENT INTERVENTION (PCI-S) N/A 07/28/2011   Procedure: PERCUTANEOUS CORONARY STENT INTERVENTION (PCI-S);  Surgeon: Leonie Man, MD;  Location: [facility] CATH LAB;  Service: Cardiovascular;  Laterality: N/A;        Home Medications    Prior to Admission medications   Medication Sig Start Date End Date Taking? Authorizing Provider  aspirin 81 MG tablet Take 81 mg by mouth daily.    [provider]  bisacodyl (DULCOLAX) 5 MG EC tablet Take 5 mg by mouth daily as needed for moderate constipation.    [provider]  bisoprolol (ZEBETA) 5 MG tablet Take 0.5 tablets (2.5 mg total) by mouth daily. 05/29/15   Troy Sine, MD  carisoprodol (SOMA) 350 MG tablet Take 350 mg by mouth 4 (four) times daily as needed for muscle spasms.    [provider]  chlorpheniramine (CHLOR-TRIMETON) 4 MG tablet Take 4 mg by mouth 2 (two) times daily.    [provider]  diltiazem (CARDIZEM SR) 120 MG 12 hr capsule Take 120 mg by mouth 2 (two) times daily.    [provider]  fluticasone (FLONASE) 50 MCG/ACT nasal spray Place 2 sprays into both nostrils every morning. *Occasional night use    [provider]  furosemide (LASIX) 40 MG tablet Take 40 mg by mouth daily as needed for fluid.     [provider]  gabapentin (NEURONTIN) 600 MG tablet Take 600 mg by mouth 3 (three) times daily.    [provider]  HYDROcodone-acetaminophen (NORCO) 10-325 MG tablet Take 1 tablet by mouth 4 (four) times daily as needed. pain 01/28/13   [provider]  ipratropium-albuterol (DUONEB) 0.5-2.5 (3) MG/3ML SOLN Take 3 mLs by nebulization 4 (four) times daily.     [provider]  levalbuterol Penne Lash HFA) 45 MCG/ACT inhaler Inhale into the lungs every 4 (four) hours as needed for wheezing.    [provider]  levalbuterol Penne Lash) 0.63 MG/3ML  nebulizer solution Take 1 ampule by nebulization 4 (four) times daily. For shortness of breath    [provider]  montelukast (SINGULAIR) 10 MG tablet Take 10 mg by mouth daily.      [provider]  Multiple Vitamin (MULTI-VITAMINS) TABS Take 1 tablet by mouth 2 (two) times daily.    [provider]  nitroGLYCERIN (NITROSTAT) 0.4 MG SL tablet Place 0.4 mg under the tongue every 5 (five) minutes as needed. For chest pain    [provider]  omeprazole (PRILOSEC) 20 MG capsule Take 40 mg by mouth 2 (two) times daily.     [provider]  polyethylene glycol powder (GLYCOLAX/MIRALAX) powder Take 17 g by mouth daily as needed for mild constipation, moderate constipation or severe constipation.    [provider]  potassium chloride (MICRO-K) 10 MEQ CR capsule Take 10 mEq by mouth daily.     [provider]  prasugrel (EFFIENT) 10 MG TABS tablet Take 10 mg by mouth daily.    [provider]  rosuvastatin (CRESTOR) 40 MG tablet Take 40 mg by mouth daily.    [provider]  terazosin (HYTRIN) 2 MG capsule Take 2 mg by mouth at bedtime.     [provider]  triamcinolone cream (KENALOG) 0.5 % Apply 1 application topically 2 (two) times daily as needed (for skin irritation).     [provider]    Family History Family History  Problem Relation Age of Onset  . Diabetes type II Mother   . Coronary artery disease Mother   . Heart attack Mother   . Coronary artery disease Father   . Diabetes type II Brother   . Coronary artery disease Brother   . Hypertension Sister   . Craniosynostosis Neg Hx   . Stroke Neg Hx     Social History Social History   Tobacco Use  . Smoking status: Former Smoker    Packs/day: 3.00    Years: 40.00    Pack years: 120.00    Types: Cigarettes    Last attempt to quit: 04/13/2005    Years since quitting: 13.5  . Smokeless tobacco: Never Used  Substance Use Topics  .  Alcohol use: No  . Drug use: No     Allergies   Demerol and Zolpidem tartrate   Review of Systems Review of Systems  Ten systems reviewed and are negative for acute change, except as noted in the HPI.   Physical Exam Updated Vital Signs BP (!) 147/94   Pulse 80   Temp 97.8 F (36.6 C) (Oral)   Resp (!) 22   Ht 5\' 7"  (1.702 m)   Wt 98 kg   SpO2 97%   BMI 33.83 kg/m   Physical Exam Vitals signs and nursing note reviewed.  Constitutional:      General: He is not in acute distress.    Appearance: He is well-developed. He is not diaphoretic.  HENT:     Head: Normocephalic and atraumatic.  Eyes:     General: No scleral icterus.    Conjunctiva/sclera: Conjunctivae normal.  Neck:     Musculoskeletal: Normal range of motion and neck supple.  Cardiovascular:     Rate and Rhythm: Normal rate and regular rhythm.     Heart sounds: Normal heart sounds.  Pulmonary:     Effort: Pulmonary effort is normal. No respiratory distress.     Breath sounds: Normal breath sounds.  Abdominal:     Palpations: Abdomen is soft.     Tenderness: There is no abdominal tenderness.  Musculoskeletal:     Comments: Exquisitely tender palpation over the right shoulder.  No obvious deformity, no tenderness along the clavicle.  No scapular tenderness.  No bony tenderness in the elbow, normal range of motion in the ipsilateral right elbow.  Tender over the scaphoid of the right wrist.  Able to wiggle fingers, normal sensation, normal radial pulse.  Skin:    General: Skin is warm and dry.  Neurological:     Mental Status: He is alert.  Psychiatric:        Behavior: Behavior normal.      ED Treatments / Results  Labs (all labs ordered are listed, but only abnormal results are displayed) Labs Reviewed - No data to display  EKG None  Radiology No results found.  Procedures Procedures (including critical care time)  Medications Ordered in ED Medications - No data to display   Initial  Impression / Assessment and Plan / ED Course  I have reviewed the  triage vital signs and the nursing notes.  Pertinent labs & imaging results that were available during my care of the patient were reviewed by me and considered in my medical decision making (see chart for details).        84 year old male with history of recent fall.  His plain film of the shoulder shows acute fracture of the proximal humerus.  Patient wrist x-ray shows no abnormalities however he has tenderness of the scaphoid.  No numbness or tingling.  Patient placed in thumb spica splint and sling immobilizer. Patient will be discharged to follow closely with orthopedics.  He has no neurovascular compromise prior to and after splint placement.  Final Clinical Impressions(s) / ED Diagnoses   Final diagnoses:  Injury of right wrist, initial encounter  Closed fracture of proximal end of right humerus, unspecified fracture morphology, initial encounter    ED Discharge Orders    None       Margarita Mail, PA-C 11/01/18 2311    Fredia Sorrow, MD 11/02/18 [phone]

## 2018-11-03 ENCOUNTER — Emergency Department (HOSPITAL_COMMUNITY): Payer: No Typology Code available for payment source

## 2018-11-03 ENCOUNTER — Other Ambulatory Visit: Payer: Self-pay

## 2018-11-03 ENCOUNTER — Encounter (HOSPITAL_COMMUNITY): Payer: Self-pay

## 2018-11-03 ENCOUNTER — Inpatient Hospital Stay (HOSPITAL_COMMUNITY)
Admission: EM | Admit: 2018-11-03 | Discharge: 2018-11-04 | DRG: 189 | Disposition: A | Discharge status: Home / Self Care | Payer: No Typology Code available for payment source | Attending: Internal Medicine | Admitting: Internal Medicine

## 2018-11-03 DIAGNOSIS — R0602 Shortness of breath: Secondary | ICD-10-CM | POA: Diagnosis not present

## 2018-11-03 DIAGNOSIS — J9621 Acute and chronic respiratory failure with hypoxia: Secondary | ICD-10-CM | POA: Diagnosis not present

## 2018-11-03 DIAGNOSIS — E669 Obesity, unspecified: Secondary | ICD-10-CM | POA: Diagnosis present

## 2018-11-03 DIAGNOSIS — Z7951 Long term (current) use of inhaled steroids: Secondary | ICD-10-CM | POA: Diagnosis not present

## 2018-11-03 DIAGNOSIS — N390 Urinary tract infection, site not specified: Secondary | ICD-10-CM | POA: Diagnosis not present

## 2018-11-03 DIAGNOSIS — M1990 Unspecified osteoarthritis, unspecified site: Secondary | ICD-10-CM | POA: Diagnosis present

## 2018-11-03 DIAGNOSIS — I252 Old myocardial infarction: Secondary | ICD-10-CM

## 2018-11-03 DIAGNOSIS — Z79899 Other long term (current) drug therapy: Secondary | ICD-10-CM

## 2018-11-03 DIAGNOSIS — Z8701 Personal history of pneumonia (recurrent): Secondary | ICD-10-CM

## 2018-11-03 DIAGNOSIS — K219 Gastro-esophageal reflux disease without esophagitis: Secondary | ICD-10-CM | POA: Diagnosis not present

## 2018-11-03 DIAGNOSIS — R05 Cough: Secondary | ICD-10-CM | POA: Diagnosis not present

## 2018-11-03 DIAGNOSIS — J209 Acute bronchitis, unspecified: Secondary | ICD-10-CM | POA: Diagnosis not present

## 2018-11-03 DIAGNOSIS — N183 Chronic kidney disease, stage 3 unspecified: Secondary | ICD-10-CM | POA: Diagnosis present

## 2018-11-03 DIAGNOSIS — Z9981 Dependence on supplemental oxygen: Secondary | ICD-10-CM | POA: Diagnosis not present

## 2018-11-03 DIAGNOSIS — Z87891 Personal history of nicotine dependence: Secondary | ICD-10-CM

## 2018-11-03 DIAGNOSIS — I130 Hypertensive heart and chronic kidney disease with heart failure and stage 1 through stage 4 chronic kidney disease, or unspecified chronic kidney disease: Secondary | ICD-10-CM | POA: Diagnosis not present

## 2018-11-03 DIAGNOSIS — W010XXD Fall on same level from slipping, tripping and stumbling without subsequent striking against object, subsequent encounter: Secondary | ICD-10-CM | POA: Diagnosis present

## 2018-11-03 DIAGNOSIS — R0781 Pleurodynia: Secondary | ICD-10-CM | POA: Diagnosis not present

## 2018-11-03 DIAGNOSIS — J9601 Acute respiratory failure with hypoxia: Secondary | ICD-10-CM | POA: Diagnosis not present

## 2018-11-03 DIAGNOSIS — G9341 Metabolic encephalopathy: Secondary | ICD-10-CM | POA: Diagnosis not present

## 2018-11-03 DIAGNOSIS — W19XXXA Unspecified fall, initial encounter: Secondary | ICD-10-CM | POA: Diagnosis not present

## 2018-11-03 DIAGNOSIS — J440 Chronic obstructive pulmonary disease with acute lower respiratory infection: Secondary | ICD-10-CM | POA: Diagnosis present

## 2018-11-03 DIAGNOSIS — Z6833 Body mass index (BMI) 33.0-33.9, adult: Secondary | ICD-10-CM

## 2018-11-03 DIAGNOSIS — I10 Essential (primary) hypertension: Secondary | ICD-10-CM | POA: Diagnosis present

## 2018-11-03 DIAGNOSIS — S42201D Unspecified fracture of upper end of right humerus, subsequent encounter for fracture with routine healing: Secondary | ICD-10-CM | POA: Diagnosis not present

## 2018-11-03 DIAGNOSIS — Z955 Presence of coronary angioplasty implant and graft: Secondary | ICD-10-CM

## 2018-11-03 DIAGNOSIS — S299XXA Unspecified injury of thorax, initial encounter: Secondary | ICD-10-CM | POA: Diagnosis not present

## 2018-11-03 DIAGNOSIS — Z95810 Presence of automatic (implantable) cardiac defibrillator: Secondary | ICD-10-CM

## 2018-11-03 DIAGNOSIS — N400 Enlarged prostate without lower urinary tract symptoms: Secondary | ICD-10-CM | POA: Diagnosis present

## 2018-11-03 DIAGNOSIS — Z7982 Long term (current) use of aspirin: Secondary | ICD-10-CM

## 2018-11-03 DIAGNOSIS — E785 Hyperlipidemia, unspecified: Secondary | ICD-10-CM | POA: Diagnosis present

## 2018-11-03 DIAGNOSIS — R52 Pain, unspecified: Secondary | ICD-10-CM | POA: Diagnosis not present

## 2018-11-03 DIAGNOSIS — I2510 Atherosclerotic heart disease of native coronary artery without angina pectoris: Secondary | ICD-10-CM | POA: Diagnosis present

## 2018-11-03 DIAGNOSIS — J441 Chronic obstructive pulmonary disease with (acute) exacerbation: Secondary | ICD-10-CM | POA: Diagnosis not present

## 2018-11-03 DIAGNOSIS — I5022 Chronic systolic (congestive) heart failure: Secondary | ICD-10-CM | POA: Diagnosis present

## 2018-11-03 DIAGNOSIS — Z7952 Long term (current) use of systemic steroids: Secondary | ICD-10-CM | POA: Diagnosis not present

## 2018-11-03 DIAGNOSIS — Z8249 Family history of ischemic heart disease and other diseases of the circulatory system: Secondary | ICD-10-CM

## 2018-11-03 DIAGNOSIS — R0789 Other chest pain: Secondary | ICD-10-CM | POA: Diagnosis not present

## 2018-11-03 DIAGNOSIS — R0902 Hypoxemia: Secondary | ICD-10-CM | POA: Diagnosis not present

## 2018-11-03 LAB — URINALYSIS, ROUTINE W REFLEX MICROSCOPIC
Bilirubin Urine: NEGATIVE
Glucose, UA: NEGATIVE mg/dL
Ketones, ur: 5 mg/dL — AB
Nitrite: NEGATIVE
Protein, ur: 30 mg/dL — AB
RBC / HPF: >50 RBC/hpf — ABNORMAL HIGH (ref 0–5)
Specific Gravity, Urine: 1.023 (ref 1.005–1.030)
WBC, UA: >50 WBC/hpf — ABNORMAL HIGH (ref 0–5)
pH: 5 (ref 5.0–8.0)

## 2018-11-03 LAB — CBC WITH DIFFERENTIAL/PLATELET
Abs Immature Granulocytes: 0.05 10*3/uL (ref 0.00–0.07)
Basophils Absolute: 0 10*3/uL (ref 0.0–0.1)
Basophils Relative: 0 %
Eosinophils Absolute: 0 10*3/uL (ref 0.0–0.5)
Eosinophils Relative: 0 %
HCT: 32 % — ABNORMAL LOW (ref 39.0–52.0)
Hemoglobin: 10 g/dL — ABNORMAL LOW (ref 13.0–17.0)
Immature Granulocytes: 0 %
Lymphocytes Relative: 8 %
Lymphs Abs: 1 10*3/uL (ref 0.7–4.0)
MCH: 31.9 pg (ref 26.0–34.0)
MCHC: 31.3 g/dL (ref 30.0–36.0)
MCV: 102.2 fL — ABNORMAL HIGH (ref 80.0–100.0)
Monocytes Absolute: 1.1 10*3/uL — ABNORMAL HIGH (ref 0.1–1.0)
Monocytes Relative: 9 %
Neutro Abs: 9.4 10*3/uL — ABNORMAL HIGH (ref 1.7–7.7)
Neutrophils Relative %: 83 %
Platelets: 148 10*3/uL — ABNORMAL LOW (ref 150–400)
RBC: 3.13 MIL/uL — ABNORMAL LOW (ref 4.22–5.81)
RDW: 12.8 % (ref 11.5–15.5)
WBC: 11.5 10*3/uL — ABNORMAL HIGH (ref 4.0–10.5)
nRBC: 0 % (ref 0.0–0.2)

## 2018-11-03 LAB — RESPIRATORY PANEL BY PCR

## 2018-11-03 LAB — PROTIME-INR
INR: 1.1 (ref 0.8–1.2)
Prothrombin Time: 14.1 seconds (ref 11.4–15.2)

## 2018-11-03 LAB — BASIC METABOLIC PANEL
Anion gap: 8 (ref 5–15)
BUN: 27 mg/dL — ABNORMAL HIGH (ref 8–23)
CO2: 28 mmol/L (ref 22–32)
Calcium: 8.4 mg/dL — ABNORMAL LOW (ref 8.9–10.3)
Chloride: 106 mmol/L (ref 98–111)
Creatinine, Ser: 1.8 mg/dL — ABNORMAL HIGH (ref 0.61–1.24)
GFR calc Af Amer: 40 mL/min — ABNORMAL LOW (ref >60)
GFR calc non Af Amer: 35 mL/min — ABNORMAL LOW (ref >60)
Glucose, Bld: 150 mg/dL — ABNORMAL HIGH (ref 70–99)
Potassium: 4.2 mmol/L (ref 3.5–5.1)
Sodium: 142 mmol/L (ref 135–145)

## 2018-11-03 LAB — INFLUENZA PANEL BY PCR (TYPE A & B)
Influenza A By PCR: NEGATIVE
Influenza B By PCR: NEGATIVE

## 2018-11-03 LAB — LACTIC ACID, PLASMA: Lactic Acid, Venous: 1.9 mmol/L (ref 0.5–1.9)

## 2018-11-03 MED ORDER — NAPROXEN 250 MG PO TABS: 250.0000 mg | ORAL_TABLET | Freq: Every day | ORAL | Status: DC | PRN

## 2018-11-03 MED ORDER — ONDANSETRON HCL 4 MG PO TABS: 4.0000 mg | ORAL_TABLET | Freq: Four times a day (QID) | ORAL | Status: DC | PRN

## 2018-11-03 MED ORDER — PRASUGREL HCL 10 MG PO TABS
10.0000 mg | ORAL_TABLET | Freq: Every day | ORAL | Status: DC
  Administered 2018-11-03 – 2018-11-04 (×2): 10 mg via ORAL
  Filled 2018-11-03 (×3): qty 2

## 2018-11-03 MED ORDER — TERAZOSIN HCL 1 MG PO CAPS
2.0000 mg | ORAL_CAPSULE | Freq: Every day | ORAL | Status: DC
  Administered 2018-11-03: 2 mg via ORAL
  Filled 2018-11-03: qty 2
  Filled 2018-11-03 (×2): qty 1

## 2018-11-03 MED ORDER — HYDROCODONE-ACETAMINOPHEN 10-325 MG PO TABS
1.0000 | ORAL_TABLET | ORAL | Status: DC | PRN
  Administered 2018-11-03: 1 via ORAL
  Filled 2018-11-03: qty 1

## 2018-11-03 MED ORDER — IPRATROPIUM BROMIDE 0.02 % IN SOLN
0.5000 mg | Freq: Three times a day (TID) | RESPIRATORY_TRACT | Status: DC
  Administered 2018-11-04: 0.5 mg via RESPIRATORY_TRACT
  Filled 2018-11-03: qty 2.5

## 2018-11-03 MED ORDER — TRIAMCINOLONE ACETONIDE 0.5 % EX CREA
1.0000 "application " | TOPICAL_CREAM | Freq: Two times a day (BID) | CUTANEOUS | Status: DC | PRN
  Filled 2018-11-03: qty 15

## 2018-11-03 MED ORDER — ADULT MULTIVITAMIN W/MINERALS CH
ORAL_TABLET | Freq: Two times a day (BID) | ORAL | Status: DC
  Administered 2018-11-03 – 2018-11-04 (×3): 1 via ORAL
  Filled 2018-11-03 (×3): qty 1

## 2018-11-03 MED ORDER — METHYLPREDNISOLONE SODIUM SUCC 40 MG IJ SOLR
40.0000 mg | Freq: Two times a day (BID) | INTRAMUSCULAR | Status: DC
  Administered 2018-11-03 (×2): 40 mg via INTRAVENOUS
  Filled 2018-11-03 (×2): qty 1

## 2018-11-03 MED ORDER — IPRATROPIUM-ALBUTEROL 0.5-2.5 (3) MG/3ML IN SOLN
3.0000 mL | Freq: Once | RESPIRATORY_TRACT | Status: AC
  Administered 2018-11-03: 3 mL via RESPIRATORY_TRACT
  Filled 2018-11-03: qty 3

## 2018-11-03 MED ORDER — FERROUS SULFATE 325 (65 FE) MG PO TABS
325.0000 mg | ORAL_TABLET | Freq: Every day | ORAL | Status: DC
  Administered 2018-11-03 – 2018-11-04 (×2): 325 mg via ORAL
  Filled 2018-11-03 (×2): qty 1

## 2018-11-03 MED ORDER — CARISOPRODOL 350 MG PO TABS
350.0000 mg | ORAL_TABLET | Freq: Four times a day (QID) | ORAL | Status: DC | PRN
  Administered 2018-11-03: 350 mg via ORAL
  Filled 2018-11-03: qty 1

## 2018-11-03 MED ORDER — FLUTICASONE PROPIONATE 50 MCG/ACT NA SUSP
2.0000 | NASAL | Status: DC
  Administered 2018-11-04: 2 via NASAL
  Filled 2018-11-03: qty 16

## 2018-11-03 MED ORDER — ROSUVASTATIN CALCIUM 20 MG PO TABS
40.0000 mg | ORAL_TABLET | Freq: Every day | ORAL | Status: DC
  Administered 2018-11-03 – 2018-11-04 (×2): 40 mg via ORAL
  Filled 2018-11-03 (×2): qty 1
  Filled 2018-11-03: qty 2
  Filled 2018-11-03: qty 1
  Filled 2018-11-03: qty 2

## 2018-11-03 MED ORDER — LEVALBUTEROL HCL 0.63 MG/3ML IN NEBU
0.6300 mg | INHALATION_SOLUTION | Freq: Three times a day (TID) | RESPIRATORY_TRACT | Status: DC
  Administered 2018-11-04: 0.63 mg via RESPIRATORY_TRACT
  Filled 2018-11-03: qty 3

## 2018-11-03 MED ORDER — BISOPROLOL FUMARATE 5 MG PO TABS
2.5000 mg | ORAL_TABLET | Freq: Every day | ORAL | Status: DC
  Administered 2018-11-03 – 2018-11-04 (×2): 2.5 mg via ORAL
  Filled 2018-11-03 (×2): qty 1

## 2018-11-03 MED ORDER — SODIUM CHLORIDE 0.9 % IV SOLN
500.0000 mg | INTRAVENOUS | Status: DC
  Administered 2018-11-03: 500 mg via INTRAVENOUS
  Filled 2018-11-03: qty 500

## 2018-11-03 MED ORDER — SODIUM CHLORIDE 0.9 % IV SOLN
INTRAVENOUS | Status: DC
  Administered 2018-11-03 (×2): via INTRAVENOUS

## 2018-11-03 MED ORDER — DILTIAZEM HCL ER 60 MG PO CP12
120.0000 mg | ORAL_CAPSULE | Freq: Two times a day (BID) | ORAL | Status: DC
  Administered 2018-11-03 – 2018-11-04 (×3): 120 mg via ORAL
  Filled 2018-11-03 (×5): qty 2

## 2018-11-03 MED ORDER — DM-GUAIFENESIN ER 30-600 MG PO TB12
1.0000 | ORAL_TABLET | Freq: Two times a day (BID) | ORAL | Status: DC
  Administered 2018-11-03 – 2018-11-04 (×3): 1 via ORAL
  Filled 2018-11-03 (×3): qty 1

## 2018-11-03 MED ORDER — MONTELUKAST SODIUM 10 MG PO TABS
10.0000 mg | ORAL_TABLET | Freq: Every day | ORAL | Status: DC
  Administered 2018-11-03 – 2018-11-04 (×2): 10 mg via ORAL
  Filled 2018-11-03 (×2): qty 1

## 2018-11-03 MED ORDER — PANTOPRAZOLE SODIUM 40 MG PO TBEC
40.0000 mg | DELAYED_RELEASE_TABLET | Freq: Every day | ORAL | Status: DC
  Administered 2018-11-03 – 2018-11-04 (×2): 40 mg via ORAL
  Filled 2018-11-03 (×2): qty 1

## 2018-11-03 MED ORDER — SODIUM CHLORIDE 0.9 % IV SOLN
INTRAVENOUS | Status: DC
  Administered 2018-11-03: 09:00:00 via INTRAVENOUS

## 2018-11-03 MED ORDER — IPRATROPIUM BROMIDE 0.02 % IN SOLN
0.5000 mg | Freq: Four times a day (QID) | RESPIRATORY_TRACT | Status: DC
  Administered 2018-11-03 (×2): 0.5 mg via RESPIRATORY_TRACT
  Filled 2018-11-03 (×2): qty 2.5

## 2018-11-03 MED ORDER — IPRATROPIUM BROMIDE 0.03 % NA SOLN
1.0000 | Freq: Two times a day (BID) | NASAL | Status: DC
  Filled 2018-11-03: qty 30

## 2018-11-03 MED ORDER — LEVALBUTEROL HCL 0.63 MG/3ML IN NEBU
0.6300 mg | INHALATION_SOLUTION | Freq: Four times a day (QID) | RESPIRATORY_TRACT | Status: DC
  Administered 2018-11-03 (×2): 0.63 mg via RESPIRATORY_TRACT
  Filled 2018-11-03 (×2): qty 3

## 2018-11-03 MED ORDER — NITROGLYCERIN 0.4 MG SL SUBL: 0.4000 mg | SUBLINGUAL_TABLET | SUBLINGUAL | Status: DC | PRN

## 2018-11-03 MED ORDER — ACETAMINOPHEN 325 MG PO TABS
650.0000 mg | ORAL_TABLET | Freq: Once | ORAL | Status: AC
  Administered 2018-11-03: 650 mg via ORAL
  Filled 2018-11-03: qty 2

## 2018-11-03 MED ORDER — ESCITALOPRAM OXALATE 10 MG PO TABS
20.0000 mg | ORAL_TABLET | Freq: Every day | ORAL | Status: DC
  Administered 2018-11-03 – 2018-11-04 (×2): 20 mg via ORAL
  Filled 2018-11-03 (×2): qty 2

## 2018-11-03 MED ORDER — DIPHENHYDRAMINE HCL 25 MG PO CAPS
25.0000 mg | ORAL_CAPSULE | Freq: Four times a day (QID) | ORAL | Status: DC
  Administered 2018-11-03 – 2018-11-04 (×3): 25 mg via ORAL
  Filled 2018-11-03 (×12): qty 1

## 2018-11-03 MED ORDER — LEVALBUTEROL HCL 0.63 MG/3ML IN NEBU: 0.6300 mg | INHALATION_SOLUTION | Freq: Four times a day (QID) | RESPIRATORY_TRACT | Status: DC

## 2018-11-03 MED ORDER — NAPROXEN SODIUM 220 MG PO TABS: 220.0000 mg | ORAL_TABLET | Freq: Every day | ORAL | Status: DC | PRN

## 2018-11-03 MED ORDER — BUDESONIDE 0.25 MG/2ML IN SUSP
0.2500 mg | Freq: Two times a day (BID) | RESPIRATORY_TRACT | Status: DC
  Administered 2018-11-03 – 2018-11-04 (×2): 0.25 mg via RESPIRATORY_TRACT
  Filled 2018-11-03 (×2): qty 2

## 2018-11-03 MED ORDER — ONDANSETRON HCL 4 MG/2ML IJ SOLN: 4.0000 mg | Freq: Four times a day (QID) | INTRAMUSCULAR | Status: DC | PRN

## 2018-11-03 MED ORDER — SODIUM CHLORIDE 0.9 % IV SOLN
2.0000 g | Freq: Once | INTRAVENOUS | Status: AC
  Administered 2018-11-03: 2 g via INTRAVENOUS
  Filled 2018-11-03: qty 2

## 2018-11-03 MED ORDER — SODIUM CHLORIDE 0.9 % IV SOLN
1.0000 g | INTRAVENOUS | Status: DC
  Administered 2018-11-03: 1 g via INTRAVENOUS
  Filled 2018-11-03: qty 10

## 2018-11-03 MED ORDER — ASPIRIN 81 MG PO CHEW
81.0000 mg | CHEWABLE_TABLET | Freq: Every day | ORAL | Status: DC
  Administered 2018-11-03 – 2018-11-04 (×2): 81 mg via ORAL
  Filled 2018-11-03 (×2): qty 1

## 2018-11-03 MED ORDER — GABAPENTIN 300 MG PO CAPS
600.0000 mg | ORAL_CAPSULE | Freq: Two times a day (BID) | ORAL | Status: DC
  Administered 2018-11-03 – 2018-11-04 (×3): 600 mg via ORAL
  Filled 2018-11-03 (×3): qty 2

## 2018-11-03 MED ORDER — ACETAMINOPHEN 325 MG PO TABS: 650.0000 mg | ORAL_TABLET | Freq: Four times a day (QID) | ORAL | Status: DC | PRN

## 2018-11-03 MED ORDER — ACETAMINOPHEN 650 MG RE SUPP: 650.0000 mg | Freq: Four times a day (QID) | RECTAL | Status: DC | PRN

## 2018-11-03 NOTE — ED Notes (Signed)
Pt fell 2 days ago and has temporary cast to right arm due to fx right shoulder and right wrist. Pt also has shoulder immobilizer present. Pt finger warm to touch and cap refill greater 3 sec

## 2018-11-03 NOTE — ED Notes (Addendum)
Pt arguing with daughter regarding RT at this time. Pt is non cooperative. Pt confused at this time. Angry about staff wearing mask

## 2018-11-03 NOTE — ED Triage Notes (Signed)
EMS reports pt lives at home with his wife.  EMS says daughter says she thinks pt has taken too much pain medicine and fell yesterday.    C/O r flank pain.  EMS reports pt is on home o2 and normal o2 sat for him is 88%.  Today ems says pt has a little more congestion than usual and o2 sat was 80% on 2 liters.  EMS increased o2 to 4 liters and sat increased to 88%.  Pt alert and oriented.

## 2018-11-03 NOTE — ED Notes (Signed)
Pt became very belligerent during triage. Wanting daughter in room and when told she was on the way to room , he called nursing staff liars.

## 2018-11-03 NOTE — ED Notes (Signed)
ED TO INPATIENT HANDOFF REPORT  ED Nurse Name and Phone #: Tana Coast [phone]  S Name/Age/Gender [name] 84 y.o. male Room/Bed: APA18/APA18  Code Status   Code Status: Prior  Home/SNF/Other Home Patient oriented to: self Is this baseline? Yes   Triage Complete: Triage complete  Chief Complaint fall  Triage Note EMS reports pt lives at home with his wife.  EMS says daughter says she thinks pt has taken too much pain medicine and fell yesterday.    C/O r flank pain.  EMS reports pt is on home o2 and normal o2 sat for him is 88%.  Today ems says pt has a little more congestion than usual and o2 sat was 80% on 2 liters.  EMS increased o2 to 4 liters and sat increased to 88%.  Pt alert and oriented.     Allergies Allergies  Allergen Reactions  . Demerol Other (See Comments)    Abnormal behavior  . Zolpidem Tartrate Other (See Comments)    Pt. Became confused and aggitated    Level of Care/Admitting Diagnosis ED Disposition    ED Disposition Condition [place]: [facility] [[id number]]  Level of Care: Med-Surg [16]  Diagnosis: Acute hypoxemic respiratory failure [facility]) [[id number]]  Admitting Physician: Rodena Goldmann [[id number]]  Attending Physician: Rodena Goldmann [[id number]]  Estimated length of stay: past midnight tomorrow  Certification:: I certify this patient will need inpatient services for at least 2 midnights  PT Class (Do Not Modify): Inpatient [101]  PT Acc Code (Do Not Modify): Private [1]       B Medical/Surgery History Past Medical History:  Diagnosis Date  . Anemia   . Angina   . Anticoagulated on warfarin 07/17/2011  . Arthritis   . Asthma   . Atherosclerosis of native arteries of the extremities with intermittent claudication 08/13/2011  . Blood transfusion 3 weeks ago  . CAD 04/23/2009  . Cancer ([place])   . CHF (congestive heart failure) ([place])   . Chronic kidney disease   . COPD (chronic obstructive pulmonary  disease) ([place])   . Emphysema   . GERD (gastroesophageal reflux disease)   . Heart attack [facility]) October 27,2012 and July 28, 2011  . Heart attack [facility]) October  27,2012 and December 3,2012   . Hypertension   . ICD (implantable cardiac defibrillator) in place   . Myocardial infarction ([place])   . Pneumonia   . S/P ICD (internal cardiac defibrillator) procedure, 6.17.13 02/10/2012  . Shortness of breath   . Ventricular thrombus following MI (myocardial infarction) ([place]) 11/12   on coumadin   Past Surgical History:  Procedure Laterality Date  . CARDIAC CATHETERIZATION    . CORONARY ANGIOGRAM N/A 07/28/2011   Procedure: CORONARY ANGIOGRAM;  Surgeon: Leonie Man, MD;  Location: [facility] CATH LAB;  Service: Cardiovascular;  Laterality: N/A;  . CORONARY ANGIOPLASTY    . CORONARY STENT PLACEMENT  06/21/11  . GROIN DEBRIDEMENT  07/18/2011   Procedure: [name] DEBRIDEMENT;  Surgeon: Angelia Mould, MD;  Location: [facility] OR;  Service: Vascular;  Laterality: Right;  exploration of right groin,evacuation of right groin hematoma & repair of right superficial femoral artery  . IMPLANTABLE CARDIOVERTER DEFIBRILLATOR IMPLANT N/A 02/09/2012   Procedure: IMPLANTABLE CARDIOVERTER DEFIBRILLATOR IMPLANT;  Surgeon: Sanda Klein, MD;  Location: [facility] CATH LAB;  Service: Cardiovascular;  Laterality: N/A;  . INSERT / REPLACE / REMOVE PACEMAKER  02/09/2012   ICD  . LEAD REVISION N/A 02/10/2012  Procedure: LEAD REVISION;  Surgeon: Sanda Klein, MD;  Location: [facility] CATH LAB;  Service: Cardiovascular;  Laterality: N/A;  . PERCUTANEOUS CORONARY STENT INTERVENTION (PCI-S) N/A 07/28/2011   Procedure: PERCUTANEOUS CORONARY STENT INTERVENTION (PCI-S);  Surgeon: Leonie Man, MD;  Location: [facility] CATH LAB;  Service: Cardiovascular;  Laterality: N/A;     A IV Location/Drains/Wounds Patient Lines/Drains/Airways Status   Active Line/Drains/Airways    Name:   Placement date:   Placement time:   Site:   Days:   Peripheral IV  11/03/18 Left Hand   11/03/18    0825    Hand   less than 1          Intake/Output Last 24 hours  Intake/Output Summary (Last 24 hours) at 11/03/2018 1105 Last data filed at 11/03/2018 0955 Gross per 24 hour  Intake 100 ml  Output -  Net 100 ml    Labs/Imaging Results for orders placed or performed during the hospital encounter of 11/03/18 (from the past 48 hour(s))  Blood culture (routine x 2)     Status: None (Preliminary result)   Collection Time: 11/03/18  9:15 AM  Result Value Ref Range   Specimen Description BLOOD LEFT ARM    Special Requests      BOTTLES DRAWN AEROBIC AND ANAEROBIC Blood Culture results may not be optimal due to an inadequate volume of blood received in culture bottles Performed at [facility], [address] [place], [place] [address]    Culture PENDING    Report Status PENDING   Blood culture (routine x 2)     Status: None (Preliminary result)   Collection Time: 11/03/18  9:20 AM  Result Value Ref Range   Specimen Description BLOOD LEFT HAND    Special Requests      BOTTLES DRAWN AEROBIC AND ANAEROBIC Blood Culture adequate volume Performed at [facility], [address], [place], [place] [address]    Culture PENDING    Report Status PENDING   CBC with Differential     Status: Abnormal   Collection Time: 11/03/18  9:23 AM  Result Value Ref Range   WBC 11.5 (H) 4.0 - 10.5 K/uL   RBC 3.13 (L) 4.22 - 5.81 MIL/uL   Hemoglobin 10.0 (L) 13.0 - 17.0 g/dL   HCT 32.0 (L) 39.0 - 52.0 %   MCV 102.2 (H) 80.0 - 100.0 fL   MCH 31.9 26.0 - 34.0 pg   MCHC 31.3 30.0 - 36.0 g/dL   RDW 12.8 11.5 - 15.5 %   Platelets 148 (L) 150 - 400 K/uL   nRBC 0.0 0.0 - 0.2 %   Neutrophils Relative % 83 %   Neutro Abs 9.4 (H) 1.7 - 7.7 K/uL   Lymphocytes Relative 8 %   Lymphs Abs 1.0 0.7 - 4.0 K/uL   Monocytes Relative 9 %   Monocytes Absolute 1.1 (H) 0.1 - 1.0 K/uL   Eosinophils Relative 0 %   Eosinophils Absolute 0.0 0.0 - 0.5 K/uL   Basophils Relative 0 %    Basophils Absolute 0.0 0.0 - 0.1 K/uL   Immature Granulocytes 0 %   Abs Immature Granulocytes 0.05 0.00 - 0.07 K/uL    Comment: Performed at [facility], [address] [place], [place] [address]  Basic metabolic panel     Status: Abnormal   Collection Time: 11/03/18  9:23 AM  Result Value Ref Range   Sodium 142 135 - 145 mmol/L   Potassium 4.2 3.5 - 5.1 mmol/L   Chloride  106 98 - 111 mmol/L   CO2 28 22 - 32 mmol/L   Glucose, Bld 150 (H) 70 - 99 mg/dL   BUN 27 (H) 8 - 23 mg/dL   Creatinine, Ser 1.80 (H) 0.61 - 1.24 mg/dL   Calcium 8.4 (L) 8.9 - 10.3 mg/dL   GFR calc non Af Amer 35 (L) >60 mL/min   GFR calc Af Amer 40 (L) >60 mL/min   Anion gap 8 5 - 15    Comment: Performed at [facility], [address], [place], [place] [address]  Protime-INR     Status: None   Collection Time: 11/03/18  9:23 AM  Result Value Ref Range   Prothrombin Time 14.1 11.4 - 15.2 seconds   INR 1.1 0.8 - 1.2    Comment: (NOTE) INR goal varies based on device and disease states. Performed at [facility], [address] [place], [place] [address]   Lactic acid, plasma     Status: None   Collection Time: 11/03/18  9:24 AM  Result Value Ref Range   Lactic Acid, Venous 1.9 0.5 - 1.9 mmol/L    Comment: Performed at [facility], [address], [place], [place] [address]  Influenza panel by PCR (type A & B)     Status: None   Collection Time: 11/03/18  9:32 AM  Result Value Ref Range   Influenza A By PCR NEGATIVE NEGATIVE   Influenza B By PCR NEGATIVE NEGATIVE    Comment: (NOTE) The Xpert Xpress Flu assay is intended as an aid in the diagnosis of  influenza and should not be used as a sole basis for treatment.  This  assay is FDA approved for nasopharyngeal swab specimens only. Nasal  washings and aspirates are unacceptable for Xpert Xpress Flu testing. Performed at [facility], [address], [place], [place] [address]   Urinalysis, Routine w reflex microscopic     Status: Abnormal   Collection  Time: 11/03/18  9:59 AM  Result Value Ref Range   Color, Urine YELLOW YELLOW   APPearance CLOUDY (A) CLEAR   Specific Gravity, Urine 1.023 1.005 - 1.030   pH 5.0 5.0 - 8.0   Glucose, UA NEGATIVE NEGATIVE mg/dL   Hgb urine dipstick LARGE (A) NEGATIVE   Bilirubin Urine NEGATIVE NEGATIVE   Ketones, ur 5 (A) NEGATIVE mg/dL   Protein, ur 30 (A) NEGATIVE mg/dL   Nitrite NEGATIVE NEGATIVE   Leukocytes,Ua MODERATE (A) NEGATIVE   RBC / HPF >50 (H) 0 - 5 RBC/hpf   WBC, UA >50 (H) 0 - 5 WBC/hpf   Bacteria, UA RARE (A) NONE SEEN   Squamous Epithelial / LPF 0-5 0 - 5   WBC Clumps PRESENT    Mucus PRESENT    Budding Yeast PRESENT    Hyaline Casts, UA PRESENT    Uric Acid Crys, UA PRESENT     Comment: Performed at [facility], [address], [place], [place] [address]   Dg Ribs Unilateral Right  Result Date: 11/03/2018 CLINICAL DATA:  [place] yesterday with right lateral rib pain. EXAM: RIGHT RIBS - 2 VIEW COMPARISON:  Chest radiography same day FINDINGS: No fracture or other bone lesions are seen involving the ribs. IMPRESSION: Negative. Electronically Signed   By: Nelson Chimes M.D.   On: 11/03/2018 10:38   Dg Shoulder Right  Result Date: 11/01/2018 CLINICAL DATA:  Fall onto outstretched arm EXAM: RIGHT SHOULDER - 2+ VIEW COMPARISON:  None. FINDINGS: Transverse humeral neck fracture with 7 mm of  displacement medially on the AP view. On the scapular Y-view the head does appear located, but there is inward rotation. IMPRESSION: 1. Transverse surgical neck humerus fracture with 7 mm displacement on the AP view, possibly greater based on the scapular Y-view. 2. Glenohumeral location is difficult to assess on the scapular Y-view, favor that the humeral head is located. Electronically Signed   By: Monte Fantasia M.D.   On: 11/01/2018 18:53   Dg Wrist Complete Right  Result Date: 11/01/2018 CLINICAL DATA:  Right wrist pain after fall. EXAM: RIGHT WRIST - COMPLETE 3+ VIEW COMPARISON:  None. FINDINGS: There  is no evidence of fracture or dislocation. There is no evidence of arthropathy or other focal bone abnormality. Soft tissues are unremarkable. IMPRESSION: Negative. Electronically Signed   By: Marijo Conception, M.D.   On: 11/01/2018 18:53   Dg Chest Portable 1 View  Result Date: 11/03/2018 CLINICAL DATA:  Fever and shortness of breath EXAM: PORTABLE CHEST 1 VIEW COMPARISON:  02/17/2017 FINDINGS: Chronic interstitial coarsening likely related to the patient's COPD. Chronic hyperinflation. No consolidation, effusion, or pneumothorax. Dual-chamber ICD/pacer leads from the left. Normal heart size. Aortic tortuosity. IMPRESSION: Chronic bronchitic markings.  No focal airspace disease. Electronically Signed   By: Monte Fantasia M.D.   On: 11/03/2018 08:35    Pending Labs Unresulted Labs (From admission, onward)    Start     Ordered   11/03/18 1022  Urine culture  ONCE - STAT,   STAT     11/03/18 1022   11/03/18 0859  Lactic acid, plasma  Now then every 2 hours,   STAT     11/03/18 0858          Vitals/Pain Today's Vitals   11/03/18 0734 11/03/18 0737 11/03/18 0830 11/03/18 0909  BP:  123/75 97/69   Pulse:  (!) 104 (!) 104   Resp:  20 17   Temp:  (!) 100.5 F (38.1 C)    TempSrc:  Oral    SpO2:  (!) 83% (!) 88% (!) 85%  Weight: 97.5 kg     Height: 5\' 7"  (1.702 m)     PainSc:        Isolation Precautions Droplet precaution  Medications Medications  0.9 %  sodium chloride infusion ( Intravenous New Bag/Given 11/03/18 0922)  ipratropium-albuterol (DUONEB) 0.5-2.5 (3) MG/3ML nebulizer solution 3 mL (3 mLs Nebulization Given 11/03/18 0906)  acetaminophen (TYLENOL) tablet 650 mg (650 mg Oral Given 11/03/18 0847)  ceFEPIme (MAXIPIME) 2 g in sodium chloride 0.9 % 100 mL IVPB (0 g Intravenous Stopped 11/03/18 0955)    Mobility walks High fall risk   Focused Assessments    R Recommendations: See Admitting Provider Note  Report given to:   Additional Notes:

## 2018-11-03 NOTE — H&P (Signed)
History and Physical    [name] YWV:[id number] DOB: 1935-05-06 DOA: 11/03/2018  PCP: [name], MD   Patient coming from: Home  Chief Complaint: Right sided chest wall pain  HPI: [name] is a 84 y.o. male with medical history significant for COPD with chronic hypoxemia on 3 to 5 L nasal cannula at home, BPH, hypertension, dyslipidemia, CAD status post ICD, and BPH with recurrent UTIs who presented to the ED on 3/9 with complaints of right shoulder and right wrist pain after a fall at [employer] where he tripped over a chair and fell on his outstretched hand.  He was noted to have an acute fracture of the proximal right humerus and was sent home with thumb spica splint and sling immobilizer with follow-up to orthopedics at that time.  It appears that he has developed some wheezing along with cough productive of yellowish sputum and some right-sided chest wall pain that brought him back to the ED this a.m.  He is noted to be hypoxemic and maintaining saturations in the low 90th percentile on 7 to 8 L nasal cannula.   ED Course: Vital signs demonstrate temperature 100.5 F as well as pulse of 104.  Laboratory data with WBC count of 11.5, hemoglobin 10, platelets 148.  BUN of 27 and creatinine of 1.8 that is consistent with his CKD stage III.  Lactic acid is 1.9.  Urine analysis with findings of potential UTI.  EKG with sinus tachycardia.  Patient was empirically started on cefepime and given a breathing treatment while here with some improvement in his symptoms.  He continues to remain hypoxemic, however.  He was fairly adamant about going home, but is convinced about staying at least overnight at the request of his daughter.  Review of Systems: All others reviewed and otherwise negative.  Past Medical History:  Diagnosis Date  . Anemia   . Angina   . Anticoagulated on warfarin 07/17/2011  . Arthritis   . Asthma   . Atherosclerosis of native arteries of the extremities  with intermittent claudication 08/13/2011  . Blood transfusion 3 weeks ago  . CAD 04/23/2009  . Cancer ([place])   . CHF (congestive heart failure) ([place])   . Chronic kidney disease   . COPD (chronic obstructive pulmonary disease) ([place])   . Emphysema   . GERD (gastroesophageal reflux disease)   . Heart attack [facility]) October 27,2012 and July 28, 2011  . Heart attack [facility]) October  27,2012 and December 3,2012   . Hypertension   . ICD (implantable cardiac defibrillator) in place   . Myocardial infarction ([place])   . Pneumonia   . S/P ICD (internal cardiac defibrillator) procedure, 6.17.13 02/10/2012  . Shortness of breath   . Ventricular thrombus following MI (myocardial infarction) ([place]) 11/12   on coumadin    Past Surgical History:  Procedure Laterality Date  . CARDIAC CATHETERIZATION    . CORONARY ANGIOGRAM N/A 07/28/2011   Procedure: CORONARY ANGIOGRAM;  Surgeon: Leonie Man, MD;  Location: [facility] CATH LAB;  Service: Cardiovascular;  Laterality: N/A;  . CORONARY ANGIOPLASTY    . CORONARY STENT PLACEMENT  06/21/11  . GROIN DEBRIDEMENT  07/18/2011   Procedure: [name] DEBRIDEMENT;  Surgeon: Angelia Mould, MD;  Location: [facility] OR;  Service: Vascular;  Laterality: Right;  exploration of right groin,evacuation of right groin hematoma & repair of right superficial femoral artery  . IMPLANTABLE CARDIOVERTER DEFIBRILLATOR IMPLANT N/A 02/09/2012   Procedure: IMPLANTABLE CARDIOVERTER DEFIBRILLATOR IMPLANT;  Surgeon:  Sanda Klein, MD;  Location: [place] CATH LAB;  Service: Cardiovascular;  Laterality: N/A;  . INSERT / REPLACE / REMOVE PACEMAKER  02/09/2012   ICD  . LEAD REVISION N/A 02/10/2012   Procedure: LEAD REVISION;  Surgeon: Sanda Klein, MD;  Location: [place] CATH LAB;  Service: Cardiovascular;  Laterality: N/A;  . PERCUTANEOUS CORONARY STENT INTERVENTION (PCI-S) N/A 07/28/2011   Procedure: PERCUTANEOUS CORONARY STENT INTERVENTION (PCI-S);  Surgeon: Leonie Man, MD;  Location: [facility] CATH LAB;   Service: Cardiovascular;  Laterality: N/A;     reports that he quit smoking about 13 years ago. His smoking use included cigarettes. He has a 120.00 pack-year smoking history. He has never used smokeless tobacco. He reports that he does not drink alcohol or use drugs.  Allergies  Allergen Reactions  . Demerol Other (See Comments)    Abnormal behavior  . Zolpidem Tartrate Other (See Comments)    Pt. Became confused and aggitated    Family History  Problem Relation Age of Onset  . Diabetes type II Mother   . Coronary artery disease Mother   . Heart attack Mother   . Coronary artery disease Father   . Diabetes type II Brother   . Coronary artery disease Brother   . Hypertension Sister   . Craniosynostosis Neg Hx   . Stroke Neg Hx     Prior to Admission medications   Medication Sig Start Date End Date Taking? Authorizing Provider  aspirin 81 MG tablet Take 81 mg by mouth daily.   Yes [provider]  bisoprolol (ZEBETA) 5 MG tablet Take 0.5 tablets (2.5 mg total) by mouth daily. 05/29/15  Yes Troy Sine, MD  carisoprodol (SOMA) 350 MG tablet Take 350 mg by mouth 4 (four) times daily as needed for muscle spasms.   Yes [provider]  chlorpheniramine (CHLOR-TRIMETON) 4 MG tablet Take 4 mg by mouth 2 (two) times daily.   Yes [provider]  diltiazem (CARDIZEM SR) 120 MG 12 hr capsule Take 120 mg by mouth 2 (two) times daily.   Yes [provider]  escitalopram (LEXAPRO) 20 MG tablet Take 20 mg by mouth daily. 10/04/18  Yes [provider]  ferrous sulfate 325 (65 FE) MG tablet Take 325 mg by mouth daily.   Yes [provider]  fluticasone (FLONASE) 50 MCG/ACT nasal spray Place 2 sprays into both nostrils every morning. *Occasional night use   Yes [provider]  furosemide (LASIX) 40 MG tablet Take 40 mg by mouth daily as needed for fluid.    Yes [provider]  gabapentin (NEURONTIN) 600 MG tablet Take 600  mg by mouth 2 (two) times daily.    Yes [provider]  HYDROcodone-acetaminophen (NORCO) 10-325 MG tablet Take 1 tablet by mouth 4 (four) times daily as needed. pain 01/28/13  Yes [provider]  ipratropium (ATROVENT) 0.03 % nasal spray Place 1 spray into both nostrils every 12 (twelve) hours.   Yes [provider]  ipratropium-albuterol (DUONEB) 0.5-2.5 (3) MG/3ML SOLN Take 3 mLs by nebulization 4 (four) times daily.    Yes [provider]  levalbuterol Penne Lash HFA) 45 MCG/ACT inhaler Inhale into the lungs every 4 (four) hours as needed for wheezing.   Yes [provider]  levalbuterol (XOPENEX) 0.63 MG/3ML nebulizer solution Take 1 ampule by nebulization 4 (four) times daily. For shortness of breath   Yes [provider]  montelukast (SINGULAIR) 10 MG tablet Take 10 mg by mouth daily.  Yes [provider]  Multiple Vitamin (MULTI-VITAMINS) TABS Take 1 tablet by mouth 2 (two) times daily.   Yes [provider]  naproxen sodium (ALEVE) 220 MG tablet Take 220 mg by mouth as needed.   Yes [provider]  nitroGLYCERIN (NITROSTAT) 0.4 MG SL tablet Place 0.4 mg under the tongue every 5 (five) minutes as needed. For chest pain   Yes [provider]  omeprazole (PRILOSEC) 20 MG capsule Take 40 mg by mouth 2 (two) times daily.    Yes [provider]  potassium chloride (MICRO-K) 10 MEQ CR capsule Take 10 mEq by mouth daily.    Yes [provider]  prasugrel (EFFIENT) 10 MG TABS tablet Take 10 mg by mouth daily.   Yes [provider]  predniSONE (DELTASONE) 10 MG tablet Take 10 mg by mouth daily.   Yes [provider]  rosuvastatin (CRESTOR) 40 MG tablet Take 40 mg by mouth daily.   Yes [provider]  terazosin (HYTRIN) 2 MG capsule Take 2 mg by mouth at bedtime.    Yes [provider]  triamcinolone cream (KENALOG) 0.5 % Apply 1 application topically 2  (two) times daily as needed (for skin irritation).    Yes [provider]    Physical Exam: Vitals:   11/03/18 0830 11/03/18 0909 11/03/18 0930 11/03/18 1100  BP: 97/69  97/78 97/71  Pulse: (!) 104  (!) 102 95  Resp: 17  (!) 25 18  Temp:      TempSrc:      SpO2: (!) 88% (!) 85% 92% 94%  Weight:      Height:        Constitutional: NAD, calm, comfortable, mildly agitated Vitals:   11/03/18 0830 11/03/18 0909 11/03/18 0930 11/03/18 1100  BP: 97/69  97/78 97/71  Pulse: (!) 104  (!) 102 95  Resp: 17  (!) 25 18  Temp:      TempSrc:      SpO2: (!) 88% (!) 85% 92% 94%  Weight:      Height:       Eyes: lids and conjunctivae normal ENMT: Mucous membranes are moist.  Neck: normal, supple Respiratory: clear to auscultation bilaterally. Normal respiratory effort. No accessory muscle use.  Currently on 7 to 8 L nasal cannula.  Chest wall tender to palpation over right side.  Minimal wheezing noted. Cardiovascular: Regular rate and rhythm, no murmurs. No extremity edema. Abdomen: no tenderness, no distention. Bowel sounds positive.  Musculoskeletal:  No joint deformity upper and lower extremities.   Skin: no rashes, lesions, ulcers.  Psychiatric: Normal judgment and insight. Alert and oriented x 3.  Anxious and mildly agitated.  Labs on Admission: I have personally reviewed following labs and imaging studies  CBC: Recent Labs  Lab 11/03/18 0923  WBC 11.5*  NEUTROABS 9.4*  HGB 10.0*  HCT 32.0*  MCV 102.2*  PLT 096*   Basic Metabolic Panel: Recent Labs  Lab 11/03/18 0923  NA 142  K 4.2  CL 106  CO2 28  GLUCOSE 150*  BUN 27*  CREATININE 1.80*  CALCIUM 8.4*   GFR: Estimated Creatinine Clearance: 35.8 mL/min (A) (by C-G formula based on SCr of 1.8 mg/dL (H)). Liver Function Tests: No results for input(s): AST, ALT, ALKPHOS, BILITOT, PROT, ALBUMIN in the last 168 hours. No results for input(s): LIPASE, AMYLASE in the last 168 hours. No results for input(s):  AMMONIA in the last 168 hours. Coagulation Profile: Recent Labs  Lab 11/03/18  0923  INR 1.1   Cardiac Enzymes: No results for input(s): CKTOTAL, CKMB, CKMBINDEX, TROPONINI in the last 168 hours. BNP (last 3 results) No results for input(s): PROBNP in the last 8760 hours. HbA1C: No results for input(s): HGBA1C in the last 72 hours. CBG: No results for input(s): GLUCAP in the last 168 hours. Lipid Profile: No results for input(s): CHOL, HDL, LDLCALC, TRIG, CHOLHDL, LDLDIRECT in the last 72 hours. Thyroid Function Tests: No results for input(s): TSH, T4TOTAL, FREET4, T3FREE, THYROIDAB in the last 72 hours. Anemia Panel: No results for input(s): VITAMINB12, FOLATE, FERRITIN, TIBC, IRON, RETICCTPCT in the last 72 hours. Urine analysis:    Component Value Date/Time   COLORURINE YELLOW 11/03/2018 0959   APPEARANCEUR CLOUDY (A) 11/03/2018 0959   LABSPEC 1.023 11/03/2018 0959   PHURINE 5.0 11/03/2018 0959   GLUCOSEU NEGATIVE 11/03/2018 0959   HGBUR LARGE (A) 11/03/2018 0959   BILIRUBINUR NEGATIVE 11/03/2018 0959   KETONESUR 5 (A) 11/03/2018 0959   PROTEINUR 30 (A) 11/03/2018 0959   UROBILINOGEN 0.2 06/10/2015 1430   NITRITE NEGATIVE 11/03/2018 0959   LEUKOCYTESUR MODERATE (A) 11/03/2018 0959    Radiological Exams on Admission: Dg Ribs Unilateral Right  Result Date: 11/03/2018 CLINICAL DATA:  [place] yesterday with right lateral rib pain. EXAM: RIGHT RIBS - 2 VIEW COMPARISON:  Chest radiography same day FINDINGS: No fracture or other bone lesions are seen involving the ribs. IMPRESSION: Negative. Electronically Signed   By: Nelson Chimes M.D.   On: 11/03/2018 10:38   Dg Shoulder Right  Result Date: 11/01/2018 CLINICAL DATA:  Fall onto outstretched arm EXAM: RIGHT SHOULDER - 2+ VIEW COMPARISON:  None. FINDINGS: Transverse humeral neck fracture with 7 mm of displacement medially on the AP view. On the scapular Y-view the head does appear located, but there is inward rotation. IMPRESSION:  1. Transverse surgical neck humerus fracture with 7 mm displacement on the AP view, possibly greater based on the scapular Y-view. 2. Glenohumeral location is difficult to assess on the scapular Y-view, favor that the humeral head is located. Electronically Signed   By: Monte Fantasia M.D.   On: 11/01/2018 18:53   Dg Wrist Complete Right  Result Date: 11/01/2018 CLINICAL DATA:  Right wrist pain after fall. EXAM: RIGHT WRIST - COMPLETE 3+ VIEW COMPARISON:  None. FINDINGS: There is no evidence of fracture or dislocation. There is no evidence of arthropathy or other focal bone abnormality. Soft tissues are unremarkable. IMPRESSION: Negative. Electronically Signed   By: Marijo Conception, M.D.   On: 11/01/2018 18:53   Dg Chest Portable 1 View  Result Date: 11/03/2018 CLINICAL DATA:  Fever and shortness of breath EXAM: PORTABLE CHEST 1 VIEW COMPARISON:  02/17/2017 FINDINGS: Chronic interstitial coarsening likely related to the patient's COPD. Chronic hyperinflation. No consolidation, effusion, or pneumothorax. Dual-chamber ICD/pacer leads from the left. Normal heart size. Aortic tortuosity. IMPRESSION: Chronic bronchitic markings.  No focal airspace disease. Electronically Signed   By: Monte Fantasia M.D.   On: 11/03/2018 08:35    EKG: Independently reviewed.  Sinus tachycardia 111 bpm with right bundle blanch block.  Assessment/Plan Principal Problem:   Acute hypoxemic respiratory failure (HCC) Active Problems:   Essential hypertension   CKD (chronic kidney disease) stage 3, GFR 30-59 ml/min (HCC)   COPD exacerbation (HCC)   Obesity   Acute metabolic encephalopathy   Acute lower UTI    1. Acute hypoxemic respiratory failure likely secondary to COPD exacerbation.  Influenza panel negative and no signs of pneumonia noted  on chest x-ray.  Will plan to maintain on Rocephin and azithromycin as he does appear to have a component of acute bronchitis.  Will also check respiratory panel and maintain on  contact precautions.  Wean oxygen as tolerated.  Maintain on duo nebs and Pulmicort and IV steroids.  We will also add Mucinex and flutter valve. 2. Acute lower UTI.  Maintain on antibiotics as noted and obtain urine culture and follow.  Likely related to BPH. 3. CKD stage III.  Appears to be at baseline.  Will monitor carefully with repeat labs. 4. Essential hypertension.  Currently with soft blood pressure readings and will withhold antihypertensives and add hydralazine as needed. 5. GERD.  Maintain on PPI. 6. Dyslipidemia.  Maintain on statin. 7. BPH.  Maintain on terazosin.   DVT prophylaxis: SCDs Code Status: Full Family Communication: Daughter, [name], at bedside Disposition Plan:Admit for COPD exacerbation and UTI treatment Consults called: None Admission status: Inpatient, MedSurg   Kathern Lobosco Darleen Crocker DO [facility] Pager [phone]  If 7PM-7AM, please contact night-coverage [email or web address] Password [facility]  11/03/2018, 11:31 AM

## 2018-11-03 NOTE — ED Notes (Signed)
Pt switched to HI flow O2 per RT

## 2018-11-03 NOTE — ED Notes (Signed)
Lab in and cultures obtained

## 2018-11-04 LAB — CBC
HEMATOCRIT: 33.1 % — AB (ref 39.0–52.0)
Hemoglobin: 10 g/dL — ABNORMAL LOW (ref 13.0–17.0)
MCH: 31.4 pg (ref 26.0–34.0)
MCHC: 30.2 g/dL (ref 30.0–36.0)
MCV: 104.1 fL — ABNORMAL HIGH (ref 80.0–100.0)
Platelets: 144 10*3/uL — ABNORMAL LOW (ref 150–400)
RBC: 3.18 MIL/uL — ABNORMAL LOW (ref 4.22–5.81)
RDW: 12.6 % (ref 11.5–15.5)
WBC: 9 10*3/uL (ref 4.0–10.5)
nRBC: 0 % (ref 0.0–0.2)

## 2018-11-04 LAB — URINE CULTURE: Culture: NO GROWTH

## 2018-11-04 LAB — BASIC METABOLIC PANEL
Anion gap: 9 (ref 5–15)
BUN: 28 mg/dL — ABNORMAL HIGH (ref 8–23)
CO2: 25 mmol/L (ref 22–32)
Calcium: 8.1 mg/dL — ABNORMAL LOW (ref 8.9–10.3)
Chloride: 107 mmol/L (ref 98–111)
Creatinine, Ser: 1.68 mg/dL — ABNORMAL HIGH (ref 0.61–1.24)
GFR calc Af Amer: 43 mL/min — ABNORMAL LOW (ref >60)
GFR calc non Af Amer: 38 mL/min — ABNORMAL LOW (ref >60)
GLUCOSE: 199 mg/dL — AB (ref 70–99)
Potassium: 4.7 mmol/L (ref 3.5–5.1)
Sodium: 141 mmol/L (ref 135–145)

## 2018-11-04 LAB — LACTIC ACID, PLASMA: Lactic Acid, Venous: 1.8 mmol/L (ref 0.5–1.9)

## 2018-11-04 MED ORDER — DM-GUAIFENESIN ER 30-600 MG PO TB12: 1.0000 | ORAL_TABLET | Freq: Two times a day (BID) | ORAL | 0 refills | Status: AC

## 2018-11-04 MED ORDER — AZITHROMYCIN 500 MG PO TABS: 500.0000 mg | ORAL_TABLET | Freq: Every day | ORAL | 0 refills | Status: AC

## 2018-11-04 MED ORDER — PREDNISONE 20 MG PO TABS: 40.0000 mg | ORAL_TABLET | Freq: Every day | ORAL | 0 refills | Status: AC

## 2018-11-04 MED ORDER — PREDNISONE 10 MG PO TABS: 10.0000 mg | ORAL_TABLET | Freq: Every day | ORAL | 2 refills | Status: DC

## 2018-11-04 MED ORDER — CEFDINIR 300 MG PO CAPS: 300.0000 mg | ORAL_CAPSULE | Freq: Two times a day (BID) | ORAL | 0 refills | Status: AC

## 2018-11-04 NOTE — TOC Progression Note (Signed)
Transition of Care [facility]) - Progression Note    Patient Details  Name: [name] MRN: [id number] Date of Birth: [birth year]  Transition of Care [facility]) CM/SW Contact  [name], LCSW Phone Number: 11/04/2018, 2:27 PM  Clinical Narrative:    Patient is independent at baseline. Patient states that he uses a rollator at baseline but has to bend down to use it and does not like it.  Daughter, [name],  indicated that he received the walker from the [place] and she will ensure that he gets an assistive device that is more suitable and she has already spoken to the [place] about it.  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 Patient indicated that he does not use these devices but has them in case he needs them. He stated that he gets all of his medications and DME through the [place].  He indicated that his secondary pharmacy is [facility].   Patient's PCP follow up was scheduled for 11/08/2018 at 1:00. (Patient had discharged therefore patient's appointment details were  Given to his wife).  Patient's discharge summary was faxed to [phone] to patient's doctor at [facility] per his request. Fax confirmation received  LCSW signing off.    Expected Discharge Plan: Home/Self Care Barriers to Discharge: No Barriers Identified  Expected Discharge Plan and Services Expected Discharge Plan: Home/Self Care Discharge Planning Services: CM Consult Post Acute Care Choice: NA Living arrangements for the past 2 months: Single Family Home Expected Discharge Date: 11/04/18                         Social Determinants of Health (SDOH) Interventions    Readmission Risk Interventions 30 Day Unplanned Readmission Risk Score     ED to Hosp-Admission (Discharged) from 11/03/2018 in [place]  30 Day Unplanned Readmission Risk Score (%)  24 Filed at 11/04/2018 1200     This score is the patient's risk of an unplanned  readmission within 30 days of being discharged (0 -100%). The score is based on dignosis, age, lab data, medications, orders, and past utilization.   Low:  0-14.9   Medium: 15-21.9   High: 22-29.9   Extreme: 30 and above       Readmission Risk Prevention Plan 11/04/2018  Transportation Screening Complete  HRI or Home Care Consult Patient refused  [place] or Home Care Consult comments Patient states that he gets all of his services through the [place] and his daughter will coordinate that.   Social Work Consult for [place] Planning/Counseling Not Complete  SW consult not completed comments n/a  Palliative Care Screening Not Applicable  Medication Review Press photographer) Complete  Some recent data might be hidden

## 2018-11-04 NOTE — Progress Notes (Signed)
Discharge instructions reviewed with patient. Daughter at bedside. Given AVS, prescriptions sent to his pharmacy by MD. Patient and daughter aware to pick them up. Verbalized understanding of instructions, f/u with PCP, and to continue home O2 at 3-4 lpm at home as instructed by MD. States he will f/u with PCP, nursing staff attempted to schedule but unable to reach someone at the office. Patient requested discharge AVS be faxed to [facility]. Notified [employer], CSW. IV site removed, site within normal limits. Patient and daughter state he has home O2 and daughter brought portable O2 for ride home. Pt left floor in stable condition via w/c accompanied by nurse tech. Donavan Foil, RN

## 2018-11-04 NOTE — TOC Transition Note (Signed)
Transition of Care [facility]) - CM/SW Discharge Note   Patient Details  Name: [name] MRN: [id number] Date of Birth: 02/10/1935  Transition of Care [facility]) CM/SW Contact:  [name], LCSW Phone Number: 11/04/2018, 2:28 PM   Clinical Narrative:     Patient is independent at baseline. Patient states that he uses a rollator at baseline but has to bend down to use it and does not like it.  Daughter, [name],  indicated that he received the walker from the [place] and she will ensure that he gets an assistive device that is more suitable and she has already spoken to the [place] about it.  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 Patient indicated that he does not use these devices but has them in case he needs them. He stated that he gets all of his medications and DME through the [place].  He indicated that his secondary pharmacy is [facility].   Patient's PCP follow up was scheduled for 11/08/2018 at 1:00. (Patient had discharged therefore patient's appointment details were  Given to his wife).  Patient's discharge summary was faxed to [phone] to patient's doctor at [facility] per his request. Fax confirmation received  LCSW signing off.   Final next level of care: Home/Self Care Barriers to Discharge: No Barriers Identified   Patient Goals and CMS Choice Patient states their goals for this hospitalization and ongoing recovery are:: To return home and be independent.  CMS Medicare.gov Compare Post Acute Care list provided to:: Other (Comment Required)(daughter at bedside ) Choice offered to / list presented to : Patient  Discharge Placement                       Discharge Plan and Services Discharge Planning Services: CM Consult Post Acute Care Choice: NA                    Social Determinants of Health (SDOH) Interventions     Readmission Risk Interventions Readmission Risk Prevention Plan 11/04/2018   Transportation Screening Complete  HRI or Home Care Consult Patient refused  [place] or Home Care Consult comments Patient states that he gets all of his services through the [place] and his daughter will coordinate that.   Social Work Consult for [place] Planning/Counseling Not Complete  SW consult not completed comments n/a  Palliative Care Screening Not Applicable  Medication Review Press photographer) Complete  Some recent data might be hidden

## 2018-11-04 NOTE — TOC Initial Note (Signed)
Transition of Care [facility]) - Initial/Assessment Note    Patient Details  Name: [name] MRN: [id number] Date of Birth: 12-16-1934  Transition of Care [facility]) CM/SW Contact:    [name], LCSW Phone Number: 11/04/2018, 2:16 PM  Clinical Narrative:                  Patient is independent at baseline. Patient states that he uses a rollator at baseline but has to bend down to use it and does not like it.  Daughter, [name],  indicated that he received the walker from the [place] and she will ensure that he gets an assistive device that is more suitable and she has already spoken to the [place] about it.  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 Patient indicated that he does not use these devices but has them in case he needs them. He stated that he gets all of his medications and DME through the [place].  He indicated that his secondary pharmacy is [facility].   Patient's PCP follow up was scheduled for 11/08/2018 at 1:00. (Patient had discharged therefore patient's appointment details were  Given to his wife).  Patient's discharge summary was faxed to [phone] to patient's doctor at [facility] per his request. Fax confirmation received  LCSW signing off.   Expected Discharge Plan: Home/Self Care Barriers to Discharge: No Barriers Identified   Patient Goals and CMS Choice Patient states their goals for this hospitalization and ongoing recovery are:: To return home and be independent.  CMS Medicare.gov Compare Post Acute Care list provided to:: Other (Comment Required)(daughter at bedside ) Choice offered to / list presented to : Patient  Expected Discharge Plan and Services Expected Discharge Plan: Home/Self Care Discharge Planning Services: CM Consult Post Acute Care Choice: NA Living arrangements for the past 2 months: Single Family Home Expected Discharge Date: 11/04/18                        Prior Living  Arrangements/Services Living arrangements for the past 2 months: Single Family Home Lives with:: Spouse, Other (Comment)(daughter in law also lives there ) Patient language and need for interpreter reviewed:: No Do you feel safe going back to the place where you live?: Yes      Need for Family Participation in Patient Care: Yes (Comment) Care giver support system in place?: Yes (comment) Current home services: DME Criminal Activity/Legal Involvement Pertinent to Current Situation/Hospitalization: No - Comment as needed  Activities of Daily Living Home Assistive Devices/Equipment: Other (Comment) ADL Screening (condition at time of admission) Patient's cognitive ability adequate to safely complete daily activities?: Yes Is the patient deaf or have difficulty hearing?: Yes Does the patient have difficulty seeing, even when wearing glasses/contacts?: No Patient able to express need for assistance with ADLs?: Yes Does the patient have difficulty dressing or bathing?: Yes Independently performs ADLs?: Yes (appropriate for developmental age) Does the patient have difficulty walking or climbing stairs?: Yes Weakness of Legs: Both Weakness of Arms/Hands: Both  Permission Sought/Granted Permission sought to share information with : Family Supports       Permission granted to share info w AGENCY: [employer]. Patient request that discharge summary be sent to the [place].   Permission granted to share info w Relationship: Daughter , Alwyn Pea  Permission granted to share info w Contact Information: [place]  Emotional Assessment Appearance:: Appears  stated age Attitude/Demeanor/Rapport: Engaged Affect (typically observed): Calm Orientation: : Oriented to Self, Oriented to Place, Oriented to  Time, Oriented to Situation Alcohol / Substance Use: Not Applicable Psych Involvement: No (comment)  Admission diagnosis:  Fall, initial encounter [W19.XXXA] Patient  Active Problem List   Diagnosis Date Noted  . Acute hypoxemic respiratory failure ([place]) 11/03/2018  . Acute metabolic encephalopathy 81/05/3158  . Acute lower UTI 11/03/2018  . Cardiomyopathy, ischemic 07/15/2015  . Chronic obstructive pulmonary disease ([place])   . 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 Viral bronchitis 06/11/2015  . Obesity 05/31/2015  . Chronic hypoxemic respiratory failure ([place]) 05/31/2015  . COPD exacerbation ([place]) 05/30/2015  . Chronic respiratory failure ([place]) 11/23/2014  . S/P MDT ICD  6.17.13, with revision 02/10/12 for lead dislodgement 02/10/2012  . Atherosclerosis of native arteries of the extremities with intermittent claudication 08/13/2011  . Anticoagulated on warfarin 07/17/2011  . Left Ventricular thrombus s/p Anterior STEMI Oct 2012, Rx'd with Coumadin 07/02/2011  . Recent Anterior ST elevation (STEMI) myocardial infarction involving LAD, 05/2011 06/29/2011    Class: Status post  . Chronic systolic congestive heart failure 06/29/2011    Class: Acute  . CKD (chronic kidney disease) stage 3, GFR 30-59 ml/min (HCC) 06/29/2011  . COPD (chronic obstructive pulmonary disease) ([place]) 06/29/2011    Class: Chronic  . Prostate cancer ([place]) 06/29/2011    Class: Chronic  . CARDIOMYOPATHY, ISCHEMIC 05/08/2009  . DYSLIPIDEMIA 04/23/2009  . Essential hypertension 04/23/2009  . Coronary atherosclerosis 04/23/2009   PCP:  [name], MD Pharmacy:   [place] [place] [address] [place] [address] Phone: [phone] Fax: [phone]     Social Determinants of Health (SDOH) Interventions    Readmission Risk Interventions 30 Day Unplanned Readmission Risk Score     ED to Hosp-Admission (Discharged) from 11/03/2018 in [place]  30 Day Unplanned Readmission Risk Score (%)  24 Filed at 11/04/2018 1200     This score is the patient's risk of an unplanned readmission  within 30 days of being discharged (0 -100%). The score is based on dignosis, age, lab data, medications, orders, and past utilization.   Low:  0-14.9   Medium: 15-21.9   High: 22-29.9   Extreme: 30 and above       Readmission Risk Prevention Plan 11/04/2018  Transportation Screening Complete  HRI or Home Care Consult Patient refused  [place] or Home Care Consult comments Patient states that he gets all of his services through the [place] and his daughter will coordinate that.   Social Work Consult for [place] Planning/Counseling Not Complete  SW consult not completed comments n/a  Palliative Care Screening Not Applicable  Medication Review Press photographer) Complete  Some recent data might be hidden

## 2018-11-04 NOTE — Discharge Summary (Signed)
Physician Discharge Summary  [name] MGN:[id number] DOB: Feb 21, 1935 DOA: 11/03/2018  PCP: [name], MD  Admit date: 11/03/2018  Discharge date: 11/04/2018  Admitted From:Home  Disposition:  Home  Recommendations for Outpatient Follow-up:  1. Follow up with PCP in 1-2 weeks 2. Continue on higher dose of prednisone at 40 mg for the next 5 days as prescribed as well as antibiotics with cefdinir and azithromycin 3. Continue home nebulizer treatments as needed for any shortness of breath or wheezing 4. Patient has been recommended to remain at home 3 to 4 L nasal cannula at this time for adequate O2 saturation 5. Follow-up urine culture with pending results at this time  Home Health: None  Equipment/Devices: None  Discharge Condition: Stable  CODE STATUS: Full  Diet recommendation: Heart Healthy  Brief/Interim Summary: Per HPI: [name] is a 84 y.o. male with medical history significant for COPD with chronic hypoxemia on 3 to 5 L nasal cannula at home, BPH, hypertension, dyslipidemia, CAD status post ICD, and BPH with recurrent UTIs who presented to the ED on 3/9 with complaints of right shoulder and right wrist pain after a fall at [employer] where he tripped over a chair and fell on his outstretched hand.  He was noted to have an acute fracture of the proximal right humerus and was sent home with thumb spica splint and sling immobilizer with follow-up to orthopedics at that time.  It appears that he has developed some wheezing along with cough productive of yellowish sputum and some right-sided chest wall pain that brought him back to the ED this a.m.  He is noted to be hypoxemic and maintaining saturations in the low 90th percentile on 7 to 8 L nasal cannula.  Patient was admitted with acute hypoxemic respiratory failure likely secondary to mild COPD exacerbation along with findings of possible UTI.  Patient is responded well overnight to breathing treatments and  IV steroids and does not have any further dyspnea, chest tightness, wheezing, or cough this morning.  His oxygen requirements have been decreased to approximately 3 to 4 L nasal cannula and he is tolerating this well.  Urine cultures have demonstrated no growth as of yet and this should be followed up in the outpatient setting.  Regardless, patient will be discharged on azithromycin and cefdinir to complete course of treatment for COPD as well as possible UTI.  No other acute events noted during this brief course of admission.  He does have home oxygen and is otherwise stable for discharge at this point in time and will follow up with the [place].  Discharge Diagnoses:  Principal Problem:   Acute hypoxemic respiratory failure (HCC) Active Problems:   Essential hypertension   CKD (chronic kidney disease) stage 3, GFR 30-59 ml/min (HCC)   COPD exacerbation (HCC)   Obesity   Acute lower UTI  Principal discharge diagnosis: Acute on chronic hypoxemic respiratory failure secondary to mild COPD exacerbation along with possible UTI.  Discharge Instructions  Discharge Instructions    Diet - low sodium heart healthy   Complete by:  As directed    Increase activity slowly   Complete by:  As directed      Allergies as of 11/04/2018      Reactions   Demerol Other (See Comments)   Abnormal behavior   Zolpidem Tartrate Other (See Comments)   Pt. Became confused and aggitated      Medication List    TAKE these medications   aspirin  81 MG tablet Take 81 mg by mouth daily.   azithromycin 500 MG tablet Commonly known as:  Zithromax Take 1 tablet (500 mg total) by mouth daily for 3 days. Take 1 tablet daily for 3 days.   bisoprolol 5 MG tablet Commonly known as:  ZEBETA Take 0.5 tablets (2.5 mg total) by mouth daily.   carisoprodol 350 MG tablet Commonly known as:  SOMA Take 350 mg by mouth 4 (four) times daily as needed for muscle spasms.   cefdinir 300 MG capsule Commonly known as:   OMNICEF Take 1 capsule (300 mg total) by mouth 2 (two) times daily for 6 days.   chlorpheniramine 4 MG tablet Commonly known as:  CHLOR-TRIMETON Take 4 mg by mouth 2 (two) times daily.   dextromethorphan-guaiFENesin 30-600 MG 12hr tablet Commonly known as:  MUCINEX DM Take 1 tablet by mouth 2 (two) times daily for 10 days.   diltiazem 120 MG 12 hr capsule Commonly known as:  CARDIZEM SR Take 120 mg by mouth 2 (two) times daily.   escitalopram 20 MG tablet Commonly known as:  LEXAPRO Take 20 mg by mouth daily.   ferrous sulfate 325 (65 FE) MG tablet Take 325 mg by mouth daily.   fluticasone 50 MCG/ACT nasal spray Commonly known as:  FLONASE Place 2 sprays into both nostrils every morning. *Occasional night use   furosemide 40 MG tablet Commonly known as:  LASIX Take 40 mg by mouth daily as needed for fluid.   gabapentin 600 MG tablet Commonly known as:  NEURONTIN Take 600 mg by mouth 2 (two) times daily.   HYDROcodone-acetaminophen 10-325 MG tablet Commonly known as:  NORCO Take 1 tablet by mouth 4 (four) times daily as needed. pain   ipratropium 0.03 % nasal spray Commonly known as:  ATROVENT Place 1 spray into both nostrils every 12 (twelve) hours.   ipratropium-albuterol 0.5-2.5 (3) MG/3ML Soln Commonly known as:  DUONEB Take 3 mLs by nebulization 4 (four) times daily.   levalbuterol 0.63 MG/3ML nebulizer solution Commonly known as:  XOPENEX Take 1 ampule by nebulization 4 (four) times daily. For shortness of breath   levalbuterol 45 MCG/ACT inhaler Commonly known as:  XOPENEX HFA Inhale into the lungs every 4 (four) hours as needed for wheezing.   montelukast 10 MG tablet Commonly known as:  SINGULAIR Take 10 mg by mouth daily.   Multi-Vitamins Tabs Take 1 tablet by mouth 2 (two) times daily.   naproxen sodium 220 MG tablet Commonly known as:  ALEVE Take 220 mg by mouth as needed.   nitroGLYCERIN 0.4 MG SL tablet Commonly known as:   NITROSTAT Place 0.4 mg under the tongue every 5 (five) minutes as needed. For chest pain   omeprazole 20 MG capsule Commonly known as:  PRILOSEC Take 40 mg by mouth 2 (two) times daily.   potassium chloride 10 MEQ CR capsule Commonly known as:  MICRO-K Take 10 mEq by mouth daily.   prasugrel 10 MG Tabs tablet Commonly known as:  EFFIENT Take 10 mg by mouth daily.   predniSONE 20 MG tablet Commonly known as:  Deltasone Take 2 tablets (40 mg total) by mouth daily for 5 days. What changed:  You were already taking a medication with the same name, and this prescription was added. Make sure you understand how and when to take each.   predniSONE 10 MG tablet Commonly known as:  DELTASONE Take 1 tablet (10 mg total) by mouth daily. Start taking on:  November 10, 2018 What changed:  These instructions start on November 10, 2018. If you are unsure what to do until then, ask your doctor or other care provider.   rosuvastatin 40 MG tablet Commonly known as:  CRESTOR Take 40 mg by mouth daily.   terazosin 2 MG capsule Commonly known as:  HYTRIN Take 2 mg by mouth at bedtime.   triamcinolone cream 0.5 % Commonly known as:  KENALOG Apply 1 application topically 2 (two) times daily as needed (for skin irritation).      Follow-up Information    [name], MD Follow up in 1 week(s).   Specialty:  Family Medicine Contact information: [address] [place] [address] [phone]          Allergies  Allergen Reactions  . Demerol Other (See Comments)    Abnormal behavior  . Zolpidem Tartrate Other (See Comments)    Pt. Became confused and aggitated    Consultations:  None   Procedures/Studies: Dg Ribs Unilateral Right  Result Date: 11/03/2018 CLINICAL DATA:  [place] yesterday with right lateral rib pain. EXAM: RIGHT RIBS - 2 VIEW COMPARISON:  Chest radiography same day FINDINGS: No fracture or other bone lesions are seen involving the ribs. IMPRESSION:  Negative. Electronically Signed   By: Nelson Chimes M.D.   On: 11/03/2018 10:38   Dg Shoulder Right  Result Date: 11/01/2018 CLINICAL DATA:  Fall onto outstretched arm EXAM: RIGHT SHOULDER - 2+ VIEW COMPARISON:  None. FINDINGS: Transverse humeral neck fracture with 7 mm of displacement medially on the AP view. On the scapular Y-view the head does appear located, but there is inward rotation. IMPRESSION: 1. Transverse surgical neck humerus fracture with 7 mm displacement on the AP view, possibly greater based on the scapular Y-view. 2. Glenohumeral location is difficult to assess on the scapular Y-view, favor that the humeral head is located. Electronically Signed   By: Monte Fantasia M.D.   On: 11/01/2018 18:53   Dg Wrist Complete Right  Result Date: 11/01/2018 CLINICAL DATA:  Right wrist pain after fall. EXAM: RIGHT WRIST - COMPLETE 3+ VIEW COMPARISON:  None. FINDINGS: There is no evidence of fracture or dislocation. There is no evidence of arthropathy or other focal bone abnormality. Soft tissues are unremarkable. IMPRESSION: Negative. Electronically Signed   By: Marijo Conception, M.D.   On: 11/01/2018 18:53   Dg Chest Portable 1 View  Result Date: 11/03/2018 CLINICAL DATA:  Fever and shortness of breath EXAM: PORTABLE CHEST 1 VIEW COMPARISON:  02/17/2017 FINDINGS: Chronic interstitial coarsening likely related to the patient's COPD. Chronic hyperinflation. No consolidation, effusion, or pneumothorax. Dual-chamber ICD/pacer leads from the left. Normal heart size. Aortic tortuosity. IMPRESSION: Chronic bronchitic markings.  No focal airspace disease. Electronically Signed   By: Monte Fantasia M.D.   On: 11/03/2018 08:35     Discharge Exam: Vitals:   11/04/18 1030 11/04/18 1138  BP:    Pulse:    Resp:    Temp:    SpO2: 94% 94%   Vitals:   11/04/18 0513 11/04/18 0957 11/04/18 1030 11/04/18 1138  BP: 103/64     Pulse: 77     Resp: 18     Temp: 98.4 F (36.9 C)     TempSrc: Oral     SpO2:  96% 96% 94% 94%  Weight:      Height:        General: Pt is alert, awake, not in acute distress Cardiovascular: RRR, S1/S2 +, no rubs, no  gallops Respiratory: CTA bilaterally, no wheezing, no rhonchi, currently on 3 to 4 L nasal cannula. Abdominal: Soft, NT, ND, bowel sounds + Extremities: no edema, no cyanosis    The results of significant diagnostics from this hospitalization (including imaging, microbiology, ancillary and laboratory) are listed below for reference.     Microbiology: Recent Results (from the past 240 hour(s))  Blood culture (routine x 2)     Status: None (Preliminary result)   Collection Time: 11/03/18  9:15 AM  Result Value Ref Range Status   Specimen Description BLOOD LEFT ARM  Final   Special Requests   Final    BOTTLES DRAWN AEROBIC AND ANAEROBIC Blood Culture results may not be optimal due to an inadequate volume of blood received in culture bottles   Culture   Final    NO GROWTH < 24 HOURS Performed at [facility], [address] [place], [place] [address]    Report Status PENDING  Incomplete  Blood culture (routine x 2)     Status: None (Preliminary result)   Collection Time: 11/03/18  9:20 AM  Result Value Ref Range Status   Specimen Description BLOOD LEFT HAND  Final   Special Requests   Final    BOTTLES DRAWN AEROBIC AND ANAEROBIC Blood Culture adequate volume   Culture   Final    NO GROWTH < 24 HOURS Performed at [facility], [address] [place], [place] [address]    Report Status PENDING  Incomplete  Respiratory Panel by PCR     Status: None   Collection Time: 11/03/18  4:00 PM  Result Value Ref Range Status   Adenovirus NOT DETECTED NOT DETECTED Final   Coronavirus 229E NOT DETECTED NOT DETECTED Final    Comment: (NOTE) The Coronavirus on the Respiratory Panel, DOES NOT test for the novel  Coronavirus (2019 nCoV)    Coronavirus HKU1 NOT DETECTED NOT DETECTED Final   Coronavirus NL63 NOT DETECTED NOT DETECTED Final   Coronavirus  OC43 NOT DETECTED NOT DETECTED Final   Metapneumovirus NOT DETECTED NOT DETECTED Final   Rhinovirus / Enterovirus NOT DETECTED NOT DETECTED Final   Influenza A NOT DETECTED NOT DETECTED Final   Influenza B NOT DETECTED NOT DETECTED Final   Parainfluenza Virus 1 NOT DETECTED NOT DETECTED Final   Parainfluenza Virus 2 NOT DETECTED NOT DETECTED Final   Parainfluenza Virus 3 NOT DETECTED NOT DETECTED Final   Parainfluenza Virus 4 NOT DETECTED NOT DETECTED Final   Respiratory Syncytial Virus NOT DETECTED NOT DETECTED Final   Bordetella pertussis NOT DETECTED NOT DETECTED Final   Chlamydophila pneumoniae NOT DETECTED NOT DETECTED Final   Mycoplasma pneumoniae NOT DETECTED NOT DETECTED Final    Comment: Performed at [name], [place] [address], [place], [place] [address]     Labs: BNP (last 3 results) No results for input(s): BNP in the last 8760 hours. Basic Metabolic Panel: Recent Labs  Lab 11/03/18 0923 11/04/18 0544  NA 142 141  K 4.2 4.7  CL 106 107  CO2 28 25  GLUCOSE 150* 199*  BUN 27* 28*  CREATININE 1.80* 1.68*  CALCIUM 8.4* 8.1*   Liver Function Tests: No results for input(s): AST, ALT, ALKPHOS, BILITOT, PROT, ALBUMIN in the last 168 hours. No results for input(s): LIPASE, AMYLASE in the last 168 hours. No results for input(s): AMMONIA in the last 168 hours. CBC: Recent Labs  Lab 11/03/18 0923 11/04/18 0544  WBC 11.5* 9.0  NEUTROABS 9.4*  --   HGB  10.0* 10.0*  HCT 32.0* 33.1*  MCV 102.2* 104.1*  PLT 148* 144*   Cardiac Enzymes: No results for input(s): CKTOTAL, CKMB, CKMBINDEX, TROPONINI in the last 168 hours. BNP: Invalid input(s): POCBNP CBG: No results for input(s): GLUCAP in the last 168 hours. D-Dimer No results for input(s): DDIMER in the last 72 hours. Hgb A1c No results for input(s): HGBA1C in the last 72 hours. Lipid Profile No results for input(s): CHOL, HDL, LDLCALC, TRIG, CHOLHDL, LDLDIRECT in the last 72 hours. Thyroid function  studies No results for input(s): TSH, T4TOTAL, T3FREE, THYROIDAB in the last 72 hours.  Invalid input(s): FREET3 Anemia work up No results for input(s): VITAMINB12, FOLATE, FERRITIN, TIBC, IRON, RETICCTPCT in the last 72 hours. Urinalysis    Component Value Date/Time   COLORURINE YELLOW 11/03/2018 0959   APPEARANCEUR CLOUDY (A) 11/03/2018 0959   LABSPEC 1.023 11/03/2018 0959   PHURINE 5.0 11/03/2018 0959   GLUCOSEU NEGATIVE 11/03/2018 0959   HGBUR LARGE (A) 11/03/2018 0959   BILIRUBINUR NEGATIVE 11/03/2018 0959   KETONESUR 5 (A) 11/03/2018 0959   PROTEINUR 30 (A) 11/03/2018 0959   UROBILINOGEN 0.2 06/10/2015 1430   NITRITE NEGATIVE 11/03/2018 0959   LEUKOCYTESUR MODERATE (A) 11/03/2018 0959   Sepsis Labs Invalid input(s): PROCALCITONIN,  WBC,  LACTICIDVEN Microbiology Recent Results (from the past 240 hour(s))  Blood culture (routine x 2)     Status: None (Preliminary result)   Collection Time: 11/03/18  9:15 AM  Result Value Ref Range Status   Specimen Description BLOOD LEFT ARM  Final   Special Requests   Final    BOTTLES DRAWN AEROBIC AND ANAEROBIC Blood Culture results may not be optimal due to an inadequate volume of blood received in culture bottles   Culture   Final    NO GROWTH < 24 HOURS Performed at [facility], [address], [place], [place] [address]    Report Status PENDING  Incomplete  Blood culture (routine x 2)     Status: None (Preliminary result)   Collection Time: 11/03/18  9:20 AM  Result Value Ref Range Status   Specimen Description BLOOD LEFT HAND  Final   Special Requests   Final    BOTTLES DRAWN AEROBIC AND ANAEROBIC Blood Culture adequate volume   Culture   Final    NO GROWTH < 24 HOURS Performed at [facility], [address], [place], [place] [address]    Report Status PENDING  Incomplete  Respiratory Panel by PCR     Status: None   Collection Time: 11/03/18  4:00 PM  Result Value Ref Range Status   Adenovirus NOT DETECTED NOT DETECTED  Final   Coronavirus 229E NOT DETECTED NOT DETECTED Final    Comment: (NOTE) The Coronavirus on the Respiratory Panel, DOES NOT test for the novel  Coronavirus (2019 nCoV)    Coronavirus HKU1 NOT DETECTED NOT DETECTED Final   Coronavirus NL63 NOT DETECTED NOT DETECTED Final   Coronavirus OC43 NOT DETECTED NOT DETECTED Final   Metapneumovirus NOT DETECTED NOT DETECTED Final   Rhinovirus / Enterovirus NOT DETECTED NOT DETECTED Final   Influenza A NOT DETECTED NOT DETECTED Final   Influenza B NOT DETECTED NOT DETECTED Final   Parainfluenza Virus 1 NOT DETECTED NOT DETECTED Final   Parainfluenza Virus 2 NOT DETECTED NOT DETECTED Final   Parainfluenza Virus 3 NOT DETECTED NOT DETECTED Final   Parainfluenza Virus 4 NOT DETECTED NOT DETECTED Final   Respiratory Syncytial Virus NOT DETECTED NOT DETECTED Final  Bordetella pertussis NOT DETECTED NOT DETECTED Final   Chlamydophila pneumoniae NOT DETECTED NOT DETECTED Final   Mycoplasma pneumoniae NOT DETECTED NOT DETECTED Final    Comment: Performed at [facility], [place] [address] [place], [place] [address]     Time coordinating discharge: 35 minutes  SIGNED:   Rodena Goldmann, DO [facility] 11/04/2018, 11:52 AM  If 7PM-7AM, please contact night-coverage [email or web address] Password [email or web address]

## 2018-11-08 DIAGNOSIS — N390 Urinary tract infection, site not specified: Secondary | ICD-10-CM | POA: Diagnosis not present

## 2018-11-08 DIAGNOSIS — R7309 Other abnormal glucose: Secondary | ICD-10-CM | POA: Diagnosis not present

## 2018-11-08 DIAGNOSIS — I10 Essential (primary) hypertension: Secondary | ICD-10-CM | POA: Diagnosis not present

## 2018-11-08 DIAGNOSIS — J441 Chronic obstructive pulmonary disease with (acute) exacerbation: Secondary | ICD-10-CM | POA: Diagnosis not present

## 2018-11-08 DIAGNOSIS — E782 Mixed hyperlipidemia: Secondary | ICD-10-CM | POA: Diagnosis not present

## 2018-11-08 DIAGNOSIS — Z1389 Encounter for screening for other disorder: Secondary | ICD-10-CM | POA: Diagnosis not present

## 2018-11-08 DIAGNOSIS — J9620 Acute and chronic respiratory failure, unspecified whether with hypoxia or hypercapnia: Secondary | ICD-10-CM | POA: Diagnosis not present

## 2018-11-08 DIAGNOSIS — Z0001 Encounter for general adult medical examination with abnormal findings: Secondary | ICD-10-CM | POA: Diagnosis not present

## 2018-11-08 LAB — CULTURE, BLOOD (ROUTINE X 2)
Culture: NO GROWTH
Culture: NO GROWTH
Special Requests: ADEQUATE

## 2018-11-10 ENCOUNTER — Other Ambulatory Visit: Payer: Self-pay

## 2018-11-10 NOTE — Patient Outreach (Signed)
[facility]) Care Management  11/10/2018  [name] Oct 23, 1934 [id number]   EMMI- General Discharge RED ON EMMI ALERT Day # 4 Date: 11/09/2018  Red Alert Reason:  Sad/hopeless/anxious/empty? Yes    Outreach attempt: spoke with patient. He is able to verify HIPAA. Patient states he is doing better today than he has in a while.  Addressed red alert with patient. He states he had some sad feelings about recently being in the hospital but denies any problems right now.  Patient states he saw his PCP on yesterday and things checked out good.  He reports he is taking all his medication as prescribed and denies any problems with his medications.  Patient denies any needs presently.     Plan: RN CM will close case.    Jone Baseman, RN, MSN [facility] Care Management Care Management Coordinator Direct Line [phone] Toll Free: [phone]  Fax: [phone]

## 2018-11-13 NOTE — ED Provider Notes (Signed)
[place] [facility] Provider Note   CSN: [id number] Arrival date & time: 11/03/18  4098    History   Chief Complaint Chief Complaint  Patient presents with  . Fall    HPI [name] is a 84 y.o. male.     HPI   84 year old male with right chest pain.  Patient recently had a fall and was evaluated in the emergency room.  Began developing chest pain shortly after.  Worse with movement and deep breathing.  Feels like he cannot get a deep breath because of the pain.  Denies any new injury/fall since last evaluation.  No fevers or chills.  No unusual leg pain or swelling.   Past Medical History:  Diagnosis Date  . Anemia   . Angina   . Anticoagulated on warfarin 07/17/2011  . Arthritis   . Asthma   . Atherosclerosis of native arteries of the extremities with intermittent claudication 08/13/2011  . Blood transfusion 3 weeks ago  . CAD 04/23/2009  . Cancer ([place])   . CHF (congestive heart failure) ([place])   . Chronic kidney disease   . COPD (chronic obstructive pulmonary disease) ([place])   . Emphysema   . GERD (gastroesophageal reflux disease)   . Heart attack [facility]) October 27,2012 and July 28, 2011  . Heart attack [facility]) October  27,2012 and December 3,2012   . Hypertension   . ICD (implantable cardiac defibrillator) in place   . Myocardial infarction ([place])   . Pneumonia   . S/P ICD (internal cardiac defibrillator) procedure, 6.17.13 02/10/2012  . Shortness of breath   . Ventricular thrombus following MI (myocardial infarction) ([place]) 11/12   on coumadin    Patient Active Problem List   Diagnosis Date Noted  . Acute hypoxemic respiratory failure ([place]) 11/03/2018  . Acute metabolic encephalopathy 11/91/4782  . Acute lower UTI 11/03/2018  . Cardiomyopathy, ischemic 07/15/2015  . Chronic obstructive pulmonary disease ([place])   . 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 Viral bronchitis 06/11/2015  . Obesity 05/31/2015  . Chronic  hypoxemic respiratory failure ([place]) 05/31/2015  . COPD exacerbation ([place]) 05/30/2015  . Chronic respiratory failure ([place]) 11/23/2014  . S/P MDT ICD  6.17.13, with revision 02/10/12 for lead dislodgement 02/10/2012  . Atherosclerosis of native arteries of the extremities with intermittent claudication 08/13/2011  . Anticoagulated on warfarin 07/17/2011  . Left Ventricular thrombus s/p Anterior STEMI Oct 2012, Rx'd with Coumadin 07/02/2011  . Recent Anterior ST elevation (STEMI) myocardial infarction involving LAD, 05/2011 06/29/2011    Class: Status post  . Chronic systolic congestive heart failure 06/29/2011    Class: Acute  . CKD (chronic kidney disease) stage 3, GFR 30-59 ml/min (HCC) 06/29/2011  . COPD (chronic obstructive pulmonary disease) ([place]) 06/29/2011    Class: Chronic  . Prostate cancer ([place]) 06/29/2011    Class: Chronic  . CARDIOMYOPATHY, ISCHEMIC 05/08/2009  . DYSLIPIDEMIA 04/23/2009  . Essential hypertension 04/23/2009  . Coronary atherosclerosis 04/23/2009    Past Surgical History:  Procedure Laterality Date  . CARDIAC CATHETERIZATION    . CORONARY ANGIOGRAM N/A 07/28/2011   Procedure: CORONARY ANGIOGRAM;  Surgeon: Leonie Man, MD;  Location: [facility] CATH LAB;  Service: Cardiovascular;  Laterality: N/A;  . CORONARY ANGIOPLASTY    . CORONARY STENT PLACEMENT  06/21/11  . GROIN DEBRIDEMENT  07/18/2011   Procedure: [name] DEBRIDEMENT;  Surgeon: Angelia Mould, MD;  Location: [place];  Service: Vascular;  Laterality: Right;  exploration of right groin,evacuation  of right groin hematoma & repair of right superficial femoral artery  . IMPLANTABLE CARDIOVERTER DEFIBRILLATOR IMPLANT N/A 02/09/2012   Procedure: IMPLANTABLE CARDIOVERTER DEFIBRILLATOR IMPLANT;  Surgeon: Sanda Klein, MD;  Location: [place] CATH LAB;  Service: Cardiovascular;  Laterality: N/A;  . INSERT / REPLACE / REMOVE PACEMAKER  02/09/2012   ICD  . LEAD REVISION N/A 02/10/2012   Procedure: LEAD REVISION;   Surgeon: Sanda Klein, MD;  Location: [place] CATH LAB;  Service: Cardiovascular;  Laterality: N/A;  . PERCUTANEOUS CORONARY STENT INTERVENTION (PCI-S) N/A 07/28/2011   Procedure: PERCUTANEOUS CORONARY STENT INTERVENTION (PCI-S);  Surgeon: Leonie Man, MD;  Location: [facility] CATH LAB;  Service: Cardiovascular;  Laterality: N/A;        Home Medications    Prior to Admission medications   Medication Sig Start Date End Date Taking? Authorizing Provider  aspirin 81 MG tablet Take 81 mg by mouth daily.   Yes [provider]  bisoprolol (ZEBETA) 5 MG tablet Take 0.5 tablets (2.5 mg total) by mouth daily. 05/29/15  Yes Troy Sine, MD  carisoprodol (SOMA) 350 MG tablet Take 350 mg by mouth 4 (four) times daily as needed for muscle spasms.   Yes [provider]  chlorpheniramine (CHLOR-TRIMETON) 4 MG tablet Take 4 mg by mouth 2 (two) times daily.   Yes [provider]  diltiazem (CARDIZEM SR) 120 MG 12 hr capsule Take 120 mg by mouth 2 (two) times daily.   Yes [provider]  escitalopram (LEXAPRO) 20 MG tablet Take 20 mg by mouth daily. 10/04/18  Yes [provider]  ferrous sulfate 325 (65 FE) MG tablet Take 325 mg by mouth daily.   Yes [provider]  fluticasone (FLONASE) 50 MCG/ACT nasal spray Place 2 sprays into both nostrils every morning. *Occasional night use   Yes [provider]  furosemide (LASIX) 40 MG tablet Take 40 mg by mouth daily as needed for fluid.    Yes [provider]  gabapentin (NEURONTIN) 600 MG tablet Take 600 mg by mouth 2 (two) times daily.    Yes [provider]  HYDROcodone-acetaminophen (NORCO) 10-325 MG tablet Take 1 tablet by mouth 4 (four) times daily as needed. pain 01/28/13  Yes [provider]  ipratropium (ATROVENT) 0.03 % nasal spray Place 1 spray into both nostrils every 12 (twelve) hours.   Yes [provider]  ipratropium-albuterol (DUONEB) 0.5-2.5 (3) MG/3ML SOLN  Take 3 mLs by nebulization 4 (four) times daily.    Yes [provider]  levalbuterol Penne Lash HFA) 45 MCG/ACT inhaler Inhale into the lungs every 4 (four) hours as needed for wheezing.   Yes [provider]  levalbuterol (XOPENEX) 0.63 MG/3ML nebulizer solution Take 1 ampule by nebulization 4 (four) times daily. For shortness of breath   Yes [provider]  montelukast (SINGULAIR) 10 MG tablet Take 10 mg by mouth daily.     Yes [provider]  Multiple Vitamin (MULTI-VITAMINS) TABS Take 1 tablet by mouth 2 (two) times daily.   Yes [provider]  naproxen sodium (ALEVE) 220 MG tablet Take 220 mg by mouth as needed.   Yes [provider]  nitroGLYCERIN (NITROSTAT) 0.4 MG SL tablet Place 0.4 mg under the tongue every 5 (five) minutes as needed. For chest pain   Yes [provider]  omeprazole (PRILOSEC) 20 MG capsule Take 40 mg by mouth 2 (two) times daily.    Yes [provider]  potassium chloride (MICRO-K) 10 MEQ CR  capsule Take 10 mEq by mouth daily.    Yes [provider]  prasugrel (EFFIENT) 10 MG TABS tablet Take 10 mg by mouth daily.   Yes [provider]  rosuvastatin (CRESTOR) 40 MG tablet Take 40 mg by mouth daily.   Yes [provider]  terazosin (HYTRIN) 2 MG capsule Take 2 mg by mouth at bedtime.    Yes [provider]  triamcinolone cream (KENALOG) 0.5 % Apply 1 application topically 2 (two) times daily as needed (for skin irritation).    Yes [provider]  dextromethorphan-guaiFENesin (MUCINEX DM) 30-600 MG 12hr tablet Take 1 tablet by mouth 2 (two) times daily for 10 days. 11/04/18 11/14/18  Manuella Ghazi, Pratik D, DO  predniSONE (DELTASONE) 10 MG tablet Take 1 tablet (10 mg total) by mouth daily. 11/10/18   Heath Lark D, DO    Family History Family History  Problem Relation Age of Onset  . Diabetes type II Mother   . Coronary artery disease Mother   . Heart attack  Mother   . Coronary artery disease Father   . Diabetes type II Brother   . Coronary artery disease Brother   . Hypertension Sister   . Craniosynostosis Neg Hx   . Stroke Neg Hx     Social History Social History   Tobacco Use  . Smoking status: Former Smoker    Packs/day: 3.00    Years: 40.00    Pack years: 120.00    Types: Cigarettes    Last attempt to quit: 04/13/2005    Years since quitting: 13.5  . Smokeless tobacco: Never Used  Substance Use Topics  . Alcohol use: No  . Drug use: No     Allergies   Demerol and Zolpidem tartrate   Review of Systems Review of Systems  All systems reviewed and negative, other than as noted in HPI.  Physical Exam Updated Vital Signs BP 103/64 (BP Location: Left Arm)   Pulse 77   Temp 98.4 F (36.9 C) (Oral)   Resp 18   Ht 5\' 7"  (1.702 m)   Wt 97.5 kg   SpO2 94%   BMI 33.67 kg/m   Physical Exam Vitals signs and nursing note reviewed.  Constitutional:      General: He is not in acute distress.    Appearance: He is well-developed.  HENT:     Head: Normocephalic and atraumatic.  Eyes:     General:        Right eye: No discharge.        Left eye: No discharge.     Conjunctiva/sclera: Conjunctivae normal.  Neck:     Musculoskeletal: Neck supple.  Cardiovascular:     Rate and Rhythm: Normal rate and regular rhythm.     Heart sounds: Normal heart sounds. No murmur. No friction rub. No gallop.   Pulmonary:     Effort: Pulmonary effort is normal. No respiratory distress.     Breath sounds: Normal breath sounds.  Abdominal:     General: There is no distension.     Palpations: Abdomen is soft.     Tenderness: There is no abdominal tenderness.  Musculoskeletal:        General: No tenderness.     Comments: Right arm and sling.  Tenderness palpation along the lateral right chest wall.  No overlying skin changes.  No crepitus.  Symmetric breath sounds bilaterally but with some wheezing.  Shallow respiratory effort.  Skin:     General: Skin is  warm and dry.  Neurological:     Mental Status: He is alert.  Psychiatric:        Behavior: Behavior normal.        Thought Content: Thought content normal.      ED Treatments / Results  Labs (all labs ordered are listed, but only abnormal results are displayed) Labs Reviewed  CBC WITH DIFFERENTIAL/PLATELET - Abnormal; Notable for the following components:      Result Value   WBC 11.5 (*)    RBC 3.13 (*)    Hemoglobin 10.0 (*)    HCT 32.0 (*)    MCV 102.2 (*)    Platelets 148 (*)    Neutro Abs 9.4 (*)    Monocytes Absolute 1.1 (*)    All other components within normal limits  BASIC METABOLIC PANEL - Abnormal; Notable for the following components:   Glucose, Bld 150 (*)    BUN 27 (*)    Creatinine, Ser 1.80 (*)    Calcium 8.4 (*)    GFR calc non Af Amer 35 (*)    GFR calc Af Amer 40 (*)    All other components within normal limits  URINALYSIS, ROUTINE W REFLEX MICROSCOPIC - Abnormal; Notable for the following components:   APPearance CLOUDY (*)    Hgb urine dipstick LARGE (*)    Ketones, ur 5 (*)    Protein, ur 30 (*)    Leukocytes,Ua MODERATE (*)    RBC / HPF >50 (*)    WBC, UA >50 (*)    Bacteria, UA RARE (*)    All other components within normal limits  BASIC METABOLIC PANEL - Abnormal; Notable for the following components:   Glucose, Bld 199 (*)    BUN 28 (*)    Creatinine, Ser 1.68 (*)    Calcium 8.1 (*)    GFR calc non Af Amer 38 (*)    GFR calc Af Amer 43 (*)    All other components within normal limits  CBC - Abnormal; Notable for the following components:   RBC 3.18 (*)    Hemoglobin 10.0 (*)    HCT 33.1 (*)    MCV 104.1 (*)    Platelets 144 (*)    All other components within normal limits  CULTURE, BLOOD (ROUTINE X 2)  CULTURE, BLOOD (ROUTINE X 2)  URINE CULTURE  RESPIRATORY PANEL BY PCR  PROTIME-INR  LACTIC ACID, PLASMA  INFLUENZA PANEL BY PCR (TYPE A & B)  LACTIC ACID, PLASMA    EKG EKG Interpretation  Date/Time:   Wednesday November 03 2018 07:41:42 EDT Ventricular Rate:  111 PR Interval:    QRS Duration: 147 QT Interval:  362 QTC Calculation: 492 R Axis:   56 Text Interpretation:  Sinus tachycardia Right bundle branch block Anteroseptal infarct, age indeterminate Confirmed by Virgel Manifold [id number]) on 11/03/2018 8:24:36 AM   Radiology No results found.   Dg Ribs Unilateral Right  Result Date: 11/03/2018 CLINICAL DATA:  [place] yesterday with right lateral rib pain. EXAM: RIGHT RIBS - 2 VIEW COMPARISON:  Chest radiography same day FINDINGS: No fracture or other bone lesions are seen involving the ribs. IMPRESSION: Negative. Electronically Signed   By: Nelson Chimes M.D.   On: 11/03/2018 10:38   Dg Shoulder Right  Result Date: 11/01/2018 CLINICAL DATA:  Fall onto outstretched arm EXAM: RIGHT SHOULDER - 2+ VIEW COMPARISON:  None. FINDINGS: Transverse humeral neck fracture with 7 mm of displacement medially on the AP view. On the scapular  Y-view the head does appear located, but there is inward rotation. IMPRESSION: 1. Transverse surgical neck humerus fracture with 7 mm displacement on the AP view, possibly greater based on the scapular Y-view. 2. Glenohumeral location is difficult to assess on the scapular Y-view, favor that the humeral head is located. Electronically Signed   By: Monte Fantasia M.D.   On: 11/01/2018 18:53   Dg Wrist Complete Right  Result Date: 11/01/2018 CLINICAL DATA:  Right wrist pain after fall. EXAM: RIGHT WRIST - COMPLETE 3+ VIEW COMPARISON:  None. FINDINGS: There is no evidence of fracture or dislocation. There is no evidence of arthropathy or other focal bone abnormality. Soft tissues are unremarkable. IMPRESSION: Negative. Electronically Signed   By: Marijo Conception, M.D.   On: 11/01/2018 18:53   Dg Chest Portable 1 View  Result Date: 11/03/2018 CLINICAL DATA:  Fever and shortness of breath EXAM: PORTABLE CHEST 1 VIEW COMPARISON:  02/17/2017 FINDINGS: Chronic interstitial coarsening  likely related to the patient's COPD. Chronic hyperinflation. No consolidation, effusion, or pneumothorax. Dual-chamber ICD/pacer leads from the left. Normal heart size. Aortic tortuosity. IMPRESSION: Chronic bronchitic markings.  No focal airspace disease. Electronically Signed   By: Monte Fantasia M.D.   On: 11/03/2018 08:35    Procedures Procedures (including critical care time)  Medications Ordered in ED Medications  ipratropium-albuterol (DUONEB) 0.5-2.5 (3) MG/3ML nebulizer solution 3 mL (3 mLs Nebulization Given 11/03/18 0906)  acetaminophen (TYLENOL) tablet 650 mg (650 mg Oral Given 11/03/18 0847)  ceFEPIme (MAXIPIME) 2 g in sodium chloride 0.9 % 100 mL IVPB (0 g Intravenous Stopped 11/03/18 0955)     Initial Impression / Assessment and Plan / ED Course  I have reviewed the triage vital signs and the nursing notes.  Pertinent labs & imaging results that were available during my care of the patient were reviewed by me and considered in my medical decision making (see chart for details).        84 year old male with right-sided chest pain after mechanical fall.  Likely rib contusions.  No clear fractures noted on imaging.  Continue to be hypoxemic.  Is not breathing deeply.  Also some wheezing on exam.  Hypoxemia probably related to underlying lung disease and poor respiratory effort because of chest wall pain.  Given his advanced age, will admit for observation.  Final Clinical Impressions(s) / ED Diagnoses   Final diagnoses:  Fall, initial encounter    ED Discharge Orders         Ordered    predniSONE (DELTASONE) 10 MG tablet  Daily     11/04/18 1152    dextromethorphan-guaiFENesin (MUCINEX DM) 30-600 MG 12hr tablet  2 times daily     11/04/18 1152    cefdinir (OMNICEF) 300 MG capsule  2 times daily     11/04/18 1152    azithromycin (ZITHROMAX) 500 MG tablet  Daily     11/04/18 1152    predniSONE (DELTASONE) 20 MG tablet  Daily     11/04/18 1152    Increase activity  slowly     11/04/18 1152    Diet - low sodium heart healthy     11/04/18 1152           Virgel Manifold, MD 11/13/18 Vernelle Emerald

## 2018-11-15 ENCOUNTER — Other Ambulatory Visit (HOSPITAL_COMMUNITY): Payer: Self-pay | Admitting: Nephrology

## 2018-11-15 ENCOUNTER — Other Ambulatory Visit: Payer: Self-pay | Admitting: Nephrology

## 2018-11-15 DIAGNOSIS — N183 Chronic kidney disease, stage 3 unspecified: Secondary | ICD-10-CM

## 2019-01-26 ENCOUNTER — Encounter (HOSPITAL_COMMUNITY): Payer: Self-pay

## 2019-01-26 ENCOUNTER — Ambulatory Visit (HOSPITAL_COMMUNITY): Payer: Medicare Other

## 2019-03-22 ENCOUNTER — Other Ambulatory Visit: Payer: Self-pay

## 2019-03-22 ENCOUNTER — Encounter (HOSPITAL_COMMUNITY)
Admission: EM | Disposition: A | Discharge status: Home / Self Care | Payer: Self-pay | Source: Home / Self Care | Attending: Internal Medicine

## 2019-03-22 ENCOUNTER — Inpatient Hospital Stay (HOSPITAL_COMMUNITY)
Admission: EM | Admit: 2019-03-22 | Discharge: 2019-03-24 | DRG: 378 | Disposition: A | Discharge status: Home / Self Care | Payer: No Typology Code available for payment source | Attending: Internal Medicine | Admitting: Internal Medicine

## 2019-03-22 ENCOUNTER — Encounter (HOSPITAL_COMMUNITY): Payer: Self-pay | Admitting: Emergency Medicine

## 2019-03-22 DIAGNOSIS — Z8546 Personal history of malignant neoplasm of prostate: Secondary | ICD-10-CM

## 2019-03-22 DIAGNOSIS — D62 Acute posthemorrhagic anemia: Secondary | ICD-10-CM | POA: Diagnosis present

## 2019-03-22 DIAGNOSIS — K921 Melena: Secondary | ICD-10-CM

## 2019-03-22 DIAGNOSIS — J449 Chronic obstructive pulmonary disease, unspecified: Secondary | ICD-10-CM | POA: Diagnosis present

## 2019-03-22 DIAGNOSIS — I130 Hypertensive heart and chronic kidney disease with heart failure and stage 1 through stage 4 chronic kidney disease, or unspecified chronic kidney disease: Secondary | ICD-10-CM | POA: Diagnosis present

## 2019-03-22 DIAGNOSIS — K625 Hemorrhage of anus and rectum: Secondary | ICD-10-CM | POA: Diagnosis present

## 2019-03-22 DIAGNOSIS — I2510 Atherosclerotic heart disease of native coronary artery without angina pectoris: Secondary | ICD-10-CM | POA: Diagnosis present

## 2019-03-22 DIAGNOSIS — Z95810 Presence of automatic (implantable) cardiac defibrillator: Secondary | ICD-10-CM

## 2019-03-22 DIAGNOSIS — Z7982 Long term (current) use of aspirin: Secondary | ICD-10-CM

## 2019-03-22 DIAGNOSIS — Z7952 Long term (current) use of systemic steroids: Secondary | ICD-10-CM | POA: Diagnosis not present

## 2019-03-22 DIAGNOSIS — Z20828 Contact with and (suspected) exposure to other viral communicable diseases: Secondary | ICD-10-CM | POA: Diagnosis present

## 2019-03-22 DIAGNOSIS — I252 Old myocardial infarction: Secondary | ICD-10-CM

## 2019-03-22 DIAGNOSIS — Z79899 Other long term (current) drug therapy: Secondary | ICD-10-CM

## 2019-03-22 DIAGNOSIS — Z923 Personal history of irradiation: Secondary | ICD-10-CM | POA: Diagnosis not present

## 2019-03-22 DIAGNOSIS — N390 Urinary tract infection, site not specified: Secondary | ICD-10-CM | POA: Diagnosis present

## 2019-03-22 DIAGNOSIS — Z87891 Personal history of nicotine dependence: Secondary | ICD-10-CM | POA: Diagnosis not present

## 2019-03-22 DIAGNOSIS — I509 Heart failure, unspecified: Secondary | ICD-10-CM | POA: Diagnosis present

## 2019-03-22 DIAGNOSIS — K5730 Diverticulosis of large intestine without perforation or abscess without bleeding: Secondary | ICD-10-CM | POA: Diagnosis not present

## 2019-03-22 DIAGNOSIS — K5731 Diverticulosis of large intestine without perforation or abscess with bleeding: Principal | ICD-10-CM | POA: Diagnosis present

## 2019-03-22 DIAGNOSIS — D124 Benign neoplasm of descending colon: Secondary | ICD-10-CM | POA: Diagnosis not present

## 2019-03-22 DIAGNOSIS — Z9981 Dependence on supplemental oxygen: Secondary | ICD-10-CM

## 2019-03-22 DIAGNOSIS — K922 Gastrointestinal hemorrhage, unspecified: Secondary | ICD-10-CM

## 2019-03-22 DIAGNOSIS — K644 Residual hemorrhoidal skin tags: Secondary | ICD-10-CM | POA: Diagnosis present

## 2019-03-22 DIAGNOSIS — N183 Chronic kidney disease, stage 3 (moderate): Secondary | ICD-10-CM | POA: Diagnosis present

## 2019-03-22 DIAGNOSIS — K219 Gastro-esophageal reflux disease without esophagitis: Secondary | ICD-10-CM | POA: Diagnosis present

## 2019-03-22 DIAGNOSIS — Z7951 Long term (current) use of inhaled steroids: Secondary | ICD-10-CM | POA: Diagnosis not present

## 2019-03-22 DIAGNOSIS — D123 Benign neoplasm of transverse colon: Secondary | ICD-10-CM | POA: Diagnosis not present

## 2019-03-22 DIAGNOSIS — K635 Polyp of colon: Secondary | ICD-10-CM | POA: Diagnosis present

## 2019-03-22 HISTORY — PX: FLEXIBLE SIGMOIDOSCOPY: SHX5431

## 2019-03-22 HISTORY — DX: Malignant neoplasm of prostate: C61

## 2019-03-22 LAB — BASIC METABOLIC PANEL
Anion gap: 10 (ref 5–15)
BUN: 24 mg/dL — ABNORMAL HIGH (ref 8–23)
CO2: 26 mmol/L (ref 22–32)
Calcium: 8.4 mg/dL — ABNORMAL LOW (ref 8.9–10.3)
Chloride: 104 mmol/L (ref 98–111)
Creatinine, Ser: 1.87 mg/dL — ABNORMAL HIGH (ref 0.61–1.24)
GFR calc Af Amer: 38 mL/min — ABNORMAL LOW (ref >60)
GFR calc non Af Amer: 33 mL/min — ABNORMAL LOW (ref >60)
Glucose, Bld: 195 mg/dL — ABNORMAL HIGH (ref 70–99)
Potassium: 4.4 mmol/L (ref 3.5–5.1)
Sodium: 140 mmol/L (ref 135–145)

## 2019-03-22 LAB — CBC WITH DIFFERENTIAL/PLATELET
Abs Immature Granulocytes: 0.03 10*3/uL (ref 0.00–0.07)
Basophils Absolute: 0 10*3/uL (ref 0.0–0.1)
Basophils Relative: 1 %
Eosinophils Absolute: 0.3 10*3/uL (ref 0.0–0.5)
Eosinophils Relative: 3 %
HCT: 34.8 % — ABNORMAL LOW (ref 39.0–52.0)
Hemoglobin: 11.1 g/dL — ABNORMAL LOW (ref 13.0–17.0)
Immature Granulocytes: 0 %
Lymphocytes Relative: 18 %
Lymphs Abs: 1.4 10*3/uL (ref 0.7–4.0)
MCH: 31.6 pg (ref 26.0–34.0)
MCHC: 31.9 g/dL (ref 30.0–36.0)
MCV: 99.1 fL (ref 80.0–100.0)
Monocytes Absolute: 0.6 10*3/uL (ref 0.1–1.0)
Monocytes Relative: 8 %
Neutro Abs: 5.6 10*3/uL (ref 1.7–7.7)
Neutrophils Relative %: 70 %
Platelets: 216 10*3/uL (ref 150–400)
RBC: 3.51 MIL/uL — ABNORMAL LOW (ref 4.22–5.81)
RDW: 12.9 % (ref 11.5–15.5)
WBC: 8 10*3/uL (ref 4.0–10.5)
nRBC: 0 % (ref 0.0–0.2)

## 2019-03-22 LAB — SARS CORONAVIRUS 2 BY RT PCR (HOSPITAL ORDER, PERFORMED IN ~~LOC~~ HOSPITAL LAB): SARS Coronavirus 2: NEGATIVE

## 2019-03-22 LAB — PROTIME-INR
INR: 1 (ref 0.8–1.2)
Prothrombin Time: 13.4 seconds (ref 11.4–15.2)

## 2019-03-22 LAB — HEMOGLOBIN AND HEMATOCRIT, BLOOD
HCT: 28.7 % — ABNORMAL LOW (ref 39.0–52.0)
Hemoglobin: 9.2 g/dL — ABNORMAL LOW (ref 13.0–17.0)

## 2019-03-22 SURGERY — SIGMOIDOSCOPY, FLEXIBLE
Anesthesia: Moderate Sedation

## 2019-03-22 MED ORDER — BISOPROLOL FUMARATE 5 MG PO TABS
2.5000 mg | ORAL_TABLET | Freq: Every day | ORAL | Status: DC
  Administered 2019-03-22 – 2019-03-24 (×3): 2.5 mg via ORAL
  Filled 2019-03-22 (×3): qty 1

## 2019-03-22 MED ORDER — IPRATROPIUM-ALBUTEROL 0.5-2.5 (3) MG/3ML IN SOLN
3.0000 mL | RESPIRATORY_TRACT | Status: DC | PRN
  Administered 2019-03-22 – 2019-03-23 (×2): 3 mL via RESPIRATORY_TRACT
  Filled 2019-03-22: qty 3

## 2019-03-22 MED ORDER — MIDAZOLAM HCL 5 MG/5ML IJ SOLN
INTRAMUSCULAR | Status: AC
  Filled 2019-03-22: qty 5

## 2019-03-22 MED ORDER — PREDNISONE 10 MG PO TABS
10.0000 mg | ORAL_TABLET | Freq: Every day | ORAL | Status: DC
  Administered 2019-03-22 – 2019-03-24 (×3): 10 mg via ORAL
  Filled 2019-03-22 (×3): qty 1

## 2019-03-22 MED ORDER — POLYETHYLENE GLYCOL 3350 17 G PO PACK
17.0000 g | PACK | ORAL | Status: AC
  Administered 2019-03-22 (×4): 17 g via ORAL
  Filled 2019-03-22 (×4): qty 1

## 2019-03-22 MED ORDER — DILTIAZEM HCL ER 60 MG PO CP12
120.0000 mg | ORAL_CAPSULE | Freq: Two times a day (BID) | ORAL | Status: DC
  Administered 2019-03-22 – 2019-03-24 (×3): 120 mg via ORAL
  Filled 2019-03-22 (×7): qty 2

## 2019-03-22 MED ORDER — SODIUM CHLORIDE 0.9 % IV BOLUS: 500.0000 mL | INTRAVENOUS | Status: AC

## 2019-03-22 MED ORDER — ONDANSETRON HCL 4 MG PO TABS: 4.0000 mg | ORAL_TABLET | Freq: Four times a day (QID) | ORAL | Status: DC | PRN

## 2019-03-22 MED ORDER — IPRATROPIUM-ALBUTEROL 0.5-2.5 (3) MG/3ML IN SOLN: 3.0000 mL | Freq: Four times a day (QID) | RESPIRATORY_TRACT | Status: DC

## 2019-03-22 MED ORDER — PANTOPRAZOLE SODIUM 40 MG IV SOLR
40.0000 mg | INTRAVENOUS | Status: DC
  Administered 2019-03-22 – 2019-03-23 (×2): 40 mg via INTRAVENOUS
  Filled 2019-03-22 (×2): qty 40

## 2019-03-22 MED ORDER — SODIUM CHLORIDE 0.9 % IV SOLN
INTRAVENOUS | Status: DC
  Administered 2019-03-22: 13:00:00 via INTRAVENOUS

## 2019-03-22 MED ORDER — MIDAZOLAM HCL 5 MG/5ML IJ SOLN
INTRAMUSCULAR | Status: DC | PRN
  Administered 2019-03-22 (×2): 1 mg via INTRAVENOUS
  Administered 2019-03-22: 2 mg via INTRAVENOUS

## 2019-03-22 MED ORDER — IPRATROPIUM-ALBUTEROL 0.5-2.5 (3) MG/3ML IN SOLN
3.0000 mL | Freq: Three times a day (TID) | RESPIRATORY_TRACT | Status: DC
  Administered 2019-03-22 – 2019-03-24 (×4): 3 mL via RESPIRATORY_TRACT
  Filled 2019-03-22 (×5): qty 3

## 2019-03-22 MED ORDER — FENTANYL CITRATE (PF) 100 MCG/2ML IJ SOLN
INTRAMUSCULAR | Status: AC
  Filled 2019-03-22: qty 2

## 2019-03-22 MED ORDER — BISACODYL 5 MG PO TBEC
10.0000 mg | DELAYED_RELEASE_TABLET | ORAL | Status: AC
  Administered 2019-03-22 (×2): 10 mg via ORAL
  Filled 2019-03-22 (×2): qty 2

## 2019-03-22 MED ORDER — LACTATED RINGERS IV SOLN
INTRAVENOUS | Status: AC
  Administered 2019-03-22: 17:00:00 via INTRAVENOUS

## 2019-03-22 MED ORDER — FENTANYL CITRATE (PF) 100 MCG/2ML IJ SOLN
INTRAMUSCULAR | Status: DC | PRN
  Administered 2019-03-22 (×2): 25 ug via INTRAVENOUS

## 2019-03-22 MED ORDER — SODIUM CHLORIDE 0.9 % IV BOLUS: 1000.0000 mL | Freq: Once | INTRAVENOUS | Status: DC

## 2019-03-22 MED ORDER — ESCITALOPRAM OXALATE 10 MG PO TABS
20.0000 mg | ORAL_TABLET | Freq: Every day | ORAL | Status: DC
  Administered 2019-03-22 – 2019-03-24 (×3): 20 mg via ORAL
  Filled 2019-03-22 (×4): qty 2

## 2019-03-22 MED ORDER — ONDANSETRON HCL 4 MG/2ML IJ SOLN: 4.0000 mg | Freq: Four times a day (QID) | INTRAMUSCULAR | Status: DC | PRN

## 2019-03-22 MED ORDER — POTASSIUM CHLORIDE IN NACL 20-0.9 MEQ/L-% IV SOLN
INTRAVENOUS | Status: DC
  Administered 2019-03-22: 15:00:00 via INTRAVENOUS

## 2019-03-22 NOTE — H&P (View-Only) (Signed)
Referring Provider: [facility] Primary Care Physician:  Redmond School, MD Primary Gastroenterologist:  [facility]  Date of Admission: 03/22/19  Date of Consultation: 03/22/19  Reason for Consultation:  Rectal Bleeding  HPI:  [name] is a 84 y.o. year old male with past medical history significant for  COPD on chronic home O2, hypertension, CHF, CAD, status post ICD, CKD, Anemia, prostate cancer, and GERD who presented to the ED on 03/22/19 with history of several days of blood flecks in stool that increased to having mostly bloody stools yesterday. Subsequently, he was admitted for rectal bleeding. Aspirin and Prasugrel have been put on hold.   Labs in the ED: Hemoglobin 11.1, Cr 1.87, INR 1.0.  Last colonoscopy in December 04, 2002. Unable to advance colonoscope past sigmoid colon. Noted significant diverticulosis in sigmoid colon and significant narrowing of sigmoid without obvious obstruction.  Barium Enema 12-04-02 with pronounced colon spasm, extensive sigmoid and descending colon diverticulosis, no stricture or evidence of malignancy. Colonoscopy in December 03, 2000 prior to this with 8 mm sessile polyp in descending colon and sigmoid diverticulosis. Pathology with tubular adenoma.   Today patients daughter is in the room with him. He states over the last week he was having spots of blood with wiping. Patient has a long history of intermittent tissue hematochezia in the past due to hemorrhoids. Between 8pm and 10 pm last night, he started having significant increase with stools becoming bloody and blood in the water. Around 2 am states blood came out in gushes. He has continued to have ongoing rectal bleeding even without BMs. Dark red blood in brief now. Some clots. Also developed diarrhea yesterday with the bloody stools. Bowels have been doing well prior to this. Daily and soft formed. Normal caliber. Using Metamucil over last several months.   No abdominal pain. No n/v, no  melena. No reflux or heartburn. No epigastric pain. States he has a history of swallowing trouble. Reports history of EGD with dilation, and states and was told he has pockets in his esophagus. Also reports pill camera he had to swallow in the past. Sees GI at [facility] in [place]. Feels his swallowing is starting to get worse again with water wanting to come up several times before it goes down and occasionally bringing food back up after eating.   Admits to some lightheadedness/dizziness. No fever, chills. No unintentional weight loss. Appetite is good. Muscle tone is decreased as he doesn't have much activity.  No chest pain, palpitations, sob, coughing, wheezing.   Patient no longer taking Effient. Only on aspirin.  3L nasal canula at home.  Prostate cancer s/p seed implants.  Past Medical History:  Diagnosis Date  . Anemia   . Angina   . Anticoagulated on warfarin 07/17/2011  . Arthritis   . Asthma   . Atherosclerosis of native arteries of the extremities with intermittent claudication 08/13/2011  . Blood transfusion 3 weeks ago  . CAD 04/23/2009  . Cancer ([place])   . CHF (congestive heart failure) ([place])   . Chronic kidney disease   . COPD (chronic obstructive pulmonary disease) ([place])   . Emphysema   . GERD (gastroesophageal reflux disease)   . Heart attack [facility]) October 27,2012 and July 28, 2011  . Heart attack [facility]) October  27,2012 and December 3,2012   . Hypertension   . ICD (implantable cardiac defibrillator) in place   . Myocardial infarction ([place])   . Pneumonia   . S/P ICD (internal cardiac defibrillator) procedure, 6.17.13 02/10/2012  .  Shortness of breath   . Ventricular thrombus following MI (myocardial infarction) ([place]) 11/12   on coumadin    Past Surgical History:  Procedure Laterality Date  . CARDIAC CATHETERIZATION    . CORONARY ANGIOGRAM N/A 07/28/2011   Procedure: CORONARY ANGIOGRAM;  Surgeon: Leonie Man, MD;  Location: [facility] CATH LAB;  Service:  Cardiovascular;  Laterality: N/A;  . CORONARY ANGIOPLASTY    . CORONARY STENT PLACEMENT  06/21/11  . GROIN DEBRIDEMENT  07/18/2011   Procedure: [name] DEBRIDEMENT;  Surgeon: Angelia Mould, MD;  Location: [facility] OR;  Service: Vascular;  Laterality: Right;  exploration of right groin,evacuation of right groin hematoma & repair of right superficial femoral artery  . IMPLANTABLE CARDIOVERTER DEFIBRILLATOR IMPLANT N/A 02/09/2012   Procedure: IMPLANTABLE CARDIOVERTER DEFIBRILLATOR IMPLANT;  Surgeon: Sanda Klein, MD;  Location: [facility] CATH LAB;  Service: Cardiovascular;  Laterality: N/A;  . INSERT / REPLACE / REMOVE PACEMAKER  02/09/2012   ICD  . LEAD REVISION N/A 02/10/2012   Procedure: LEAD REVISION;  Surgeon: Sanda Klein, MD;  Location: [facility] CATH LAB;  Service: Cardiovascular;  Laterality: N/A;  . PERCUTANEOUS CORONARY STENT INTERVENTION (PCI-S) N/A 07/28/2011   Procedure: PERCUTANEOUS CORONARY STENT INTERVENTION (PCI-S);  Surgeon: Leonie Man, MD;  Location: [facility] CATH LAB;  Service: Cardiovascular;  Laterality: N/A;    Prior to Admission medications   Medication Sig Start Date End Date Taking? Authorizing Provider  aspirin 81 MG tablet Take 81 mg by mouth daily.    [provider]  bisoprolol (ZEBETA) 5 MG tablet Take 0.5 tablets (2.5 mg total) by mouth daily. 05/29/15   Troy Sine, MD  carisoprodol (SOMA) 350 MG tablet Take 350 mg by mouth 4 (four) times daily as needed for muscle spasms.    [provider]  chlorpheniramine (CHLOR-TRIMETON) 4 MG tablet Take 4 mg by mouth 2 (two) times daily.    [provider]  diltiazem (CARDIZEM SR) 120 MG 12 hr capsule Take 120 mg by mouth 2 (two) times daily.    [provider]  escitalopram (LEXAPRO) 20 MG tablet Take 20 mg by mouth daily. 10/04/18   [provider]  ferrous sulfate 325 (65 FE) MG tablet Take 325 mg by mouth daily.    [provider]  fluticasone (FLONASE) 50 MCG/ACT nasal spray  Place 2 sprays into both nostrils every morning. *Occasional night use    [provider]  furosemide (LASIX) 40 MG tablet Take 40 mg by mouth daily as needed for fluid.     [provider]  gabapentin (NEURONTIN) 600 MG tablet Take 600 mg by mouth 2 (two) times daily.     [provider]  HYDROcodone-acetaminophen (NORCO) 10-325 MG tablet Take 1 tablet by mouth 4 (four) times daily as needed. pain 01/28/13   [provider]  ipratropium (ATROVENT) 0.03 % nasal spray Place 1 spray into both nostrils every 12 (twelve) hours.    [provider]  ipratropium-albuterol (DUONEB) 0.5-2.5 (3) MG/3ML SOLN Take 3 mLs by nebulization 4 (four) times daily.     [provider]  levalbuterol Penne Lash HFA) 45 MCG/ACT inhaler Inhale into the lungs every 4 (four) hours as needed for wheezing.    [provider]  levalbuterol Penne Lash) 0.63 MG/3ML nebulizer solution Take 1 ampule by nebulization 4 (four) times daily. For shortness of breath    [provider]  montelukast (SINGULAIR) 10 MG tablet Take 10 mg by mouth daily.      [provider]  Multiple Vitamin (MULTI-VITAMINS) TABS Take 1 tablet by mouth 2 (two) times daily.    [provider]  naproxen sodium (ALEVE) 220 MG tablet Take 220 mg by mouth as needed.    [provider]  nitroGLYCERIN (NITROSTAT) 0.4 MG SL tablet Place 0.4 mg under the tongue every 5 (five) minutes as needed. For chest pain    [provider]  omeprazole (PRILOSEC) 20 MG capsule Take 40 mg by mouth 2 (two) times daily.     [provider]  potassium chloride (MICRO-K) 10 MEQ CR capsule Take 10 mEq by mouth daily.     [provider]  prasugrel (EFFIENT) 10 MG TABS tablet Take 10 mg by mouth daily.    [provider]  predniSONE (DELTASONE) 10 MG tablet Take 1 tablet (10 mg total) by mouth daily. 11/10/18   Manuella Ghazi, Pratik D, DO  rosuvastatin (CRESTOR) 40 MG  tablet Take 40 mg by mouth daily.    [provider]  terazosin (HYTRIN) 2 MG capsule Take 2 mg by mouth at bedtime.     [provider]  triamcinolone cream (KENALOG) 0.5 % Apply 1 application topically 2 (two) times daily as needed (for skin irritation).     [provider]    No current facility-administered medications for this encounter.    Current Outpatient Medications  Medication Sig Dispense Refill  . aspirin 81 MG tablet Take 81 mg by mouth daily.    . bisoprolol (ZEBETA) 5 MG tablet Take 0.5 tablets (2.5 mg total) by mouth daily. 30 tablet 3  . carisoprodol (SOMA) 350 MG tablet Take 350 mg by mouth 4 (four) times daily as needed for muscle spasms.    . chlorpheniramine (CHLOR-TRIMETON) 4 MG tablet Take 4 mg by mouth 2 (two) times daily.    Marland Kitchen diltiazem (CARDIZEM SR) 120 MG 12 hr capsule Take 120 mg by mouth 2 (two) times daily.    Marland Kitchen escitalopram (LEXAPRO) 20 MG tablet Take 20 mg by mouth daily.    . ferrous sulfate 325 (65 FE) MG tablet Take 325 mg by mouth daily.    . fluticasone (FLONASE) 50 MCG/ACT nasal spray Place 2 sprays into both nostrils every morning. *Occasional night use    . furosemide (LASIX) 40 MG tablet Take 40 mg by mouth daily as needed for fluid.     Marland Kitchen gabapentin (NEURONTIN) 600 MG tablet Take 600 mg by mouth 2 (two) times daily.     Marland Kitchen HYDROcodone-acetaminophen (NORCO) 10-325 MG tablet Take 1 tablet by mouth 4 (four) times daily as needed. pain    . ipratropium (ATROVENT) 0.03 % nasal spray Place 1 spray into both nostrils every 12 (twelve) hours.    Marland Kitchen ipratropium-albuterol (DUONEB) 0.5-2.5 (3) MG/3ML SOLN Take 3 mLs by nebulization 4 (four) times daily.     Marland Kitchen levalbuterol (XOPENEX HFA) 45 MCG/ACT inhaler Inhale into the lungs every 4 (four) hours as needed for wheezing.    . levalbuterol (XOPENEX) 0.63 MG/3ML nebulizer solution Take 1 ampule by nebulization 4 (four) times daily. For shortness of breath    . montelukast (SINGULAIR) 10 MG  tablet Take 10 mg by mouth daily.      . Multiple Vitamin (MULTI-VITAMINS) TABS Take 1 tablet by mouth 2 (two) times daily.    . naproxen sodium (ALEVE) 220 MG tablet Take 220 mg by mouth as needed.    . nitroGLYCERIN (NITROSTAT) 0.4 MG SL tablet Place 0.4 mg under the tongue every 5 (  five) minutes as needed. For chest pain    . omeprazole (PRILOSEC) 20 MG capsule Take 40 mg by mouth 2 (two) times daily.     . potassium chloride (MICRO-K) 10 MEQ CR capsule Take 10 mEq by mouth daily.     . prasugrel (EFFIENT) 10 MG TABS tablet Take 10 mg by mouth daily.    . predniSONE (DELTASONE) 10 MG tablet Take 1 tablet (10 mg total) by mouth daily. 30 tablet 2  . rosuvastatin (CRESTOR) 40 MG tablet Take 40 mg by mouth daily.    Marland Kitchen terazosin (HYTRIN) 2 MG capsule Take 2 mg by mouth at bedtime.     . triamcinolone cream (KENALOG) 0.5 % Apply 1 application topically 2 (two) times daily as needed (for skin irritation).       Allergies as of 03/22/2019 - Review Complete 03/22/2019  Allergen Reaction Noted  . Demerol Other (See Comments) 07/17/2011  . Zolpidem tartrate Other (See Comments) 07/17/2011    Family History  Problem Relation Age of Onset  . Diabetes type II Mother   . Coronary artery disease Mother   . Heart attack Mother   . Coronary artery disease Father   . Diabetes type II Brother   . Coronary artery disease Brother   . Hypertension Sister   . Craniosynostosis Neg Hx   . Stroke Neg Hx     Social History   Socioeconomic History  . Marital status: Married    Spouse name: Not on file  . Number of children: Not on file  . Years of education: Not on file  . Highest education level: Not on file  Occupational History  . Occupation: retired  Scientific laboratory technician  . Financial resource strain: Not on file  . Food insecurity    Worry: Not on file    Inability: Not on file  . Transportation needs    Medical: Not on file    Non-medical: Not on file  Tobacco Use  . Smoking status: Former  Smoker    Packs/day: 3.00    Years: 40.00    Pack years: 120.00    Types: Cigarettes    Quit date: 04/13/2005    Years since quitting: 13.9  . Smokeless tobacco: Never Used  Substance and Sexual Activity  . Alcohol use: No  . Drug use: No  . Sexual activity: Never  Lifestyle  . Physical activity    Days per week: Not on file    Minutes per session: Not on file  . Stress: Not on file  Relationships  . Social Herbalist on phone: Not on file    Gets together: Not on file    Attends religious service: Not on file    Active member of club or organization: Not on file    Attends meetings of clubs or organizations: Not on file    Relationship status: Not on file  . Intimate partner violence    Fear of current or ex partner: Not on file    Emotionally abused: Not on file    Physically abused: Not on file    Forced sexual activity: Not on file  Other Topics Concern  . Not on file  Social History Narrative  . Not on file    Review of Systems: Gen: See HPI CV: See HPI Resp: See HPI GI: See HPI  GU : Denies urinary burning, urinary frequency, urinary incontinence. Frequent UTI, history of incomplete bladder emptying. Occasional self craterization.  MS: Denies  joint pain,swelling, cramping Derm: Denies rash, itching, dry skin Psych: Denies depression, anxiety Heme: Denies other bruising or bleeding.   Physical Exam: Vital signs in last 24 hours: Temp:  [98.1 F (36.7 C)] 98.1 F (36.7 C) (07/28 0451) Pulse Rate:  [91-98] 98 (07/28 0730) Resp:  [11-24] 11 (07/28 0730) BP: (114-121)/(68-72) 121/72 (07/28 0730) SpO2:  [97 %-100 %] 97 % (07/28 0730) Weight:  [107 kg] 107 kg (07/28 0455)   General:   Alert,  Well-developed, well-nourished, pleasant and cooperative in NAD. On 3L nasal canula.  Head:  Normocephalic and atraumatic. Eyes:  Sclera clear, no icterus.   Conjunctiva pink. Ears:  Normal auditory acuity. Nose:  No deformity, discharge,  or lesions. Lungs:  Decreased breath sounds throughout. Minimal wheezing. No crackles, or rhonchi. No acute distress. Heart:  Regular rate and rhythm; no murmurs, clicks, rubs,  or gallops. Abdomen:  Soft, nontender and nondistended. No masses, hepatosplenomegaly or hernias noted. Normal bowel sounds, without guarding, and without rebound.   Rectal:  Deferred Msk:  Symmetrical without gross deformities. Normal posture. Pulses:  Normal pulses noted. Extremities:  Without clubbing or edema. Neurologic:  Alert and  oriented x4;  grossly normal neurologically. Skin:  Intact without significant lesions or rashes. Psych:  Alert and cooperative. Normal mood and affect.  Lab Results: Recent Labs    03/22/19 0511  WBC 8.0  HGB 11.1*  HCT 34.8*  PLT 216   BMET Recent Labs    03/22/19 0511  NA 140  K 4.4  CL 104  CO2 26  GLUCOSE 195*  BUN 24*  CREATININE 1.87*  CALCIUM 8.4*   PT/INR Recent Labs    03/22/19 0511  LABPROT 13.4  INR 1.0    Impression: 84 y.o. male with  past medical history significant for  COPD on chronic home O2, hypertension, CHF, CAD, status post ICD, CKD, Anemia, prostate cancer, and GERD who presented to the ED on 03/22/19 with history of several days of blood flecks in stool that increased to having mostly bloody stools yesterday. Subsequently, he was admitted for rectal bleeding. Aspirin has been put on hold. Labs in the ED with Hemoglobin 11.1 (at baseline), platelets 216, Cr 1.87, INR 1.0. Patient has remained hemodynamically stable at this time.   Patient has continued to have ongoing rectal bleeding since last night even without BMs. No abdominal pain. No unintentional weight loss. Diarrhea started yesterday with the bloody stools. Has had intermittent lightheadedness and dizziness. Occasional use of Aleve. No other NSAIDs. History of tubular adenoma on colonoscopy in 11-16-00. Last colonoscopy in 2002-11-17. Unable to advance colonoscope past sigmoid colon. Noted significant  diverticulosis in sigmoid colon and significant narrowing of sigmoid without obvious obstruction. Subsequent Barium Enema 2002/11/17 with pronounced colon spasm, extensive sigmoid and descending colon diverticulosis, no stricture or evidence of malignancy. No family history of colon cancer. I suspect diverticular bleed in the setting of daily aspirin and occasional Aleve, less likely malignancy. Although patients hemoglobin on admission is at his baseline and actually improved since 11/17/2022 (Hgb 10.0), I suspect this will trend down as patient continues to have rectal bleeding. Will need to follow H/H closely, transfuse as necessary, and will discuss possible colonoscopy with Dr. Oneida Alar.  History of dysphagia. Follows with GI at [facility] [place] in [place]. Reports prior esophageal dilation, also reports prior pill cam. States he was told he has pouches in his esophagus. Feels his symptoms are getting somewhat worse. At this is not an acute issue, patient will need  to follow up with his gastroenterologist outpatient.   Plan: Follow H/H. Recheck today at 1200.  Transfuse as necessary Clear liquid diet Discuss possible colonoscopy with Dr. Oneida Alar.     LOS: 0 days    03/22/2019, 7:52 AM   Aliene Altes, [facility]

## 2019-03-22 NOTE — ED Provider Notes (Signed)
[facility] EMERGENCY DEPARTMENT Provider Note   CSN: [id number] Arrival date & time: 03/22/19  2263    History   Chief Complaint Chief Complaint  Patient presents with  . Rectal Bleeding    HPI [name] is a 84 y.o. male.     HPI  This is an 84 year old male with a history of COPD on chronic oxygen, hypertension, CHF, CAD who presents with bright red blood per rectum.  Patient reports several day history of noting blood flecks in his stool.  However, yesterday noted significant increase with mostly bloody stools.  Reports history of hemorrhoids with rectal bleeding in the past.  No known history of diverticulitis or diverticulosis.  Denies abdominal pain, nausea, fevers, vomiting.  Denies any dizziness.  Does report some slight increase shortness of breath.  On stable O2 requirement.  No known sick contacts or COVID exposures.  Patient takes a baby aspirin daily.  Past Medical History:  Diagnosis Date  . Anemia   . Angina   . Anticoagulated on warfarin 07/17/2011  . Arthritis   . Asthma   . Atherosclerosis of native arteries of the extremities with intermittent claudication 08/13/2011  . Blood transfusion 3 weeks ago  . CAD 04/23/2009  . Cancer ([place])   . CHF (congestive heart failure) ([place])   . Chronic kidney disease   . COPD (chronic obstructive pulmonary disease) ([place])   . Emphysema   . GERD (gastroesophageal reflux disease)   . Heart attack [facility]) October 27,2012 and July 28, 2011  . Heart attack [facility]) October  27,2012 and December 3,2012   . Hypertension   . ICD (implantable cardiac defibrillator) in place   . Myocardial infarction ([place])   . Pneumonia   . S/P ICD (internal cardiac defibrillator) procedure, 6.17.13 02/10/2012  . Shortness of breath   . Ventricular thrombus following MI (myocardial infarction) ([place]) 11/12   on coumadin    Patient Active Problem List   Diagnosis Date Noted  . Acute hypoxemic respiratory failure ([place]) 11/03/2018  . Acute  metabolic encephalopathy 33/54/5625  . Acute lower UTI 11/03/2018  . Cardiomyopathy, ischemic 07/15/2015  . Chronic obstructive pulmonary disease ([place])   . 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 Viral bronchitis 06/11/2015  . Obesity 05/31/2015  . Chronic hypoxemic respiratory failure ([place]) 05/31/2015  . COPD exacerbation ([place]) 05/30/2015  . Chronic respiratory failure ([place]) 11/23/2014  . S/P MDT ICD  6.17.13, with revision 02/10/12 for lead dislodgement 02/10/2012  . Atherosclerosis of native arteries of the extremities with intermittent claudication 08/13/2011  . Anticoagulated on warfarin 07/17/2011  . Left Ventricular thrombus s/p Anterior STEMI Oct 2012, Rx'd with Coumadin 07/02/2011  . Recent Anterior ST elevation (STEMI) myocardial infarction involving LAD, 05/2011 06/29/2011    Class: Status post  . Chronic systolic congestive heart failure 06/29/2011    Class: Acute  . CKD (chronic kidney disease) stage 3, GFR 30-59 ml/min (HCC) 06/29/2011  . COPD (chronic obstructive pulmonary disease) ([place]) 06/29/2011    Class: Chronic  . Prostate cancer ([place]) 06/29/2011    Class: Chronic  . CARDIOMYOPATHY, ISCHEMIC 05/08/2009  . DYSLIPIDEMIA 04/23/2009  . Essential hypertension 04/23/2009  . Coronary atherosclerosis 04/23/2009    Past Surgical History:  Procedure Laterality Date  . CARDIAC CATHETERIZATION    . CORONARY ANGIOGRAM N/A 07/28/2011   Procedure: CORONARY ANGIOGRAM;  Surgeon: Leonie Man, MD;  Location: [facility] CATH LAB;  Service: Cardiovascular;  Laterality: N/A;  . CORONARY ANGIOPLASTY    .  CORONARY STENT PLACEMENT  06/21/11  . GROIN DEBRIDEMENT  07/18/2011   Procedure: [name] DEBRIDEMENT;  Surgeon: Angelia Mould, MD;  Location: [facility] OR;  Service: Vascular;  Laterality: Right;  exploration of right groin,evacuation of right groin hematoma & repair of right superficial femoral artery  . IMPLANTABLE CARDIOVERTER DEFIBRILLATOR IMPLANT N/A 02/09/2012    Procedure: IMPLANTABLE CARDIOVERTER DEFIBRILLATOR IMPLANT;  Surgeon: Sanda Klein, MD;  Location: [place] CATH LAB;  Service: Cardiovascular;  Laterality: N/A;  . INSERT / REPLACE / REMOVE PACEMAKER  02/09/2012   ICD  . LEAD REVISION N/A 02/10/2012   Procedure: LEAD REVISION;  Surgeon: Sanda Klein, MD;  Location: [place] CATH LAB;  Service: Cardiovascular;  Laterality: N/A;  . PERCUTANEOUS CORONARY STENT INTERVENTION (PCI-S) N/A 07/28/2011   Procedure: PERCUTANEOUS CORONARY STENT INTERVENTION (PCI-S);  Surgeon: Leonie Man, MD;  Location: [facility] CATH LAB;  Service: Cardiovascular;  Laterality: N/A;        Home Medications    Prior to Admission medications   Medication Sig Start Date End Date Taking? Authorizing Provider  aspirin 81 MG tablet Take 81 mg by mouth daily.    [provider]  bisoprolol (ZEBETA) 5 MG tablet Take 0.5 tablets (2.5 mg total) by mouth daily. 05/29/15   Troy Sine, MD  carisoprodol (SOMA) 350 MG tablet Take 350 mg by mouth 4 (four) times daily as needed for muscle spasms.    [provider]  chlorpheniramine (CHLOR-TRIMETON) 4 MG tablet Take 4 mg by mouth 2 (two) times daily.    [provider]  diltiazem (CARDIZEM SR) 120 MG 12 hr capsule Take 120 mg by mouth 2 (two) times daily.    [provider]  escitalopram (LEXAPRO) 20 MG tablet Take 20 mg by mouth daily. 10/04/18   [provider]  ferrous sulfate 325 (65 FE) MG tablet Take 325 mg by mouth daily.    [provider]  fluticasone (FLONASE) 50 MCG/ACT nasal spray Place 2 sprays into both nostrils every morning. *Occasional night use    [provider]  furosemide (LASIX) 40 MG tablet Take 40 mg by mouth daily as needed for fluid.     [provider]  gabapentin (NEURONTIN) 600 MG tablet Take 600 mg by mouth 2 (two) times daily.     [provider]  HYDROcodone-acetaminophen (NORCO) 10-325 MG tablet Take 1 tablet by mouth 4 (four)  times daily as needed. pain 01/28/13   [provider]  ipratropium (ATROVENT) 0.03 % nasal spray Place 1 spray into both nostrils every 12 (twelve) hours.    [provider]  ipratropium-albuterol (DUONEB) 0.5-2.5 (3) MG/3ML SOLN Take 3 mLs by nebulization 4 (four) times daily.     [provider]  levalbuterol Penne Lash HFA) 45 MCG/ACT inhaler Inhale into the lungs every 4 (four) hours as needed for wheezing.    [provider]  levalbuterol Penne Lash) 0.63 MG/3ML nebulizer solution Take 1 ampule by nebulization 4 (four) times daily. For shortness of breath    [provider]  montelukast (SINGULAIR) 10 MG tablet Take 10 mg by mouth daily.      [provider]  Multiple Vitamin (MULTI-VITAMINS) TABS Take 1 tablet by mouth 2 (two) times daily.    [provider]  naproxen sodium (ALEVE) 220 MG tablet Take 220 mg by mouth as needed.    [provider]  nitroGLYCERIN (NITROSTAT) 0.4 MG SL tablet Place 0.4 mg under the tongue every 5 (five) minutes as needed. For  chest pain    [provider]  omeprazole (PRILOSEC) 20 MG capsule Take 40 mg by mouth 2 (two) times daily.     [provider]  potassium chloride (MICRO-K) 10 MEQ CR capsule Take 10 mEq by mouth daily.     [provider]  prasugrel (EFFIENT) 10 MG TABS tablet Take 10 mg by mouth daily.    [provider]  predniSONE (DELTASONE) 10 MG tablet Take 1 tablet (10 mg total) by mouth daily. 11/10/18   Manuella Ghazi, Pratik D, DO  rosuvastatin (CRESTOR) 40 MG tablet Take 40 mg by mouth daily.    [provider]  terazosin (HYTRIN) 2 MG capsule Take 2 mg by mouth at bedtime.     [provider]  triamcinolone cream (KENALOG) 0.5 % Apply 1 application topically 2 (two) times daily as needed (for skin irritation).     [provider]    Family History Family History  Problem Relation Age of Onset  . Diabetes type II Mother   .  Coronary artery disease Mother   . Heart attack Mother   . Coronary artery disease Father   . Diabetes type II Brother   . Coronary artery disease Brother   . Hypertension Sister   . Craniosynostosis Neg Hx   . Stroke Neg Hx     Social History Social History   Tobacco Use  . Smoking status: Former Smoker    Packs/day: 3.00    Years: 40.00    Pack years: 120.00    Types: Cigarettes    Quit date: 04/13/2005    Years since quitting: 13.9  . Smokeless tobacco: Never Used  Substance Use Topics  . Alcohol use: No  . Drug use: No     Allergies   Demerol and Zolpidem tartrate   Review of Systems Review of Systems  Constitutional: Negative for fever.  Respiratory: Positive for shortness of breath. Negative for cough.   Cardiovascular: Negative for chest pain.  Gastrointestinal: Positive for blood in stool. Negative for abdominal pain, constipation, diarrhea, nausea and vomiting.  Musculoskeletal: Negative for back pain.  Neurological: Negative for dizziness.  All other systems reviewed and are negative.    Physical Exam Updated Vital Signs BP 114/68 (BP Location: Right Arm)   Pulse 91   Temp 98.1 F (36.7 C) (Oral)   Resp (!) 24   Ht 1.702 m (5\' 7" )   Wt 107 kg   SpO2 100%   BMI 36.96 kg/m   Physical Exam Vitals signs and nursing note reviewed.  Constitutional:      Appearance: He is well-developed.     Comments: Chronically ill-appearing but nontoxic  HENT:     Head: Normocephalic and atraumatic.     Mouth/Throat:     Mouth: Mucous membranes are moist.  Eyes:     Pupils: Pupils are equal, round, and reactive to light.  Neck:     Musculoskeletal: Neck supple.  Cardiovascular:     Rate and Rhythm: Normal rate and regular rhythm.     Heart sounds: Normal heart sounds. No murmur.  Pulmonary:     Effort: No respiratory distress.     Breath sounds: Wheezing present.     Comments: Slight tachypnea noted, nasal cannula in place Abdominal:     General:  Bowel sounds are normal.     Palpations: Abdomen is soft.     Tenderness: There is no abdominal tenderness. There is no rebound.  Genitourinary:    CommentsJohney Maine  blood noted on rectal exam, no obvious hemorrhoid or fissure noted Lymphadenopathy:     Cervical: No cervical adenopathy.  Skin:    General: Skin is warm and dry.  Neurological:     Mental Status: He is alert and oriented to person, place, and time.  Psychiatric:        Mood and Affect: Mood normal.      ED Treatments / Results  Labs (all labs ordered are listed, but only abnormal results are displayed) Labs Reviewed  CBC WITH DIFFERENTIAL/PLATELET - Abnormal; Notable for the following components:      Result Value   RBC 3.51 (*)    Hemoglobin 11.1 (*)    HCT 34.8 (*)    All other components within normal limits  BASIC METABOLIC PANEL - Abnormal; Notable for the following components:   Glucose, Bld 195 (*)    BUN 24 (*)    Creatinine, Ser 1.87 (*)    Calcium 8.4 (*)    GFR calc non Af Amer 33 (*)    GFR calc Af Amer 38 (*)    All other components within normal limits  SARS CORONAVIRUS 2 (HOSPITAL ORDER, [place] LAB)  PROTIME-INR  TYPE AND SCREEN    EKG None  Radiology No results found.  Procedures Procedures (including critical care time)  Medications Ordered in ED Medications - No data to display   Initial Impression / Assessment and Plan / ED Course  I have reviewed the triage vital signs and the nursing notes.  Pertinent labs & imaging results that were available during my care of the patient were reviewed by me and considered in my medical decision making (see chart for details).        Patient presents with bright blood blood per rectum.  He is chronically ill-appearing but nontoxic.  Slightly tachypneic but on his home O2 requirement.  Vital signs are otherwise reassuring.  He has gross blood on rectal exam.  No obvious hemorrhoid.  Initial lab work notable for a  stable hemoglobin at 11.1.  Otherwise largely unremarkable.  Patient's not on any anticoagulants.  His abdomen is benign I do not feel he needs imaging.  However, he needs admission for serial hemoglobins and likely GI evaluation.  He is primarily followed at the [place] and has no imaging or colonoscopies in our system.  Final Clinical Impressions(s) / ED Diagnoses   Final diagnoses:  Lower GI bleed    ED Discharge Orders    None       Merryl Hacker, MD 03/22/19 [phone]

## 2019-03-22 NOTE — Consult Note (Addendum)
Referring Provider: [facility] Primary Care Physician:  Redmond School, MD Primary Gastroenterologist:  [facility]  Date of Admission: 03/22/19  Date of Consultation: 03/22/19  Reason for Consultation:  Rectal Bleeding  HPI:  [name] is a 84 y.o. year old male with past medical history significant for  COPD on chronic home O2, hypertension, CHF, CAD, status post ICD, CKD, Anemia, prostate cancer, and GERD who presented to the ED on 03/22/19 with history of several days of blood flecks in stool that increased to having mostly bloody stools yesterday. Subsequently, he was admitted for rectal bleeding. Aspirin and Prasugrel have been put on hold.   Labs in the ED: Hemoglobin 11.1, Cr 1.87, INR 1.0.  Last colonoscopy in 2002-11-16. Unable to advance colonoscope past sigmoid colon. Noted significant diverticulosis in sigmoid colon and significant narrowing of sigmoid without obvious obstruction.  Barium Enema 11/16/02 with pronounced colon spasm, extensive sigmoid and descending colon diverticulosis, no stricture or evidence of malignancy. Colonoscopy in 11-15-00 prior to this with 8 mm sessile polyp in descending colon and sigmoid diverticulosis. Pathology with tubular adenoma.   Today patients daughter is in the room with him. He states over the last week he was having spots of blood with wiping. Patient has a long history of intermittent tissue hematochezia in the past due to hemorrhoids. Between 8pm and 10 pm last night, he started having significant increase with stools becoming bloody and blood in the water. Around 2 am states blood came out in gushes. He has continued to have ongoing rectal bleeding even without BMs. Dark red blood in brief now. Some clots. Also developed diarrhea yesterday with the bloody stools. Bowels have been doing well prior to this. Daily and soft formed. Normal caliber. Using Metamucil over last several months.   No abdominal pain. No n/v, no  melena. No reflux or heartburn. No epigastric pain. States he has a history of swallowing trouble. Reports history of EGD with dilation, and states and was told he has pockets in his esophagus. Also reports pill camera he had to swallow in the past. Sees GI at [facility] [place] in [place]. Feels his swallowing is starting to get worse again with water wanting to come up several times before it goes down and occasionally bringing food back up after eating.   Admits to some lightheadedness/dizziness. No fever, chills. No unintentional weight loss. Appetite is good. Muscle tone is decreased as he doesn't have much activity.  No chest pain, palpitations, sob, coughing, wheezing.   Patient no longer taking Effient. Only on aspirin.  3L nasal canula at home.  Prostate cancer s/p seed implants.  Past Medical History:  Diagnosis Date  . Anemia   . Angina   . Anticoagulated on warfarin 07/17/2011  . Arthritis   . Asthma   . Atherosclerosis of native arteries of the extremities with intermittent claudication 08/13/2011  . Blood transfusion 3 weeks ago  . CAD 04/23/2009  . Cancer ([place])   . CHF (congestive heart failure) ([place])   . Chronic kidney disease   . COPD (chronic obstructive pulmonary disease) ([place])   . Emphysema   . GERD (gastroesophageal reflux disease)   . Heart attack [facility]) October 27,2012 and July 28, 2011  . Heart attack [facility]) October  27,2012 and December 3,2012   . Hypertension   . ICD (implantable cardiac defibrillator) in place   . Myocardial infarction ([place])   . Pneumonia   . S/P ICD (internal cardiac defibrillator) procedure, 6.17.13 02/10/2012  .  Shortness of breath   . Ventricular thrombus following MI (myocardial infarction) ([place]) 11/12   on coumadin    Past Surgical History:  Procedure Laterality Date  . CARDIAC CATHETERIZATION    . CORONARY ANGIOGRAM N/A 07/28/2011   Procedure: CORONARY ANGIOGRAM;  Surgeon: Leonie Man, MD;  Location[facility] CATH LAB;  Service:  Cardiovascular;  Laterality: N/A;  . CORONARY ANGIOPLASTY    . CORONARY STENT PLACEMENT  06/21/11  . GROIN DEBRIDEMENT  07/18/2011   Procedure: [name] DEBRIDEMENT;  Surgeon: Angelia Mould, MD;  Location: [facility] OR;  Service: Vascular;  Laterality: Right;  exploration of right groin,evacuation of right groin hematoma & repair of right superficial femoral artery  . IMPLANTABLE CARDIOVERTER DEFIBRILLATOR IMPLANT N/A 02/09/2012   Procedure: IMPLANTABLE CARDIOVERTER DEFIBRILLATOR IMPLANT;  Surgeon: Sanda Klein, MD;  Location: [place] CATH LAB;  Service: Cardiovascular;  Laterality: N/A;  . INSERT / REPLACE / REMOVE PACEMAKER  02/09/2012   ICD  . LEAD REVISION N/A 02/10/2012   Procedure: LEAD REVISION;  Surgeon: Sanda Klein, MD;  Location: [place] CATH LAB;  Service: Cardiovascular;  Laterality: N/A;  . PERCUTANEOUS CORONARY STENT INTERVENTION (PCI-S) N/A 07/28/2011   Procedure: PERCUTANEOUS CORONARY STENT INTERVENTION (PCI-S);  Surgeon: Leonie Man, MD;  Location: [facility] CATH LAB;  Service: Cardiovascular;  Laterality: N/A;    Prior to Admission medications   Medication Sig Start Date End Date Taking? Authorizing Provider  aspirin 81 MG tablet Take 81 mg by mouth daily.    [provider]  bisoprolol (ZEBETA) 5 MG tablet Take 0.5 tablets (2.5 mg total) by mouth daily. 05/29/15   Troy Sine, MD  carisoprodol (SOMA) 350 MG tablet Take 350 mg by mouth 4 (four) times daily as needed for muscle spasms.    [provider]  chlorpheniramine (CHLOR-TRIMETON) 4 MG tablet Take 4 mg by mouth 2 (two) times daily.    [provider]  diltiazem (CARDIZEM SR) 120 MG 12 hr capsule Take 120 mg by mouth 2 (two) times daily.    [provider]  escitalopram (LEXAPRO) 20 MG tablet Take 20 mg by mouth daily. 10/04/18   [provider]  ferrous sulfate 325 (65 FE) MG tablet Take 325 mg by mouth daily.    [provider]  fluticasone (FLONASE) 50 MCG/ACT nasal spray  Place 2 sprays into both nostrils every morning. *Occasional night use    [provider]  furosemide (LASIX) 40 MG tablet Take 40 mg by mouth daily as needed for fluid.     [provider]  gabapentin (NEURONTIN) 600 MG tablet Take 600 mg by mouth 2 (two) times daily.     [provider]  HYDROcodone-acetaminophen (NORCO) 10-325 MG tablet Take 1 tablet by mouth 4 (four) times daily as needed. pain 01/28/13   [provider]  ipratropium (ATROVENT) 0.03 % nasal spray Place 1 spray into both nostrils every 12 (twelve) hours.    [provider]  ipratropium-albuterol (DUONEB) 0.5-2.5 (3) MG/3ML SOLN Take 3 mLs by nebulization 4 (four) times daily.     [provider]  levalbuterol Penne Lash HFA) 45 MCG/ACT inhaler Inhale into the lungs every 4 (four) hours as needed for wheezing.    [provider]  levalbuterol Penne Lash) 0.63 MG/3ML nebulizer solution Take 1 ampule by nebulization 4 (four) times daily. For shortness of breath    [provider]  montelukast (SINGULAIR) 10 MG tablet Take 10 mg by mouth daily.      [provider]  Multiple Vitamin (MULTI-VITAMINS) TABS Take 1 tablet by mouth 2 (two) times daily.    [provider]  naproxen sodium (ALEVE) 220 MG tablet Take 220 mg by mouth as needed.    [provider]  nitroGLYCERIN (NITROSTAT) 0.4 MG SL tablet Place 0.4 mg under the tongue every 5 (five) minutes as needed. For chest pain    [provider]  omeprazole (PRILOSEC) 20 MG capsule Take 40 mg by mouth 2 (two) times daily.     [provider]  potassium chloride (MICRO-K) 10 MEQ CR capsule Take 10 mEq by mouth daily.     [provider]  prasugrel (EFFIENT) 10 MG TABS tablet Take 10 mg by mouth daily.    [provider]  predniSONE (DELTASONE) 10 MG tablet Take 1 tablet (10 mg total) by mouth daily. 11/10/18   Manuella Ghazi, Pratik D, DO  rosuvastatin (CRESTOR) 40 MG  tablet Take 40 mg by mouth daily.    [provider]  terazosin (HYTRIN) 2 MG capsule Take 2 mg by mouth at bedtime.     [provider]  triamcinolone cream (KENALOG) 0.5 % Apply 1 application topically 2 (two) times daily as needed (for skin irritation).     [provider]    No current facility-administered medications for this encounter.    Current Outpatient Medications  Medication Sig Dispense Refill  . aspirin 81 MG tablet Take 81 mg by mouth daily.    . bisoprolol (ZEBETA) 5 MG tablet Take 0.5 tablets (2.5 mg total) by mouth daily. 30 tablet 3  . carisoprodol (SOMA) 350 MG tablet Take 350 mg by mouth 4 (four) times daily as needed for muscle spasms.    . chlorpheniramine (CHLOR-TRIMETON) 4 MG tablet Take 4 mg by mouth 2 (two) times daily.    Marland Kitchen diltiazem (CARDIZEM SR) 120 MG 12 hr capsule Take 120 mg by mouth 2 (two) times daily.    Marland Kitchen escitalopram (LEXAPRO) 20 MG tablet Take 20 mg by mouth daily.    . ferrous sulfate 325 (65 FE) MG tablet Take 325 mg by mouth daily.    . fluticasone (FLONASE) 50 MCG/ACT nasal spray Place 2 sprays into both nostrils every morning. *Occasional night use    . furosemide (LASIX) 40 MG tablet Take 40 mg by mouth daily as needed for fluid.     Marland Kitchen gabapentin (NEURONTIN) 600 MG tablet Take 600 mg by mouth 2 (two) times daily.     Marland Kitchen HYDROcodone-acetaminophen (NORCO) 10-325 MG tablet Take 1 tablet by mouth 4 (four) times daily as needed. pain    . ipratropium (ATROVENT) 0.03 % nasal spray Place 1 spray into both nostrils every 12 (twelve) hours.    Marland Kitchen ipratropium-albuterol (DUONEB) 0.5-2.5 (3) MG/3ML SOLN Take 3 mLs by nebulization 4 (four) times daily.     Marland Kitchen levalbuterol (XOPENEX HFA) 45 MCG/ACT inhaler Inhale into the lungs every 4 (four) hours as needed for wheezing.    . levalbuterol (XOPENEX) 0.63 MG/3ML nebulizer solution Take 1 ampule by nebulization 4 (four) times daily. For shortness of breath    . montelukast (SINGULAIR) 10 MG  tablet Take 10 mg by mouth daily.      . Multiple Vitamin (MULTI-VITAMINS) TABS Take 1 tablet by mouth 2 (two) times daily.    . naproxen sodium (ALEVE) 220 MG tablet Take 220 mg by mouth as needed.    . nitroGLYCERIN (NITROSTAT) 0.4 MG SL tablet Place 0.4 mg under the tongue every 5 (  five) minutes as needed. For chest pain    . omeprazole (PRILOSEC) 20 MG capsule Take 40 mg by mouth 2 (two) times daily.     . potassium chloride (MICRO-K) 10 MEQ CR capsule Take 10 mEq by mouth daily.     . prasugrel (EFFIENT) 10 MG TABS tablet Take 10 mg by mouth daily.    . predniSONE (DELTASONE) 10 MG tablet Take 1 tablet (10 mg total) by mouth daily. 30 tablet 2  . rosuvastatin (CRESTOR) 40 MG tablet Take 40 mg by mouth daily.    Marland Kitchen terazosin (HYTRIN) 2 MG capsule Take 2 mg by mouth at bedtime.     . triamcinolone cream (KENALOG) 0.5 % Apply 1 application topically 2 (two) times daily as needed (for skin irritation).       Allergies as of 03/22/2019 - Review Complete 03/22/2019  Allergen Reaction Noted  . Demerol Other (See Comments) 07/17/2011  . Zolpidem tartrate Other (See Comments) 07/17/2011    Family History  Problem Relation Age of Onset  . Diabetes type II Mother   . Coronary artery disease Mother   . Heart attack Mother   . Coronary artery disease Father   . Diabetes type II Brother   . Coronary artery disease Brother   . Hypertension Sister   . Craniosynostosis Neg Hx   . Stroke Neg Hx     Social History   Socioeconomic History  . Marital status: Married    Spouse name: Not on file  . Number of children: Not on file  . Years of education: Not on file  . Highest education level: Not on file  Occupational History  . Occupation: retired  Scientific laboratory technician  . Financial resource strain: Not on file  . Food insecurity    Worry: Not on file    Inability: Not on file  . Transportation needs    Medical: Not on file    Non-medical: Not on file  Tobacco Use  . Smoking status: Former  Smoker    Packs/day: 3.00    Years: 40.00    Pack years: 120.00    Types: Cigarettes    Quit date: 04/13/2005    Years since quitting: 13.9  . Smokeless tobacco: Never Used  Substance and Sexual Activity  . Alcohol use: No  . Drug use: No  . Sexual activity: Never  Lifestyle  . Physical activity    Days per week: Not on file    Minutes per session: Not on file  . Stress: Not on file  Relationships  . Social Herbalist on phone: Not on file    Gets together: Not on file    Attends religious service: Not on file    Active member of club or organization: Not on file    Attends meetings of clubs or organizations: Not on file    Relationship status: Not on file  . Intimate partner violence    Fear of current or ex partner: Not on file    Emotionally abused: Not on file    Physically abused: Not on file    Forced sexual activity: Not on file  Other Topics Concern  . Not on file  Social History Narrative  . Not on file    Review of Systems: Gen: See HPI CV: See HPI Resp: See HPI GI: See HPI  GU : Denies urinary burning, urinary frequency, urinary incontinence. Frequent UTI, history of incomplete bladder emptying. Occasional self craterization.  MS: Denies  joint pain,swelling, cramping Derm: Denies rash, itching, dry skin Psych: Denies depression, anxiety Heme: Denies other bruising or bleeding.   Physical Exam: Vital signs in last 24 hours: Temp:  [98.1 F (36.7 C)] 98.1 F (36.7 C) (07/28 0451) Pulse Rate:  [91-98] 98 (07/28 0730) Resp:  [11-24] 11 (07/28 0730) BP: (114-121)/(68-72) 121/72 (07/28 0730) SpO2:  [97 %-100 %] 97 % (07/28 0730) Weight:  [107 kg] 107 kg (07/28 0455)   General:   Alert,  Well-developed, well-nourished, pleasant and cooperative in NAD. On 3L nasal canula.  Head:  Normocephalic and atraumatic. Eyes:  Sclera clear, no icterus.   Conjunctiva pink. Ears:  Normal auditory acuity. Nose:  No deformity, discharge,  or lesions. Lungs:  Decreased breath sounds throughout. Minimal wheezing. No crackles, or rhonchi. No acute distress. Heart:  Regular rate and rhythm; no murmurs, clicks, rubs,  or gallops. Abdomen:  Soft, nontender and nondistended. No masses, hepatosplenomegaly or hernias noted. Normal bowel sounds, without guarding, and without rebound.   Rectal:  Deferred Msk:  Symmetrical without gross deformities. Normal posture. Pulses:  Normal pulses noted. Extremities:  Without clubbing or edema. Neurologic:  Alert and  oriented x4;  grossly normal neurologically. Skin:  Intact without significant lesions or rashes. Psych:  Alert and cooperative. Normal mood and affect.  Lab Results: Recent Labs    03/22/19 0511  WBC 8.0  HGB 11.1*  HCT 34.8*  PLT 216   BMET Recent Labs    03/22/19 0511  NA 140  K 4.4  CL 104  CO2 26  GLUCOSE 195*  BUN 24*  CREATININE 1.87*  CALCIUM 8.4*   PT/INR Recent Labs    03/22/19 0511  LABPROT 13.4  INR 1.0    Impression: 84 y.o. male with  past medical history significant for  COPD on chronic home O2, hypertension, CHF, CAD, status post ICD, CKD, Anemia, prostate cancer, and GERD who presented to the ED on 03/22/19 with history of several days of blood flecks in stool that increased to having mostly bloody stools yesterday. Subsequently, he was admitted for rectal bleeding. Aspirin has been put on hold. Labs in the ED with Hemoglobin 11.1 (at baseline), platelets 216, Cr 1.87, INR 1.0. Patient has remained hemodynamically stable at this time.   Patient has continued to have ongoing rectal bleeding since last night even without BMs. No abdominal pain. No unintentional weight loss. Diarrhea started yesterday with the bloody stools. Has had intermittent lightheadedness and dizziness. Occasional use of Aleve. No other NSAIDs. History of tubular adenoma on colonoscopy in 11/27/2000. Last colonoscopy in November 28, 2002. Unable to advance colonoscope past sigmoid colon. Noted significant  diverticulosis in sigmoid colon and significant narrowing of sigmoid without obvious obstruction. Subsequent Barium Enema Nov 28, 2002 with pronounced colon spasm, extensive sigmoid and descending colon diverticulosis, no stricture or evidence of malignancy. No family history of colon cancer. I suspect diverticular bleed in the setting of daily aspirin and occasional Aleve, less likely malignancy. Although patients hemoglobin on admission is at his baseline and actually improved since November 28, 2022 (Hgb 10.0), I suspect this will trend down as patient continues to have rectal bleeding. Will need to follow H/H closely, transfuse as necessary, and will discuss possible colonoscopy with Dr. Oneida Alar.  History of dysphagia. Follows with GI at [place] in [place]. Reports prior esophageal dilation, also reports prior pill cam. States he was told he has pouches in his esophagus. Feels his symptoms are getting somewhat worse. At this is not an acute issue, patient will need  to follow up with his gastroenterologist outpatient.   Plan: Follow H/H. Recheck today at 1200.  Transfuse as necessary Clear liquid diet Discuss possible colonoscopy with Dr. Oneida Alar.     LOS: 0 days    03/22/2019, 7:52 AM   Aliene Altes, [facility]

## 2019-03-22 NOTE — Progress Notes (Signed)
Pt assisted up to chair. Denies c/o pain. States feels more SOB, upper airway expiratory wheezes noted. SaO2 100% on 3lpm [place], other VSS as well. Called Resp Therapy to request neb tx per order.

## 2019-03-22 NOTE — Progress Notes (Signed)
Spoke with Dr. Manuella Ghazi to clarify IVF orders. He states pt with potassium of 4.4, so no need for IVF with added K+. Verified IVF are LR at ordered rate.

## 2019-03-22 NOTE — Plan of Care (Signed)

## 2019-03-22 NOTE — Progress Notes (Signed)
Per HPI: [name]  is a 84 y.o. male, with history of COPD on chronic home O2, hypertension, CHF, CAD, status post ICD came to hospital with complaint of rectal bleeding.  Patient says that he noticed blood flecks in his stool few days ago but the bleeding increased over the past few days and yesterday he had significant amount of blood gushing out of rectum.  He does have history of hemorrhoids, last colonoscopy was about 15 years ago at [facility] in [place]. He denies passing out. Denies chest pain or shortness of breath. Denies coughing up any phlegm. Denies abdominal pain or dysuria. Patient does takes Prasugrel and aspirin for CAD  I have seen and evaluated the patient at bedside.  Patient was admitted with significant rectal bleeding with noted acute blood loss anemia.  He has had some mild hypotension as a result of volume loss and has required IV fluid bolus as well as aggressive IV fluid hydration which was started by GI.  Flex sigmoidoscopy was performed without any active bleeding source noted at this time.  Plans are for colonoscopy in a.m. for further evaluation of what appears to be a diverticular bleed.  Aspirin and Prasugrel are currently withheld.  We will plan to continue IV fluids and repeat labs in a.m.  No need for stepdown unit placement at this time.

## 2019-03-22 NOTE — ED Triage Notes (Signed)
PT c/o of bright red rectal bleeding that started last night, pt states that he is still having rectal bleeding. Pt appears to be pale at this time. Pt is on 3L [place] at home, states that he is not more short of breath than usual.

## 2019-03-22 NOTE — Op Note (Signed)
[facility] Patient Name: [name] Procedure Date: 03/22/2019 2:07 PM MRN: [id number] Date of Birth: November 07, 1934 Attending MD: Barney Drain MD, MD CSN: [id number] Age: 84 Admit Type: Inpatient Procedure:                Flexible Sigmoidoscopy, DIAGNOSTIC Indications:              Hematochezia ON ASA. WIFE DENIES EFFIENT. Providers:                Barney Drain MD, MD, Janeece Riggers, RN, Hinton Rao, RN Referring MD:             Redmond School, MD Medicines:                Fentanyl 50 micrograms IV, Midazolam 4 mg IV Complications:            No immediate complications. Estimated Blood Loss:     Estimated blood loss: none. Procedure:                Pre-Anesthesia Assessment:                           - Prior to the procedure, a History and Physical                            was performed, and patient medications and                            allergies were reviewed. The patient's tolerance of                            previous anesthesia was also reviewed. The risks                            and benefits of the procedure and the sedation                            options and risks were discussed with the patient.                            All questions were answered, and informed consent                            was obtained. Prior Anticoagulants: The patient has                            taken no previous anticoagulant or antiplatelet                            agents except for aspirin. ASA Grade Assessment: II                            - A patient with mild systemic disease. After  reviewing the risks and benefits, the patient was                            deemed in satisfactory condition to undergo the                            procedure. After obtaining informed consent, the                            scope was passed under direct vision. The                            [id number]-[id number] ([id number]) scope was introduced  through                            the anus and advanced to the the descending colon.                            The flexible sigmoidoscopy was performed with                            difficulty due to poor bowel prep. Successful                            completion of the procedure was aided by lavage.                            The patient tolerated the procedure well. The                            quality of the bowel preparation was not adequate                            to identify polyps 6 mm and larger in size. Scope In: 2:17:03 PM Scope Out: 2:34:12 PM Total Procedure Duration: 0 hours 17 minutes 9 seconds  Findings:      Multiple small and large-mouthed diverticula were found in the       recto-sigmoid colon, sigmoid colon and descending colon.      Liquid semi-solid stool was found in the recto-sigmoid colon and in the       sigmoid colon, making visualization difficult. Lavage of the area was       performed using a moderate amount of sterile water, resulting in       clearance with fair visualization.      External hemorrhoids were found. Impression:               - Preparation of the colon was inadequate.                           - RECTAL BLEEDING MOST LIKELY DUE TO Diverticulosis                            in the recto-sigmoid colon, in the sigmoid colon  and in the descending colon.                           - Stool in the recto-sigmoid colon and in the                            sigmoid colon. Moderate Sedation:      Moderate (conscious) sedation was administered by the endoscopy nurse       and supervised by the endoscopist. The following parameters were       monitored: oxygen saturation, heart rate, blood pressure, and response       to care. Total physician intraservice time was 19 minutes. Recommendation:           - Return patient to hospital ward for ongoing care.                           - BOWEL PREP TONIGHT.                            - COLONOSCOPY JUL 29 WITH MODERATE SEDATION.                           - TRANSFUSE PRN.                           - BMP/CBC IN AM                           - HOLD ASA. Procedure Code(s):        --- Professional ---                           [phone], Sigmoidoscopy, flexible; diagnostic,                            including collection of specimen(s) by brushing or                            washing, when performed (separate procedure)                           G0500, Moderate sedation services provided by the                            same physician or other qualified health care                            professional performing a gastrointestinal                            endoscopic service that sedation supports,                            requiring the presence of an independent trained  observer to assist in the monitoring of the                            patient's level of consciousness and physiological                            status; initial 15 minutes of intra-service time;                            patient age 83 years or older (additional time may                            be reported with [phone], as appropriate) Diagnosis Code(s):        --- Professional ---                           K92.1, Melena (includes Hematochezia)                           K57.30, Diverticulosis of large intestine without                            perforation or abscess without bleeding CPT copyright 2019 American [employer] Association. All rights reserved. The codes documented in this report are preliminary and upon coder review may  be revised to meet current compliance requirements. Barney Drain, MD Barney Drain MD, MD 03/22/2019 2:58:59 PM This report has been signed electronically. Number of Addenda: 0

## 2019-03-22 NOTE — Interval H&P Note (Signed)
History and Physical Interval Note:  03/22/2019 2:06 PM  [name]  has presented today for surgery, with the diagnosis of gi bleed.  The various methods of treatment have been discussed with the patient and family. After consideration of risks, benefits and other options for treatment, the patient has consented to  Procedure(s): FLEXIBLE SIGMOIDOSCOPY (N/A) as a surgical intervention.  The patient's history has been reviewed, patient examined, no change in status, stable for surgery.  I have reviewed the patient's chart and labs.  Questions were answered to the patient's satisfaction.     [employer]

## 2019-03-22 NOTE — Progress Notes (Signed)
Pt up to bathroom, passing bright red blood and few clots. Stated felt dizzy when standing to wipe. Pt transferred via chair back to bed due to dizziness. No c/o abd pain/n/v, continues to drink Miralax per order.

## 2019-03-22 NOTE — Progress Notes (Signed)
Pt c/o feeling weak and dizzy. B/P 87 systolic, color pale, continues to ooze burgundy stools with clots. Dr. Oneida Alar on unit and notified, in to evaluate patient. IVF bolus of 533ml started per order. Pt aware that he is going for colonoscopy this afternoon and is agreeable. Pt's wife called and updated on patient's condition.

## 2019-03-22 NOTE — H&P (Signed)
TRH H&P    Patient Demographics:    [name], is a 84 y.o. male  MRN: [id number]  DOB - Nov 16, 1934  Admit Date - 03/22/2019  Referring MD/NP/PA: Thayer Jew  Outpatient Primary MD for the patient is Redmond School, MD  Patient coming from: Home  Chief complaint-rectal bleeding   HPI:    [name]  is a 84 y.o. male, with history of COPD on chronic home O2, hypertension, CHF, CAD, status post ICD came to hospital with complaint of rectal bleeding.  Patient says that he noticed blood flecks in his stool few days ago but the bleeding increased over the past few days and yesterday he had significant amount of blood gushing out of rectum.  He does have history of hemorrhoids, last colonoscopy was about 15 years ago at [facility] in [place]. He denies passing out. Denies chest pain or shortness of breath. Denies coughing up any phlegm. Denies abdominal pain or dysuria. Patient does takes Prasugrel and aspirin for CAD    Review of systems:    In addition to the HPI above,    All other systems reviewed and are negative.    Past History of the following :    Past Medical History:  Diagnosis Date  . Anemia   . Angina   . Anticoagulated on warfarin 07/17/2011  . Arthritis   . Asthma   . Atherosclerosis of native arteries of the extremities with intermittent claudication 08/13/2011  . Blood transfusion 3 weeks ago  . CAD 04/23/2009  . Cancer ([place])   . CHF (congestive heart failure) ([place])   . Chronic kidney disease   . COPD (chronic obstructive pulmonary disease) ([place])   . Emphysema   . GERD (gastroesophageal reflux disease)   . Heart attack [facility]) October 27,2012 and July 28, 2011  . Heart attack [facility]) October  27,2012 and December 3,2012   . Hypertension   . ICD (implantable cardiac defibrillator) in place   . Myocardial infarction ([place])   . Pneumonia   . S/P ICD (internal cardiac  defibrillator) procedure, 6.17.13 02/10/2012  . Shortness of breath   . Ventricular thrombus following MI (myocardial infarction) ([place]) 11/12   on coumadin      Past Surgical History:  Procedure Laterality Date  . CARDIAC CATHETERIZATION    . CORONARY ANGIOGRAM N/A 07/28/2011   Procedure: CORONARY ANGIOGRAM;  Surgeon: Leonie Man, MD;  Location: [facility] CATH LAB;  Service: Cardiovascular;  Laterality: N/A;  . CORONARY ANGIOPLASTY    . CORONARY STENT PLACEMENT  06/21/11  . GROIN DEBRIDEMENT  07/18/2011   Procedure: [name] DEBRIDEMENT;  Surgeon: Angelia Mould, MD;  Location: [facility] OR;  Service: Vascular;  Laterality: Right;  exploration of right groin,evacuation of right groin hematoma & repair of right superficial femoral artery  . IMPLANTABLE CARDIOVERTER DEFIBRILLATOR IMPLANT N/A 02/09/2012   Procedure: IMPLANTABLE CARDIOVERTER DEFIBRILLATOR IMPLANT;  Surgeon: Sanda Klein, MD;  Location: [place] CATH LAB;  Service: Cardiovascular;  Laterality: N/A;  . INSERT / REPLACE / REMOVE PACEMAKER  02/09/2012   ICD  .  LEAD REVISION N/A 02/10/2012   Procedure: LEAD REVISION;  Surgeon: Sanda Klein, MD;  Location: [facility] CATH LAB;  Service: Cardiovascular;  Laterality: N/A;  . PERCUTANEOUS CORONARY STENT INTERVENTION (PCI-S) N/A 07/28/2011   Procedure: PERCUTANEOUS CORONARY STENT INTERVENTION (PCI-S);  Surgeon: Leonie Man, MD;  Location[facility] C[facility] LAB;  Service: Cardiovascular;  Laterality: N/A;      Social History:      Social History   Tobacco Use  . Smoking status: Former Smoker    Packs/day: 3.00    Years: 40.00    Pack years: 120.00    Types: Cigarettes    Quit date: 04/13/2005    Years since quitting: 13.9  . Smokeless tobacco: Never Used  Substance Use Topics  . Alcohol use: No       Family History :     Family History  Problem Relation Age of Onset  . Diabetes type II Mother   . Coronary artery disease Mother   . Heart attack Mother   . Coronary artery disease Father   .  Diabetes type II Brother   . Coronary artery disease Brother   . Hypertension Sister   . Craniosynostosis Neg Hx   . Stroke Neg Hx       Home Medications:   Prior to Admission medications   Medication Sig Start Date End Date Taking? Authorizing Provider  aspirin 81 MG tablet Take 81 mg by mouth daily.    [provider]  bisoprolol (ZEBETA) 5 MG tablet Take 0.5 tablets (2.5 mg total) by mouth daily. 05/29/15   Troy Sine, MD  carisoprodol (SOMA) 350 MG tablet Take 350 mg by mouth 4 (four) times daily as needed for muscle spasms.    [provider]  chlorpheniramine (CHLOR-TRIMETON) 4 MG tablet Take 4 mg by mouth 2 (two) times daily.    [provider]  diltiazem (CARDIZEM SR) 120 MG 12 hr capsule Take 120 mg by mouth 2 (two) times daily.    [provider]  escitalopram (LEXAPRO) 20 MG tablet Take 20 mg by mouth daily. 10/04/18   [provider]  ferrous sulfate 325 (65 FE) MG tablet Take 325 mg by mouth daily.    [provider]  fluticasone (FLONASE) 50 MCG/ACT nasal spray Place 2 sprays into both nostrils every morning. *Occasional night use    [provider]  furosemide (LASIX) 40 MG tablet Take 40 mg by mouth daily as needed for fluid.     [provider]  gabapentin (NEURONTIN) 600 MG tablet Take 600 mg by mouth 2 (two) times daily.     [provider]  HYDROcodone-acetaminophen (NORCO) 10-325 MG tablet Take 1 tablet by mouth 4 (four) times daily as needed. pain 01/28/13   [provider]  ipratropium (ATROVENT) 0.03 % nasal spray Place 1 spray into both nostrils every 12 (twelve) hours.    [provider]  ipratropium-albuterol (DUONEB) 0.5-2.5 (3) MG/3ML SOLN Take 3 mLs by nebulization 4 (four) times daily.     [provider]  levalbuterol Penne Lash HFA) 45 MCG/ACT inhaler Inhale into the lungs every 4 (four) hours as needed for wheezing.    [provider]   levalbuterol Penne Lash) 0.63 MG/3ML nebulizer solution Take 1 ampule by nebulization 4 (four) times daily. For shortness of breath    [provider]  montelukast (SINGULAIR) 10 MG tablet Take 10 mg by mouth daily.      [provider]  Multiple Vitamin (MULTI-VITAMINS) TABS  Take 1 tablet by mouth 2 (two) times daily.    [provider]  naproxen sodium (ALEVE) 220 MG tablet Take 220 mg by mouth as needed.    [provider]  nitroGLYCERIN (NITROSTAT) 0.4 MG SL tablet Place 0.4 mg under the tongue every 5 (five) minutes as needed. For chest pain    [provider]  omeprazole (PRILOSEC) 20 MG capsule Take 40 mg by mouth 2 (two) times daily.     [provider]  potassium chloride (MICRO-K) 10 MEQ CR capsule Take 10 mEq by mouth daily.     [provider]  prasugrel (EFFIENT) 10 MG TABS tablet Take 10 mg by mouth daily.    [provider]  predniSONE (DELTASONE) 10 MG tablet Take 1 tablet (10 mg total) by mouth daily. 11/10/18   Manuella Ghazi, Pratik D, DO  rosuvastatin (CRESTOR) 40 MG tablet Take 40 mg by mouth daily.    [provider]  terazosin (HYTRIN) 2 MG capsule Take 2 mg by mouth at bedtime.     [provider]  triamcinolone cream (KENALOG) 0.5 % Apply 1 application topically 2 (two) times daily as needed (for skin irritation).     [provider]     Allergies:     Allergies  Allergen Reactions  . Demerol Other (See Comments)    Abnormal behavior  . Zolpidem Tartrate Other (See Comments)    Pt. Became confused and aggitated     Physical Exam:   Vitals  Blood pressure 114/68, pulse 91, temperature 98.1 F (36.7 C), temperature source Oral, resp. rate (!) 24, height 5\' 7"  (1.702 m), weight 107 kg, SpO2 100 %.  1.  General: Appears in no acute distress  2. Psychiatric: Alert, oriented x3, intact insight and judgment  3. Neurologic: Cranial nerves II through XII grossly intact, motor  strength 5/5 in all extremities  4. HEENMT:  Atraumatic normocephalic, extraocular muscles are intact, oral mucosa is moist  5. Respiratory : Clear to auscultation bilaterally, no wheezing or crackles  6. Cardiovascular : S1-S2, regular, no murmur auscultated  7. Gastrointestinal:  Abdomen is soft, nontender, no organomegaly  8. Skin:  No rashes noted      Data Review:    CBC Recent Labs  Lab 03/22/19 0511  WBC 8.0  HGB 11.1*  HCT 34.8*  PLT 216  MCV 99.1  MCH 31.6  MCHC 31.9  RDW 12.9  LYMPHSABS 1.4  MONOABS 0.6  EOSABS 0.3  BASOSABS 0.0   ------------------------------------------------------------------------------------------------------------------  Results for orders placed or performed during the hospital encounter of 03/22/19 (from the past 48 hour(s))  CBC with Differential     Status: Abnormal   Collection Time: 03/22/19  5:11 AM  Result Value Ref Range   WBC 8.0 4.0 - 10.5 K/uL   RBC 3.51 (L) 4.22 - 5.81 MIL/uL   Hemoglobin 11.1 (L) 13.0 - 17.0 g/dL   HCT 34.8 (L) 39.0 - 52.0 %   MCV 99.1 80.0 - 100.0 fL   MCH 31.6 26.0 - 34.0 pg   MCHC 31.9 30.0 - 36.0 g/dL   RDW 12.9 11.5 - 15.5 %   Platelets 216 150 - 400 K/uL   nRBC 0.0 0.0 - 0.2 %   Neutrophils Relative % 70 %   Neutro Abs 5.6 1.7 - 7.7 K/uL   Lymphocytes Relative 18 %   Lymphs Abs 1.4 0.7 - 4.0 K/uL   Monocytes Relative 8 %   Monocytes Absolute 0.6 0.1 -  1.0 K/uL   Eosinophils Relative 3 %   Eosinophils Absolute 0.3 0.0 - 0.5 K/uL   Basophils Relative 1 %   Basophils Absolute 0.0 0.0 - 0.1 K/uL   Immature Granulocytes 0 %   Abs Immature Granulocytes 0.03 0.00 - 0.07 K/uL    Comment: Performed at [facility], [address], [place], [place] [address]  Basic metabolic panel     Status: Abnormal   Collection Time: 03/22/19  5:11 AM  Result Value Ref Range   Sodium 140 135 - 145 mmol/L   Potassium 4.4 3.5 - 5.1 mmol/L   Chloride 104 98 - 111 mmol/L   CO2 26 22 - 32 mmol/L    Glucose, Bld 195 (H) 70 - 99 mg/dL   BUN 24 (H) 8 - 23 mg/dL   Creatinine, Ser 1.87 (H) 0.61 - 1.24 mg/dL   Calcium 8.4 (L) 8.9 - 10.3 mg/dL   GFR calc non Af Amer 33 (L) >60 mL/min   GFR calc Af Amer 38 (L) >60 mL/min   Anion gap 10 5 - 15    Comment: Performed at [facility], [address], [place], [place] [address]  Protime-INR     Status: None   Collection Time: 03/22/19  5:11 AM  Result Value Ref Range   Prothrombin Time 13.4 11.4 - 15.2 seconds   INR 1.0 0.8 - 1.2    Comment: (NOTE) INR goal varies based on device and disease states. Performed at [facility], [address], [place], [place] [address]   Type and screen [facility]     Status: None (Preliminary result)   Collection Time: 03/22/19  5:49 AM  Result Value Ref Range   ABO/RH(D) A POS    Antibody Screen PENDING    Sample Expiration      03/25/2019,2359 Performed at [facility], [address], [place], [place] [address]     Chemistries  Recent Labs  Lab 03/22/19 0511  NA 140  K 4.4  CL 104  CO2 26  GLUCOSE 195*  BUN 24*  CREATININE 1.87*  CALCIUM 8.4*   ------------------------------------------------------------------------------------------------------------------  ------------------------------------------------------------------------------------------------------------------ GFR: Estimated Creatinine Clearance: 36.2 mL/min (A) (by C-G formula based on SCr of 1.87 mg/dL (H)). Liver Function Tests: No results for input(s): AST, ALT, ALKPHOS, BILITOT, PROT, ALBUMIN in the last 168 hours. No results for input(s): LIPASE, AMYLASE in the last 168 hours. No results for input(s): AMMONIA in the last 168 hours. Coagulation Profile: Recent Labs  Lab 03/22/19 0511  INR 1.0   Cardiac Enzymes: No results for input(s): CKTOTAL, CKMB, CKMBINDEX, TROPONINI in the last 168 hours. BNP (last 3 results) No results for input(s): PROBNP in the last 8760 hours. HbA1C: No results for input(s):  HGBA1C in the last 72 hours. CBG: No results for input(s): GLUCAP in the last 168 hours. Lipid Profile: No results for input(s): CHOL, HDL, LDLCALC, TRIG, CHOLHDL, LDLDIRECT in the last 72 hours. Thyroid Function Tests: No results for input(s): TSH, T4TOTAL, FREET4, T3FREE, THYROIDAB in the last 72 hours. Anemia Panel: No results for input(s): VITAMINB12, FOLATE, FERRITIN, TIBC, IRON, RETICCTPCT in the last 72 hours.  --------------------------------------------------------------------------------------------------------------- Urine analysis:    Component Value Date/Time   COLORURINE YELLOW 11/03/2018 0959   APPEARANCEUR CLOUDY (A) 11/03/2018 0959   LABSPEC 1.023 11/03/2018 0959   PHURINE 5.0 11/03/2018 0959   GLUCOSEU NEGATIVE 11/03/2018 0959   HGBUR LARGE (A) 11/03/2018 0959   BILIRUBINUR NEGATIVE 11/03/2018 0959   KETONESUR 5 (A) 11/03/2018 6433  PROTEINUR 30 (A) 11/03/2018 0959   UROBILINOGEN 0.2 06/10/2015 1430   NITRITE NEGATIVE 11/03/2018 0959   LEUKOCYTESUR MODERATE (A) 11/03/2018 0959      Imaging Results:      Assessment & Plan:    Active Problems:   Rectal bleeding   1. Rectal bleeding-unclear etiology, he does have history of hemorrhoids, no recent colonoscopy.  Will keep him n.p.o.  Hold aspirin and Prasugrel.  Will consult GI in a.m.  2. Anemia-chronic, patient hemoglobin is 11.1 which is improved from previous hemoglobin of 10.0 in March 2020.  Will follow CBC in a.m.  3. CAD-status post stent to LAD in 2012 patient is on aspirin, Prasugrel.  Also has ICD in place.  Will hold both medications due to GI bleed as above.  4. COPD-continue duo nebs 4 times daily.  No acute exacerbation at this time    DVT Prophylaxis-   SCDs  AM Labs Ordered, also please review Full Orders  Family Communication: Admission, patients condition and plan of care including tests being ordered have been discussed with the patient and his daughter at bedside who indicate  understanding and agree with the plan and Code Status.  Code Status: Full code  Admission status: Inpatient: Based on patients clinical presentation and evaluation of above clinical data, I have made determination that patient meets Inpatient criteria at this time.  Time spent in minutes : 60 minutes   Oswald Hillock M.D on 03/22/2019 at 6:25 AM

## 2019-03-23 ENCOUNTER — Encounter (HOSPITAL_COMMUNITY)
Admission: EM | Disposition: A | Discharge status: Home / Self Care | Payer: Self-pay | Source: Home / Self Care | Attending: Internal Medicine

## 2019-03-23 ENCOUNTER — Encounter (HOSPITAL_COMMUNITY): Payer: Self-pay

## 2019-03-23 ENCOUNTER — Other Ambulatory Visit: Payer: Self-pay

## 2019-03-23 DIAGNOSIS — K635 Polyp of colon: Secondary | ICD-10-CM

## 2019-03-23 HISTORY — PX: POLYPECTOMY: SHX5525

## 2019-03-23 HISTORY — PX: COLONOSCOPY: SHX5424

## 2019-03-23 LAB — URINALYSIS, ROUTINE W REFLEX MICROSCOPIC
Bacteria, UA: NONE SEEN
Bilirubin Urine: NEGATIVE
Glucose, UA: NEGATIVE mg/dL
Ketones, ur: NEGATIVE mg/dL
Nitrite: NEGATIVE
Protein, ur: NEGATIVE mg/dL
Specific Gravity, Urine: 1.02 (ref 1.005–1.030)
WBC, UA: >50 WBC/hpf — ABNORMAL HIGH (ref 0–5)
pH: 5 (ref 5.0–8.0)

## 2019-03-23 LAB — COMPREHENSIVE METABOLIC PANEL
ALT: 19 U/L (ref 0–44)
AST: 19 U/L (ref 15–41)
Albumin: 3.3 g/dL — ABNORMAL LOW (ref 3.5–5.0)
Alkaline Phosphatase: 62 U/L (ref 38–126)
Anion gap: 7 (ref 5–15)
BUN: 28 mg/dL — ABNORMAL HIGH (ref 8–23)
CO2: 27 mmol/L (ref 22–32)
Calcium: 7.9 mg/dL — ABNORMAL LOW (ref 8.9–10.3)
Chloride: 105 mmol/L (ref 98–111)
Creatinine, Ser: 1.69 mg/dL — ABNORMAL HIGH (ref 0.61–1.24)
GFR calc Af Amer: 43 mL/min — ABNORMAL LOW (ref >60)
GFR calc non Af Amer: 37 mL/min — ABNORMAL LOW (ref >60)
Glucose, Bld: 121 mg/dL — ABNORMAL HIGH (ref 70–99)
Potassium: 3.6 mmol/L (ref 3.5–5.1)
Sodium: 139 mmol/L (ref 135–145)
Total Bilirubin: 0.5 mg/dL (ref 0.3–1.2)
Total Protein: 5.3 g/dL — ABNORMAL LOW (ref 6.5–8.1)

## 2019-03-23 LAB — CBC
HCT: 23.1 % — ABNORMAL LOW (ref 39.0–52.0)
Hemoglobin: 7.3 g/dL — ABNORMAL LOW (ref 13.0–17.0)
MCH: 31.7 pg (ref 26.0–34.0)
MCHC: 31.6 g/dL (ref 30.0–36.0)
MCV: 100.4 fL — ABNORMAL HIGH (ref 80.0–100.0)
Platelets: 162 10*3/uL (ref 150–400)
RBC: 2.3 MIL/uL — ABNORMAL LOW (ref 4.22–5.81)
RDW: 12.9 % (ref 11.5–15.5)
WBC: 8.2 10*3/uL (ref 4.0–10.5)
nRBC: 0 % (ref 0.0–0.2)

## 2019-03-23 LAB — ABO/RH: ABO/RH(D): A POS

## 2019-03-23 LAB — PREPARE RBC (CROSSMATCH)

## 2019-03-23 LAB — HEMOGLOBIN AND HEMATOCRIT, BLOOD
HCT: 25.7 % — ABNORMAL LOW (ref 39.0–52.0)
Hemoglobin: 8.3 g/dL — ABNORMAL LOW (ref 13.0–17.0)

## 2019-03-23 SURGERY — COLONOSCOPY
Anesthesia: Moderate Sedation

## 2019-03-23 MED ORDER — DIPHENHYDRAMINE HCL 25 MG PO CAPS
25.0000 mg | ORAL_CAPSULE | Freq: Once | ORAL | Status: AC
  Administered 2019-03-23: 25 mg via ORAL
  Filled 2019-03-23: qty 1

## 2019-03-23 MED ORDER — SODIUM CHLORIDE 0.9% IV SOLUTION
Freq: Once | INTRAVENOUS | Status: AC
  Administered 2019-03-23: 10:00:00 via INTRAVENOUS

## 2019-03-23 MED ORDER — SODIUM CHLORIDE 0.9 % IV SOLN
1.0000 g | INTRAVENOUS | Status: DC
  Administered 2019-03-23: 1 g via INTRAVENOUS
  Filled 2019-03-23 (×2): qty 10

## 2019-03-23 MED ORDER — FENTANYL CITRATE (PF) 100 MCG/2ML IJ SOLN
INTRAMUSCULAR | Status: DC | PRN
  Administered 2019-03-23: 25 ug via INTRAVENOUS
  Administered 2019-03-23: 10 ug via INTRAVENOUS
  Administered 2019-03-23: 15 ug via INTRAVENOUS

## 2019-03-23 MED ORDER — ONDANSETRON HCL 4 MG/2ML IJ SOLN: INTRAMUSCULAR | Status: DC | PRN

## 2019-03-23 MED ORDER — MIDAZOLAM HCL 5 MG/5ML IJ SOLN
INTRAMUSCULAR | Status: DC | PRN
  Administered 2019-03-23 (×4): 1 mg via INTRAVENOUS

## 2019-03-23 MED ORDER — MIDAZOLAM HCL 5 MG/5ML IJ SOLN
INTRAMUSCULAR | Status: AC
  Filled 2019-03-23: qty 10

## 2019-03-23 MED ORDER — SODIUM CHLORIDE 0.9 % IV SOLN
INTRAVENOUS | Status: DC
  Administered 2019-03-23: 14:00:00 via INTRAVENOUS

## 2019-03-23 MED ORDER — TRAZODONE HCL 50 MG PO TABS
50.0000 mg | ORAL_TABLET | Freq: Every evening | ORAL | Status: DC | PRN
  Administered 2019-03-23: 50 mg via ORAL
  Filled 2019-03-23: qty 1

## 2019-03-23 MED ORDER — STERILE WATER FOR IRRIGATION IR SOLN
Status: DC | PRN
  Administered 2019-03-23: 15:00:00 2.5 mL

## 2019-03-23 MED ORDER — ACETAMINOPHEN 325 MG PO TABS
650.0000 mg | ORAL_TABLET | Freq: Once | ORAL | Status: AC
  Administered 2019-03-23: 650 mg via ORAL
  Filled 2019-03-23: qty 2

## 2019-03-23 MED ORDER — MEPERIDINE HCL 50 MG/ML IJ SOLN
INTRAMUSCULAR | Status: AC
  Filled 2019-03-23: qty 1

## 2019-03-23 MED ORDER — FENTANYL CITRATE (PF) 100 MCG/2ML IJ SOLN
INTRAMUSCULAR | Status: AC
  Filled 2019-03-23: qty 2

## 2019-03-23 MED ORDER — SODIUM CHLORIDE 0.9% IV SOLUTION: Freq: Once | INTRAVENOUS | Status: DC

## 2019-03-23 NOTE — Progress Notes (Signed)
Pt remain confused to place, time and situation. Unable to reorient dispite multiple attempts by multiple staff members. Continues to try to get out of bed and out of chair unassisted, walked down hallway short distance without oxygen again with assist x1. Have called patient's wife and had her talk with pt, but she was unable to orient patient either. Pt's daughter [name] called as well and she talked with patient, but he was belligerent to her as well. Have asked her to come sit with patient and she stated she would be on her way, but it will be about 1.5 hours bc she is coming from [place]. Stated my appreciation for her willingness to come. Supper tray delivered to room by dietary, patient is refusing all food and fluid despite being NPO since midnight. Explained to patient that  if he was able to eat, drink and not pass any further bloody stools, he would meet physician's criteria for possible discharge tomorrow. Patient stated, "I'm just a prisoner. Earlie Server will say anything to make me do what you want!". Pt refused to allow me to administer IV protonix at this time, will not straighten out arm to access IV site. Will continue to monitor for any improvement in orientation and mentation.

## 2019-03-23 NOTE — Op Note (Signed)
[facility] Patient Name: [name] Procedure Date: 03/23/2019 2:08 PM MRN: [id number] Date of Birth: [birth year] Attending MD: Norvel Richards , MD CSN: [id number] Age: 84 Admit Type: Inpatient Procedure:                Colonoscopy Indications:              Hematochezia Providers:                Norvel Richards, MD, Lurline Del, RN, Nelma Rothman, Technician Referring MD:              Medicines:                Midazolam 4 mg IV, Fentanyl 50 micrograms IV Complications:            No immediate complications. Estimated Blood Loss:     Estimated blood loss was minimal. Procedure:                Pre-Anesthesia Assessment:                           - Prior to the procedure, a History and Physical                            was performed, and patient medications and                            allergies were reviewed. The patient's tolerance of                            previous anesthesia was also reviewed. The risks                            and benefits of the procedure and the sedation                            options and risks were discussed with the patient.                            All questions were answered, and informed consent                            was obtained. Prior Anticoagulants: The patient has                            taken no previous anticoagulant or antiplatelet                            agents. ASA Grade Assessment: III - A patient with                            severe systemic disease. After reviewing the risks  and benefits, the patient was deemed in                            satisfactory condition to undergo the procedure.                           After obtaining informed consent, the colonoscope                            was passed under direct vision. Throughout the                            procedure, the patient's blood pressure, pulse, and                            oxygen  saturations were monitored continuously. The                            [id number]-[id number] ([id number]) scope was introduced through                            the anus and advanced to the 5 cm into the ileum.                            The colonoscopy was performed without difficulty.                            The patient tolerated the procedure well. The                            quality of the bowel preparation was adequate. Scope In: 2:49:28 PM Scope Out: 3:21:21 PM Scope Withdrawal Time: 0 hours 22 minutes 57 seconds  Total Procedure Duration: 0 hours 31 minutes 53 seconds  Findings:      The perianal and digital rectal examinations were normal. Minimally       blood-tinged effluent in the left colon. No active late bleeding or       suspect lesions found. Some neovascular changes in the distal rectum (?       Prior radiation treatment (patient denies)      Multiple medium-mouthed diverticula were found in the sigmoid colon and       descending colon.      Five semi-pedunculated polyps were found in the descending colon,       transverse colon and cecum. The polyps were 6 to 8 mm in size. These       polyps were removed with a cold snare. Resection and retrieval were       complete. Estimated blood loss was minimal. The distal 5 cm of TI       appeared normal. Impression:               - Diverticulosis in the sigmoid colon and in the                            descending colon. I suspect bleeding secondary to  diverticula                           - Five 6 to 8 mm polyps in the descending colon, in                            the transverse colon and in the cecum, removed with                            a cold snare. Resected and retrieved. Some subtle                            neovascular changes in the distal rectal mucosa of                            uncertain significance. Moderate Sedation:      Moderate (conscious) sedation was administered by the endoscopy  nurse       and supervised by the endoscopist. The following parameters were       monitored: oxygen saturation, heart rate, blood pressure, respiratory       rate, EKG, adequacy of pulmonary ventilation, and response to care.       Total physician intraservice time was 38 minutes. Recommendation:           - Return patient to hospital ward for observation.                            Follow hemoglobin (hemoglobin (8.3 this afternoon).                            Stop nonsteroidal agents and minimize use of                            antiplatelet therapy as feasible. Heart healthy                            diet. Recheck H&H tomorrow morning. Follow-up                            pathology. Procedure Code(s):        --- Professional ---                           [phone], Colonoscopy, flexible; with removal of                            tumor(s), polyp(s), or other lesion(s) by snare                            technique                           99153, Moderate sedation; each additional 15                            minutes intraservice time  [id number], Moderate sedation; each additional 15                            minutes intraservice time                           G0500, Moderate sedation services provided by the                            same physician or other qualified health care                            professional performing a gastrointestinal                            endoscopic service that sedation supports,                            requiring the presence of an independent trained                            observer to assist in the monitoring of the                            patient's level of consciousness and physiological                            status; initial 15 minutes of intra-service time;                            patient age 96 years or older (additional time may                            be reported with [phone], as appropriate) Diagnosis  Code(s):        --- Professional ---                           K63.5, Polyp of colon                           K92.1, Melena (includes Hematochezia)                           K57.30, Diverticulosis of large intestine without                            perforation or abscess without bleeding CPT copyright 2019 American Medical Association. All rights reserved. The codes documented in this report are preliminary and upon coder review may  be revised to meet current compliance requirements. Cristopher Estimable. Barbara Keng, MD Norvel Richards, MD 03/23/2019 3:39:07 PM This report has been signed electronically. Number of Addenda: 0

## 2019-03-23 NOTE — TOC Initial Note (Signed)
Transition of Care [facility]) - Initial/Assessment Note    Patient Details  Name: [name] MRN: [id number] Date of Birth: December 08, 1934  Transition of Care [facility]) CM/SW Contact:    Boneta Lucks, RN Phone Number: 03/23/2019, 3:05 PM  Clinical Narrative:     Patient admitted with rectal Bleeding and has a high readmission rate.  Patient lives at home with wife and Daughter.  The daughter moved in with parents to assist with care.  She is able to provide transportation to Dr Gerarda Fraction for follow up appointments.  They have used [facility] in the past.  They have not not been allowing anyone in the home due to [place].  TOC will follow for needs and update the family if recommendation are made.                     Patient Goals and CMS Choice Patient states their goals for this hospitalization and ongoing recovery are:: to go home.      Expected Discharge Plan and Services    Living arrangements for the past 2 months: Single Family Home                  Prior Living Arrangements/Services Living arrangements for the past 2 months: Single Family Home Lives with:: Spouse, Adult Children Patient language and need for interpreter reviewed:: Yes Do you feel safe going back to the place where you live?: Yes      Need for Family Participation in Patient Care: Yes (Comment) Care giver support system in place?: Yes (comment)   Criminal Activity/Legal Involvement Pertinent to Current Situation/Hospitalization: No - Comment as needed  Activities of Daily Living Home Assistive Devices/Equipment: Walker (specify type), Bedside commode/3-in-1, Shower chair without back ADL Screening (condition at time of admission) Patient's cognitive ability adequate to safely complete daily activities?: Yes Is the patient deaf or have difficulty hearing?: No Does the patient have difficulty seeing, even when wearing glasses/contacts?: No Does the patient have difficulty concentrating, remembering, or  making decisions?: No Patient able to express need for assistance with ADLs?: Yes Does the patient have difficulty dressing or bathing?: No Independently performs ADLs?: Yes (appropriate for developmental age) Does the patient have difficulty walking or climbing stairs?: Yes Weakness of Legs: Both Weakness of Arms/Hands: Both  Permission Sought/Granted Permission sought to share information with : Case Manager Permission granted to share information with : Yes, Verbal Permission Granted        Permission granted to share info w Relationship: Mariann Laster - wife      Admission diagnosis:  Lower GI bleed [K92.2] Patient Active Problem List   Diagnosis Date Noted  . Rectal bleeding 03/22/2019  . Acute hypoxemic respiratory failure ([place]) 11/03/2018  . Acute metabolic encephalopathy 87/56/4332  . Acute lower UTI 11/03/2018  . Cardiomyopathy, ischemic 07/15/2015  . Chronic obstructive pulmonary disease ([place])   . 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 Viral bronchitis 06/11/2015  . Obesity 05/31/2015  . Chronic hypoxemic respiratory failure ([place]) 05/31/2015  . COPD exacerbation ([place]) 05/30/2015  . Chronic respiratory failure ([place]) 11/23/2014  . S/P MDT ICD  6.17.13, with revision 02/10/12 for lead dislodgement 02/10/2012  . Atherosclerosis of native arteries of the extremities with intermittent claudication 08/13/2011  . Anticoagulated on warfarin 07/17/2011  . Left Ventricular thrombus s/p Anterior STEMI Oct 2012, Rx'd with Coumadin 07/02/2011  . Recent Anterior ST elevation (STEMI) myocardial infarction involving LAD, 05/2011 06/29/2011    Class: Status  post  . Chronic systolic congestive heart failure 06/29/2011    Class: Acute  . CKD (chronic kidney disease) stage 3, GFR 30-59 ml/min (HCC) 06/29/2011  . COPD (chronic obstructive pulmonary disease) ([place]) 06/29/2011    Class: Chronic  . Prostate cancer ([place]) 06/29/2011    Class: Chronic  . CARDIOMYOPATHY,  ISCHEMIC 05/08/2009  . DYSLIPIDEMIA 04/23/2009  . Essential hypertension 04/23/2009  . Coronary atherosclerosis 04/23/2009   PCP:  Redmond School, MD Pharmacy:   [place], [place] [address] [place] [address] Phone: [phone] Fax: [phone]     Social Determinants of Health (SDOH) Interventions    Readmission Risk Interventions Readmission Risk Prevention Plan 03/23/2019 11/04/2018  Transportation Screening Complete Complete  PCP or Specialist Appt within 3-5 Days Not Complete -  HRI or Home Care Consult Complete Patient refused  [place] or Home Care Consult comments - Patient states that he gets all of his services through the [place] and his daughter will coordinate that.   Social Work Consult for [place] Planning/Counseling Complete Not Complete  SW consult not completed comments - n/a  Palliative Care Screening Not Complete Not Applicable  Medication Review Press photographer) Complete Complete  Some recent data might be hidden

## 2019-03-23 NOTE — Progress Notes (Signed)
PRBC's infusing per order. Pt denies any s/s allergic reaction or fluid overload. Reminded to call for any change in breathing or if any itching/rash/nausea, etc. States understanding. VSS.

## 2019-03-23 NOTE — Plan of Care (Signed)

## 2019-03-23 NOTE — Progress Notes (Addendum)
Brief evaluation this morning in preparation for colonoscopy. Patient reports last blood per rectum this morning but less in amount. Due to dizziness with standing he has not be allowed to get up to bedside commode. Currently with watery stool with flecks (no blood) noted in depends. Denies abd pain. Abdominal exam benign. Lungs CTA. Hgb has dropped from 11.1 on admission to 7.3 this morning and associated with symptoms/tachycardia. Agreeable to prbcs. Ordered for one unit. Give tap water enema this morning. For colonoscopy today.   Laureen Ochs. Bernarda Caffey [facility] [phone] 7/29/20209:09 AM

## 2019-03-23 NOTE — Progress Notes (Signed)
Pt's daughter [name] is here per staff request. Patient still calm and cooperative, oriented to person and place. Reorients easily but quickly forgets. No c/o abd pain or n/v, tolerating po fluids without diff. Voided 249ml clear yellow urine.

## 2019-03-23 NOTE — Progress Notes (Signed)
Pt returned to room from endoscopy vis stretcher. Pt is oriented to name only. Placed in bed, attempted to reorient without success. Bed alarm on, VSS. Walked out of room and patient immediately climbing out of bed. Assisted to sit in recliner, pt states, "I gotta get out of here!". Again attempted to reorient patient time, place and situation without success. Even with staff sitting in room with patient, he continually attempts to get out of chair, pulling at O2 tubing. Pt adamant that he was walking out of room, reminded pt that he neede O2 due to history of lung disease and shortness of breath. Pt denies such history, began walking out door with O2 tubing tight around his feet. O2 removed, patient ambulated with assistance down hall approximately 50 feet before he became very SOB and turned around towards his room. Back to room, O2 reapplied. Pt states, "I gotta go get the winner so everyone can have a piece of it." Again, another unsuccessful attempt to reorient patient. Currently sitting in chair with staff at side, resp slightly labored with expiratory wheezing noted. RT called for nebulizer tx.

## 2019-03-23 NOTE — Progress Notes (Signed)
Blood infusion complete, no s/s allergic reaction or fluid overload. Post transfusion VS stable. Pt had tap water enema, only able to hold approx 592ml of fluid greater than 5 minutes. Fluid expelled was light brown in color with no solids noted. No blood noted in enema water. Endo staff up to take patient downstairs for colonoscopy via wheelchair on O2 @ 3lpm .

## 2019-03-23 NOTE — Progress Notes (Signed)
Pt ate 100% of supper tray without difficulty, n/v. More alert and cooperative. Still oriented to only person and place at this time, but calm and talkative. No c/o SOB, O2 on. Allowed me to give him his IV push medication. Bed alarm on for safety.

## 2019-03-23 NOTE — Progress Notes (Signed)
PROGRESS NOTE    [name]  NLG:[id number] DOB: Jan 12, 1935 DOA: 03/22/2019 PCP: Redmond School, MD   Brief Narrative:  Per HPI: [name] a83 y.o.male,with history of COPD on chronic home O2, hypertension, CHF, CAD, status post ICD came to hospital with complaint of rectal bleeding. Patient says that he noticed blood flecks in his stool few days ago but the bleeding increased over the past few days and yesterday he had significant amount of blood gushing out of rectum. He does have history of hemorrhoids, last colonoscopy was about 15 years ago at [facility] in [place]. He denies passing out. Denies chest pain or shortness of breath. Denies coughing up any phlegm. Denies abdominal pain or dysuria. Patient does takes Prasugrel and aspirin for CAD  I have seen and evaluated the patient at bedside.  Patient was admitted with significant rectal bleeding with noted acute blood loss anemia.  He has had some mild hypotension as a result of volume loss and has required IV fluid bolus as well as aggressive IV fluid hydration which was started by GI.  Flex sigmoidoscopy was performed without any active bleeding source noted at this time.  7/29: Patient noted to have watery stools with less bleeding noted overnight.  Unfortunately hemoglobin and hematocrit have dropped significantly this morning and 1 unit PRBC ordered by GI with plans for colonoscopy later today after tapwater enema.  Vital signs remained stable.  Assessment & Plan:   Active Problems:   Rectal bleeding   Acute on chronic blood loss anemia secondary to rectal bleed -Noted history of hemorrhoids with aspirin and Prasugrel currently being held -No active bleeding noted on flex sigmoidoscopy on 7/28 -GI plans for colonoscopy to better evaluate today -Plan for PRBC transfusion today -Repeat CBC in a.m. -Remain on IV PPI daily  CAD status post stent to LAD in 2012 with ICD -With holding antiplatelet agents for  now  COPD -Continue duo nebs -No acute bronchospasms noted  CKD stage III-4 -Appears stable -We will follow renal panel  Suspected UTI versus pyuria -Urine cultures pending -Currently on Rocephin empirically  DVT prophylaxis: SCDs Code Status: Full Family Communication: None at bedside Disposition Plan: PRBC transfusion today with plans for colonoscopy per GI for further evaluation of rectal bleeding today.  Monitor repeat CBC in a.m.   Consultants:   GI  Procedures:   Flex sig colonoscopy on 7/28  Antimicrobials:  Anti-infectives (From admission, onward)   Start     Dose/Rate Route Frequency Ordered Stop   03/23/19 0600  cefTRIAXone (ROCEPHIN) 1 g in sodium chloride 0.9 % 100 mL IVPB     1 g 200 mL/hr over 30 Minutes Intravenous Every 24 hours 03/23/19 0432         Subjective: Patient seen and evaluated today with no new acute complaints or concerns. No acute concerns or events noted overnight.  He was noted to have some watery stools overnight with flecks of blood, but no significant bleeding or abdominal pain otherwise noted.  PRBC transfusion today with plans for colonoscopy.  Objective: Vitals:   03/23/19 0518 03/23/19 0806 03/23/19 1015 03/23/19 1101  BP: 103/73  101/67 98/65  Pulse: (!) 107  79 71  Resp: (!) 24  18 16   Temp: 98.5 F (36.9 C)  98.7 F (37.1 C) 99.2 F (37.3 C)  TempSrc: Oral  Oral Oral  SpO2: 95% 97% 100% 100%  Weight:      Height:        Intake/Output Summary (Last  24 hours) at 03/23/2019 1240 Last data filed at 03/23/2019 1221 Gross per 24 hour  Intake 1713.48 ml  Output 100 ml  Net 1613.48 ml   Filed Weights   03/22/19 0455 03/22/19 1007  Weight: 107 kg 93.7 kg    Examination:  General exam: Appears calm and comfortable  Respiratory system: Clear to auscultation. Respiratory effort normal. Cardiovascular system: S1 & S2 heard, RRR. No JVD, murmurs, rubs, gallops or clicks. No pedal edema. Gastrointestinal system:  Abdomen is nondistended, soft and nontender. No organomegaly or masses felt. Normal bowel sounds heard. Central nervous system: Alert and oriented. No focal neurological deficits. Extremities: Symmetric 5 x 5 power. Skin: No rashes, lesions or ulcers Psychiatry: Judgement and insight appear normal. Mood & affect appropriate.     Data Reviewed: I have personally reviewed following labs and imaging studies  CBC: Recent Labs  Lab 03/22/19 0511 03/22/19 1116 03/23/19 0557  WBC 8.0  --  8.2  NEUTROABS 5.6  --   --   HGB 11.1* 9.2* 7.3*  HCT 34.8* 28.7* 23.1*  MCV 99.1  --  100.4*  PLT 216  --  638   Basic Metabolic Panel: Recent Labs  Lab 03/22/19 0511 03/23/19 0557  NA 140 139  K 4.4 3.6  CL 104 105  CO2 26 27  GLUCOSE 195* 121*  BUN 24* 28*  CREATININE 1.87* 1.69*  CALCIUM 8.4* 7.9*   GFR: Estimated Creatinine Clearance: 37.4 mL/min (A) (by C-G formula based on SCr of 1.69 mg/dL (H)). Liver Function Tests: Recent Labs  Lab 03/23/19 0557  AST 19  ALT 19  ALKPHOS 62  BILITOT 0.5  PROT 5.3*  ALBUMIN 3.3*   No results for input(s): LIPASE, AMYLASE in the last 168 hours. No results for input(s): AMMONIA in the last 168 hours. Coagulation Profile: Recent Labs  Lab 03/22/19 0511  INR 1.0   Cardiac Enzymes: No results for input(s): CKTOTAL, CKMB, CKMBINDEX, TROPONINI in the last 168 hours. BNP (last 3 results) No results for input(s): PROBNP in the last 8760 hours. HbA1C: No results for input(s): HGBA1C in the last 72 hours. CBG: No results for input(s): GLUCAP in the last 168 hours. Lipid Profile: No results for input(s): CHOL, HDL, LDLCALC, TRIG, CHOLHDL, LDLDIRECT in the last 72 hours. Thyroid Function Tests: No results for input(s): TSH, T4TOTAL, FREET4, T3FREE, THYROIDAB in the last 72 hours. Anemia Panel: No results for input(s): VITAMINB12, FOLATE, FERRITIN, TIBC, IRON, RETICCTPCT in the last 72 hours. Sepsis Labs: No results for input(s):  PROCALCITON, LATICACIDVEN in the last 168 hours.  Recent Results (from the past 240 hour(s))  SARS Coronavirus 2 (CEPHEID - Performed in [place] hospital lab), Hosp Order     Status: None   Collection Time: 03/22/19  5:29 AM   Specimen: Nasopharyngeal Swab  Result Value Ref Range Status   SARS Coronavirus 2 NEGATIVE NEGATIVE Final    Comment: (NOTE) If result is NEGATIVE SARS-CoV-2 target nucleic acids are NOT DETECTED. The SARS-CoV-2 RNA is generally detectable in upper and lower  respiratory specimens during the acute phase of infection. The lowest  concentration of SARS-CoV-2 viral copies this assay can detect is 250  copies / mL. A negative result does not preclude SARS-CoV-2 infection  and should not be used as the sole basis for treatment or other  patient management decisions.  A negative result may occur with  improper specimen collection / handling, submission of specimen other  than nasopharyngeal swab, presence of viral mutation(s) within  the  areas targeted by this assay, and inadequate number of viral copies  (<250 copies / mL). A negative result must be combined with clinical  observations, patient history, and epidemiological information. If result is POSITIVE SARS-CoV-2 target nucleic acids are DETECTED. The SARS-CoV-2 RNA is generally detectable in upper and lower  respiratory specimens dur ing the acute phase of infection.  Positive  results are indicative of active infection with SARS-CoV-2.  Clinical  correlation with patient history and other diagnostic information is  necessary to determine patient infection status.  Positive results do  not rule out bacterial infection or co-infection with other viruses. If result is PRESUMPTIVE POSTIVE SARS-CoV-2 nucleic acids MAY BE PRESENT.   A presumptive positive result was obtained on the submitted specimen  and confirmed on repeat testing.  While 2019 novel coronavirus  (SARS-CoV-2) nucleic acids may be present in  the submitted sample  additional confirmatory testing may be necessary for epidemiological  and / or clinical management purposes  to differentiate between  SARS-CoV-2 and other Sarbecovirus currently known to infect humans.  If clinically indicated additional testing with an alternate test  methodology [phone]) is advised. The SARS-CoV-2 RNA is generally  detectable in upper and lower respiratory sp ecimens during the acute  phase of infection. The expected result is Negative. Fact Sheet for Patients:  StrictlyIdeas.no Fact Sheet for Healthcare Providers: BankingDealers.co.za This test is not yet approved or cleared by the [place] FDA and has been authorized for detection and/or diagnosis of SARS-CoV-2 by FDA under an Emergency Use Authorization (EUA).  This EUA will remain in effect (meaning this test can be used) for the duration of the COVID-19 declaration under Section 564(b)(1) of the Act, 21 U.S.C. section 360bbb-3(b)(1), unless the authorization is terminated or revoked sooner. Performed at [facility], [address], [place], [place] [address]          Radiology Studies: No results found.      Scheduled Meds:  sodium chloride   Intravenous Once   bisoprolol  2.5 mg Oral Daily   diltiazem  120 mg Oral BID   escitalopram  20 mg Oral Daily   ipratropium-albuterol  3 mL Nebulization TID   pantoprazole (PROTONIX) IV  40 mg Intravenous Q24H   predniSONE  10 mg Oral Daily   Continuous Infusions:  cefTRIAXone (ROCEPHIN)  IV 1 g (03/23/19 0556)   sodium chloride     sodium chloride       LOS: 1 day    Time spent: 30 minutes    Oksana Deberry Darleen Crocker, DO [facility] Pager [phone]  If 7PM-7AM, please contact night-coverage [email or web address] Password [email or web address] 03/23/2019, 12:40 PM

## 2019-03-24 DIAGNOSIS — K922 Gastrointestinal hemorrhage, unspecified: Secondary | ICD-10-CM

## 2019-03-24 LAB — COMPREHENSIVE METABOLIC PANEL
ALT: 19 U/L (ref 0–44)
AST: 23 U/L (ref 15–41)
Albumin: 3.6 g/dL (ref 3.5–5.0)
Alkaline Phosphatase: 68 U/L (ref 38–126)
Anion gap: 8 (ref 5–15)
BUN: 21 mg/dL (ref 8–23)
CO2: 27 mmol/L (ref 22–32)
Calcium: 8.2 mg/dL — ABNORMAL LOW (ref 8.9–10.3)
Chloride: 108 mmol/L (ref 98–111)
Creatinine, Ser: 1.68 mg/dL — ABNORMAL HIGH (ref 0.61–1.24)
GFR calc Af Amer: 43 mL/min — ABNORMAL LOW (ref >60)
GFR calc non Af Amer: 38 mL/min — ABNORMAL LOW (ref >60)
Glucose, Bld: 120 mg/dL — ABNORMAL HIGH (ref 70–99)
Potassium: 3.7 mmol/L (ref 3.5–5.1)
Sodium: 143 mmol/L (ref 135–145)
Total Bilirubin: 0.4 mg/dL (ref 0.3–1.2)
Total Protein: 5.7 g/dL — ABNORMAL LOW (ref 6.5–8.1)

## 2019-03-24 LAB — CBC
HCT: 25.4 % — ABNORMAL LOW (ref 39.0–52.0)
Hemoglobin: 8.1 g/dL — ABNORMAL LOW (ref 13.0–17.0)
MCH: 32.1 pg (ref 26.0–34.0)
MCHC: 31.9 g/dL (ref 30.0–36.0)
MCV: 100.8 fL — ABNORMAL HIGH (ref 80.0–100.0)
Platelets: 161 10*3/uL (ref 150–400)
RBC: 2.52 MIL/uL — ABNORMAL LOW (ref 4.22–5.81)
RDW: 14.2 % (ref 11.5–15.5)
WBC: 7.8 10*3/uL (ref 4.0–10.5)
nRBC: 0 % (ref 0.0–0.2)

## 2019-03-24 LAB — URINE CULTURE

## 2019-03-24 MED ORDER — CEFDINIR 300 MG PO CAPS: 300.0000 mg | ORAL_CAPSULE | Freq: Two times a day (BID) | ORAL | 0 refills | Status: AC

## 2019-03-24 NOTE — Plan of Care (Signed)
  Problem: Education: Goal: Ability to identify signs and symptoms of gastrointestinal bleeding will improve Outcome: Adequate for Discharge   Problem: Bowel/Gastric: Goal: Will show no signs and symptoms of gastrointestinal bleeding Outcome: Adequate for Discharge   Problem: Fluid Volume: Goal: Will show no signs and symptoms of excessive bleeding Outcome: Adequate for Discharge   Problem: Clinical Measurements: Goal: Complications related to the disease process, condition or treatment will be avoided or minimized Outcome: Adequate for Discharge   

## 2019-03-24 NOTE — Progress Notes (Signed)
Discharge instructions given with stated understanding.  Patient's daughter here to transport patient home.

## 2019-03-24 NOTE — Progress Notes (Signed)
Pt noted being verbally combative with staff, refusing to have vitals taken or to have labs taken. Pt told Probation officer " kiss my ass and get out of my room, I'll shoot you!" Writer notified daughter [name] who attempted to calm pt down via phone. Pt continued to refuse care. Daughter at bedside per staff request to assist with care. Pt refused med this morning despite educating pt on rationale for medication x2.

## 2019-03-24 NOTE — Care Management (Signed)
Per attending, family requesting information on assisted living facilities. Went to room to provide list of facilities, introduced myself to patient and daughter, explained reason for visit. Patient became abrupt and asked " do I have a say in this, I am not interested in going to a home" ,  I explained that he absolutely does and that I would leave the list with them should he become interested in a ALF.  CM left room with nurse tech in room with family assisting patient in washing up.

## 2019-03-24 NOTE — Progress Notes (Signed)
  Subjective:  Some confusion over night. Improved this morning. Daughter at bedside. Patient eating breakfast. Daughter states he has some mild dementia. No blood per rectum since colonoscopy.   Objective: Vital signs in last 24 hours: Temp:  [97.4 F (36.3 C)-99.2 F (37.3 C)] 98.5 F (36.9 C) (07/29 1354) Pulse Rate:  [39-82] 82 (07/29 2120) Resp:  [16-35] 20 (07/29 2120) BP: (98-132)/(59-92) 99/69 (07/29 2120) SpO2:  [89 %-100 %] 98 % (07/29 2120) Weight:  [105.7 kg] 105.7 kg (07/29 1354) Last BM Date: 03/22/29 General:   Alert,  Well-developed, well-nourished, pleasant and cooperative in NAD Head:  Normocephalic and atraumatic. Eyes:  Sclera clear, no icterus.  Chest: CTA bilaterally without rales, rhonchi, crackles.    Heart:  Regular rate and rhythm; no murmurs, clicks, rubs,  or gallops. Abdomen:  Soft, nontender and nondistended.  Normal BS.  Extremities:  Without clubbing, deformity or edema. Neurologic:  Alert and  oriented to person/place Psych:  Alert and cooperative. Normal mood and affect.  Intake/Output from previous day: 07/29 0701 - 07/30 0700 In: 770.6 [I.V.:50; Blood:630; IV Piggyback:90.6] Out: 350 [Urine:350] Intake/Output this shift: No intake/output data recorded.  Lab Results: CBC Recent Labs    03/22/19 0511  03/23/19 0557 03/23/19 1414 03/24/19 0609  WBC 8.0  --  8.2  --  7.8  HGB 11.1*   < > 7.3* 8.3* 8.1*  HCT 34.8*   < > 23.1* 25.7* 25.4*  MCV 99.1  --  100.4*  --  100.8*  PLT 216  --  162  --  161   < > = values in this interval not displayed.   BMET Recent Labs    03/22/19 0511 03/23/19 0557 03/24/19 0609  NA 140 139 143  K 4.4 3.6 3.7  CL 104 105 108  CO2 26 27 27   GLUCOSE 195* 121* 120*  BUN 24* 28* 21  CREATININE 1.87* 1.69* 1.68*  CALCIUM 8.4* 7.9* 8.2*   LFTs Recent Labs    03/23/19 0557 03/24/19 0609  BILITOT 0.5 0.4  ALKPHOS 62 68  AST 19 23  ALT 19 19  PROT 5.3* 5.7*  ALBUMIN 3.3* 3.6   No results for  input(s): LIPASE in the last 72 hours. PT/INR Recent Labs    03/22/19 0511  LABPROT 13.4  INR 1.0      Imaging Studies: No results found.[2 weeks]   Assessment: 84 year old male with history of COPD, hypertension, CHF, CAD, ICD, CKD who presented to the ED with blood per rectum.  He has required 1 unit of packed red blood cells patient.  Flexible sigmoidoscopy July 28, rectal bleeding most likely secondary to diverticulosis in the rectosigmoid region, visualization difficult due to liquid semisolid stool present.  Colonoscopy yesterday showed minimally blood-tinged effluent in the left colon no active bleeding or suspect lesions found.  Some neovascular changes in the distal rectum query prior radiation treatment (patient had seed implants), 5 semi-pedunculated polyps were found in the descending colon, transverse colon, cecum measuring 6 to 8 mm in size.  These were removed with cold snare.  Distal 5 cm of terminal ileum appeared normal.  Suspected diverticular bleed. No evidence of persistent GI bleeding.   Plan: 1. Follow-up pending path. 2. GI bleeding appears to have stopped. Will sign off for now. Call with any questions.   Laureen Ochs. Bernarda Caffey [facility] [phone] 7/30/202010:00 AM     LOS: 2 days

## 2019-03-24 NOTE — Discharge Summary (Signed)
Physician Discharge Summary  [name]:[id number] DOB: 07/22/1935 DOA: 03/22/2019  PCP: Redmond School, MD  Admit date: 03/22/2019  Discharge date: 03/24/2019  Admitted From:Home  Disposition:  Home  Recommendations for Outpatient Follow-up:  1. Follow up with PCP in 1-2 weeks 2. Follow-up with GI at the [facility] and repeat CBC in 1 week to ensure stability 3. Remain on prior medications as ordered 4. Continue on Omnicef for 3 more days to complete course of treatment for UTI with urine culture still pending and will need follow-up in outpatient setting  Home Health: None  Equipment/Devices: Has home oxygen  Discharge Condition: Stable  CODE STATUS: Full  Diet recommendation: Heart Healthy  Brief/Interim Summary: Per HPI: [name] a83 y.o.male,with history of COPD on chronic home O2, hypertension, CHF, CAD, status post ICD came to hospital with complaint of rectal bleeding. Patient says that he noticed blood flecks in his stool few days ago but the bleeding increased over the past few days and yesterday he had significant amount of blood gushing out of rectum. He does have history of hemorrhoids, last colonoscopy was about 15 years ago at [facility] in [place]. He denies passing out. Denies chest pain or shortness of breath. Denies coughing up any phlegm. Denies abdominal pain or dysuria. Patient does takes Prasugrel and aspirin for CAD  Patient was admitted with significant rectal bleeding and associated acute blood loss anemia.  He also had periods of mild hypotension as a result of the volume loss and underwent flex sigmoidoscopy on the day of admission without any active bleeding otherwise noted.  He underwent full colon prep and underwent colonoscopy on 7/29 with noted diverticular bleed stigmata, but no active bleed otherwise.  He did undergo 1 unit PRBC transfusion and has had stable hemoglobin levels otherwise noted as below.  Vital signs have also remained  stable and no other acute events have been noted throughout the course of this admission.  He has been seen by GI on the day of discharge with no further recommendations at this time other than close follow-up with his GI doctor at the [place].  Urine cultures are still currently pending and he was started on Rocephin for presumed UTI.  Will discharge on Omnicef to complete course of treatment and will need follow-up evaluation of urine cultures that were obtained during this hospitalization.  Discharge Diagnoses:  Active Problems:   Rectal bleeding   Lower GI bleed    Discharge Instructions  Discharge Instructions    Diet - low sodium heart healthy   Complete by: As directed    Increase activity slowly   Complete by: As directed      Allergies as of 03/24/2019      Reactions   Demerol Other (See Comments)   Abnormal behavior   Zolpidem Tartrate Other (See Comments)   Pt. Became confused and aggitated      Medication List    TAKE these medications   aspirin 81 MG tablet Take 81 mg by mouth daily.   bisoprolol 5 MG tablet Commonly known as: ZEBETA Take 0.5 tablets (2.5 mg total) by mouth daily.   carisoprodol 350 MG tablet Commonly known as: SOMA Take 350 mg by mouth 4 (four) times daily as needed for muscle spasms.   cefdinir 300 MG capsule Commonly known as: OMNICEF Take 1 capsule (300 mg total) by mouth 2 (two) times daily for 3 days.   chlorpheniramine 4 MG tablet Commonly known as: CHLOR-TRIMETON Take 4 mg by mouth  2 (two) times daily.   diltiazem 120 MG 12 hr capsule Commonly known as: CARDIZEM SR Take 120 mg by mouth 2 (two) times daily.   escitalopram 20 MG tablet Commonly known as: LEXAPRO Take 20 mg by mouth daily.   ferrous sulfate 325 (65 FE) MG tablet Take 325 mg by mouth daily.   fluticasone 50 MCG/ACT nasal spray Commonly known as: FLONASE Place 2 sprays into both nostrils every morning. *Occasional night use   furosemide 40 MG tablet Commonly  known as: LASIX Take 40 mg by mouth daily as needed for fluid.   gabapentin 600 MG tablet Commonly known as: NEURONTIN Take 600 mg by mouth 2 (two) times daily.   HYDROcodone-acetaminophen 10-325 MG tablet Commonly known as: NORCO Take 1 tablet by mouth 4 (four) times daily as needed. pain   ipratropium 0.03 % nasal spray Commonly known as: ATROVENT Place 1 spray into both nostrils every 12 (twelve) hours.   ipratropium-albuterol 0.5-2.5 (3) MG/3ML Soln Commonly known as: DUONEB Take 3 mLs by nebulization 4 (four) times daily.   levalbuterol 0.63 MG/3ML nebulizer solution Commonly known as: XOPENEX Take 1 ampule by nebulization 4 (four) times daily. For shortness of breath   levalbuterol 45 MCG/ACT inhaler Commonly known as: XOPENEX HFA Inhale into the lungs every 4 (four) hours as needed for wheezing.   montelukast 10 MG tablet Commonly known as: SINGULAIR Take 10 mg by mouth daily.   Multi-Vitamins Tabs Take 1 tablet by mouth 2 (two) times daily.   naproxen sodium 220 MG tablet Commonly known as: ALEVE Take 220 mg by mouth as needed.   nitroGLYCERIN 0.4 MG SL tablet Commonly known as: NITROSTAT Place 0.4 mg under the tongue every 5 (five) minutes as needed. For chest pain   omeprazole 20 MG capsule Commonly known as: PRILOSEC Take 40 mg by mouth 2 (two) times daily.   potassium chloride 10 MEQ CR capsule Commonly known as: MICRO-K Take 10 mEq by mouth daily.   predniSONE 10 MG tablet Commonly known as: DELTASONE Take 1 tablet (10 mg total) by mouth daily.   rosuvastatin 40 MG tablet Commonly known as: CRESTOR Take 40 mg by mouth daily.   terazosin 2 MG capsule Commonly known as: HYTRIN Take 2 mg by mouth at bedtime.   triamcinolone cream 0.5 % Commonly known as: KENALOG Apply 1 application topically 2 (two) times daily as needed (for skin irritation).      Follow-up Information    Redmond School, MD Follow up in 1 week(s).   Specialty: Internal  Medicine Contact information: [address] [place] [address] [phone]          Allergies  Allergen Reactions  . Demerol Other (See Comments)    Abnormal behavior  . Zolpidem Tartrate Other (See Comments)    Pt. Became confused and aggitated    Consultations:  GI   Procedures/Studies:  No results found.  Discharge Exam: Vitals:   03/23/19 2120 03/24/19 0919  BP: 99/69   Pulse: 82   Resp: 20   Temp:    SpO2: 98% 97%   Vitals:   03/23/19 1623 03/23/19 1934 03/23/19 2120 03/24/19 0919  BP:   99/69   Pulse:   82   Resp:   20   Temp:      TempSrc:      SpO2: 99% 92% 98% 97%  Weight:      Height:        General: Pt is alert, awake, not in  acute distress Cardiovascular: RRR, S1/S2 +, no rubs, no gallops Respiratory: CTA bilaterally, no wheezing, no rhonchi, on 3 L nasal cannula which he wears at home Abdominal: Soft, NT, ND, bowel sounds + Extremities: no edema, no cyanosis    The results of significant diagnostics from this hospitalization (including imaging, microbiology, ancillary and laboratory) are listed below for reference.     Microbiology: Recent Results (from the past 240 hour(s))  SARS Coronavirus 2 (CEPHEID - Performed in [place] [facility] hospital lab), Hosp Order     Status: None   Collection Time: 03/22/19  5:29 AM   Specimen: Nasopharyngeal Swab  Result Value Ref Range Status   SARS Coronavirus 2 NEGATIVE NEGATIVE Final    Comment: (NOTE) If result is NEGATIVE SARS-CoV-2 target nucleic acids are NOT DETECTED. The SARS-CoV-2 RNA is generally detectable in upper and lower  respiratory specimens during the acute phase of infection. The lowest  concentration of SARS-CoV-2 viral copies this assay can detect is 250  copies / mL. A negative result does not preclude SARS-CoV-2 infection  and should not be used as the sole basis for treatment or other  patient management decisions.  A negative result may occur with  improper specimen  collection / handling, submission of specimen other  than nasopharyngeal swab, presence of viral mutation(s) within the  areas targeted by this assay, and inadequate number of viral copies  (<250 copies / mL). A negative result must be combined with clinical  observations, patient history, and epidemiological information. If result is POSITIVE SARS-CoV-2 target nucleic acids are DETECTED. The SARS-CoV-2 RNA is generally detectable in upper and lower  respiratory specimens dur ing the acute phase of infection.  Positive  results are indicative of active infection with SARS-CoV-2.  Clinical  correlation with patient history and other diagnostic information is  necessary to determine patient infection status.  Positive results do  not rule out bacterial infection or co-infection with other viruses. If result is PRESUMPTIVE POSTIVE SARS-CoV-2 nucleic acids MAY BE PRESENT.   A presumptive positive result was obtained on the submitted specimen  and confirmed on repeat testing.  While 2019 novel coronavirus  (SARS-CoV-2) nucleic acids may be present in the submitted sample  additional confirmatory testing may be necessary for epidemiological  and / or clinical management purposes  to differentiate between  SARS-CoV-2 and other Sarbecovirus currently known to infect humans.  If clinically indicated additional testing with an alternate test  methodology [phone]) is advised. The SARS-CoV-2 RNA is generally  detectable in upper and lower respiratory sp ecimens during the acute  phase of infection. The expected result is Negative. Fact Sheet for Patients:  StrictlyIdeas.no Fact Sheet for Healthcare Providers: BankingDealers.co.za This test is not yet approved or cleared by the [place] FDA and has been authorized for detection and/or diagnosis of SARS-CoV-2 by FDA under an Emergency Use Authorization (EUA).  This EUA will remain in effect  (meaning this test can be used) for the duration of the COVID-19 declaration under Section 564(b)(1) of the Act, 21 U.S.C. section 360bbb-3(b)(1), unless the authorization is terminated or revoked sooner. Performed at [facility], [address], [place], [place] [address]      Labs: BNP (last 3 results) No results for input(s): BNP in the last 8760 hours. Basic Metabolic Panel: Recent Labs  Lab 03/22/19 0511 03/23/19 0557 03/24/19 0609  NA 140 139 143  K 4.4 3.6 3.7  CL 104 105 108  CO2 26 27 27   GLUCOSE 195* 121*  120*  BUN 24* 28* 21  CREATININE 1.87* 1.69* 1.68*  CALCIUM 8.4* 7.9* 8.2*   Liver Function Tests: Recent Labs  Lab 03/23/19 0557 03/24/19 0609  AST 19 23  ALT 19 19  ALKPHOS 62 68  BILITOT 0.5 0.4  PROT 5.3* 5.7*  ALBUMIN 3.3* 3.6   No results for input(s): LIPASE, AMYLASE in the last 168 hours. No results for input(s): AMMONIA in the last 168 hours. CBC: Recent Labs  Lab 03/22/19 0511 03/22/19 1116 03/23/19 0557 03/23/19 1414 03/24/19 0609  WBC 8.0  --  8.2  --  7.8  NEUTROABS 5.6  --   --   --   --   HGB 11.1* 9.2* 7.3* 8.3* 8.1*  HCT 34.8* 28.7* 23.1* 25.7* 25.4*  MCV 99.1  --  100.4*  --  100.8*  PLT 216  --  162  --  161   Cardiac Enzymes: No results for input(s): CKTOTAL, CKMB, CKMBINDEX, TROPONINI in the last 168 hours. BNP: Invalid input(s): POCBNP CBG: No results for input(s): GLUCAP in the last 168 hours. D-Dimer No results for input(s): DDIMER in the last 72 hours. Hgb A1c No results for input(s): HGBA1C in the last 72 hours. Lipid Profile No results for input(s): CHOL, HDL, LDLCALC, TRIG, CHOLHDL, LDLDIRECT in the last 72 hours. Thyroid function studies No results for input(s): TSH, T4TOTAL, T3FREE, THYROIDAB in the last 72 hours.  Invalid input(s): FREET3 Anemia work up No results for input(s): VITAMINB12, FOLATE, FERRITIN, TIBC, IRON, RETICCTPCT in the last 72 hours. Urinalysis    Component Value Date/Time    COLORURINE YELLOW 03/23/2019 0321   APPEARANCEUR HAZY (A) 03/23/2019 0321   LABSPEC 1.020 03/23/2019 0321   PHURINE 5.0 03/23/2019 0321   GLUCOSEU NEGATIVE 03/23/2019 0321   HGBUR MODERATE (A) 03/23/2019 0321   BILIRUBINUR NEGATIVE 03/23/2019 0321   KETONESUR NEGATIVE 03/23/2019 0321   PROTEINUR NEGATIVE 03/23/2019 0321   UROBILINOGEN 0.2 06/10/2015 1430   NITRITE NEGATIVE 03/23/2019 0321   LEUKOCYTESUR SMALL (A) 03/23/2019 0321   Sepsis Labs Invalid input(s): PROCALCITONIN,  WBC,  LACTICIDVEN Microbiology Recent Results (from the past 240 hour(s))  SARS Coronavirus 2 (CEPHEID - Performed in [place] hospital lab), Hosp Order     Status: None   Collection Time: 03/22/19  5:29 AM   Specimen: Nasopharyngeal Swab  Result Value Ref Range Status   SARS Coronavirus 2 NEGATIVE NEGATIVE Final    Comment: (NOTE) If result is NEGATIVE SARS-CoV-2 target nucleic acids are NOT DETECTED. The SARS-CoV-2 RNA is generally detectable in upper and lower  respiratory specimens during the acute phase of infection. The lowest  concentration of SARS-CoV-2 viral copies this assay can detect is 250  copies / mL. A negative result does not preclude SARS-CoV-2 infection  and should not be used as the sole basis for treatment or other  patient management decisions.  A negative result may occur with  improper specimen collection / handling, submission of specimen other  than nasopharyngeal swab, presence of viral mutation(s) within the  areas targeted by this assay, and inadequate number of viral copies  (<250 copies / mL). A negative result must be combined with clinical  observations, patient history, and epidemiological information. If result is POSITIVE SARS-CoV-2 target nucleic acids are DETECTED. The SARS-CoV-2 RNA is generally detectable in upper and lower  respiratory specimens dur ing the acute phase of infection.  Positive  results are indicative of active infection with SARS-CoV-2.   Clinical  correlation with patient history and  other diagnostic information is  necessary to determine patient infection status.  Positive results do  not rule out bacterial infection or co-infection with other viruses. If result is PRESUMPTIVE POSTIVE SARS-CoV-2 nucleic acids MAY BE PRESENT.   A presumptive positive result was obtained on the submitted specimen  and confirmed on repeat testing.  While 2019 novel coronavirus  (SARS-CoV-2) nucleic acids may be present in the submitted sample  additional confirmatory testing may be necessary for epidemiological  and / or clinical management purposes  to differentiate between  SARS-CoV-2 and other Sarbecovirus currently known to infect humans.  If clinically indicated additional testing with an alternate test  methodology [phone]) is advised. The SARS-CoV-2 RNA is generally  detectable in upper and lower respiratory sp ecimens during the acute  phase of infection. The expected result is Negative. Fact Sheet for Patients:  StrictlyIdeas.no Fact Sheet for Healthcare Providers: BankingDealers.co.za This test is not yet approved or cleared by the [place] FDA and has been authorized for detection and/or diagnosis of SARS-CoV-2 by FDA under an Emergency Use Authorization (EUA).  This EUA will remain in effect (meaning this test can be used) for the duration of the COVID-19 declaration under Section 564(b)(1) of the Act, 21 U.S.C. section 360bbb-3(b)(1), unless the authorization is terminated or revoked sooner. Performed at [facility], [address], [place], [place] [address]      Time coordinating discharge: 35 minutes  SIGNED:   Rodena Goldmann, DO [facility] 03/24/2019, 11:22 AM  If 7PM-7AM, please contact night-coverage [email or web address] Password [email or web address]

## 2019-03-25 ENCOUNTER — Encounter: Payer: Self-pay | Admitting: Internal Medicine

## 2019-03-25 ENCOUNTER — Encounter (HOSPITAL_COMMUNITY): Payer: Self-pay | Admitting: Gastroenterology

## 2019-03-27 LAB — TYPE AND SCREEN
ABO/RH(D): A POS
Antibody Screen: NEGATIVE
Unit division: 0
Unit division: 0

## 2019-03-27 LAB — BPAM RBC
Blood Product Expiration Date: 202008182359
Blood Product Expiration Date: 202008222359
ISSUE DATE / TIME: 202007291037
Unit Type and Rh: 6200
Unit Type and Rh: 6200

## 2019-03-28 ENCOUNTER — Other Ambulatory Visit: Payer: Self-pay

## 2019-03-28 NOTE — Patient Outreach (Signed)
[facility]) Care Management  03/28/2019  [name] July 04, 1935 [id number]    EMMI-General Discharge RED ON EMMI ALERT Day # 1 Date: 03/26/2019 Red Alert Reason: "Unfilled prescriptions? Yes"   Outreach attempt # 1 to patient. Spoke with patient who is pleased to report that he is "is doing great and gaining strength fast." He denies any acute issues or concerns at present. Reviewed and addressed red alert with patient. Error in response. He denies any issues with meds and confirms that he has all his meds in the home and understands when and how to take them. Patient reports he has not had a good BM since returning home. He has laxative in the home but unsure if its safe for him to take given his surgery. He will talk with MD during follow up appt tomorrow regarding this. He denies any other GI symptoms and reports appetite has been good. RN CM offered some non-pharmacological measures to help. He voices that his daughter will be taking to appt. No further RN CM needs or concerns identified during this call.      Plan: RN CM will close case as no further interventions needed at this time.   Enzo Montgomery, RN,BSN,CCM [place] Management Telephonic Care Management Coordinator Direct Phone: [phone] Toll Free: [phone] Fax: [phone]

## 2019-03-30 DIAGNOSIS — J9610 Chronic respiratory failure, unspecified whether with hypoxia or hypercapnia: Secondary | ICD-10-CM | POA: Diagnosis not present

## 2019-03-30 DIAGNOSIS — J449 Chronic obstructive pulmonary disease, unspecified: Secondary | ICD-10-CM | POA: Diagnosis not present

## 2019-03-30 DIAGNOSIS — K922 Gastrointestinal hemorrhage, unspecified: Secondary | ICD-10-CM | POA: Diagnosis not present

## 2019-03-30 DIAGNOSIS — D649 Anemia, unspecified: Secondary | ICD-10-CM | POA: Diagnosis not present

## 2019-05-13 ENCOUNTER — Other Ambulatory Visit: Payer: Self-pay

## 2019-05-13 ENCOUNTER — Encounter (HOSPITAL_COMMUNITY): Payer: Self-pay

## 2019-05-13 ENCOUNTER — Emergency Department (HOSPITAL_COMMUNITY)
Admission: EM | Admit: 2019-05-13 | Discharge: 2019-05-14 | Disposition: A | Discharge status: Home / Self Care | Payer: No Typology Code available for payment source | Attending: Emergency Medicine | Admitting: Emergency Medicine

## 2019-05-13 DIAGNOSIS — N183 Chronic kidney disease, stage 3 (moderate): Secondary | ICD-10-CM | POA: Diagnosis not present

## 2019-05-13 DIAGNOSIS — Z950 Presence of cardiac pacemaker: Secondary | ICD-10-CM | POA: Diagnosis not present

## 2019-05-13 DIAGNOSIS — Z955 Presence of coronary angioplasty implant and graft: Secondary | ICD-10-CM | POA: Diagnosis not present

## 2019-05-13 DIAGNOSIS — R338 Other retention of urine: Secondary | ICD-10-CM | POA: Insufficient documentation

## 2019-05-13 DIAGNOSIS — I252 Old myocardial infarction: Secondary | ICD-10-CM | POA: Diagnosis not present

## 2019-05-13 DIAGNOSIS — Z79899 Other long term (current) drug therapy: Secondary | ICD-10-CM | POA: Diagnosis not present

## 2019-05-13 DIAGNOSIS — Z87891 Personal history of nicotine dependence: Secondary | ICD-10-CM | POA: Diagnosis not present

## 2019-05-13 DIAGNOSIS — Z7982 Long term (current) use of aspirin: Secondary | ICD-10-CM | POA: Diagnosis not present

## 2019-05-13 DIAGNOSIS — J449 Chronic obstructive pulmonary disease, unspecified: Secondary | ICD-10-CM | POA: Insufficient documentation

## 2019-05-13 DIAGNOSIS — R339 Retention of urine, unspecified: Secondary | ICD-10-CM | POA: Diagnosis not present

## 2019-05-13 DIAGNOSIS — I25119 Atherosclerotic heart disease of native coronary artery with unspecified angina pectoris: Secondary | ICD-10-CM | POA: Insufficient documentation

## 2019-05-13 DIAGNOSIS — I5022 Chronic systolic (congestive) heart failure: Secondary | ICD-10-CM | POA: Diagnosis not present

## 2019-05-13 DIAGNOSIS — Z8546 Personal history of malignant neoplasm of prostate: Secondary | ICD-10-CM | POA: Diagnosis not present

## 2019-05-13 DIAGNOSIS — I130 Hypertensive heart and chronic kidney disease with heart failure and stage 1 through stage 4 chronic kidney disease, or unspecified chronic kidney disease: Secondary | ICD-10-CM | POA: Insufficient documentation

## 2019-05-13 MED ORDER — IPRATROPIUM BROMIDE HFA 17 MCG/ACT IN AERS
2.0000 | INHALATION_SPRAY | Freq: Once | RESPIRATORY_TRACT | Status: DC
  Filled 2019-05-13: qty 12.9

## 2019-05-13 MED ORDER — ALBUTEROL SULFATE HFA 108 (90 BASE) MCG/ACT IN AERS
8.0000 | INHALATION_SPRAY | Freq: Once | RESPIRATORY_TRACT | Status: AC
  Administered 2019-05-13: 23:00:00 8 via RESPIRATORY_TRACT
  Filled 2019-05-13: qty 6.7

## 2019-05-13 MED ORDER — IPRATROPIUM BROMIDE 0.02 % IN SOLN: 2.5000 mL | Freq: Once | RESPIRATORY_TRACT | Status: DC

## 2019-05-13 MED ORDER — LIDOCAINE HCL URETHRAL/MUCOSAL 2 % EX GEL
1.0000 "application " | Freq: Once | CUTANEOUS | Status: AC | PRN
  Administered 2019-05-14: 1 via URETHRAL
  Filled 2019-05-13: qty 5

## 2019-05-13 NOTE — ED Provider Notes (Signed)
[facility] EMERGENCY DEPARTMENT Provider Note   CSN: [id number] Arrival date & time: 05/13/19  9562     History   Chief Complaint Chief Complaint  Patient presents with  . Urinary Retention    self cath at home    HPI [name] is a 84 y.o. male with history of hypertension, MI, COPD, GERD, anticoagulation on warfarin, prostate cancer with self-catheterization at home who presents with urinary retention.  Patient reports he tried to self cath earlier today and was only getting clots.  He has some fullness and suprapubic pressure.  He reports he was able to go a little bit on his own this morning and this afternoon, but only a small amount.  Patient denies any fevers, abdominal pain other than as above, chest pain, new shortness of breath, nausea, vomiting.     HPI  Past Medical History:  Diagnosis Date  . Anemia   . Angina   . Anticoagulated on warfarin 07/17/2011  . Arthritis   . Asthma   . Atherosclerosis of native arteries of the extremities with intermittent claudication 08/13/2011  . Blood transfusion 3 weeks ago  . CAD 04/23/2009  . CHF (congestive heart failure) ([place])   . Chronic kidney disease   . COPD (chronic obstructive pulmonary disease) ([place])   . GERD (gastroesophageal reflux disease)   . Heart attack [facility]) October 27,2012 and July 28, 2011  . Hypertension   . Myocardial infarction ([place])   . Prostate cancer ([place])   . S/P ICD (internal cardiac defibrillator) procedure, 6.17.13 02/10/2012  . Ventricular thrombus following MI (myocardial infarction) ([place]) 11/12   on coumadin    Patient Active Problem List   Diagnosis Date Noted  . Lower GI bleed   . Rectal bleeding 03/22/2019  . Acute hypoxemic respiratory failure ([place]) 11/03/2018  . Acute metabolic encephalopathy 13/03/6577  . Acute lower UTI 11/03/2018  . Cardiomyopathy, ischemic 07/15/2015  . Chronic obstructive pulmonary disease ([place])   . 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 Viral bronchitis 06/11/2015  . Obesity 05/31/2015  . Chronic hypoxemic respiratory failure ([place]) 05/31/2015  . COPD exacerbation ([place]) 05/30/2015  . Chronic respiratory failure ([place]) 11/23/2014  . S/P MDT ICD  6.17.13, with revision 02/10/12 for lead dislodgement 02/10/2012  . Atherosclerosis of native arteries of the extremities with intermittent claudication 08/13/2011  . Anticoagulated on warfarin 07/17/2011  . Left Ventricular thrombus s/p Anterior STEMI Oct 2012, Rx'd with Coumadin 07/02/2011  . Recent Anterior ST elevation (STEMI) myocardial infarction involving LAD, 05/2011 06/29/2011    Class: Status post  . Chronic systolic congestive heart failure 06/29/2011    Class: Acute  . CKD (chronic kidney disease) stage 3, GFR 30-59 ml/min (HCC) 06/29/2011  . COPD (chronic obstructive pulmonary disease) ([place]) 06/29/2011    Class: Chronic  . Prostate cancer ([place]) 06/29/2011    Class: Chronic  . CARDIOMYOPATHY, ISCHEMIC 05/08/2009  . DYSLIPIDEMIA 04/23/2009  . Essential hypertension 04/23/2009  . Coronary atherosclerosis 04/23/2009    Past Surgical History:  Procedure Laterality Date  . CARDIAC CATHETERIZATION    . COLONOSCOPY N/A 03/23/2019   Procedure: COLONOSCOPY;  Surgeon: Daneil Dolin, MD;  Location[facility] ENDO SUITE;  Service: Endoscopy;  Laterality: N/A;  . CORONARY ANGIOGRAM N/A 07/28/2011   Procedure: CORONARY ANGIOGRAM;  Surgeon: Leonie Man, MD;  Location: [facility] CATH LAB;  Service: Cardiovascular;  Laterality: N/A;  . CORONARY ANGIOPLASTY    . CORONARY STENT PLACEMENT  06/21/11  . FLEXIBLE  SIGMOIDOSCOPY N/A 03/22/2019   Procedure: FLEXIBLE SIGMOIDOSCOPY;  Surgeon: Danie Binder, MD;  Location: [facility] ENDO SUITE;  Service: Endoscopy;  Laterality: N/A;  . GROIN DEBRIDEMENT  07/18/2011   Procedure: [name] DEBRIDEMENT;  Surgeon: Angelia Mould, MD;  Location: [facility] OR;  Service: Vascular;  Laterality: Right;  exploration of right groin,evacuation of right  groin hematoma & repair of right superficial femoral artery  . IMPLANTABLE CARDIOVERTER DEFIBRILLATOR IMPLANT N/A 02/09/2012   Procedure: IMPLANTABLE CARDIOVERTER DEFIBRILLATOR IMPLANT;  Surgeon: Sanda Klein, MD;  Location: [place] CATH LAB;  Service: Cardiovascular;  Laterality: N/A;  . INSERT / REPLACE / REMOVE PACEMAKER  02/09/2012   ICD  . LEAD REVISION N/A 02/10/2012   Procedure: LEAD REVISION;  Surgeon: Sanda Klein, MD;  Location: [place] CATH LAB;  Service: Cardiovascular;  Laterality: N/A;  . PERCUTANEOUS CORONARY STENT INTERVENTION (PCI-S) N/A 07/28/2011   Procedure: PERCUTANEOUS CORONARY STENT INTERVENTION (PCI-S);  Surgeon: Leonie Man, MD;  Location: [facility] CATH LAB;  Service: Cardiovascular;  Laterality: N/A;  . POLYPECTOMY  03/23/2019   Procedure: POLYPECTOMY;  Surgeon: Daneil Dolin, MD;  Location[facility];  Service: Endoscopy;;  cecum, transverse, descending        Home Medications    Prior to Admission medications   Medication Sig Start Date End Date Taking? Authorizing Provider  aspirin 81 MG tablet Take 81 mg by mouth daily.    [provider]  bisoprolol (ZEBETA) 5 MG tablet Take 0.5 tablets (2.5 mg total) by mouth daily. 05/29/15   Troy Sine, MD  carisoprodol (SOMA) 350 MG tablet Take 350 mg by mouth 4 (four) times daily as needed for muscle spasms.    [provider]  chlorpheniramine (CHLOR-TRIMETON) 4 MG tablet Take 4 mg by mouth 2 (two) times daily.    [provider]  diltiazem (CARDIZEM SR) 120 MG 12 hr capsule Take 120 mg by mouth 2 (two) times daily.    [provider]  escitalopram (LEXAPRO) 20 MG tablet Take 20 mg by mouth daily. 10/04/18   [provider]  ferrous sulfate 325 (65 FE) MG tablet Take 325 mg by mouth daily.    [provider]  fluticasone (FLONASE) 50 MCG/ACT nasal spray Place 2 sprays into both nostrils every morning. *Occasional night use    [provider]  furosemide (LASIX)  40 MG tablet Take 40 mg by mouth daily as needed for fluid.     [provider]  gabapentin (NEURONTIN) 600 MG tablet Take 600 mg by mouth 2 (two) times daily.     [provider]  HYDROcodone-acetaminophen (NORCO) 10-325 MG tablet Take 1 tablet by mouth 4 (four) times daily as needed. pain 01/28/13   [provider]  ipratropium (ATROVENT) 0.03 % nasal spray Place 1 spray into both nostrils every 12 (twelve) hours.    [provider]  ipratropium-albuterol (DUONEB) 0.5-2.5 (3) MG/3ML SOLN Take 3 mLs by nebulization 4 (four) times daily.     [provider]  levalbuterol Penne Lash HFA) 45 MCG/ACT inhaler Inhale into the lungs every 4 (four) hours as needed for wheezing.    [provider]  levalbuterol Penne Lash) 0.63 MG/3ML nebulizer solution Take 1 ampule by nebulization 4 (four) times daily. For shortness of breath    [provider]  montelukast (SINGULAIR) 10 MG tablet Take 10 mg by mouth daily.      [provider]  Multiple Vitamin (MULTI-VITAMINS) TABS Take 1 tablet by mouth 2 (two) times  daily.    [provider]  naproxen sodium (ALEVE) 220 MG tablet Take 220 mg by mouth as needed.    [provider]  nitroGLYCERIN (NITROSTAT) 0.4 MG SL tablet Place 0.4 mg under the tongue every 5 (five) minutes as needed. For chest pain    [provider]  omeprazole (PRILOSEC) 20 MG capsule Take 40 mg by mouth 2 (two) times daily.     [provider]  potassium chloride (MICRO-K) 10 MEQ CR capsule Take 10 mEq by mouth daily.     [provider]  predniSONE (DELTASONE) 10 MG tablet Take 1 tablet (10 mg total) by mouth daily. 11/10/18   Manuella Ghazi, Pratik D, DO  rosuvastatin (CRESTOR) 40 MG tablet Take 40 mg by mouth daily.    [provider]  terazosin (HYTRIN) 2 MG capsule Take 2 mg by mouth at bedtime.     [provider]  triamcinolone cream (KENALOG) 0.5 % Apply 1 application  topically 2 (two) times daily as needed (for skin irritation).     [provider]    Family History Family History  Problem Relation Age of Onset  . Diabetes type II Mother   . Coronary artery disease Mother   . Heart attack Mother   . Coronary artery disease Father   . Diabetes type II Brother   . Coronary artery disease Brother   . Hypertension Sister   . Craniosynostosis Neg Hx   . Stroke Neg Hx   . Colon cancer Neg Hx     Social History Social History   Tobacco Use  . Smoking status: Former Smoker    Packs/day: 3.00    Years: 40.00    Pack years: 120.00    Types: Cigarettes    Quit date: 04/13/2005    Years since quitting: 14.0  . Smokeless tobacco: Never Used  Substance Use Topics  . Alcohol use: Yes    Comment: very rarely; 1-2 beers a year  . Drug use: Never     Allergies   Demerol and Zolpidem tartrate   Review of Systems Review of Systems  Constitutional: Negative for chills and fever.  HENT: Negative for facial swelling and sore throat.   Respiratory: Negative for shortness of breath.   Cardiovascular: Negative for chest pain.  Gastrointestinal: Negative for abdominal pain, nausea and vomiting.  Genitourinary: Positive for dysuria, hematuria (clots) and penile pain (burning). Negative for flank pain.  Musculoskeletal: Negative for back pain.  Skin: Negative for rash and wound.  Neurological: Negative for headaches.  Psychiatric/Behavioral: The patient is not nervous/anxious.      Physical Exam Updated Vital Signs BP 118/75 (BP Location: Right Arm)   Pulse 79   Temp 98.7 F (37.1 C) (Oral)   Resp (!) 24   Ht 5\' 7"  (1.702 m)   Wt 95.7 kg   SpO2 97%   BMI 33.05 kg/m   Physical Exam Vitals signs and nursing note reviewed. Exam conducted with a chaperone present.  Constitutional:      General: He is not in acute distress.    Appearance: He is well-developed. He is not diaphoretic.  HENT:     Head: Normocephalic and atraumatic.      Mouth/Throat:     Pharynx: No oropharyngeal exudate.  Eyes:     General: No scleral icterus.       Right eye: No discharge.        Left eye: No discharge.     Conjunctiva/sclera: Conjunctivae normal.  Pupils: Pupils are equal, round, and reactive to light.  Neck:     Musculoskeletal: Normal range of motion and neck supple.     Thyroid: No thyromegaly.  Cardiovascular:     Rate and Rhythm: Normal rate and regular rhythm.     Heart sounds: Normal heart sounds. No murmur. No friction rub. No gallop.   Pulmonary:     Effort: Pulmonary effort is normal. No respiratory distress.     Breath sounds: Normal breath sounds. No stridor. No wheezing or rales.  Abdominal:     General: Bowel sounds are normal. There is no distension.     Palpations: Abdomen is soft.     Tenderness: There is no abdominal tenderness. There is no right CVA tenderness, left CVA tenderness, guarding or rebound.  Genitourinary:    Scrotum/Testes: Normal.     Comments: No swelling or tenderness to the scrotum; small clot seen at the glans, but not the meatus Lymphadenopathy:     Cervical: No cervical adenopathy.  Skin:    General: Skin is warm and dry.     Coloration: Skin is not pale.     Findings: No rash.  Neurological:     Mental Status: He is alert.     Coordination: Coordination normal.      ED Treatments / Results  Labs (all labs ordered are listed, but only abnormal results are displayed) Labs Reviewed  URINALYSIS, ROUTINE W REFLEX MICROSCOPIC    EKG None  Radiology No results found.  Procedures Procedures (including critical care time)  Medications Ordered in ED Medications  albuterol (VENTOLIN HFA) 108 (90 Base) MCG/ACT inhaler 8 puff (8 puffs Inhalation Given 05/13/19 2235)     Initial Impression / Assessment and Plan / ED Course  I have reviewed the triage vital signs and the nursing notes.  Pertinent labs & imaging results that were available during my care of the patient  were reviewed by me and considered in my medical decision making (see chart for details).        Patient with urinary retention with clots.  He was unable to self cath at home.  Two nurses unable to pass a 16 coud and we do not have a smaller catheter.  I discussed patient case with Dr. Jeffie Pollock on call for urology who advised patient could come to [facility] or go to [facility] where his urologist is.  Patient initially opted to go to [facility], however Dr. Lendell Caprice with [facility] urology requested that he be evaluated by a urologist first before transfer.  Patient will proceed to [facility] long ED.  I discussed patient case with Dr. Eulis Foster at [facility] long ED who accepts patient for transfer for POV.  Patient requesting albuterol inhaler prior to discharge as he was due for nebulizer at 6 PM.  This was given and patient was discharged with his daughter to take him to [facility] ED.  I appreciate the above consultants for their assistance with the patient.  Patient discharged in satisfactory condition.  Final Clinical Impressions(s) / ED Diagnoses   Final diagnoses:  Urinary retention    ED Discharge Orders    None       Frederica Kuster, PA-C 05/13/19 2332    Francine Graven, DO 05/14/19 1601

## 2019-05-13 NOTE — ED Triage Notes (Signed)
Pt reports hx of prostate CA, since then has had problems urinating and self cath's at home. Pt last able to cath yesterday successfully, today pt noticed blood clots and cannot pass urine. Pt reports abd pain and pressure with need to urinate. No fever.

## 2019-05-13 NOTE — Discharge Instructions (Addendum)
Follow-up with Dr. Rosana Hoes next week for evaluation of your Foley catheter.  Take your medications as prescribed.  Return to the ED with worsening pain, fever, catheter not draining, vomiting or any concerns.

## 2019-05-13 NOTE — ED Notes (Signed)
Attempted to insert foley catheter x 2 nurses unsuccessful. Patient states "I have this problem all the time. Last time it took 11 people to try to get it in." Able to insert cathter but will not feed all the way in with no urine return. Patient has pain during attempt of inserting catheter.

## 2019-05-14 ENCOUNTER — Emergency Department (HOSPITAL_COMMUNITY): Payer: No Typology Code available for payment source

## 2019-05-14 DIAGNOSIS — R339 Retention of urine, unspecified: Secondary | ICD-10-CM | POA: Diagnosis not present

## 2019-05-14 LAB — CBC WITH DIFFERENTIAL/PLATELET
Abs Immature Granulocytes: 0.04 10*3/uL (ref 0.00–0.07)
Basophils Absolute: 0 10*3/uL (ref 0.0–0.1)
Basophils Relative: 0 %
Eosinophils Absolute: 0.8 10*3/uL — ABNORMAL HIGH (ref 0.0–0.5)
Eosinophils Relative: 8 %
HCT: 30.7 % — ABNORMAL LOW (ref 39.0–52.0)
Hemoglobin: 9.5 g/dL — ABNORMAL LOW (ref 13.0–17.0)
Immature Granulocytes: 0 %
Lymphocytes Relative: 14 %
Lymphs Abs: 1.4 10*3/uL (ref 0.7–4.0)
MCH: 29.5 pg (ref 26.0–34.0)
MCHC: 30.9 g/dL (ref 30.0–36.0)
MCV: 95.3 fL (ref 80.0–100.0)
Monocytes Absolute: 1.3 10*3/uL — ABNORMAL HIGH (ref 0.1–1.0)
Monocytes Relative: 13 %
Neutro Abs: 6.4 10*3/uL (ref 1.7–7.7)
Neutrophils Relative %: 65 %
Platelets: 308 10*3/uL (ref 150–400)
RBC: 3.22 MIL/uL — ABNORMAL LOW (ref 4.22–5.81)
RDW: 12.3 % (ref 11.5–15.5)
WBC: 9.8 10*3/uL (ref 4.0–10.5)
nRBC: 0 % (ref 0.0–0.2)

## 2019-05-14 LAB — URINALYSIS, ROUTINE W REFLEX MICROSCOPIC
Bilirubin Urine: NEGATIVE
Glucose, UA: NEGATIVE mg/dL
Ketones, ur: NEGATIVE mg/dL
Leukocytes,Ua: NEGATIVE
Nitrite: NEGATIVE
Protein, ur: 100 mg/dL — AB
RBC / HPF: >50 RBC/hpf — ABNORMAL HIGH (ref 0–5)
Specific Gravity, Urine: 1.023 (ref 1.005–1.030)
pH: 5 (ref 5.0–8.0)

## 2019-05-14 LAB — BASIC METABOLIC PANEL
Anion gap: 11 (ref 5–15)
BUN: 22 mg/dL (ref 8–23)
CO2: 28 mmol/L (ref 22–32)
Calcium: 8.3 mg/dL — ABNORMAL LOW (ref 8.9–10.3)
Chloride: 99 mmol/L (ref 98–111)
Creatinine, Ser: 1.8 mg/dL — ABNORMAL HIGH (ref 0.61–1.24)
GFR calc Af Amer: 40 mL/min — ABNORMAL LOW (ref >60)
GFR calc non Af Amer: 34 mL/min — ABNORMAL LOW (ref >60)
Glucose, Bld: 140 mg/dL — ABNORMAL HIGH (ref 70–99)
Potassium: 4.1 mmol/L (ref 3.5–5.1)
Sodium: 138 mmol/L (ref 135–145)

## 2019-05-14 LAB — PROTIME-INR
INR: 1.1 (ref 0.8–1.2)
Prothrombin Time: 14.1 seconds (ref 11.4–15.2)

## 2019-05-14 NOTE — ED Notes (Signed)
Urine amber/blood tinged-specimen to lab

## 2019-05-14 NOTE — ED Provider Notes (Signed)
Patient transferred from [facility] with urinary retention, unable to place catheter.  Patient normally caths for a history of prostate cancer and was able to get only clots.  His bladder scan shows 66.  No fever or vomiting.  Nursing was able to place a 16 [place] coud catheter that was able to drain fairly well.  Patient feels improved.  Discussed with urology Dr. Graylon Good recommends irrigation to ensure catheter is draining appropriately and in the right place.  Labs show stable hemoglobin.  Stable creatinine.  Foley irrigates well.  INR noted to be normal. US shows no hydronephrosis and decompressed bladder.   Family states he no longer takes Coumadin.  He feels his breathing is at baseline.  No chest pain or shortness of breath.  Patient known to Dr. Rosana Hoes of [facility] urology.  He will follow-up with him next week.  Return precautions discussed.  BP 108/64   Pulse 63   Temp 98.1 F (36.7 C) (Oral)   Resp 18   Ht 5\' 7"  (1.702 m)   Wt 95.7 kg   SpO2 99%   BMI 33.05 kg/m     Ezequiel Essex, MD 05/14/19 [phone]

## 2019-05-14 NOTE — ED Notes (Signed)
Foley irrigates easily-light bloody urine returned

## 2019-05-15 LAB — URINE CULTURE: Culture: <10000 — AB

## 2019-05-17 DIAGNOSIS — R0602 Shortness of breath: Secondary | ICD-10-CM | POA: Diagnosis not present

## 2019-05-17 DIAGNOSIS — J9692 Respiratory failure, unspecified with hypercapnia: Secondary | ICD-10-CM | POA: Diagnosis not present

## 2019-05-17 DIAGNOSIS — R29810 Facial weakness: Secondary | ICD-10-CM | POA: Diagnosis not present

## 2019-05-17 DIAGNOSIS — J441 Chronic obstructive pulmonary disease with (acute) exacerbation: Secondary | ICD-10-CM | POA: Diagnosis not present

## 2019-05-17 DIAGNOSIS — I509 Heart failure, unspecified: Secondary | ICD-10-CM | POA: Diagnosis not present

## 2019-05-17 DIAGNOSIS — N390 Urinary tract infection, site not specified: Secondary | ICD-10-CM | POA: Diagnosis not present

## 2019-05-17 DIAGNOSIS — R410 Disorientation, unspecified: Secondary | ICD-10-CM | POA: Diagnosis not present

## 2019-05-17 DIAGNOSIS — R29818 Other symptoms and signs involving the nervous system: Secondary | ICD-10-CM | POA: Diagnosis not present

## 2019-05-17 DIAGNOSIS — R1310 Dysphagia, unspecified: Secondary | ICD-10-CM | POA: Diagnosis not present

## 2019-05-17 DIAGNOSIS — J449 Chronic obstructive pulmonary disease, unspecified: Secondary | ICD-10-CM | POA: Diagnosis not present

## 2019-05-17 DIAGNOSIS — G459 Transient cerebral ischemic attack, unspecified: Secondary | ICD-10-CM | POA: Diagnosis not present

## 2019-05-17 DIAGNOSIS — I6523 Occlusion and stenosis of bilateral carotid arteries: Secondary | ICD-10-CM | POA: Diagnosis not present

## 2019-05-17 DIAGNOSIS — R404 Transient alteration of awareness: Secondary | ICD-10-CM | POA: Diagnosis not present

## 2019-05-17 DIAGNOSIS — R9431 Abnormal electrocardiogram [ECG] [EKG]: Secondary | ICD-10-CM | POA: Diagnosis not present

## 2019-05-18 DIAGNOSIS — E782 Mixed hyperlipidemia: Secondary | ICD-10-CM | POA: Diagnosis not present

## 2019-05-18 DIAGNOSIS — J9612 Chronic respiratory failure with hypercapnia: Secondary | ICD-10-CM | POA: Diagnosis not present

## 2019-05-18 DIAGNOSIS — R69 Illness, unspecified: Secondary | ICD-10-CM | POA: Diagnosis not present

## 2019-05-18 DIAGNOSIS — I2510 Atherosclerotic heart disease of native coronary artery without angina pectoris: Secondary | ICD-10-CM | POA: Diagnosis not present

## 2019-05-18 DIAGNOSIS — K219 Gastro-esophageal reflux disease without esophagitis: Secondary | ICD-10-CM | POA: Diagnosis not present

## 2019-05-18 DIAGNOSIS — G9341 Metabolic encephalopathy: Secondary | ICD-10-CM | POA: Diagnosis not present

## 2019-05-18 DIAGNOSIS — B952 Enterococcus as the cause of diseases classified elsewhere: Secondary | ICD-10-CM | POA: Diagnosis not present

## 2019-05-18 DIAGNOSIS — I252 Old myocardial infarction: Secondary | ICD-10-CM | POA: Diagnosis not present

## 2019-05-18 DIAGNOSIS — N189 Chronic kidney disease, unspecified: Secondary | ICD-10-CM | POA: Diagnosis not present

## 2019-05-18 DIAGNOSIS — R404 Transient alteration of awareness: Secondary | ICD-10-CM | POA: Diagnosis not present

## 2019-05-18 DIAGNOSIS — I6523 Occlusion and stenosis of bilateral carotid arteries: Secondary | ICD-10-CM | POA: Diagnosis not present

## 2019-05-18 DIAGNOSIS — J9610 Chronic respiratory failure, unspecified whether with hypoxia or hypercapnia: Secondary | ICD-10-CM | POA: Diagnosis not present

## 2019-05-18 DIAGNOSIS — R9431 Abnormal electrocardiogram [ECG] [EKG]: Secondary | ICD-10-CM | POA: Diagnosis not present

## 2019-05-18 DIAGNOSIS — I5032 Chronic diastolic (congestive) heart failure: Secondary | ICD-10-CM | POA: Diagnosis not present

## 2019-05-18 DIAGNOSIS — Z87891 Personal history of nicotine dependence: Secondary | ICD-10-CM | POA: Diagnosis not present

## 2019-05-18 DIAGNOSIS — N390 Urinary tract infection, site not specified: Secondary | ICD-10-CM | POA: Diagnosis not present

## 2019-05-18 DIAGNOSIS — T83511A Infection and inflammatory reaction due to indwelling urethral catheter, initial encounter: Secondary | ICD-10-CM | POA: Diagnosis not present

## 2019-05-18 DIAGNOSIS — J441 Chronic obstructive pulmonary disease with (acute) exacerbation: Secondary | ICD-10-CM | POA: Diagnosis not present

## 2019-05-18 DIAGNOSIS — R338 Other retention of urine: Secondary | ICD-10-CM | POA: Diagnosis not present

## 2019-05-18 DIAGNOSIS — Z95810 Presence of automatic (implantable) cardiac defibrillator: Secondary | ICD-10-CM | POA: Diagnosis not present

## 2019-05-18 DIAGNOSIS — U071 COVID-19: Secondary | ICD-10-CM | POA: Diagnosis not present

## 2019-05-18 DIAGNOSIS — Z9981 Dependence on supplemental oxygen: Secondary | ICD-10-CM | POA: Diagnosis not present

## 2019-05-18 DIAGNOSIS — I509 Heart failure, unspecified: Secondary | ICD-10-CM | POA: Diagnosis not present

## 2019-05-18 DIAGNOSIS — I10 Essential (primary) hypertension: Secondary | ICD-10-CM | POA: Diagnosis not present

## 2019-05-18 DIAGNOSIS — Z7401 Bed confinement status: Secondary | ICD-10-CM | POA: Diagnosis not present

## 2019-05-18 DIAGNOSIS — R5381 Other malaise: Secondary | ICD-10-CM | POA: Diagnosis not present

## 2019-05-18 DIAGNOSIS — Z7951 Long term (current) use of inhaled steroids: Secondary | ICD-10-CM | POA: Diagnosis not present

## 2019-05-18 DIAGNOSIS — R29818 Other symptoms and signs involving the nervous system: Secondary | ICD-10-CM | POA: Diagnosis not present

## 2019-05-18 DIAGNOSIS — I5089 Other heart failure: Secondary | ICD-10-CM | POA: Diagnosis not present

## 2019-05-18 DIAGNOSIS — R0602 Shortness of breath: Secondary | ICD-10-CM | POA: Diagnosis not present

## 2019-05-18 DIAGNOSIS — Z87442 Personal history of urinary calculi: Secondary | ICD-10-CM | POA: Diagnosis not present

## 2019-05-23 ENCOUNTER — Ambulatory Visit: Payer: Medicare Other | Admitting: Cardiovascular Disease

## 2019-05-25 DIAGNOSIS — I509 Heart failure, unspecified: Secondary | ICD-10-CM | POA: Diagnosis not present

## 2019-05-25 DIAGNOSIS — E782 Mixed hyperlipidemia: Secondary | ICD-10-CM | POA: Diagnosis not present

## 2019-05-25 DIAGNOSIS — I10 Essential (primary) hypertension: Secondary | ICD-10-CM | POA: Diagnosis not present

## 2019-05-25 DIAGNOSIS — N189 Chronic kidney disease, unspecified: Secondary | ICD-10-CM | POA: Diagnosis not present

## 2019-06-02 DIAGNOSIS — N390 Urinary tract infection, site not specified: Secondary | ICD-10-CM | POA: Diagnosis not present

## 2019-06-02 DIAGNOSIS — K219 Gastro-esophageal reflux disease without esophagitis: Secondary | ICD-10-CM | POA: Diagnosis not present

## 2019-06-02 DIAGNOSIS — J449 Chronic obstructive pulmonary disease, unspecified: Secondary | ICD-10-CM | POA: Diagnosis not present

## 2019-06-02 DIAGNOSIS — J9612 Chronic respiratory failure with hypercapnia: Secondary | ICD-10-CM | POA: Diagnosis not present

## 2019-06-06 DIAGNOSIS — J439 Emphysema, unspecified: Secondary | ICD-10-CM | POA: Diagnosis not present

## 2019-06-06 DIAGNOSIS — U071 COVID-19: Secondary | ICD-10-CM | POA: Diagnosis not present

## 2019-06-06 DIAGNOSIS — I4510 Unspecified right bundle-branch block: Secondary | ICD-10-CM | POA: Diagnosis not present

## 2019-06-06 DIAGNOSIS — R0602 Shortness of breath: Secondary | ICD-10-CM | POA: Diagnosis not present

## 2019-06-06 DIAGNOSIS — Z95810 Presence of automatic (implantable) cardiac defibrillator: Secondary | ICD-10-CM | POA: Diagnosis not present

## 2019-06-06 DIAGNOSIS — J449 Chronic obstructive pulmonary disease, unspecified: Secondary | ICD-10-CM | POA: Diagnosis not present

## 2019-06-06 DIAGNOSIS — I252 Old myocardial infarction: Secondary | ICD-10-CM | POA: Diagnosis not present

## 2019-06-06 DIAGNOSIS — N189 Chronic kidney disease, unspecified: Secondary | ICD-10-CM | POA: Diagnosis not present

## 2019-06-06 DIAGNOSIS — Z87891 Personal history of nicotine dependence: Secondary | ICD-10-CM | POA: Diagnosis not present

## 2019-06-06 DIAGNOSIS — I509 Heart failure, unspecified: Secondary | ICD-10-CM | POA: Diagnosis not present

## 2019-06-06 DIAGNOSIS — Z955 Presence of coronary angioplasty implant and graft: Secondary | ICD-10-CM | POA: Diagnosis not present

## 2019-06-06 DIAGNOSIS — K219 Gastro-esophageal reflux disease without esophagitis: Secondary | ICD-10-CM | POA: Diagnosis not present

## 2019-06-06 DIAGNOSIS — Z79899 Other long term (current) drug therapy: Secondary | ICD-10-CM | POA: Diagnosis not present

## 2019-06-06 DIAGNOSIS — J9612 Chronic respiratory failure with hypercapnia: Secondary | ICD-10-CM | POA: Diagnosis not present

## 2019-06-08 DIAGNOSIS — J449 Chronic obstructive pulmonary disease, unspecified: Secondary | ICD-10-CM | POA: Diagnosis not present

## 2019-06-08 DIAGNOSIS — R404 Transient alteration of awareness: Secondary | ICD-10-CM | POA: Diagnosis not present

## 2019-06-08 DIAGNOSIS — N3001 Acute cystitis with hematuria: Secondary | ICD-10-CM | POA: Diagnosis not present

## 2019-06-08 DIAGNOSIS — I252 Old myocardial infarction: Secondary | ICD-10-CM | POA: Diagnosis not present

## 2019-06-08 DIAGNOSIS — A419 Sepsis, unspecified organism: Secondary | ICD-10-CM | POA: Diagnosis not present

## 2019-06-08 DIAGNOSIS — J80 Acute respiratory distress syndrome: Secondary | ICD-10-CM | POA: Diagnosis not present

## 2019-06-08 DIAGNOSIS — R4020 Unspecified coma: Secondary | ICD-10-CM | POA: Diagnosis not present

## 2019-06-08 DIAGNOSIS — K219 Gastro-esophageal reflux disease without esophagitis: Secondary | ICD-10-CM | POA: Diagnosis not present

## 2019-06-08 DIAGNOSIS — N186 End stage renal disease: Secondary | ICD-10-CM | POA: Diagnosis not present

## 2019-06-08 DIAGNOSIS — I509 Heart failure, unspecified: Secondary | ICD-10-CM | POA: Diagnosis not present

## 2019-06-08 DIAGNOSIS — U071 COVID-19: Secondary | ICD-10-CM | POA: Diagnosis not present

## 2019-06-08 DIAGNOSIS — R0689 Other abnormalities of breathing: Secondary | ICD-10-CM | POA: Diagnosis not present

## 2019-06-08 DIAGNOSIS — Z87891 Personal history of nicotine dependence: Secondary | ICD-10-CM | POA: Diagnosis not present

## 2019-06-08 DIAGNOSIS — J9601 Acute respiratory failure with hypoxia: Secondary | ICD-10-CM | POA: Diagnosis not present

## 2019-06-08 DIAGNOSIS — Z79899 Other long term (current) drug therapy: Secondary | ICD-10-CM | POA: Diagnosis not present

## 2019-06-08 DIAGNOSIS — R0902 Hypoxemia: Secondary | ICD-10-CM | POA: Diagnosis not present

## 2019-06-08 DIAGNOSIS — Z955 Presence of coronary angioplasty implant and graft: Secondary | ICD-10-CM | POA: Diagnosis not present

## 2019-06-09 ENCOUNTER — Inpatient Hospital Stay (HOSPITAL_COMMUNITY): Payer: Medicare Other

## 2019-06-09 ENCOUNTER — Inpatient Hospital Stay (HOSPITAL_COMMUNITY)
Admission: AD | Admit: 2019-06-09 | Discharge: 2019-06-19 | DRG: 208 | Disposition: A | Discharge status: Skilled Nursing Facility | Payer: Medicare Other | Source: Other Acute Inpatient Hospital | Attending: Internal Medicine | Admitting: Internal Medicine

## 2019-06-09 ENCOUNTER — Encounter (HOSPITAL_COMMUNITY): Payer: Self-pay | Admitting: Internal Medicine

## 2019-06-09 DIAGNOSIS — Z7982 Long term (current) use of aspirin: Secondary | ICD-10-CM

## 2019-06-09 DIAGNOSIS — Z23 Encounter for immunization: Secondary | ICD-10-CM

## 2019-06-09 DIAGNOSIS — Z4682 Encounter for fitting and adjustment of non-vascular catheter: Secondary | ICD-10-CM | POA: Diagnosis not present

## 2019-06-09 DIAGNOSIS — N390 Urinary tract infection, site not specified: Secondary | ICD-10-CM | POA: Diagnosis present

## 2019-06-09 DIAGNOSIS — I2510 Atherosclerotic heart disease of native coronary artery without angina pectoris: Secondary | ICD-10-CM | POA: Diagnosis not present

## 2019-06-09 DIAGNOSIS — N186 End stage renal disease: Secondary | ICD-10-CM | POA: Diagnosis not present

## 2019-06-09 DIAGNOSIS — Z0189 Encounter for other specified special examinations: Secondary | ICD-10-CM

## 2019-06-09 DIAGNOSIS — Z86010 Personal history of colonic polyps: Secondary | ICD-10-CM

## 2019-06-09 DIAGNOSIS — E870 Hyperosmolality and hypernatremia: Secondary | ICD-10-CM | POA: Diagnosis not present

## 2019-06-09 DIAGNOSIS — Z87891 Personal history of nicotine dependence: Secondary | ICD-10-CM | POA: Diagnosis not present

## 2019-06-09 DIAGNOSIS — K219 Gastro-esophageal reflux disease without esophagitis: Secondary | ICD-10-CM | POA: Diagnosis not present

## 2019-06-09 DIAGNOSIS — Z7952 Long term (current) use of systemic steroids: Secondary | ICD-10-CM

## 2019-06-09 DIAGNOSIS — E876 Hypokalemia: Secondary | ICD-10-CM | POA: Diagnosis not present

## 2019-06-09 DIAGNOSIS — E785 Hyperlipidemia, unspecified: Secondary | ICD-10-CM | POA: Diagnosis not present

## 2019-06-09 DIAGNOSIS — Z8249 Family history of ischemic heart disease and other diseases of the circulatory system: Secondary | ICD-10-CM

## 2019-06-09 DIAGNOSIS — N3001 Acute cystitis with hematuria: Secondary | ICD-10-CM | POA: Diagnosis not present

## 2019-06-09 DIAGNOSIS — U071 COVID-19: Secondary | ICD-10-CM | POA: Diagnosis not present

## 2019-06-09 DIAGNOSIS — J440 Chronic obstructive pulmonary disease with acute lower respiratory infection: Secondary | ICD-10-CM | POA: Diagnosis not present

## 2019-06-09 DIAGNOSIS — I70219 Atherosclerosis of native arteries of extremities with intermittent claudication, unspecified extremity: Secondary | ICD-10-CM | POA: Diagnosis present

## 2019-06-09 DIAGNOSIS — I10 Essential (primary) hypertension: Secondary | ICD-10-CM

## 2019-06-09 DIAGNOSIS — J9811 Atelectasis: Secondary | ICD-10-CM | POA: Diagnosis present

## 2019-06-09 DIAGNOSIS — Z79899 Other long term (current) drug therapy: Secondary | ICD-10-CM | POA: Diagnosis not present

## 2019-06-09 DIAGNOSIS — Z955 Presence of coronary angioplasty implant and graft: Secondary | ICD-10-CM

## 2019-06-09 DIAGNOSIS — Z8546 Personal history of malignant neoplasm of prostate: Secondary | ICD-10-CM

## 2019-06-09 DIAGNOSIS — I509 Heart failure, unspecified: Secondary | ICD-10-CM | POA: Diagnosis not present

## 2019-06-09 DIAGNOSIS — R918 Other nonspecific abnormal finding of lung field: Secondary | ICD-10-CM | POA: Diagnosis not present

## 2019-06-09 DIAGNOSIS — I252 Old myocardial infarction: Secondary | ICD-10-CM

## 2019-06-09 DIAGNOSIS — G9340 Encephalopathy, unspecified: Secondary | ICD-10-CM | POA: Diagnosis not present

## 2019-06-09 DIAGNOSIS — N1832 Chronic kidney disease, stage 3b: Secondary | ICD-10-CM | POA: Diagnosis present

## 2019-06-09 DIAGNOSIS — J80 Acute respiratory distress syndrome: Secondary | ICD-10-CM | POA: Diagnosis not present

## 2019-06-09 DIAGNOSIS — I5023 Acute on chronic systolic (congestive) heart failure: Secondary | ICD-10-CM | POA: Diagnosis not present

## 2019-06-09 DIAGNOSIS — J1289 Other viral pneumonia: Secondary | ICD-10-CM

## 2019-06-09 DIAGNOSIS — I130 Hypertensive heart and chronic kidney disease with heart failure and stage 1 through stage 4 chronic kidney disease, or unspecified chronic kidney disease: Secondary | ICD-10-CM | POA: Diagnosis present

## 2019-06-09 DIAGNOSIS — Z931 Gastrostomy status: Secondary | ICD-10-CM

## 2019-06-09 DIAGNOSIS — N183 Chronic kidney disease, stage 3 unspecified: Secondary | ICD-10-CM | POA: Diagnosis present

## 2019-06-09 DIAGNOSIS — Z833 Family history of diabetes mellitus: Secondary | ICD-10-CM

## 2019-06-09 DIAGNOSIS — J438 Other emphysema: Secondary | ICD-10-CM | POA: Diagnosis not present

## 2019-06-09 DIAGNOSIS — A419 Sepsis, unspecified organism: Secondary | ICD-10-CM | POA: Diagnosis not present

## 2019-06-09 DIAGNOSIS — Z888 Allergy status to other drugs, medicaments and biological substances status: Secondary | ICD-10-CM

## 2019-06-09 DIAGNOSIS — J9621 Acute and chronic respiratory failure with hypoxia: Secondary | ICD-10-CM | POA: Diagnosis not present

## 2019-06-09 DIAGNOSIS — Z95810 Presence of automatic (implantable) cardiac defibrillator: Secondary | ICD-10-CM

## 2019-06-09 DIAGNOSIS — N179 Acute kidney failure, unspecified: Secondary | ICD-10-CM | POA: Diagnosis present

## 2019-06-09 DIAGNOSIS — B952 Enterococcus as the cause of diseases classified elsewhere: Secondary | ICD-10-CM

## 2019-06-09 DIAGNOSIS — J9601 Acute respiratory failure with hypoxia: Secondary | ICD-10-CM

## 2019-06-09 DIAGNOSIS — R0902 Hypoxemia: Secondary | ICD-10-CM

## 2019-06-09 DIAGNOSIS — Z66 Do not resuscitate: Secondary | ICD-10-CM | POA: Diagnosis not present

## 2019-06-09 DIAGNOSIS — J431 Panlobular emphysema: Secondary | ICD-10-CM | POA: Diagnosis not present

## 2019-06-09 DIAGNOSIS — Z79891 Long term (current) use of opiate analgesic: Secondary | ICD-10-CM

## 2019-06-09 DIAGNOSIS — I5022 Chronic systolic (congestive) heart failure: Secondary | ICD-10-CM | POA: Diagnosis not present

## 2019-06-09 DIAGNOSIS — J1282 Pneumonia due to coronavirus disease 2019: Secondary | ICD-10-CM | POA: Diagnosis present

## 2019-06-09 DIAGNOSIS — J449 Chronic obstructive pulmonary disease, unspecified: Secondary | ICD-10-CM | POA: Diagnosis present

## 2019-06-09 LAB — POCT I-STAT 7, (LYTES, BLD GAS, ICA,H+H)
Acid-Base Excess: 2 mmol/L (ref 0.0–2.0)
Bicarbonate: 28.2 mmol/L — ABNORMAL HIGH (ref 20.0–28.0)
Calcium, Ion: 1.12 mmol/L — ABNORMAL LOW (ref 1.15–1.40)
HCT: 31 % — ABNORMAL LOW (ref 39.0–52.0)
Hemoglobin: 10.5 g/dL — ABNORMAL LOW (ref 13.0–17.0)
O2 Saturation: 96 %
Patient temperature: 98.9
Potassium: 3.9 mmol/L (ref 3.5–5.1)
Sodium: 150 mmol/L — ABNORMAL HIGH (ref 135–145)
TCO2: 30 mmol/L (ref 22–32)
pCO2 arterial: 52.6 mmHg — ABNORMAL HIGH (ref 32.0–48.0)
pH, Arterial: 7.339 — ABNORMAL LOW (ref 7.350–7.450)
pO2, Arterial: 92 mmHg (ref 83.0–108.0)

## 2019-06-09 LAB — COMPREHENSIVE METABOLIC PANEL
ALT: 25 U/L (ref 0–44)
AST: 30 U/L (ref 15–41)
Albumin: 2.8 g/dL — ABNORMAL LOW (ref 3.5–5.0)
Alkaline Phosphatase: 61 U/L (ref 38–126)
Anion gap: 12 (ref 5–15)
BUN: 44 mg/dL — ABNORMAL HIGH (ref 8–23)
CO2: 29 mmol/L (ref 22–32)
Calcium: 8 mg/dL — ABNORMAL LOW (ref 8.9–10.3)
Chloride: 109 mmol/L (ref 98–111)
Creatinine, Ser: 2.07 mg/dL — ABNORMAL HIGH (ref 0.61–1.24)
GFR calc Af Amer: 34 mL/min — ABNORMAL LOW (ref >60)
GFR calc non Af Amer: 29 mL/min — ABNORMAL LOW (ref >60)
Glucose, Bld: 239 mg/dL — ABNORMAL HIGH (ref 70–99)
Potassium: 4 mmol/L (ref 3.5–5.1)
Sodium: 150 mmol/L — ABNORMAL HIGH (ref 135–145)
Total Bilirubin: 0.9 mg/dL (ref 0.3–1.2)
Total Protein: 6.2 g/dL — ABNORMAL LOW (ref 6.5–8.1)

## 2019-06-09 LAB — MAGNESIUM
Magnesium: 2.3 mg/dL (ref 1.7–2.4)
Magnesium: 2.3 mg/dL (ref 1.7–2.4)

## 2019-06-09 LAB — CBC WITH DIFFERENTIAL/PLATELET
Abs Immature Granulocytes: 0.05 10*3/uL (ref 0.00–0.07)
Basophils Absolute: 0 10*3/uL (ref 0.0–0.1)
Basophils Relative: 0 %
Eosinophils Absolute: 0 10*3/uL (ref 0.0–0.5)
Eosinophils Relative: 0 %
HCT: 36.9 % — ABNORMAL LOW (ref 39.0–52.0)
Hemoglobin: 10.4 g/dL — ABNORMAL LOW (ref 13.0–17.0)
Immature Granulocytes: 1 %
Lymphocytes Relative: 8 %
Lymphs Abs: 0.7 10*3/uL (ref 0.7–4.0)
MCH: 27.4 pg (ref 26.0–34.0)
MCHC: 28.2 g/dL — ABNORMAL LOW (ref 30.0–36.0)
MCV: 97.4 fL (ref 80.0–100.0)
Monocytes Absolute: 0.3 10*3/uL (ref 0.1–1.0)
Monocytes Relative: 4 %
Neutro Abs: 8.2 10*3/uL — ABNORMAL HIGH (ref 1.7–7.7)
Neutrophils Relative %: 87 %
Platelets: 196 10*3/uL (ref 150–400)
RBC: 3.79 MIL/uL — ABNORMAL LOW (ref 4.22–5.81)
RDW: 14.4 % (ref 11.5–15.5)
WBC: 9.3 10*3/uL (ref 4.0–10.5)
nRBC: 0 % (ref 0.0–0.2)

## 2019-06-09 LAB — GLUCOSE, CAPILLARY
Glucose-Capillary: 142 mg/dL — ABNORMAL HIGH (ref 70–99)
Glucose-Capillary: 136 mg/dL — ABNORMAL HIGH (ref 70–99)

## 2019-06-09 LAB — AMMONIA: Ammonia: 22 umol/L (ref 9–35)

## 2019-06-09 LAB — BRAIN NATRIURETIC PEPTIDE: B Natriuretic Peptide: 173.2 pg/mL — ABNORMAL HIGH (ref 0.0–100.0)

## 2019-06-09 LAB — PHOSPHORUS
Phosphorus: 2.8 mg/dL (ref 2.5–4.6)
Phosphorus: 2.6 mg/dL (ref 2.5–4.6)

## 2019-06-09 LAB — TRIGLYCERIDES: Triglycerides: 94 mg/dL (ref <150)

## 2019-06-09 LAB — PROCALCITONIN: Procalcitonin: <0.1 ng/mL

## 2019-06-09 LAB — D-DIMER, QUANTITATIVE: D-Dimer, Quant: 0.55 ug/mL-FEU — ABNORMAL HIGH (ref 0.00–0.50)

## 2019-06-09 LAB — FERRITIN: Ferritin: 356 ng/mL — ABNORMAL HIGH (ref 24–336)

## 2019-06-09 LAB — LACTATE DEHYDROGENASE: LDH: 253 U/L — ABNORMAL HIGH (ref 98–192)

## 2019-06-09 LAB — FIBRINOGEN: Fibrinogen: 682 mg/dL — ABNORMAL HIGH (ref 210–475)

## 2019-06-09 LAB — C-REACTIVE PROTEIN: CRP: 2.9 mg/dL — ABNORMAL HIGH (ref <1.0)

## 2019-06-09 MED ORDER — PANTOPRAZOLE SODIUM 40 MG PO TBEC: 40.0000 mg | DELAYED_RELEASE_TABLET | Freq: Every day | ORAL | Status: DC

## 2019-06-09 MED ORDER — GUAIFENESIN-DM 100-10 MG/5ML PO SYRP
10.0000 mL | ORAL_SOLUTION | ORAL | Status: DC | PRN
  Administered 2019-06-14 – 2019-06-18 (×2): 10 mL via ORAL
  Filled 2019-06-09 (×2): qty 10

## 2019-06-09 MED ORDER — ESCITALOPRAM OXALATE 20 MG PO TABS
20.0000 mg | ORAL_TABLET | Freq: Every day | ORAL | Status: DC
  Administered 2019-06-09 – 2019-06-19 (×10): 20 mg
  Filled 2019-06-09 (×13): qty 1

## 2019-06-09 MED ORDER — PRO-STAT SUGAR FREE PO LIQD
30.0000 mL | Freq: Four times a day (QID) | ORAL | Status: DC
  Filled 2019-06-09: qty 30

## 2019-06-09 MED ORDER — ATORVASTATIN CALCIUM 40 MG PO TABS
80.0000 mg | ORAL_TABLET | Freq: Every day | ORAL | Status: DC
  Administered 2019-06-10 – 2019-06-18 (×8): 80 mg via ORAL
  Filled 2019-06-09 (×9): qty 2

## 2019-06-09 MED ORDER — CHLORHEXIDINE GLUCONATE 0.12% ORAL RINSE (MEDLINE KIT): 15.0000 mL | Freq: Two times a day (BID) | OROMUCOSAL | Status: DC

## 2019-06-09 MED ORDER — FENTANYL CITRATE (PF) 100 MCG/2ML IJ SOLN: 25.0000 ug | INTRAMUSCULAR | Status: DC | PRN

## 2019-06-09 MED ORDER — SODIUM CHLORIDE 0.9 % IV SOLN
2.0000 g | Freq: Three times a day (TID) | INTRAVENOUS | Status: DC
  Filled 2019-06-09 (×2): qty 2000

## 2019-06-09 MED ORDER — PANTOPRAZOLE SODIUM 40 MG PO PACK
40.0000 mg | PACK | Freq: Every day | ORAL | Status: DC
  Administered 2019-06-09: 40 mg
  Filled 2019-06-09 (×2): qty 20

## 2019-06-09 MED ORDER — VITAMIN C 500 MG PO TABS
500.0000 mg | ORAL_TABLET | Freq: Every day | ORAL | Status: DC
  Administered 2019-06-09 – 2019-06-19 (×10): 500 mg via ORAL
  Filled 2019-06-09 (×11): qty 1

## 2019-06-09 MED ORDER — DEXAMETHASONE SODIUM PHOSPHATE 10 MG/ML IJ SOLN
6.0000 mg | INTRAMUSCULAR | Status: AC
  Administered 2019-06-09 – 2019-06-18 (×9): 6 mg via INTRAVENOUS
  Filled 2019-06-09 (×10): qty 1

## 2019-06-09 MED ORDER — NOREPINEPHRINE 4 MG/250ML-% IV SOLN
0.0000 ug/min | INTRAVENOUS | Status: DC
  Administered 2019-06-09 (×2): 2 ug/min via INTRAVENOUS
  Filled 2019-06-09 (×2): qty 250

## 2019-06-09 MED ORDER — VITAL AF 1.2 CAL PO LIQD: 1000.0000 mL | ORAL | Status: DC

## 2019-06-09 MED ORDER — FENTANYL CITRATE (PF) 100 MCG/2ML IJ SOLN
25.0000 ug | INTRAMUSCULAR | Status: DC | PRN
  Administered 2019-06-10: 25 ug via INTRAVENOUS
  Filled 2019-06-09: qty 2

## 2019-06-09 MED ORDER — ADULT MULTIVITAMIN W/MINERALS CH
1.0000 | ORAL_TABLET | Freq: Every day | ORAL | Status: DC
  Administered 2019-06-09 – 2019-06-19 (×10): 1 via ORAL
  Filled 2019-06-09 (×10): qty 1

## 2019-06-09 MED ORDER — LEVOFLOXACIN IN D5W 750 MG/150ML IV SOLN
750.0000 mg | INTRAVENOUS | Status: DC
  Administered 2019-06-09: 750 mg via INTRAVENOUS
  Filled 2019-06-09: qty 150

## 2019-06-09 MED ORDER — ORAL CARE MOUTH RINSE
15.0000 mL | OROMUCOSAL | Status: DC
  Administered 2019-06-09 – 2019-06-10 (×11): 15 mL via OROMUCOSAL

## 2019-06-09 MED ORDER — HYDROCOD POLST-CPM POLST ER 10-8 MG/5ML PO SUER
5.0000 mL | Freq: Two times a day (BID) | ORAL | Status: DC | PRN
  Administered 2019-06-14 – 2019-06-19 (×3): 5 mL via ORAL
  Filled 2019-06-09 (×3): qty 5

## 2019-06-09 MED ORDER — CHLORHEXIDINE GLUCONATE 0.12 % MT SOLN
15.0000 mL | Freq: Two times a day (BID) | OROMUCOSAL | Status: DC
  Administered 2019-06-09 – 2019-06-10 (×4): 15 mL via OROMUCOSAL
  Filled 2019-06-09 (×2): qty 15

## 2019-06-09 MED ORDER — SODIUM CHLORIDE 0.9 % IV SOLN
2.0000 g | Freq: Three times a day (TID) | INTRAVENOUS | Status: DC
  Administered 2019-06-11 – 2019-06-12 (×5): 2 g via INTRAVENOUS
  Filled 2019-06-09 (×3): qty 2
  Filled 2019-06-09 (×3): qty 2000

## 2019-06-09 MED ORDER — ENOXAPARIN SODIUM 40 MG/0.4ML ~~LOC~~ SOLN: 40.0000 mg | SUBCUTANEOUS | Status: DC

## 2019-06-09 MED ORDER — BISACODYL 10 MG RE SUPP
10.0000 mg | Freq: Every day | RECTAL | Status: DC | PRN
  Filled 2019-06-09: qty 1

## 2019-06-09 MED ORDER — SODIUM CHLORIDE 0.9 % IV SOLN
100.0000 mg | INTRAVENOUS | Status: AC
  Administered 2019-06-10 – 2019-06-13 (×4): 100 mg via INTRAVENOUS
  Filled 2019-06-09 (×4): qty 20

## 2019-06-09 MED ORDER — DOCUSATE SODIUM 50 MG/5ML PO LIQD: 100.0000 mg | Freq: Two times a day (BID) | ORAL | Status: DC | PRN

## 2019-06-09 MED ORDER — ASPIRIN 81 MG PO CHEW
81.0000 mg | CHEWABLE_TABLET | Freq: Every day | ORAL | Status: DC
  Administered 2019-06-09 – 2019-06-19 (×10): 81 mg via ORAL
  Filled 2019-06-09 (×11): qty 1

## 2019-06-09 MED ORDER — CHLORHEXIDINE GLUCONATE CLOTH 2 % EX PADS
6.0000 | MEDICATED_PAD | Freq: Every day | CUTANEOUS | Status: DC
  Administered 2019-06-09 – 2019-06-19 (×10): 6 via TOPICAL

## 2019-06-09 MED ORDER — ENOXAPARIN SODIUM 40 MG/0.4ML ~~LOC~~ SOLN
40.0000 mg | Freq: Two times a day (BID) | SUBCUTANEOUS | Status: DC
  Administered 2019-06-09 – 2019-06-14 (×12): 40 mg via SUBCUTANEOUS
  Filled 2019-06-09 (×12): qty 0.4

## 2019-06-09 MED ORDER — PROPOFOL 1000 MG/100ML IV EMUL
5.0000 ug/kg/min | INTRAVENOUS | Status: DC
  Administered 2019-06-09: 5 ug/kg/min via INTRAVENOUS
  Administered 2019-06-10: 20 ug/kg/min via INTRAVENOUS
  Administered 2019-06-10: 35 ug/kg/min via INTRAVENOUS
  Filled 2019-06-09 (×3): qty 100

## 2019-06-09 MED ORDER — VITAL HIGH PROTEIN PO LIQD: 1000.0000 mL | ORAL | Status: DC

## 2019-06-09 MED ORDER — SODIUM CHLORIDE 0.9 % IV SOLN
200.0000 mg | Freq: Once | INTRAVENOUS | Status: AC
  Administered 2019-06-09: 200 mg via INTRAVENOUS
  Filled 2019-06-09: qty 40

## 2019-06-09 MED ORDER — MIDAZOLAM 50MG/50ML (1MG/ML) PREMIX INFUSION
0.5000 mg/h | INTRAVENOUS | Status: DC
  Administered 2019-06-09: 1 mg/h via INTRAVENOUS
  Filled 2019-06-09: qty 50

## 2019-06-09 MED ORDER — PRO-STAT SUGAR FREE PO LIQD
30.0000 mL | Freq: Two times a day (BID) | ORAL | Status: DC
  Administered 2019-06-09: 30 mL
  Filled 2019-06-09: qty 30

## 2019-06-09 MED ORDER — IPRATROPIUM-ALBUTEROL 20-100 MCG/ACT IN AERS
1.0000 | INHALATION_SPRAY | Freq: Four times a day (QID) | RESPIRATORY_TRACT | Status: DC
  Filled 2019-06-09: qty 4

## 2019-06-09 MED ORDER — ACETAMINOPHEN 325 MG PO TABS: 650.0000 mg | ORAL_TABLET | Freq: Four times a day (QID) | ORAL | Status: DC | PRN

## 2019-06-09 MED ORDER — ZINC SULFATE 220 (50 ZN) MG PO CAPS
220.0000 mg | ORAL_CAPSULE | Freq: Every day | ORAL | Status: DC
  Administered 2019-06-09 – 2019-06-19 (×10): 220 mg via ORAL
  Filled 2019-06-09 (×13): qty 1

## 2019-06-09 MED ORDER — IPRATROPIUM-ALBUTEROL 0.5-2.5 (3) MG/3ML IN SOLN
3.0000 mL | Freq: Four times a day (QID) | RESPIRATORY_TRACT | Status: DC
  Administered 2019-06-10 (×2): 3 mL via RESPIRATORY_TRACT
  Filled 2019-06-09 (×2): qty 3

## 2019-06-09 NOTE — Progress Notes (Signed)
Nutrition Follow-up  DOCUMENTATION CODES:   Not applicable  INTERVENTION:   Tube Feeding: Initiate once correct OG tube position confirmed Vital AF 1.2 at 40 ml/hr Pro-Stat 30 mL QID Provides 132 g of protein, 1552 kcals and 778 mL of free water Meets 102% calorie needs, 100% protein needs  TF regimen and propofol at current rate providing 1802 total kcal/day (105 % of kcal needs)  Add MVI with Minerals daily  NUTRITION DIAGNOSIS:   Inadequate oral intake related to acute illness as evidenced by NPO status.  GOAL:   Patient will meet greater than or equal to 90% of their needs   MONITOR:   Vent status, TF tolerance, Labs, Weight trends  REASON FOR ASSESSMENT:   Consult, Ventilator Enteral/tube feeding initiation and management  ASSESSMENT:   84 yo male admitted on 10/15 with ARDS due to COVID-19 pneimonia requiring intubation; Pt diagnosed on 10/5 with COVID-19. PMH includes anemia, COPD, CAD, MI, CHF, CKD III, GERD, HTN  10/15 Admit, intubated  Patient is currently intubated on ventilator support, requiring levophed MV: 9.7 L/min Temp (24hrs), Avg:98.6 F (37 C), Min:98.4 F (36.9 C), Max:98.8 F (37.1 C)  Propofol: 9.5 ml/hr  Unable to provide diet and weight history at this time  Noted abdominal xrays pending for OG tube repositioning   Labs: sodium 150 (H), Creatinine 2.07, BUN 44 Meds: decadron, colace, Vitamin C, zin sulfate    Diet Order:   Diet Order            Diet NPO time specified  Diet effective now              EDUCATION NEEDS:   Not appropriate for education at this time  Skin:  Skin Assessment: Reviewed RN Assessment  Last BM:  no documented BM  Height:   Ht Readings from Last 1 Encounters:  06/09/19 5\' 8"  (1.727 m)    Weight:   Wt Readings from Last 1 Encounters:  06/09/19 81.5 kg    Ideal Body Weight:     BMI:  Body mass index is 27.32 kg/m.  Estimated Nutritional Needs:   Kcal:  1720 kcals  Protein:   120-160 g  Fluid:  >/= 1.7 L   Cate Ramie Erman MS, RDN, LDN, CNSC [phone] Pager  [phone] Weekend/On-Call Pager

## 2019-06-09 NOTE — Progress Notes (Signed)
30 ML of versed wasted with Darrick Grinder RN

## 2019-06-09 NOTE — H&P (Signed)
History and Physical    EVELIO RUEDA ACZ:[id number] DOB: September 24, 1934 DOA: 06/09/2019  PCP: Redmond School, MD   Patient coming from: [facility]  I have personally briefly reviewed patient's old medical records in [place]  Chief Complaint: Respiratory failure/COVID-19.  HPI: ERVINE WITUCKI is a 84 y.o. male with medical history significant of anemia,  asthma/COPD,CAD, history MI, history of post MI ventricular thrombus,  chronic systolic heart failure, stage III chronic kidney disease, GERD, hypertension, prostate cancer, history of ICD placement who is coming to this facility from the emergency department from [facility] after presenting there due to dyspnea and AMS.  The patient has being intubated.  He was in that facility admitted from September 22 through October 3.  He was diagnosed on 10/05 with COVID-19.  Per sending facility physician, there is an outbreak of SARS 2 his nursing facility [facility]).  No further history is available.  ED Course: At the time of transfer presentation, vital signs at the facility where BP 96/72, heart rate 73, RR 14 and O2 sat 99%.  The patient was still on Levophed and Versed infusion.  His most recent ABG PH 7.35, PCO2 58, PO2 329, bicarbonate 32, TCO2 33.8, BE 5.9, O2 sat 99.5. His CBC showed a white count was 10, hemoglobin 11.9 g/dL and platelets 229.  BUN and creatinine were increasing from yesterday 39/1.5 to 2-day 46/1 0.9 mg/dL.  LFTs were reported normal.  PT was 15.4 seconds and INR 1.5.  Ammonia level was 45 mol/L.He is proBNP was 1411 and CRP 14.55 mg/dL.  He is urinalysis showed leukocyturia and he has a recent urine culture showing pansensitive Enterococcus.  Imaging is significant for chronic COPD changes and LLL infiltrate.  Please see records from [facility] for further detail.  Review of Systems: As per HPI otherwise 10 point review of systems negative.   Past Medical History:  Diagnosis Date  . Anemia   . Angina   .  Anticoagulated on warfarin 07/17/2011  . Arthritis   . Asthma   . Atherosclerosis of native arteries of the extremities with intermittent claudication 08/13/2011  . Blood transfusion 3 weeks ago  . CAD 04/23/2009  . CHF (congestive heart failure) ([place])   . Chronic kidney disease   . COPD (chronic obstructive pulmonary disease) ([place])   . GERD (gastroesophageal reflux disease)   . Heart attack [facility]) October 27,2012 and July 28, 2011  . Hypertension   . Myocardial infarction ([place])   . Prostate cancer ([place])   . S/P ICD (internal cardiac defibrillator) procedure, 6.17.13 02/10/2012  . Ventricular thrombus following MI (myocardial infarction) ([place]) 11/12   on coumadin    Past Surgical History:  Procedure Laterality Date  . CARDIAC CATHETERIZATION    . COLONOSCOPY N/A 03/23/2019   Procedure: COLONOSCOPY;  Surgeon: Daneil Dolin, MD;  [facility];  Service: Endoscopy;  Laterality: N/A;  . CORONARY ANGIOGRAM N/A 07/28/2011   Procedure: CORONARY ANGIOGRAM;  Surgeon: Leonie Man, MD;  Location: [facility] CATH LAB;  Service: Cardiovascular;  Laterality: N/A;  . CORONARY ANGIOPLASTY    . CORONARY STENT PLACEMENT  06/21/11  . FLEXIBLE SIGMOIDOSCOPY N/A 03/22/2019   Procedure: FLEXIBLE SIGMOIDOSCOPY;  Surgeon: Danie Binder, MD;  Location: [facility];  Service: Endoscopy;  Laterality: N/A;  . GROIN DEBRIDEMENT  07/18/2011   Procedure: [name] DEBRIDEMENT;  Surgeon: Angelia Mould, MD;  Location: [place];  Service: Vascular;  Laterality: Right;  exploration of right groin,evacuation of right  groin hematoma & repair of right superficial femoral artery  . IMPLANTABLE CARDIOVERTER DEFIBRILLATOR IMPLANT N/A 02/09/2012   Procedure: IMPLANTABLE CARDIOVERTER DEFIBRILLATOR IMPLANT;  Surgeon: Sanda Klein, MD;  Location: [place] CATH LAB;  Service: Cardiovascular;  Laterality: N/A;  . INSERT / REPLACE / REMOVE PACEMAKER  02/09/2012   ICD  . LEAD REVISION N/A 02/10/2012   Procedure: LEAD  REVISION;  Surgeon: Sanda Klein, MD;  Location: [place] CATH LAB;  Service: Cardiovascular;  Laterality: N/A;  . PERCUTANEOUS CORONARY STENT INTERVENTION (PCI-S) N/A 07/28/2011   Procedure: PERCUTANEOUS CORONARY STENT INTERVENTION (PCI-S);  Surgeon: Leonie Man, MD;  Location: [facility] CATH LAB;  Service: Cardiovascular;  Laterality: N/A;  . POLYPECTOMY  03/23/2019   Procedure: POLYPECTOMY;  Surgeon: Daneil Dolin, MD;  [facility];  Service: Endoscopy;;  cecum, transverse, descending     reports that he quit smoking about 14 years ago. His smoking use included cigarettes. He has a 120.00 pack-year smoking history. He has never used smokeless tobacco. He reports current alcohol use. He reports that he does not use drugs.  Allergies  Allergen Reactions  . Demerol Other (See Comments)    Abnormal behavior  . Zolpidem Tartrate Other (See Comments)    Pt. Became confused and aggitated    Family History  Problem Relation Age of Onset  . Diabetes type II Mother   . Coronary artery disease Mother   . Heart attack Mother   . Coronary artery disease Father   . Diabetes type II Brother   . Coronary artery disease Brother   . Hypertension Sister   . Craniosynostosis Neg Hx   . Stroke Neg Hx   . Colon cancer Neg Hx    Prior to Admission medications   Medication Sig Start Date End Date Taking? Authorizing Provider  aspirin 81 MG tablet Take 81 mg by mouth daily.    [provider]  bisoprolol (ZEBETA) 5 MG tablet Take 0.5 tablets (2.5 mg total) by mouth daily. 05/29/15   Troy Sine, MD  carisoprodol (SOMA) 350 MG tablet Take 350 mg by mouth 4 (four) times daily as needed for muscle spasms.    [provider]  chlorpheniramine (CHLOR-TRIMETON) 4 MG tablet Take 4 mg by mouth 2 (two) times daily.    [provider]  diltiazem (CARDIZEM SR) 120 MG 12 hr capsule Take 120 mg by mouth 2 (two) times daily.    [provider]  escitalopram (LEXAPRO) 20  MG tablet Take 20 mg by mouth daily. 10/04/18   [provider]  ferrous sulfate 325 (65 FE) MG tablet Take 325 mg by mouth daily.    [provider]  fluticasone (FLONASE) 50 MCG/ACT nasal spray Place 2 sprays into both nostrils every morning. *Occasional night use    [provider]  furosemide (LASIX) 40 MG tablet Take 40 mg by mouth daily as needed for fluid.     [provider]  gabapentin (NEURONTIN) 600 MG tablet Take 600 mg by mouth 2 (two) times daily.     [provider]  HYDROcodone-acetaminophen (NORCO) 10-325 MG tablet Take 1 tablet by mouth 4 (four) times daily as needed. pain 01/28/13   [provider]  ipratropium (ATROVENT) 0.03 % nasal spray Place 1 spray into both nostrils every 12 (twelve) hours.    [provider]  ipratropium-albuterol (DUONEB) 0.5-2.5 (3) MG/3ML SOLN Take 3 mLs by nebulization 4 (four) times daily.     [provider]  levalbuterol (  XOPENEX HFA) 45 MCG/ACT inhaler Inhale into the lungs every 4 (four) hours as needed for wheezing.    [provider]  levalbuterol Penne Lash) 0.63 MG/3ML nebulizer solution Take 1 ampule by nebulization 4 (four) times daily. For shortness of breath    [provider]  montelukast (SINGULAIR) 10 MG tablet Take 10 mg by mouth daily.      [provider]  Multiple Vitamin (MULTI-VITAMINS) TABS Take 1 tablet by mouth 2 (two) times daily.    [provider]  naproxen sodium (ALEVE) 220 MG tablet Take 220 mg by mouth as needed.    [provider]  nitroGLYCERIN (NITROSTAT) 0.4 MG SL tablet Place 0.4 mg under the tongue every 5 (five) minutes as needed. For chest pain    [provider]  omeprazole (PRILOSEC) 20 MG capsule Take 40 mg by mouth 2 (two) times daily.     [provider]  potassium chloride (MICRO-K) 10 MEQ CR capsule Take 10 mEq by mouth daily.     [provider]  predniSONE (DELTASONE)  10 MG tablet Take 1 tablet (10 mg total) by mouth daily. 11/10/18   Manuella Ghazi, Pratik D, DO  rosuvastatin (CRESTOR) 40 MG tablet Take 40 mg by mouth daily.    [provider]  terazosin (HYTRIN) 2 MG capsule Take 2 mg by mouth at bedtime.     [provider]  triamcinolone cream (KENALOG) 0.5 % Apply 1 application topically 2 (two) times daily as needed (for skin irritation).     [provider]    Physical Exam: 06/09/19 0600  -  77  79  18  120/81  -  97 %  -  - SP   06/09/19 0545  -  79  79  19  122/83  -  96 %  -  - SP   06/09/19 0425  98.8 F (37.1 C)  -  -  -  -  -  -  -  -     Constitutional: NAD, calm, comfortable Eyes: PERRL, but mildly miotic.  Lids and conjunctivae normal ENMT: ET in place.  Mucous membranes are moist.  Neck: normal, supple, no masses, no thyromegaly Respiratory: Decreased breath sounds with mild rhonchi and bilateral wheezing, no crackles. No accessory muscle use.  Cardiovascular: Regular rate and rhythm, no murmurs / rubs / gallops. No extremity edema. 2+ pedal pulses. No carotid bruits.  Abdomen: Nondistended. Bowel sounds positive. Soft, no tenderness, no masses palpated. No hepatosplenomegaly.  Musculoskeletal: no clubbing / cyanosis. Good ROM, no contractures. Normal muscle tone.  Skin: no rashes, lesions, ulcers on very limited (anterior only) examination. Neurologic: Sedated.  Unable to fully evaluate. Psychiatric: Sedated.  Labs on Admission: I have personally reviewed following labs and imaging studies  CBC: Recent Labs  Lab 06/09/19 0449  HGB 10.5*  HCT 63.8*   Basic Metabolic Panel: Recent Labs  Lab 06/09/19 0449  NA 150*  K 3.9   GFR: CrCl cannot be calculated (Patient's most recent lab result is older than the maximum 21 days allowed.). Liver Function Tests: No results for input(s): AST, ALT, ALKPHOS, BILITOT, PROT, ALBUMIN in the last 168 hours. No results for input(s): LIPASE, AMYLASE in the last 168 hours.  No results for input(s): AMMONIA in the last 168 hours. Coagulation Profile: No results for input(s): INR, PROTIME in the last 168 hours. Cardiac Enzymes: No results for input(s): CKTOTAL, CKMB, CKMBINDEX, TROPONINI in the last 168 hours. BNP (last 3 results) No  results for input(s): PROBNP in the last 8760 hours. HbA1C: No results for input(s): HGBA1C in the last 72 hours. CBG: No results for input(s): GLUCAP in the last 168 hours. Lipid Profile: No results for input(s): CHOL, HDL, LDLCALC, TRIG, CHOLHDL, LDLDIRECT in the last 72 hours. Thyroid Function Tests: No results for input(s): TSH, T4TOTAL, FREET4, T3FREE, THYROIDAB in the last 72 hours. Anemia Panel: No results for input(s): VITAMINB12, FOLATE, FERRITIN, TIBC, IRON, RETICCTPCT in the last 72 hours.  Radiological Exams on Admission: No results found.  EKG: Independently reviewed.  NSR RBBB Anteroseptal infarct  Assessment/Plan Principal Problem:   Acute respiratory distress syndrome (ARDS) due to COVID-19 virus ([place]) Admit to ICU/inpatient. Continue mechanical ventilation. Bronchodilators. Dexamethasone 6 mg IVP daily. Remdesivir per pharmacy. Follow with CBC, CMP, inflammatory markers.  Active Problems:   COPD (chronic obstructive pulmonary disease) (HCC) On dexamethasone and bronchodilators. Continue pneumonia treatment.    CKD (chronic kidney disease) stage 3, GFR 30-59 ml/min (HCC)   AKI (acute kidney injury) ([place]) Recheck renal function. Careful and time-limited IV hydration if renal function worsening. Monitor intake and output.    Urinary tract infection Secondary to pansensitive Enterococcus. Levaquin 750 mg IVPB daily.    Essential hypertension Currently on pressors due to sedation. Hold antihypertensives and monitor BP.    Coronary atherosclerosis Continue aspirin atorvastatin.    Chronic systolic congestive heart failure No signs of decompensation at this time. Hold carvedilol and  diuretics for now. Monitor intake and output.    DVT prophylaxis: Lovenox SQ. Code Status: Full code. Family Communication:  Disposition Plan: Admit for COVID-19, hyperammonemia, UTI treatment. Consults called: PCCM. Admission status: Inpatient/ICU.   Reubin Milan MD [facility]  If 7PM-7AM, please contact night-coverage  06/09/2019, 5:44 AM   This document was prepared using Dragon voice recognition software and may contain some unintended transcription errors.

## 2019-06-09 NOTE — Progress Notes (Signed)
PROGRESS NOTE    [name]  RWE:[id number] DOB: 05/28/1935 DOA: 06/09/2019 PCP: Redmond School, MD   Brief Narrative:  84 y.o. WM PMHx  anemia,  asthma/COPD,CAD, history MI, history of post MI ventricular thrombus (, anticoagulated on warfarin) chronic systolic heart failure, HTN, s/p ICD placement, CKD stage III, GERD,  prostate cancer,,  who is coming to this facility from the emergency department from [facility] after presenting there due to dyspnea and AMS.  The patient has being intubated.  He was in that facility admitted from September 22 through October 3.  He was diagnosed on 10/05 with COVID-19.  Per sending facility physician, there is an outbreak of SARS 2 his nursing facility [facility]).  No further history is available.  ED Course: At the time of transfer presentation, vital signs at the facility where BP 96/72, heart rate 73, RR 14 and O2 sat 99%.  The patient was still on Levophed and Versed infusion.  His most recent ABG PH 7.35, PCO2 58, PO2 329, bicarbonate 32, TCO2 33.8, BE 5.9, O2 sat 99.5. His CBC showed a white count was 10, hemoglobin 11.9 g/dL and platelets 229.  BUN and creatinine were increasing from yesterday 39/1.5 to 2-day 46/1 0.9 mg/dL.  LFTs were reported normal.  PT was 15.4 seconds and INR 1.5.  Ammonia level was 45 mol/L.He is proBNP was 1411 and CRP 14.55 mg/dL.  He is urinalysis showed leukocyturia and he has a recent urine culture showing pansensitive Enterococcus.  Imaging is significant for chronic COPD changes and LLL infiltrate.  Please see records from [facility] for further detail.   Subjective: 10/15 sedated/intubated   Assessment & Plan:   Principal Problem:   Acute respiratory distress syndrome (ARDS) due to COVID-19 virus [name]) Active Problems:   Essential hypertension   Coronary atherosclerosis   Chronic systolic congestive heart failure   CKD (chronic kidney disease) stage 3, GFR 30-59 ml/min (HCC)   COPD (chronic  obstructive pulmonary disease) (HCC)   AKI (acute kidney injury) (HCC)   Urinary tract infection   Acute respiratory failure with hypoxia (HCC)   Pneumonia due to COVID-19 virus   Acute renal failure superimposed on stage 3b chronic kidney disease (HCC)   Enterococcus UTI  Acute respiratory failure with hypoxia/Covid pneumonia -Remdesivir per pharmacy protocol -Decadron 6 mg daily -Combivent QID -We will wean today and possibly extubate COVID-19 Labs  Recent Labs    06/09/19 0535  DDIMER 0.55*  FERRITIN 356*  LDH 253*  CRP 2.9*    Lab Results  Component Value Date   [place] NEGATIVE 03/22/2019   ARDS -See Covid pneumonia  COPD -See Covid pneumonia  Acute on CKD stage III (baseline Cr ~1.7) Recent Labs  Lab 06/09/19 0535  CREATININE 2.07*  -Avoid nephrotoxic medication  Enterococcus UTI (pansensitive) -Complete 5-day course antibiotics  Chronic systolic CHF -Strict in and out -Daily weight -Transfuse for hemoglobin<7  Essential HTN -Was on Levophed -Currently controlled without BP medication monitor closely  CAD -Aspirin, Lipitor      DVT prophylaxis: Subcu Lovenox Code Status: Full Family Communication: 10/15 Dr. Roselie Awkward spoke with family Disposition Plan: TBD   Consultants:  PCCM   Procedures/Significant Events:     I have personally reviewed and interpreted all radiology studies and my findings are as above.  VENTILATOR SETTINGS:    Cultures   Antimicrobials: Anti-infectives (From admission, onward)   Start     Stop   06/11/19 0900  ampicillin (OMNIPEN) 2 g in sodium  chloride 0.9 % 100 mL IVPB         06/10/19 1000  remdesivir 100 mg in sodium chloride 0.9 % 250 mL IVPB     06/14/19 0959   06/09/19 1400  ampicillin (OMNIPEN) 2 g in sodium chloride 0.9 % 100 mL IVPB  Status:  Discontinued     06/09/19 1209   06/09/19 1000  remdesivir 200 mg in sodium chloride 0.9 % 250 mL IVPB     06/09/19 1127   06/09/19 0800   levofloxacin (LEVAQUIN) IVPB 750 mg  Status:  Discontinued     06/09/19 1206       Devices    LINES / TUBES:      Continuous Infusions: . [START ON 06/11/2019] ampicillin (OMNIPEN) IV    . feeding supplement (VITAL AF 1.2 CAL)    . norepinephrine (LEVOPHED) Adult infusion Stopped (06/09/19 1358)  . propofol (DIPRIVAN) infusion Stopped (06/09/19 1356)  . [START ON 06/10/2019] remdesivir 100 mg in NS 250 mL       Objective: Vitals:   06/09/19 0900 06/09/19 1000 06/09/19 1139 06/09/19 1530  BP: 114/89 99/76 (!) 89/71 140/82  Pulse: 91 82 91 92  Resp: 18 20 (!) 23 (!) 21  Temp:  98.4 F (36.9 C)    TempSrc:  Oral    SpO2: 95% 97% 97% 94%  Weight:      Height:        Intake/Output Summary (Last 24 hours) at 06/09/2019 1928 Last data filed at 06/09/2019 1800 Gross per 24 hour  Intake 154.76 ml  Output 500 ml  Net -345.24 ml   Filed Weights   06/09/19 0600  Weight: 81.5 kg    Examination:  General: Subsedated/intubated positive  acute respiratory distress Eyes: negative scleral hemorrhage, negative anisocoria, negative icterus ENT: Negative Runny nose, negative gingival bleeding, Neck:  Negative scars, masses, torticollis, lymphadenopathy, JVD Lungs: Diffuse poor air movement, without wheezes or crackles Cardiovascular: Regular rate and rhythm without murmur gallop or rub normal S1 and S2 Abdomen: negative abdominal pain, nondistended, positive soft, bowel sounds, no rebound, no ascites, no appreciable mass Extremities: No significant cyanosis, clubbing, or edema bilateral lower extremities Skin: Negative rashes, lesions, ulcers Psychiatric: Sedated/intubated Central nervous system: Sedated/intubated .     Data Reviewed: Care during the described time interval was provided by me .  I have reviewed this patient's available data, including medical history, events of note, physical examination, and all test results as part of my evaluation.   CBC: Recent  Labs  Lab 06/09/19 0449 06/09/19 0535  WBC  --  9.3  NEUTROABS  --  8.2*  HGB 10.5* 10.4*  HCT 31.0* 36.9*  MCV  --  97.4  PLT  --  161   Basic Metabolic Panel: Recent Labs  Lab 06/09/19 0449 06/09/19 0535 06/09/19 1650  NA 150* 150*  --   K 3.9 4.0  --   CL  --  109  --   CO2  --  29  --   GLUCOSE  --  239*  --   BUN  --  44*  --   CREATININE  --  2.07*  --   CALCIUM  --  8.0*  --   MG  --  2.3 2.3  PHOS  --  2.8 2.6   GFR: Estimated Creatinine Clearance: 26.6 mL/min (A) (by C-G formula based on SCr of 2.07 mg/dL (H)). Liver Function Tests: Recent Labs  Lab 06/09/19 0535  AST 30  ALT 25  ALKPHOS 61  BILITOT 0.9  PROT 6.2*  ALBUMIN 2.8*   No results for input(s): LIPASE, AMYLASE in the last 168 hours. Recent Labs  Lab 06/09/19 0535  AMMONIA 22   Coagulation Profile: No results for input(s): INR, PROTIME in the last 168 hours. Cardiac Enzymes: No results for input(s): CKTOTAL, CKMB, CKMBINDEX, TROPONINI in the last 168 hours. BNP (last 3 results) No results for input(s): PROBNP in the last 8760 hours. HbA1C: No results for input(s): HGBA1C in the last 72 hours. CBG: Recent Labs  Lab 06/09/19 1743  GLUCAP 142*   Lipid Profile: Recent Labs    06/09/19 0535  TRIG 94   Thyroid Function Tests: No results for input(s): TSH, T4TOTAL, FREET4, T3FREE, THYROIDAB in the last 72 hours. Anemia Panel: Recent Labs    06/09/19 0535  FERRITIN 356*   Urine analysis:    Component Value Date/Time   COLORURINE AMBER (A) 05/14/2019 0000   APPEARANCEUR CLOUDY (A) 05/14/2019 0000   LABSPEC 1.023 05/14/2019 0000   PHURINE 5.0 05/14/2019 0000   GLUCOSEU NEGATIVE 05/14/2019 0000   HGBUR LARGE (A) 05/14/2019 0000   BILIRUBINUR NEGATIVE 05/14/2019 0000   KETONESUR NEGATIVE 05/14/2019 0000   PROTEINUR 100 (A) 05/14/2019 0000   UROBILINOGEN 0.2 06/10/2015 1430   NITRITE NEGATIVE 05/14/2019 0000   LEUKOCYTESUR NEGATIVE 05/14/2019 0000   Sepsis Labs:  @[email or web address] (procalcitonin:4,lacticidven:4)  )No results found for this or any previous visit (from the past 240 hour(s)).       Radiology Studies: Dg Abd 1 View  Result Date: 06/09/2019 CLINICAL DATA:  Orogastric tube placement. EXAM: ABDOMEN - 1 VIEW COMPARISON:  November 03, 2016. FINDINGS: The bowel gas pattern is normal. Distal tip of enteric tube is seen at gastroesophageal junction. No radio-opaque calculi or other significant radiographic abnormality are seen. IMPRESSION: Distal tip of enteric tube seen at gastroesophageal junction. Advancement is recommended. No abnormal bowel gas pattern is noted. Electronically Signed   By: Marijo Conception M.D.   On: 06/09/2019 12:12   Dg Chest Port 1 View  Result Date: 06/09/2019 CLINICAL DATA:  Post intubation. COVID-19 positive patient. EXAM: PORTABLE CHEST 1 VIEW COMPARISON:  June 08, 2019 FINDINGS: Endotracheal tube in satisfactory radiographic position. Enteric catheter with side hole overlying the expected location of the gastric cardia. Dual lead cardiac pacemaker in stable position. Cardiomediastinal silhouette is normal. Mediastinal contours appear intact. Subtle patchy bilateral ground-glass opacities with lower lobe predominance. Osseous structures are without acute abnormality. Soft tissues are grossly normal. IMPRESSION: 1. Subtle patchy bilateral ground-glass opacities with lower lobe predominance. 2. Endotracheal tube in satisfactory position. 3. Enteric catheter with side hole overlying the expected location of the gastric cardia. Electronically Signed   By: Fidela Salisbury M.D.   On: 06/09/2019 10:04   Dg Abd Portable 1v  Result Date: 06/09/2019 CLINICAL DATA:  OG tube. EXAM: PORTABLE ABDOMEN - 1 VIEW COMPARISON:  Prior study same day. FINDINGS: OG tube tip in the upper stomach. Side hole at the gastroesophageal junction. Subsequent repositioning was performed. No bowel distention. Cardiac pacer noted IMPRESSION: OG tube with  position as above. Electronically Signed   By: Marcello Moores  Register   On: 06/09/2019 12:43   Dg Abd Portable 1v  Result Date: 06/09/2019 CLINICAL DATA:  OG tube positioning. EXAM: PORTABLE ABDOMEN - 1 VIEW COMPARISON:  Prior study same day. FINDINGS: OG tube tip noted in upper stomach. Side hole at the gastroesophageal junction. Subsequent repositioning was performed as demonstrated by later image. No  bowel distention. Cardiac pacer noted. IMPRESSION: OG tube position as above. Electronically Signed   By: Marcello Moores  Register   On: 06/09/2019 12:43   Dg Abd Portable 1v  Result Date: 06/09/2019 CLINICAL DATA:  OG tube repositioning. EXAM: PORTABLE ABDOMEN - 1 VIEW COMPARISON:  Prior study same day. FINDINGS: Interval advancement of OG tube with side hole and tip over the upper stomach. No bowel distention. Cardiac pacer noted. IMPRESSION: Number vascular OG tube with side hole and tip over the upper stomach. Electronically Signed   By: [place]   On: 06/09/2019 12:41        Scheduled Meds: . aspirin  81 mg Oral Daily  . atorvastatin  80 mg Oral q1800  . chlorhexidine  15 mL Mouth/Throat BID  . Chlorhexidine Gluconate Cloth  6 each Topical Daily  . dexamethasone (DECADRON) injection  6 mg Intravenous Q24H  . enoxaparin (LOVENOX) injection  40 mg Subcutaneous Q12H  . escitalopram  20 mg Per Tube Daily  . feeding supplement (PRO-STAT SUGAR FREE 64)  30 mL Per Tube QID  . Ipratropium-Albuterol  1 puff Inhalation Q6H  . mouth rinse  15 mL Mouth Rinse 10 times per day  . multivitamin with minerals  1 tablet Oral Daily  . pantoprazole sodium  40 mg Per Tube Daily  . vitamin C  500 mg Oral Daily  . zinc sulfate  220 mg Oral Daily   Continuous Infusions: . [START ON 06/11/2019] ampicillin (OMNIPEN) IV    . feeding supplement (VITAL AF 1.2 CAL)    . norepinephrine (LEVOPHED) Adult infusion Stopped (06/09/19 1358)  . propofol (DIPRIVAN) infusion Stopped (06/09/19 1356)  . [START ON  06/10/2019] remdesivir 100 mg in NS 250 mL       LOS: 0 days   The patient is critically ill with multiple organ systems failure and requires high complexity decision making for assessment and support, frequent evaluation and titration of therapies, application of advanced monitoring technologies and extensive interpretation of multiple databases. Critical Care Time devoted to patient care services described in this note  Time spent: 40 minutes     WOODS, Geraldo Docker, MD [facility] Pager [phone]  If 7PM-7AM, please contact night-coverage [email or web address] Password [email or web address] 06/09/2019, 7:28 PM

## 2019-06-09 NOTE — Progress Notes (Signed)
NAME:  [name], MRN:  [id number], DOB:  04-27-1935, LOS: 0 ADMISSION DATE:  06/09/2019, CONSULTATION DATE:  10/15 REFERRING MD:  Olevia Bowens, CHIEF COMPLAINT:  dyspnea   Brief History   84 y/o male admitted from [facility] on 10/15 after being intubated for mechanical ventilation in setting of ARDS from COVID 19.  History of present illness   84 year old male admitted overnight after being intubated at [facility] rocking him for ARDS from COVID-19 pneumonia.  At baseline lives in a nursing home, has multiple comorbid illnesses.  Cannot take history due to intubated status.  Past Medical History  Coronary artery disease Hypertension Hyperlipidemia Congestive heart failure, systolic, LVEF 63-78% 5885 TTE CKD  Significant Hospital Events   10/14 intubated [facility] 10/15 admitted [place]  Consults:  PCCM  Procedures:  10/14 ETT >   Significant Diagnostic Tests:    Micro Data:  10/5 SARS COV 2 Positive at [facility])  Antimicrobials/COVID Rx:  10/15 Decadron>  10/15 Remdesivir >   Interim history/subjective:  Admitted overnight Minimal vent settings  Objective   Blood pressure 98/81, pulse 88, temperature 98.8 F (37.1 C), temperature source Oral, resp. rate 18, height 5\' 8"  (1.727 m), weight 81.5 kg, SpO2 95 %.    Vent Mode: PRVC FiO2 (%):  [50 %] 50 % Set Rate:  [20 bmp] 20 bmp Vt Set:  [480 mL] 480 mL PEEP:  [5 cmH20] 5 cmH20 Plateau Pressure:  [23 cmH20] 23 cmH20  No intake or output data in the 24 hours ending 06/09/19 0724 Filed Weights   06/09/19 0600  Weight: 81.5 kg    Examination:  General:  In bed on vent HENT: NCAT ETT in place PULM: CTA B, vent supported breathing CV: RRR, no mgr GI: BS+, soft, nontender MSK: normal bulk and tone Neuro: sedated on vent  CXR images reviewed today: minimal infiltrate, ETT in place   Resolved Hospital Problem list    Assessment & Plan:  Acute respiratory failure with hypoxemia due to COVID-19 pneumonia: minimal  pulmonary parenchymal changes so hopefully he will have a better chance at recovery Decadron for 10 day course Remdesivir for 5 day course Wean ventilator today Stop sedation VAP prevention Consider enrollment in [place] PACT trial  Recent enterococcal UTI Change to ampicillin D/c levaquin  Need for sedation for mechanical ventilation Stop versed infusion Wean off propofol Use fentanyl prn Daily WUA, start now RASS target 0  Acute on chronic kidney failure Start enteral intake with tube feeding Monitor BMET and UOP Replace electrolytes as needed   Best practice:  Diet: start tube feeding Pain/Anxiety/Delirium protocol (if indicated): yes, as above VAP protocol (if indicated): yes DVT prophylaxis: sub q heparin per protocol, discuss COVID PACT with patient's family GI prophylaxis: Pantoprazole for stress ulcer prophylaxis Glucose control: SSI Mobility: bed rest Code Status: full Family Communication: updated his family by phone 10/15 Disposition: remain in ICU  Labs   CBC: Recent Labs  Lab 06/09/19 0449 06/09/19 0535  WBC  --  9.3  NEUTROABS  --  8.2*  HGB 10.5* 10.4*  HCT 31.0* 36.9*  MCV  --  97.4  PLT  --  027    Basic Metabolic Panel: Recent Labs  Lab 06/09/19 0449  NA 150*  K 3.9   GFR: CrCl cannot be calculated (Patient's most recent lab result is older than the maximum 21 days allowed.). Recent Labs  Lab 06/09/19 0535  WBC 9.3    Liver Function Tests: No results for input(s):  AST, ALT, ALKPHOS, BILITOT, PROT, ALBUMIN in the last 168 hours. No results for input(s): LIPASE, AMYLASE in the last 168 hours. Recent Labs  Lab 06/09/19 0535  AMMONIA 22    ABG    Component Value Date/Time   PHART 7.339 (L) 06/09/2019 0449   PCO2ART 52.6 (H) 06/09/2019 0449   PO2ART 92.0 06/09/2019 0449   HCO3 28.2 (H) 06/09/2019 0449   TCO2 30 06/09/2019 0449   ACIDBASEDEF 1.0 06/28/2011 2124   O2SAT 96.0 06/09/2019 0449     Coagulation Profile: No  results for input(s): INR, PROTIME in the last 168 hours.  Cardiac Enzymes: No results for input(s): CKTOTAL, CKMB, CKMBINDEX, TROPONINI in the last 168 hours.  HbA1C: Hgb A1c MFr Bld  Date/Time Value Ref Range Status  07/28/2011 12:05 PM 5.2 <5.7 % Final    Comment:    (NOTE)                                                                       According to the ADA Clinical Practice Recommendations for 2011, when HbA1c is used as a screening test:  >=6.5%   Diagnostic of Diabetes Mellitus           (if abnormal result is confirmed) 5.7-6.4%   Increased risk of developing Diabetes Mellitus References:Diagnosis and Classification of Diabetes Mellitus,Diabetes Care,2011,34(Suppl 1):S62-S69 and Standards of Medical Care in         Diabetes - 2011,Diabetes CVEL,3810,17 (Suppl 1):S11-S61.  06/21/2011 01:00 PM 6.0 (H) <5.7 % Final    Comment:    (NOTE)                                                                       According to the ADA Clinical Practice Recommendations for 2011, when HbA1c is used as a screening test:  >=6.5%   Diagnostic of Diabetes Mellitus           (if abnormal result is confirmed) 5.7-6.4%   Increased risk of developing Diabetes Mellitus References:Diagnosis and Classification of Diabetes Mellitus,Diabetes PZWC,5852,77(OEUMP 1):S62-S69 and Standards of Medical Care in         Diabetes - 2011,Diabetes Care,2011,34 (Suppl 1):S11-S61.    CBG: No results for input(s): GLUCAP in the last 168 hours.  Review of Systems/PMH/Surg Hx/Soc Hx  Cannot obtain due to intubation    Allergies Allergies  Allergen Reactions  . Demerol Other (See Comments)    Abnormal behavior  . Zolpidem Tartrate Other (See Comments)    Pt. Became confused and aggitated     Home Medications  Prior to Admission medications   Medication Sig Start Date End Date Taking? Authorizing Provider  aspirin 81 MG tablet Take 81 mg by mouth daily.    [provider]  bisoprolol  (ZEBETA) 5 MG tablet Take 0.5 tablets (2.5 mg total) by mouth daily. 05/29/15   Troy Sine, MD  carisoprodol (SOMA) 350 MG tablet Take 350 mg by mouth 4 (four) times daily as needed for muscle  spasms.    [provider]  chlorpheniramine (CHLOR-TRIMETON) 4 MG tablet Take 4 mg by mouth 2 (two) times daily.    [provider]  diltiazem (CARDIZEM SR) 120 MG 12 hr capsule Take 120 mg by mouth 2 (two) times daily.    [provider]  escitalopram (LEXAPRO) 20 MG tablet Take 20 mg by mouth daily. 10/04/18   [provider]  ferrous sulfate 325 (65 FE) MG tablet Take 325 mg by mouth daily.    [provider]  fluticasone (FLONASE) 50 MCG/ACT nasal spray Place 2 sprays into both nostrils every morning. *Occasional night use    [provider]  furosemide (LASIX) 40 MG tablet Take 40 mg by mouth daily as needed for fluid.     [provider]  gabapentin (NEURONTIN) 600 MG tablet Take 600 mg by mouth 2 (two) times daily.     [provider]  HYDROcodone-acetaminophen (NORCO) 10-325 MG tablet Take 1 tablet by mouth 4 (four) times daily as needed. pain 01/28/13   [provider]  ipratropium (ATROVENT) 0.03 % nasal spray Place 1 spray into both nostrils every 12 (twelve) hours.    [provider]  ipratropium-albuterol (DUONEB) 0.5-2.5 (3) MG/3ML SOLN Take 3 mLs by nebulization 4 (four) times daily.     [provider]  levalbuterol Penne Lash HFA) 45 MCG/ACT inhaler Inhale into the lungs every 4 (four) hours as needed for wheezing.    [provider]  levalbuterol Penne Lash) 0.63 MG/3ML nebulizer solution Take 1 ampule by nebulization 4 (four) times daily. For shortness of breath    [provider]  montelukast (SINGULAIR) 10 MG tablet Take 10 mg by mouth daily.      [provider]  Multiple Vitamin (MULTI-VITAMINS) TABS Take 1 tablet by mouth 2 (two) times daily.    [provider]  naproxen sodium (ALEVE) 220 MG tablet Take 220 mg by mouth as needed.    [provider]  nitroGLYCERIN (NITROSTAT) 0.4 MG SL tablet Place 0.4 mg under the tongue every 5 (five) minutes as needed. For chest pain    [provider]  omeprazole (PRILOSEC) 20 MG capsule Take 40 mg by mouth 2 (two) times daily.     [provider]  potassium chloride (MICRO-K) 10 MEQ CR capsule Take 10 mEq by mouth daily.     [provider]  predniSONE (DELTASONE) 10 MG tablet Take 1 tablet (10 mg total) by mouth daily. 11/10/18   Manuella Ghazi, Pratik D, DO  rosuvastatin (CRESTOR) 40 MG tablet Take 40 mg by mouth daily.    [provider]  terazosin (HYTRIN) 2 MG capsule Take 2 mg by mouth at bedtime.     [provider]  triamcinolone cream (KENALOG) 0.5 % Apply 1 application topically 2 (two) times daily as needed (for skin irritation).     [provider]     Critical care time: 45 minutes     Roselie Awkward, MD [place] [facility] Pager: [phone] Cell: [phone] If no response, call [phone]

## 2019-06-09 NOTE — Progress Notes (Signed)
230 ML of Versed wasted in [facility] with Rick Duff, RN

## 2019-06-10 ENCOUNTER — Inpatient Hospital Stay (HOSPITAL_COMMUNITY): Payer: Medicare Other

## 2019-06-10 DIAGNOSIS — U071 COVID-19: Secondary | ICD-10-CM | POA: Diagnosis not present

## 2019-06-10 DIAGNOSIS — N179 Acute kidney failure, unspecified: Secondary | ICD-10-CM

## 2019-06-10 DIAGNOSIS — N1832 Chronic kidney disease, stage 3b: Secondary | ICD-10-CM

## 2019-06-10 DIAGNOSIS — J431 Panlobular emphysema: Secondary | ICD-10-CM

## 2019-06-10 LAB — COMPREHENSIVE METABOLIC PANEL
ALT: 24 U/L (ref 0–44)
AST: 28 U/L (ref 15–41)
Albumin: 2.8 g/dL — ABNORMAL LOW (ref 3.5–5.0)
Alkaline Phosphatase: 56 U/L (ref 38–126)
Anion gap: 13 (ref 5–15)
BUN: 48 mg/dL — ABNORMAL HIGH (ref 8–23)
CO2: 27 mmol/L (ref 22–32)
Calcium: 8.4 mg/dL — ABNORMAL LOW (ref 8.9–10.3)
Chloride: 114 mmol/L — ABNORMAL HIGH (ref 98–111)
Creatinine, Ser: 1.72 mg/dL — ABNORMAL HIGH (ref 0.61–1.24)
GFR calc Af Amer: 42 mL/min — ABNORMAL LOW (ref >60)
GFR calc non Af Amer: 36 mL/min — ABNORMAL LOW (ref >60)
Glucose, Bld: 165 mg/dL — ABNORMAL HIGH (ref 70–99)
Potassium: 3.6 mmol/L (ref 3.5–5.1)
Sodium: 154 mmol/L — ABNORMAL HIGH (ref 135–145)
Total Bilirubin: 0.8 mg/dL (ref 0.3–1.2)
Total Protein: 6.1 g/dL — ABNORMAL LOW (ref 6.5–8.1)

## 2019-06-10 LAB — POCT I-STAT 7, (LYTES, BLD GAS, ICA,H+H)
Acid-Base Excess: 4 mmol/L — ABNORMAL HIGH (ref 0.0–2.0)
Bicarbonate: 28.9 mmol/L — ABNORMAL HIGH (ref 20.0–28.0)
Calcium, Ion: 1.2 mmol/L (ref 1.15–1.40)
HCT: 30 % — ABNORMAL LOW (ref 39.0–52.0)
Hemoglobin: 10.2 g/dL — ABNORMAL LOW (ref 13.0–17.0)
O2 Saturation: 93 %
Patient temperature: 98.2
Potassium: 3.5 mmol/L (ref 3.5–5.1)
Sodium: 152 mmol/L — ABNORMAL HIGH (ref 135–145)
TCO2: 30 mmol/L (ref 22–32)
pCO2 arterial: 44.4 mmHg (ref 32.0–48.0)
pH, Arterial: 7.421 (ref 7.350–7.450)
pO2, Arterial: 64 mmHg — ABNORMAL LOW (ref 83.0–108.0)

## 2019-06-10 LAB — CBC WITH DIFFERENTIAL/PLATELET
Abs Immature Granulocytes: 0.05 10*3/uL (ref 0.00–0.07)
Basophils Absolute: 0 10*3/uL (ref 0.0–0.1)
Basophils Relative: 0 %
Eosinophils Absolute: 0 10*3/uL (ref 0.0–0.5)
Eosinophils Relative: 0 %
HCT: 34.7 % — ABNORMAL LOW (ref 39.0–52.0)
Hemoglobin: 9.9 g/dL — ABNORMAL LOW (ref 13.0–17.0)
Immature Granulocytes: 1 %
Lymphocytes Relative: 8 %
Lymphs Abs: 0.7 10*3/uL (ref 0.7–4.0)
MCH: 27.5 pg (ref 26.0–34.0)
MCHC: 28.5 g/dL — ABNORMAL LOW (ref 30.0–36.0)
MCV: 96.4 fL (ref 80.0–100.0)
Monocytes Absolute: 0.6 10*3/uL (ref 0.1–1.0)
Monocytes Relative: 6 %
Neutro Abs: 8.2 10*3/uL — ABNORMAL HIGH (ref 1.7–7.7)
Neutrophils Relative %: 85 %
Platelets: 155 10*3/uL (ref 150–400)
RBC: 3.6 MIL/uL — ABNORMAL LOW (ref 4.22–5.81)
RDW: 14.6 % (ref 11.5–15.5)
WBC: 9.6 10*3/uL (ref 4.0–10.5)
nRBC: 0 % (ref 0.0–0.2)

## 2019-06-10 LAB — D-DIMER, QUANTITATIVE: D-Dimer, Quant: 0.49 ug/mL-FEU (ref 0.00–0.50)

## 2019-06-10 LAB — GLUCOSE, CAPILLARY
Glucose-Capillary: 136 mg/dL — ABNORMAL HIGH (ref 70–99)
Glucose-Capillary: 147 mg/dL — ABNORMAL HIGH (ref 70–99)
Glucose-Capillary: 156 mg/dL — ABNORMAL HIGH (ref 70–99)
Glucose-Capillary: 161 mg/dL — ABNORMAL HIGH (ref 70–99)

## 2019-06-10 LAB — PHOSPHORUS: Phosphorus: 2.5 mg/dL (ref 2.5–4.6)

## 2019-06-10 LAB — FERRITIN: Ferritin: 322 ng/mL (ref 24–336)

## 2019-06-10 LAB — C-REACTIVE PROTEIN: CRP: 2.2 mg/dL — ABNORMAL HIGH (ref <1.0)

## 2019-06-10 LAB — ABO/RH: ABO/RH(D): A POS

## 2019-06-10 LAB — MAGNESIUM: Magnesium: 2.4 mg/dL (ref 1.7–2.4)

## 2019-06-10 MED ORDER — TAMSULOSIN HCL 0.4 MG PO CAPS
0.4000 mg | ORAL_CAPSULE | Freq: Every day | ORAL | Status: DC
  Administered 2019-06-10 – 2019-06-19 (×9): 0.4 mg via ORAL
  Filled 2019-06-10 (×11): qty 1

## 2019-06-10 MED ORDER — IPRATROPIUM-ALBUTEROL 20-100 MCG/ACT IN AERS: 1.0000 | INHALATION_SPRAY | Freq: Four times a day (QID) | RESPIRATORY_TRACT | Status: DC | PRN

## 2019-06-10 MED ORDER — PANTOPRAZOLE SODIUM 40 MG PO TBEC
40.0000 mg | DELAYED_RELEASE_TABLET | Freq: Every day | ORAL | Status: DC
  Administered 2019-06-10 – 2019-06-19 (×9): 40 mg via ORAL
  Filled 2019-06-10 (×10): qty 1

## 2019-06-10 MED ORDER — SODIUM CHLORIDE 0.9 % IV SOLN
INTRAVENOUS | Status: DC
  Administered 2019-06-10: 19:00:00 via INTRAVENOUS

## 2019-06-10 MED ORDER — PNEUMOCOCCAL VAC POLYVALENT 25 MCG/0.5ML IJ INJ
0.5000 mL | INJECTION | INTRAMUSCULAR | Status: AC
  Administered 2019-06-15: 0.5 mL via INTRAMUSCULAR
  Filled 2019-06-10: qty 0.5

## 2019-06-10 NOTE — Progress Notes (Signed)
PROGRESS NOTE    [name]  YKD:[id number] DOB: Mar 12, 1935 DOA: 06/09/2019 PCP: Redmond School, MD   Brief Narrative:  84 y.o. WM PMHx  anemia,  asthma/COPD,CAD, history MI, history of post MI ventricular thrombus (, anticoagulated on warfarin) chronic systolic heart failure, HTN, s/p ICD placement, CKD stage III, GERD,  prostate cancer,,  who is coming to this facility from the emergency department from [facility] after presenting there due to dyspnea and AMS.  The patient has being intubated.  He was in that facility admitted from September 22 through October 3.  He was diagnosed on 10/05 with COVID-19.  Per sending facility physician, there is an outbreak of SARS 2 his nursing facility [facility]).  No further history is available.  ED Course: At the time of transfer presentation, vital signs at the facility where BP 96/72, heart rate 73, RR 14 and O2 sat 99%.  The patient was still on Levophed and Versed infusion.  His most recent ABG PH 7.35, PCO2 58, PO2 329, bicarbonate 32, TCO2 33.8, BE 5.9, O2 sat 99.5. His CBC showed a white count was 10, hemoglobin 11.9 g/dL and platelets 229.  BUN and creatinine were increasing from yesterday 39/1.5 to 2-day 46/1 0.9 mg/dL.  LFTs were reported normal.  PT was 15.4 seconds and INR 1.5.  Ammonia level was 45 mol/L.He is proBNP was 1411 and CRP 14.55 mg/dL.  He is urinalysis showed leukocyturia and he has a recent urine culture showing pansensitive Enterococcus.  Imaging is significant for chronic COPD changes and LLL infiltrate.  Please see records from [facility] for further detail.   Subjective: 10/16 sedated/intubated  ADDENDUM; extubated this afternoon   Assessment & Plan:   Principal Problem:   Acute respiratory distress syndrome (ARDS) due to COVID-19 virus [facility]) Active Problems:   Essential hypertension   Coronary atherosclerosis   Chronic systolic congestive heart failure   CKD (chronic kidney disease) stage 3, GFR 30-59  ml/min (HCC)   COPD (chronic obstructive pulmonary disease) (HCC)   AKI (acute kidney injury) (HCC)   Urinary tract infection   Acute respiratory failure with hypoxia (HCC)   Pneumonia due to COVID-19 virus   Acute renal failure superimposed on stage 3b chronic kidney disease (HCC)   Enterococcus UTI  Acute respiratory failure with hypoxia/Covid pneumonia -Remdesivir per pharmacy protocol -Decadron 6 mg daily -Combivent QID -10/16 extubated  COVID-19 Labs  Recent Labs    06/09/19 0535  DDIMER 0.55*  FERRITIN 356*  LDH 253*  CRP 2.9*    Lab Results  Component Value Date   [place] NEGATIVE 03/22/2019   ARDS -See Covid pneumonia  COPD -See Covid pneumonia  Acute on CKD stage III (baseline Cr ~1.7) Recent Labs  Lab 06/09/19 0535 06/10/19 0550  CREATININE 2.07* 1.72*  -Improving.  Hydrate slowly normal saline 53ml/hr  Enterococcus UTI (pansensitive) -Complete 5-day course antibiotics  Chronic systolic CHF -Strict in and out +107.51ml -Daily weight Filed Weights   06/09/19 0600 06/10/19 0500  Weight: 81.5 kg 82 kg  -Transfuse for hemoglobin<7  Essential HTN -Was on Levophed, now DC'd -Currently controlled without BP medication monitor closely  CAD -Aspirin 81 mg daily, -Lipitor 80 mg daily      DVT prophylaxis: Subcu Lovenox Code Status: Full Family Communication: 10/16 Dr. Roselie Awkward spoke with family Disposition Plan: TBD   Consultants:  PCCM   Procedures/Significant Events:     I have personally reviewed and interpreted all radiology studies and my findings are as above.  VENTILATOR SETTINGS:    Cultures ??  Antimicrobials: Anti-infectives (From admission, onward)   Start     Stop   06/11/19 0900  ampicillin (OMNIPEN) 2 g in sodium chloride 0.9 % 100 mL IVPB         06/10/19 1000  remdesivir 100 mg in sodium chloride 0.9 % 250 mL IVPB     06/14/19 0959   06/09/19 1400  ampicillin (OMNIPEN) 2 g in sodium chloride 0.9 %  100 mL IVPB  Status:  Discontinued     06/09/19 1209   06/09/19 1000  remdesivir 200 mg in sodium chloride 0.9 % 250 mL IVPB     06/09/19 1127   06/09/19 0800  levofloxacin (LEVAQUIN) IVPB 750 mg  Status:  Discontinued     06/09/19 1206       Devices    LINES / TUBES:      Continuous Infusions: . [START ON 06/11/2019] ampicillin (OMNIPEN) IV    . feeding supplement (VITAL AF 1.2 CAL)    . norepinephrine (LEVOPHED) Adult infusion 2 mcg/min (06/10/19 0600)  . propofol (DIPRIVAN) infusion 30 mcg/kg/min (06/10/19 0600)  . remdesivir 100 mg in NS 250 mL       Objective: Vitals:   06/10/19 0305 06/10/19 0400 06/10/19 0500 06/10/19 0600  BP: 100/73 105/72 94/69 116/79  Pulse: 84 95 91 89  Resp: (!) 22 (!) 22 20 (!) 21  Temp:  98.2 F (36.8 C)    TempSrc:  Oral    SpO2: 95% 91% 90% 93%  Weight:   82 kg   Height:        Intake/Output Summary (Last 24 hours) at 06/10/2019 0734 Last data filed at 06/10/2019 0600 Gross per 24 hour  Intake 283.69 ml  Output 730 ml  Net -446.31 ml   Filed Weights   06/09/19 0600 06/10/19 0500  Weight: 81.5 kg 82 kg   Physical Exam:  General: Sedated/intubated, positive acute respiratory distress Eyes: negative scleral hemorrhage, negative anisocoria, negative icterus ENT: Negative Runny nose, negative gingival bleeding, Neck:  Negative scars, masses, torticollis, lymphadenopathy, JVD Lungs: Clear to auscultation bilaterally without wheezes or crackles Cardiovascular: Regular rate and rhythm without murmur gallop or rub normal S1 and S2 Abdomen: negative abdominal pain, nondistended, positive soft, bowel sounds, no rebound, no ascites, no appreciable mass Extremities: No significant cyanosis, clubbing, or edema bilateral lower extremities Skin: Negative rashes, lesions, ulcers Psychiatric: Sedated/intubated Central nervous system: Sedated/intubated      Data Reviewed: Care during the described time interval was provided by me .   I have reviewed this patient's available data, including medical history, events of note, physical examination, and all test results as part of my evaluation.   CBC: Recent Labs  Lab 06/09/19 0449 06/09/19 0535 06/10/19 0550 06/10/19 0624  WBC  --  9.3 9.6  --   NEUTROABS  --  8.2* 8.2*  --   HGB 10.5* 10.4* 9.9* 10.2*  HCT 31.0* 36.9* 34.7* 30.0*  MCV  --  97.4 96.4  --   PLT  --  196 155  --    Basic Metabolic Panel: Recent Labs  Lab 06/09/19 0449 06/09/19 0535 06/09/19 1650 06/10/19 0624  NA 150* 150*  --  152*  K 3.9 4.0  --  3.5  CL  --  109  --   --   CO2  --  29  --   --   GLUCOSE  --  239*  --   --  BUN  --  44*  --   --   CREATININE  --  2.07*  --   --   CALCIUM  --  8.0*  --   --   MG  --  2.3 2.3  --   PHOS  --  2.8 2.6  --    GFR: Estimated Creatinine Clearance: 26.6 mL/min (A) (by C-G formula based on SCr of 2.07 mg/dL (H)). Liver Function Tests: Recent Labs  Lab 06/09/19 0535  AST 30  ALT 25  ALKPHOS 61  BILITOT 0.9  PROT 6.2*  ALBUMIN 2.8*   No results for input(s): LIPASE, AMYLASE in the last 168 hours. Recent Labs  Lab 06/09/19 0535  AMMONIA 22   Coagulation Profile: No results for input(s): INR, PROTIME in the last 168 hours. Cardiac Enzymes: No results for input(s): CKTOTAL, CKMB, CKMBINDEX, TROPONINI in the last 168 hours. BNP (last 3 results) No results for input(s): PROBNP in the last 8760 hours. HbA1C: No results for input(s): HGBA1C in the last 72 hours. CBG: Recent Labs  Lab 06/09/19 1743 06/09/19 1936 06/10/19 0011 06/10/19 0351  GLUCAP 142* 136* 161* 147*   Lipid Profile: Recent Labs    06/09/19 0535  TRIG 94   Thyroid Function Tests: No results for input(s): TSH, T4TOTAL, FREET4, T3FREE, THYROIDAB in the last 72 hours. Anemia Panel: Recent Labs    06/09/19 0535  FERRITIN 356*   Urine analysis:    Component Value Date/Time   COLORURINE AMBER (A) 05/14/2019 0000   APPEARANCEUR CLOUDY (A) 05/14/2019 0000    LABSPEC 1.023 05/14/2019 0000   PHURINE 5.0 05/14/2019 0000   GLUCOSEU NEGATIVE 05/14/2019 0000   HGBUR LARGE (A) 05/14/2019 0000   BILIRUBINUR NEGATIVE 05/14/2019 0000   KETONESUR NEGATIVE 05/14/2019 0000   PROTEINUR 100 (A) 05/14/2019 0000   UROBILINOGEN 0.2 06/10/2015 1430   NITRITE NEGATIVE 05/14/2019 0000   LEUKOCYTESUR NEGATIVE 05/14/2019 0000   Sepsis Labs: @LABRCNTIP (procalcitonin:4,lacticidven:4)  )No results found for this or any previous visit (from the past 240 hour(s)).       Radiology Studies: Dg Abd 1 View  Result Date: 06/09/2019 CLINICAL DATA:  Orogastric tube placement. EXAM: ABDOMEN - 1 VIEW COMPARISON:  November 03, 2016. FINDINGS: The bowel gas pattern is normal. Distal tip of enteric tube is seen at gastroesophageal junction. No radio-opaque calculi or other significant radiographic abnormality are seen. IMPRESSION: Distal tip of enteric tube seen at gastroesophageal junction. Advancement is recommended. No abnormal bowel gas pattern is noted. Electronically Signed   By: Marijo Conception M.D.   On: 06/09/2019 12:12   Dg Chest Port 1 View  Result Date: 06/09/2019 CLINICAL DATA:  Post intubation. COVID-19 positive patient. EXAM: PORTABLE CHEST 1 VIEW COMPARISON:  June 08, 2019 FINDINGS: Endotracheal tube in satisfactory radiographic position. Enteric catheter with side hole overlying the expected location of the gastric cardia. Dual lead cardiac pacemaker in stable position. Cardiomediastinal silhouette is normal. Mediastinal contours appear intact. Subtle patchy bilateral ground-glass opacities with lower lobe predominance. Osseous structures are without acute abnormality. Soft tissues are grossly normal. IMPRESSION: 1. Subtle patchy bilateral ground-glass opacities with lower lobe predominance. 2. Endotracheal tube in satisfactory position. 3. Enteric catheter with side hole overlying the expected location of the gastric cardia. Electronically Signed   By:  Fidela Salisbury M.D.   On: 06/09/2019 10:04   Dg Abd Portable 1v  Result Date: 06/09/2019 CLINICAL DATA:  OG tube. EXAM: PORTABLE ABDOMEN - 1 VIEW COMPARISON:  Prior study same day.  FINDINGS: OG tube tip in the upper stomach. Side hole at the gastroesophageal junction. Subsequent repositioning was performed. No bowel distention. Cardiac pacer noted IMPRESSION: OG tube with position as above. Electronically Signed   By: Marcello Moores  Register   On: 06/09/2019 12:43   Dg Abd Portable 1v  Result Date: 06/09/2019 CLINICAL DATA:  OG tube positioning. EXAM: PORTABLE ABDOMEN - 1 VIEW COMPARISON:  Prior study same day. FINDINGS: OG tube tip noted in upper stomach. Side hole at the gastroesophageal junction. Subsequent repositioning was performed as demonstrated by later image. No bowel distention. Cardiac pacer noted. IMPRESSION: OG tube position as above. Electronically Signed   By: Marcello Moores  Register   On: 06/09/2019 12:43   Dg Abd Portable 1v  Result Date: 06/09/2019 CLINICAL DATA:  OG tube repositioning. EXAM: PORTABLE ABDOMEN - 1 VIEW COMPARISON:  Prior study same day. FINDINGS: Interval advancement of OG tube with side hole and tip over the upper stomach. No bowel distention. Cardiac pacer noted. IMPRESSION: Number vascular OG tube with side hole and tip over the upper stomach. Electronically Signed   By: [place]   On: 06/09/2019 12:41        Scheduled Meds: . aspirin  81 mg Oral Daily  . atorvastatin  80 mg Oral q1800  . chlorhexidine  15 mL Mouth/Throat BID  . Chlorhexidine Gluconate Cloth  6 each Topical Daily  . dexamethasone (DECADRON) injection  6 mg Intravenous Q24H  . enoxaparin (LOVENOX) injection  40 mg Subcutaneous Q12H  . escitalopram  20 mg Per Tube Daily  . feeding supplement (PRO-STAT SUGAR FREE 64)  30 mL Per Tube QID  . ipratropium-albuterol  3 mL Nebulization Q6H  . mouth rinse  15 mL Mouth Rinse 10 times per day  . multivitamin with minerals  1 tablet Oral Daily   . pantoprazole sodium  40 mg Per Tube Daily  . vitamin C  500 mg Oral Daily  . zinc sulfate  220 mg Oral Daily   Continuous Infusions: . [START ON 06/11/2019] ampicillin (OMNIPEN) IV    . feeding supplement (VITAL AF 1.2 CAL)    . norepinephrine (LEVOPHED) Adult infusion 2 mcg/min (06/10/19 0600)  . propofol (DIPRIVAN) infusion 30 mcg/kg/min (06/10/19 0600)  . remdesivir 100 mg in NS 250 mL       LOS: 1 day   The patient is critically ill with multiple organ systems failure and requires high complexity decision making for assessment and support, frequent evaluation and titration of therapies, application of advanced monitoring technologies and extensive interpretation of multiple databases. Critical Care Time devoted to patient care services described in this note  Time spent: 40 minutes     WOODS, Geraldo Docker, MD [facility] Pager [phone]  If 7PM-7AM, please contact night-coverage [email or web address] Password [facility] 06/10/2019, 7:34 AM

## 2019-06-10 NOTE — Progress Notes (Signed)
[facility]  I called his daughter [name] to let her know that we were able to extubate him.  She voiced understanding. Questions answered.   Roselie Awkward, MD [place] [facility] Pager: [phone] Cell: [phone] If no response, call [phone]

## 2019-06-10 NOTE — Evaluation (Signed)
Physical Therapy Evaluation Patient Details Name: [name] MRN: [id number] DOB: [birth year] Today's Date: 06/10/2019   History of Present Illness  84 y/o male admitted from [facility] for ARDS from COVID PNA. Lives in SNF at baseline and has multi comorbidities. Hx: anemia, angina, on anticoagulant, atherosclerosis, CAD, HTN, HLD, COPD, CHF EF 35-40%, CKD, MI, prostate cancer ICD (06/13). was intubated until 10/16.  Clinical Impression   Pt admitted with above diagnosis. PTA pt was at [facility] and needed assist with all functional mob and ADLs. Pt currently with functional limitations due to the deficits listed below (see PT Problem List).He is at max a -total a with functional mob/activities. Pt appears to be confused and does not verbalize anything to therapist nor current nurse.  Pt will benefit from skilled PT to increase his overall strength, balance and coordination, activity tolerance, independence and safety with mobility to allow discharge to the venue listed below.       Follow Up Recommendations SNF    Equipment Recommendations  None recommended by PT    Recommendations for Other Services OT consult     Precautions / Restrictions Precautions Precautions: Fall Restrictions Weight Bearing Restrictions: No      Mobility  Bed Mobility Overal bed mobility: Needs Assistance Bed Mobility: Rolling;Supine to Sit;Sit to Supine Rolling: Max assist;Total assist   Supine to sit: Total assist;Max assist Sit to supine: Max assist;Total assist      Transfers Overall transfer level: Needs assistance               General transfer comment: did not attempt to transfer at this time as pt is quite weak and unable to follow instructions during assessment  Ambulation/Gait             General Gait Details: did not attempt ambulation at this time for safety  Stairs            Wheelchair Mobility    Modified Rankin (Stroke Patients Only)       Balance Overall  balance assessment: Needs assistance Sitting-balance support: Bilateral upper extremity supported;Feet supported Sitting balance-Leahy Scale: Poor Sitting balance - Comments: sits EOB with max a and cues, minimally able to hold himself up briefly but then falls backwards Postural control: Left lateral lean;Posterior lean     Standing balance comment: unable to stand without total a at this time                             Pertinent Vitals/Pain Pain Assessment: Faces Faces Pain Scale: Hurts even more Pain Location: LUE/LLE Pain Descriptors / Indicators: Other (Comment);Tender;Grimacing;Guarding(unable) Pain Intervention(s): Limited activity within patient's tolerance    Home Living Family/patient expects to be discharged to:: Other (Comment)(was at SNF prior to rehospitalization) Living Arrangements: Spouse/significant other;Children Available Help at Discharge: Family             Additional Comments: unable to obtain all info at this time as pt is confused and not verbalizing    Prior Function Level of Independence: Needs assistance         Comments: not all details are available at this time, but prior to this hospitalization pt was staying at [facility] and needing A with all functional mob and ADLs     Hand Dominance        Extremity/Trunk Assessment   Upper Extremity Assessment Upper Extremity Assessment: Generalized weakness    Lower Extremity Assessment Lower Extremity Assessment: Generalized weakness  Communication   Communication: Receptive difficulties;Expressive difficulties;Other (comment)(does not verbalize with this therapist nor current nurse)  Cognition Arousal/Alertness: Lethargic Behavior During Therapy: Flat affect Overall Cognitive Status: Difficult to assess                                        General Comments General comments (skin integrity, edema, etc.): PTA pt was hospitalized several times, he was at  SNF prior to being rehospitalized this time. Pt is quite lethargic and minimally able to participate in assessment this pm. Pt is needing max-total a with all functional mobility. He was able to roll L/R to complete clean up after incontinent episode, max a for supine<>sit EOB, minimally able to hold himself up w/ sitting EOB but mostly max a to maintain sitting. Pt on 9L HFNC this pm and is able to maintain sats in 90s throughout.    Exercises Other Exercises Other Exercises: AAROM to BLE in supine   Assessment/Plan    PT Assessment Patient needs continued PT services(while in hospital)  PT Problem List Decreased strength;Decreased range of motion;Decreased activity tolerance;Decreased balance;Decreased mobility;Decreased coordination;Decreased cognition;Decreased safety awareness       PT Treatment Interventions Gait training;Functional mobility training;Therapeutic activities;Therapeutic exercise;Neuromuscular re-education;Balance training;Patient/family education    PT Goals (Current goals can be found in the Care Plan section)  Acute Rehab PT Goals Time For Goal Achievement: 06/24/19 Potential to Achieve Goals: Fair    Frequency Min 2X/week   Barriers to discharge   impaired functional mob prior to hospitalization    Co-evaluation               AM-PAC PT "6 Clicks" Mobility  Outcome Measure Help needed turning from your back to your side while in a flat bed without using bedrails?: Total Help needed moving from lying on your back to sitting on the side of a flat bed without using bedrails?: Total Help needed moving to and from a bed to a chair (including a wheelchair)?: Total Help needed standing up from a chair using your arms (e.g., wheelchair or bedside chair)?: Total Help needed to walk in hospital room?: Total Help needed climbing 3-5 steps with a railing? : Total 6 Click Score: 6    End of Session Equipment Utilized During Treatment: Oxygen Activity Tolerance:  Treatment limited secondary to medical complications (Comment);Patient limited by lethargy;Patient limited by fatigue;No increased pain Patient left: in bed;with call bell/phone within reach;with nursing/sitter in room Nurse Communication: Mobility status;Other (comment)(nurse assisted with some mobility) PT Visit Diagnosis: Other abnormalities of gait and mobility (R26.89);Muscle weakness (generalized) (M62.81);Pain Pain - Right/Left: Left Pain - part of body: Shoulder;Arm;Leg    Time: 1470-9295 PT Time Calculation (min) (ACUTE ONLY): 34 min   Charges:   PT Evaluation $PT Eval High Complexity: 1 High PT Treatments $Therapeutic Activity: 8-22 mins        Horald Chestnut, PT    Delford Field 06/10/2019, 4:13 PM

## 2019-06-10 NOTE — Procedures (Signed)
Extubation Procedure Note  Patient Details:   Name: [name] DOB: 1934/11/10 MRN: [id number]   Airway Documentation:    Vent end date: 06/10/19 Vent end time: 1055   Evaluation  O2 sats: stable throughout Complications: No apparent complications Patient did tolerate procedure well. Bilateral Breath Sounds: Diminished   Yes   Pt extubated to 5L increased to 8L HFNC post extubation due to low spo2. Pt in n distress, no increased WOB, VS within normal limits.Positive cuff leak noted prior to extubation. Able to phonate but not following commands, unable/unwilling to cough post extubation. No stridor noted. RT will continue to monitor   [name] 06/10/2019, 11:09 AM

## 2019-06-10 NOTE — Progress Notes (Signed)
   Alwyn Pea (daughter) [phone].   Is spokesperson for family and will update everyone. Did let her know we could do face time with 4 people on at a time.  [facility] DPR signed authorizing all [facility] to release patient's PHI to Bethena Roys (wife) [phone] and Ronalee Belts or Edwena Blow [phone] (son and daughter-in-law) and Alwyn Pea (daughter) [phone]. Patient also authorizes All [facility] Practices to leave a detailed message on his home answering machine [phone]) and his cell phone voice mail [phone]

## 2019-06-10 NOTE — Evaluation (Signed)
Clinical/Bedside Swallow Evaluation Patient Details  Name: GORJE IYER MRN: [id number] Date of Birth: 1935-02-06  Today's Date: 06/10/2019 Time: SLP Start Time (ACUTE ONLY): 49 SLP Stop Time (ACUTE ONLY): 1422 SLP Time Calculation (min) (ACUTE ONLY): 12 min  Past Medical History:  Past Medical History:  Diagnosis Date  . Anemia   . Angina   . Anticoagulated on warfarin 07/17/2011  . Arthritis   . Asthma   . Atherosclerosis of native arteries of the extremities with intermittent claudication 08/13/2011  . Blood transfusion 3 weeks ago  . CAD 04/23/2009  . CHF (congestive heart failure) ([place])   . Chronic kidney disease   . COPD (chronic obstructive pulmonary disease) ([place])   . GERD (gastroesophageal reflux disease)   . Heart attack [facility]) October 27,2012 and July 28, 2011  . Hypertension   . Myocardial infarction ([place])   . Prostate cancer ([place])   . S/P ICD (internal cardiac defibrillator) procedure, 6.17.13 02/10/2012  . Ventricular thrombus following MI (myocardial infarction) ([place]) 11/12   on coumadin   Past Surgical History:  Past Surgical History:  Procedure Laterality Date  . CARDIAC CATHETERIZATION    . COLONOSCOPY N/A 03/23/2019   Procedure: COLONOSCOPY;  Surgeon: Daneil Dolin, MD;  [facility];  Service: Endoscopy;  Laterality: N/A;  . CORONARY ANGIOGRAM N/A 07/28/2011   Procedure: CORONARY ANGIOGRAM;  Surgeon: Leonie Man, MD;  Location: [facility] CATH LAB;  Service: Cardiovascular;  Laterality: N/A;  . CORONARY ANGIOPLASTY    . CORONARY STENT PLACEMENT  06/21/11  . FLEXIBLE SIGMOIDOSCOPY N/A 03/22/2019   Procedure: FLEXIBLE SIGMOIDOSCOPY;  Surgeon: Danie Binder, MD;  [facility];  Service: Endoscopy;  Laterality: N/A;  . GROIN DEBRIDEMENT  07/18/2011   Procedure: [name] DEBRIDEMENT;  Surgeon: Angelia Mould, MD;  Location: [facility] OR;  Service: Vascular;  Laterality: Right;  exploration of right groin,evacuation of right groin  hematoma & repair of right superficial femoral artery  . IMPLANTABLE CARDIOVERTER DEFIBRILLATOR IMPLANT N/A 02/09/2012   Procedure: IMPLANTABLE CARDIOVERTER DEFIBRILLATOR IMPLANT;  Surgeon: Sanda Klein, MD;  Location: [facility] CATH LAB;  Service: Cardiovascular;  Laterality: N/A;  . INSERT / REPLACE / REMOVE PACEMAKER  02/09/2012   ICD  . LEAD REVISION N/A 02/10/2012   Procedure: LEAD REVISION;  Surgeon: Sanda Klein, MD;  Location: [place] CATH LAB;  Service: Cardiovascular;  Laterality: N/A;  . PERCUTANEOUS CORONARY STENT INTERVENTION (PCI-S) N/A 07/28/2011   Procedure: PERCUTANEOUS CORONARY STENT INTERVENTION (PCI-S);  Surgeon: Leonie Man, MD;  Location: [facility] CATH LAB;  Service: Cardiovascular;  Laterality: N/A;  . POLYPECTOMY  03/23/2019   Procedure: POLYPECTOMY;  Surgeon: Daneil Dolin, MD;  [facility];  Service: Endoscopy;;  cecum, transverse, descending   HPI:  84 y/o male admitted from [facility] on 10/15 after being intubated for mechanical ventilation in setting of ARDS from COVID 19. Intubated 10/14-10/16. At baseline lives in a nursing home, has multiple comorbid illnesses.   Assessment / Plan / Recommendation Clinical Impression  Pt demonstrated subjective tolerance of thin liquids after 2-3 day intubation. He did speak minimally and vocal quality was clear. He also followed all oral motor commands. He did not follow ocmmands for 3 oz water swallow, but he did drink three large straw sips consecutively x2 without signs of aspiration. Also tolerated puree well. Pt does not have dentition so further solids were not trialed. Will initiate a puree diet and thin liquids, meds can be given whole in puree. Will f/u  for diet advancement.  SLP Visit Diagnosis: Dysphagia, unspecified (R13.10)    Aspiration Risk  Mild aspiration risk    Diet Recommendation Dysphagia 1 (Puree);Thin liquid   Liquid Administration via: Cup;Straw Medication Administration: Whole meds with  puree Supervision: Staff to assist with self feeding Compensations: Slow rate;Small sips/bites Postural Changes: Seated upright at 90 degrees    Other  Recommendations Oral Care Recommendations: Oral care BID   Follow up Recommendations Skilled Nursing facility      Frequency and Duration min 2x/week  1 week       Prognosis        Swallow Study   General HPI: 84 y/o male admitted from [facility] on 10/15 after being intubated for mechanical ventilation in setting of ARDS from COVID 19. Intubated 10/14-10/16. At baseline lives in a nursing home, has multiple comorbid illnesses. Type of Study: Bedside Swallow Evaluation Previous Swallow Assessment: none in chart Diet Prior to this Study: NPO Temperature Spikes Noted: No Respiratory Status: Nasal cannula History of Recent Intubation: Yes Length of Intubations (days): 2 days Date extubated: 06/10/19 Behavior/Cognition: Alert;Cooperative Oral Cavity Assessment: Within Functional Limits Oral Care Completed by SLP: No Oral Cavity - Dentition: Edentulous Self-Feeding Abilities: Needs assist Patient Positioning: Upright in bed Baseline Vocal Quality: Normal Volitional Cough: Cognitively unable to elicit Volitional Swallow: Unable to elicit    Oral/Motor/Sensory Function Overall Oral Motor/Sensory Function: Within functional limits   Ice Chips Ice chips: Within functional limits   Thin Liquid Thin Liquid: Within functional limits Presentation: Straw;Cup    Nectar Thick Nectar Thick Liquid: Not tested   Honey Thick Honey Thick Liquid: Not tested   Puree Puree: Within functional limits Presentation: Spoon   Solid     Solid: Not tested     Herbie Baltimore, MA [place] Pager [phone] Office [phone]  Jolee Critcher, Katherene Ponto 06/10/2019,2:41 PM

## 2019-06-10 NOTE — Progress Notes (Signed)
NAME:  [name], MRN:  [id number], DOB:  Jan 19, 1935, LOS: 1 ADMISSION DATE:  06/09/2019, CONSULTATION DATE:  10/15 REFERRING MD:  Olevia Bowens, CHIEF COMPLAINT:  dyspnea   Brief History   84 y/o male admitted from [facility] on 10/15 after being intubated for mechanical ventilation in setting of ARDS from COVID 19.  History of present illness   84 year old male admitted overnight after being intubated at [facility] rocking him for ARDS from COVID-19 pneumonia.  At baseline lives in a nursing home, has multiple comorbid illnesses.  Cannot take history due to intubated status.  Past Medical History  Coronary artery disease Hypertension Hyperlipidemia Congestive heart failure, systolic, LVEF 07-12% 1975 TTE CKD  Significant Hospital Events   10/14 intubated [facility] 10/15 admitted [facility]  Consults:  PCCM  Procedures:  10/14 ETT > 10/16  Significant Diagnostic Tests:    Micro Data:  10/5 SARS COV 2 Positive at [facility])  Antimicrobials/COVID Rx:  10/15 Decadron>  10/15 Remdesivir >  10/15 levaquin x1 10/15 ampicillin  Interim history/subjective:   Weaning on vent Some agitation  Objective   Blood pressure 126/80, pulse 96, temperature 99 F (37.2 C), temperature source Oral, resp. rate (!) 23, height 5\' 8"  (1.727 m), weight 82 kg, SpO2 91 %.    Vent Mode: PRVC FiO2 (%):  [40 %] 40 % Set Rate:  [20 bmp] 20 bmp Vt Set:  [480 mL] 480 mL PEEP:  [5 cmH20] 5 cmH20 Pressure Support:  [12 cmH20] 12 cmH20 Plateau Pressure:  [15 cmH20-23 cmH20] 21 cmH20   Intake/Output Summary (Last 24 hours) at 06/10/2019 1025 Last data filed at 06/10/2019 0900 Gross per 24 hour  Intake 399.41 ml  Output 730 ml  Net -330.59 ml   Filed Weights   06/09/19 0600 06/10/19 0500  Weight: 81.5 kg 82 kg    Examination:  General:  In bed on vent HENT: NCAT ETT in place PULM: CTA B, vent supported breathing CV: RRR, no mgr GI: BS+, soft, nontender MSK: normal bulk and tone Neuro: sedated  on vent   CXR images reviewed today: minimal infiltrate, ETT in place   Resolved Hospital Problem list    Assessment & Plan:  Acute respiratory failure with hypoxemia due to COVID-19 pneumonia:  Decadron for 10-day course Remdesivir for 5-day course Extubate today Stop sedation Aspiration precautions after extubation  Recent enterococcal UTI prior to admission Ampicillin  Need for sedation for mechanical ventilation Stop sedation protocol  Acute on chronic kidney failure: improved Monitor BMET and UOP Replace electrolytes as needed   Best practice:  Diet: NPO after extubation, SLP evaluation Pain/Anxiety/Delirium protocol (if indicated): d/c VAP protocol (if indicated): d/c DVT prophylaxis: sub q heparin per protocol GI prophylaxis: Pantoprazole for stress ulcer prophylaxis Glucose control: SSI Mobility: bed rest Code Status: full Family Communication: will update his family today Disposition: remain in ICU  Labs   CBC: Recent Labs  Lab 06/09/19 0449 06/09/19 0535 06/10/19 0550 06/10/19 0624  WBC  --  9.3 9.6  --   NEUTROABS  --  8.2* 8.2*  --   HGB 10.5* 10.4* 9.9* 10.2*  HCT 31.0* 36.9* 34.7* 30.0*  MCV  --  97.4 96.4  --   PLT  --  196 155  --     Basic Metabolic Panel: Recent Labs  Lab 06/09/19 0449 06/09/19 0535 06/09/19 1650 06/10/19 0550 06/10/19 0624  NA 150* 150*  --  154* 152*  K 3.9 4.0  --  3.6 3.5  CL  --  109  --  114*  --   CO2  --  29  --  27  --   GLUCOSE  --  239*  --  165*  --   BUN  --  44*  --  48*  --   CREATININE  --  2.07*  --  1.72*  --   CALCIUM  --  8.0*  --  8.4*  --   MG  --  2.3 2.3 2.4  --   PHOS  --  2.8 2.6 2.5  --    GFR: Estimated Creatinine Clearance: 32 mL/min (A) (by C-G formula based on SCr of 1.72 mg/dL (H)). Recent Labs  Lab 06/09/19 0535 06/10/19 0550  PROCALCITON <0.10  --   WBC 9.3 9.6    Liver Function Tests: Recent Labs  Lab 06/09/19 0535 06/10/19 0550  AST 30 28  ALT 25 24   ALKPHOS 61 56  BILITOT 0.9 0.8  PROT 6.2* 6.1*  ALBUMIN 2.8* 2.8*   No results for input(s): LIPASE, AMYLASE in the last 168 hours. Recent Labs  Lab 06/09/19 0535  AMMONIA 22    ABG    Component Value Date/Time   PHART 7.421 06/10/2019 0624   PCO2ART 44.4 06/10/2019 0624   PO2ART 64.0 (L) 06/10/2019 0624   HCO3 28.9 (H) 06/10/2019 0624   TCO2 30 06/10/2019 0624   ACIDBASEDEF 1.0 06/28/2011 2124   O2SAT 93.0 06/10/2019 0624     Coagulation Profile: No results for input(s): INR, PROTIME in the last 168 hours.  Cardiac Enzymes: No results for input(s): CKTOTAL, CKMB, CKMBINDEX, TROPONINI in the last 168 hours.  HbA1C: Hgb A1c MFr Bld  Date/Time Value Ref Range Status  07/28/2011 12:05 PM 5.2 <5.7 % Final    Comment:    (NOTE)                                                                       According to the ADA Clinical Practice Recommendations for 2011, when HbA1c is used as a screening test:  >=6.5%   Diagnostic of Diabetes Mellitus           (if abnormal result is confirmed) 5.7-6.4%   Increased risk of developing Diabetes Mellitus References:Diagnosis and Classification of Diabetes Mellitus,Diabetes Care,2011,34(Suppl 1):S62-S69 and Standards of Medical Care in         Diabetes - 2011,Diabetes WUJW,1191,47 (Suppl 1):S11-S61.  06/21/2011 01:00 PM 6.0 (H) <5.7 % Final    Comment:    (NOTE)                                                                       According to the ADA Clinical Practice Recommendations for 2011, when HbA1c is used as a screening test:  >=6.5%   Diagnostic of Diabetes Mellitus           (if abnormal result is confirmed) 5.7-6.4%   Increased risk of developing Diabetes Mellitus References:Diagnosis and Classification of Diabetes  [place],Diabetes JJKK,9381,82(XHBZJ 1):S62-S69 and Standards of Medical Care in         Diabetes - 2011,Diabetes IRCV,8938,10 (Suppl 1):S11-S61.    CBG: Recent Labs  Lab 06/09/19 1743 06/09/19 1936  06/10/19 0011 06/10/19 0351 06/10/19 0819  GLUCAP 142* 136* 161* 147* 136*       Critical care time: 35 minutes     Roselie Awkward, MD [place] [facility] Pager: [phone] Cell: [phone] If no response, call [phone]

## 2019-06-11 DIAGNOSIS — E870 Hyperosmolality and hypernatremia: Secondary | ICD-10-CM

## 2019-06-11 DIAGNOSIS — I5022 Chronic systolic (congestive) heart failure: Secondary | ICD-10-CM

## 2019-06-11 LAB — CBC WITH DIFFERENTIAL/PLATELET
Abs Immature Granulocytes: 0.22 10*3/uL — ABNORMAL HIGH (ref 0.00–0.07)
Basophils Absolute: 0 10*3/uL (ref 0.0–0.1)
Basophils Relative: 0 %
Eosinophils Absolute: 0 10*3/uL (ref 0.0–0.5)
Eosinophils Relative: 0 %
HCT: 33.9 % — ABNORMAL LOW (ref 39.0–52.0)
Hemoglobin: 9.5 g/dL — ABNORMAL LOW (ref 13.0–17.0)
Immature Granulocytes: 2 %
Lymphocytes Relative: 6 %
Lymphs Abs: 0.7 10*3/uL (ref 0.7–4.0)
MCH: 27.5 pg (ref 26.0–34.0)
MCHC: 28 g/dL — ABNORMAL LOW (ref 30.0–36.0)
MCV: 98 fL (ref 80.0–100.0)
Monocytes Absolute: 0.7 10*3/uL (ref 0.1–1.0)
Monocytes Relative: 6 %
Neutro Abs: 9.4 10*3/uL — ABNORMAL HIGH (ref 1.7–7.7)
Neutrophils Relative %: 86 %
Platelets: 151 10*3/uL (ref 150–400)
RBC: 3.46 MIL/uL — ABNORMAL LOW (ref 4.22–5.81)
RDW: 14.7 % (ref 11.5–15.5)
WBC: 11 10*3/uL — ABNORMAL HIGH (ref 4.0–10.5)
nRBC: 0 % (ref 0.0–0.2)

## 2019-06-11 LAB — PHOSPHORUS: Phosphorus: 3.9 mg/dL (ref 2.5–4.6)

## 2019-06-11 LAB — COMPREHENSIVE METABOLIC PANEL
ALT: 24 U/L (ref 0–44)
AST: 25 U/L (ref 15–41)
Albumin: 2.6 g/dL — ABNORMAL LOW (ref 3.5–5.0)
Alkaline Phosphatase: 51 U/L (ref 38–126)
Anion gap: 8 (ref 5–15)
BUN: 44 mg/dL — ABNORMAL HIGH (ref 8–23)
CO2: 33 mmol/L — ABNORMAL HIGH (ref 22–32)
Calcium: 8 mg/dL — ABNORMAL LOW (ref 8.9–10.3)
Chloride: 118 mmol/L — ABNORMAL HIGH (ref 98–111)
Creatinine, Ser: 1.31 mg/dL — ABNORMAL HIGH (ref 0.61–1.24)
GFR calc Af Amer: 58 mL/min — ABNORMAL LOW (ref >60)
GFR calc non Af Amer: 50 mL/min — ABNORMAL LOW (ref >60)
Glucose, Bld: 161 mg/dL — ABNORMAL HIGH (ref 70–99)
Potassium: 4 mmol/L (ref 3.5–5.1)
Sodium: 159 mmol/L — ABNORMAL HIGH (ref 135–145)
Total Bilirubin: 0.4 mg/dL (ref 0.3–1.2)
Total Protein: 5.6 g/dL — ABNORMAL LOW (ref 6.5–8.1)

## 2019-06-11 LAB — C-REACTIVE PROTEIN: CRP: 1 mg/dL — ABNORMAL HIGH (ref <1.0)

## 2019-06-11 LAB — FERRITIN: Ferritin: 238 ng/mL (ref 24–336)

## 2019-06-11 LAB — MAGNESIUM: Magnesium: 2.5 mg/dL — ABNORMAL HIGH (ref 1.7–2.4)

## 2019-06-11 LAB — D-DIMER, QUANTITATIVE: D-Dimer, Quant: 0.58 ug/mL-FEU — ABNORMAL HIGH (ref 0.00–0.50)

## 2019-06-11 LAB — MRSA PCR SCREENING: MRSA by PCR: POSITIVE — AB

## 2019-06-11 MED ORDER — ORAL CARE MOUTH RINSE
15.0000 mL | Freq: Two times a day (BID) | OROMUCOSAL | Status: DC
  Administered 2019-06-11 – 2019-06-19 (×17): 15 mL via OROMUCOSAL

## 2019-06-11 MED ORDER — DEXTROSE 5 % IV SOLN
INTRAVENOUS | Status: DC
  Administered 2019-06-11 – 2019-06-14 (×3): via INTRAVENOUS

## 2019-06-11 MED ORDER — MUPIROCIN 2 % EX OINT
1.0000 "application " | TOPICAL_OINTMENT | Freq: Two times a day (BID) | CUTANEOUS | Status: AC
  Administered 2019-06-11 – 2019-06-15 (×10): 1 via NASAL
  Filled 2019-06-11: qty 22

## 2019-06-11 NOTE — Progress Notes (Signed)
MRSA screen came back positive therefore standing orders for MRSA positive placed.

## 2019-06-11 NOTE — Progress Notes (Addendum)
PROGRESS NOTE    [name]  FYB:[id number] DOB: [birth year] DOA: 06/09/2019 PCP: Redmond School, MD   Brief Narrative:  84 y.o. WM PMHx  anemia,  asthma/COPD,CAD, history MI, history of post MI ventricular thrombus (, anticoagulated on warfarin) chronic systolic heart failure, HTN, s/p ICD placement, CKD stage III, GERD,  prostate cancer,,  who is coming to this facility from the emergency department from [facility] after presenting there due to dyspnea and AMS.  The patient has being intubated.  He was in that facility admitted from September 22 through October 3.  He was diagnosed on 10/05 with COVID-19.  Per sending facility physician, there is an outbreak of SARS 2 his nursing facility [facility]).  No further history is available.  ED Course: At the time of transfer presentation, vital signs at the facility where BP 96/72, heart rate 73, RR 14 and O2 sat 99%.  The patient was still on Levophed and Versed infusion.  His most recent ABG PH 7.35, PCO2 58, PO2 329, bicarbonate 32, TCO2 33.8, BE 5.9, O2 sat 99.5. His CBC showed a white count was 10, hemoglobin 11.9 g/dL and platelets 229.  BUN and creatinine were increasing from yesterday 39/1.5 to 2-day 46/1 0.9 mg/dL.  LFTs were reported normal.  PT was 15.4 seconds and INR 1.5.  Ammonia level was 45 mol/L.He is proBNP was 1411 and CRP 14.55 mg/dL.  He is urinalysis showed leukocyturia and he has a recent urine culture showing pansensitive Enterococcus.  Imaging is significant for chronic COPD changes and LLL infiltrate.  Please see records from [facility] for further detail.   Subjective: 8/17 Sleepy but arousable, A/O x1 (will not answer as to where, when, why).  Will nod yes and no to questions and follow commands.  Negative CP, negative S OB, negative abdominal pain.   Assessment & Plan:   Principal Problem:   Acute respiratory distress syndrome (ARDS) due to COVID-19 virus [facility]) Active Problems:   Essential hypertension  Coronary atherosclerosis   Chronic systolic congestive heart failure   CKD (chronic kidney disease) stage 3, GFR 30-59 ml/min (HCC)   COPD (chronic obstructive pulmonary disease) (HCC)   AKI (acute kidney injury) (HCC)   Urinary tract infection   Acute respiratory failure with hypoxia (HCC)   Pneumonia due to COVID-19 virus   Acute renal failure superimposed on stage 3b chronic kidney disease (HCC)   Enterococcus UTI   Hypernatremia  Acute respiratory failure with hypoxia/Covid pneumonia -Remdesivir per pharmacy protocol -Decadron 6 mg daily -Combivent QID -Flutter valve -10/16 extubated  COVID-19 Labs  Recent Labs    06/09/19 0535 06/10/19 0550 06/11/19 0545  DDIMER 0.55* 0.49 0.58*  FERRITIN 356* 322 238  LDH 253*  --   --   CRP 2.9* 2.2* 1.0*    Lab Results  Component Value Date   [place] NEGATIVE 03/22/2019   ARDS -See Covid pneumonia  COPD -See Covid pneumonia  Acute on CKD stage III (baseline Cr ~1.7) Recent Labs  Lab 06/09/19 0535 06/10/19 0550 06/11/19 0545  CREATININE 2.07* 1.72* 1.31*  -Improving.  Hydrate slowly.  See hypernatremia  Enterococcus UTI (pansensitive) -Complete 5-day course antibiotics  Chronic systolic CHF -Strict in and out +748.17ml -Daily weight Filed Weights   06/09/19 0600 06/10/19 0500  Weight: 81.5 kg 82 kg  -Transfuse for hemoglobin<7  Essential HTN -Was on Levophed, now DC'd -Currently controlled without BP medication monitor closely  CAD -Aspirin 81 mg daily, -Lipitor 80 mg daily  Hypernatremia - D5W  37ml/hr    Goals of care -10/16 PT/OT consult placed; evaluate for CIR vs SNF      DVT prophylaxis: Subcu Lovenox Code Status: Full Family Communication:/10/17 Dr. Marchelle Gearing spoke with family Disposition Plan: TBD   Consultants:  PCCM   Procedures/Significant Events:     I have personally reviewed and interpreted all radiology studies and my findings are as above.  VENTILATOR  SETTINGS: Nasal cannula Flow rate; 4 L/min    Cultures ??  Antimicrobials: Anti-infectives (From admission, onward)   Start     Stop   06/11/19 0900  ampicillin (OMNIPEN) 2 g in sodium chloride 0.9 % 100 mL IVPB         06/10/19 1000  remdesivir 100 mg in sodium chloride 0.9 % 250 mL IVPB     06/14/19 0959   06/09/19 1400  ampicillin (OMNIPEN) 2 g in sodium chloride 0.9 % 100 mL IVPB  Status:  Discontinued     06/09/19 1209   06/09/19 1000  remdesivir 200 mg in sodium chloride 0.9 % 250 mL IVPB     06/09/19 1127   06/09/19 0800  levofloxacin (LEVAQUIN) IVPB 750 mg  Status:  Discontinued     06/09/19 1206       Devices    LINES / TUBES:      Continuous Infusions: . ampicillin (OMNIPEN) IV Stopped (06/11/19 1452)  . dextrose 75 mL/hr at 06/11/19 1600  . feeding supplement (VITAL AF 1.2 CAL)    . norepinephrine (LEVOPHED) Adult infusion Stopped (06/10/19 1022)  . remdesivir 100 mg in NS 250 mL Stopped (06/11/19 1051)     Objective: Vitals:   06/11/19 1300 06/11/19 1400 06/11/19 1500 06/11/19 1600  BP: 130/71 128/72 138/75 140/79  Pulse: 95 86 89 91  Resp: 20 (!) 21 (!) 23 (!) 21  Temp:    97.9 F (36.6 C)  TempSrc:    Oral  SpO2: 95% 96% 98% 97%  Weight:      Height:        Intake/Output Summary (Last 24 hours) at 06/11/2019 1753 Last data filed at 06/11/2019 1600 Gross per 24 hour  Intake 1841.15 ml  Output 1200 ml  Net 641.15 ml   Filed Weights   06/09/19 0600 06/10/19 0500  Weight: 81.5 kg 82 kg  Physical Exam:  General: Sleepy but arousable, A/O x1 (refuses to answer where, when, why), positive acute respiratory distress Eyes: negative scleral hemorrhage, negative anisocoria, negative icterus ENT: Negative Runny nose, negative gingival bleeding, Neck:  Negative scars, masses, torticollis, lymphadenopathy, JVD Lungs: Clear to auscultation bilaterally without wheezes or crackles Cardiovascular: Regular rate and rhythm without murmur gallop or  rub normal S1 and S2 Abdomen: negative abdominal pain, nondistended, positive soft, bowel sounds, no rebound, no ascites, no appreciable mass Extremities: No significant cyanosis, clubbing, or edema bilateral lower extremities Skin: Negative rashes, lesions, ulcers Psychiatric: Unable to evaluate patient will not answer/cannot answer questions Central nervous system: Very weak but moves all extremities to commands      Data Reviewed: Care during the described time interval was provided by me .  I have reviewed this patient's available data, including medical history, events of note, physical examination, and all test results as part of my evaluation.   CBC: Recent Labs  Lab 06/09/19 0449 06/09/19 0535 06/10/19 0550 06/10/19 0624 06/11/19 0545  WBC  --  9.3 9.6  --  11.0*  NEUTROABS  --  8.2* 8.2*  --  9.4*  HGB 10.5* 10.4*  9.9* 10.2* 9.5*  HCT 31.0* 36.9* 34.7* 30.0* 33.9*  MCV  --  97.4 96.4  --  98.0  PLT  --  196 155  --  086   Basic Metabolic Panel: Recent Labs  Lab 06/09/19 0449 06/09/19 0535 06/09/19 1650 06/10/19 0550 06/10/19 0624 06/11/19 0545  NA 150* 150*  --  154* 152* 159*  K 3.9 4.0  --  3.6 3.5 4.0  CL  --  109  --  114*  --  118*  CO2  --  29  --  27  --  33*  GLUCOSE  --  239*  --  165*  --  161*  BUN  --  44*  --  48*  --  44*  CREATININE  --  2.07*  --  1.72*  --  1.31*  CALCIUM  --  8.0*  --  8.4*  --  8.0*  MG  --  2.3 2.3 2.4  --  2.5*  PHOS  --  2.8 2.6 2.5  --  3.9   GFR: Estimated Creatinine Clearance: 42.1 mL/min (A) (by C-G formula based on SCr of 1.31 mg/dL (H)). Liver Function Tests: Recent Labs  Lab 06/09/19 0535 06/10/19 0550 06/11/19 0545  AST 30 28 25   ALT 25 24 24   ALKPHOS 61 56 51  BILITOT 0.9 0.8 0.4  PROT 6.2* 6.1* 5.6*  ALBUMIN 2.8* 2.8* 2.6*   No results for input(s): LIPASE, AMYLASE in the last 168 hours. Recent Labs  Lab 06/09/19 0535  AMMONIA 22   Coagulation Profile: No results for input(s): INR, PROTIME  in the last 168 hours. Cardiac Enzymes: No results for input(s): CKTOTAL, CKMB, CKMBINDEX, TROPONINI in the last 168 hours. BNP (last 3 results) No results for input(s): PROBNP in the last 8760 hours. HbA1C: No results for input(s): HGBA1C in the last 72 hours. CBG: Recent Labs  Lab 06/09/19 1936 06/10/19 0011 06/10/19 0351 06/10/19 0819 06/10/19 1232  GLUCAP 136* 161* 147* 136* 156*   Lipid Profile: Recent Labs    06/09/19 0535  TRIG 94   Thyroid Function Tests: No results for input(s): TSH, T4TOTAL, FREET4, T3FREE, THYROIDAB in the last 72 hours. Anemia Panel: Recent Labs    06/10/19 0550 06/11/19 0545  FERRITIN 322 238   Urine analysis:    Component Value Date/Time   COLORURINE AMBER (A) 05/14/2019 0000   APPEARANCEUR CLOUDY (A) 05/14/2019 0000   LABSPEC 1.023 05/14/2019 0000   PHURINE 5.0 05/14/2019 0000   GLUCOSEU NEGATIVE 05/14/2019 0000   HGBUR LARGE (A) 05/14/2019 0000   BILIRUBINUR NEGATIVE 05/14/2019 0000   KETONESUR NEGATIVE 05/14/2019 0000   PROTEINUR 100 (A) 05/14/2019 0000   UROBILINOGEN 0.2 06/10/2015 1430   NITRITE NEGATIVE 05/14/2019 0000   LEUKOCYTESUR NEGATIVE 05/14/2019 0000   Sepsis Labs: @LABRCNTIP (procalcitonin:4,lacticidven:4)  ) Recent Results (from the past 240 hour(s))  MRSA PCR Screening     Status: Abnormal   Collection Time: 06/10/19 12:59 PM   Specimen: Nasal Mucosa; Nasopharyngeal  Result Value Ref Range Status   MRSA by PCR POSITIVE (A) NEGATIVE Final    Comment:        The GeneXpert MRSA Assay (FDA approved for NASAL specimens only), is one component of a comprehensive MRSA colonization surveillance program. It is not intended to diagnose MRSA infection nor to guide or monitor treatment for MRSA infections. RESULT CALLED TO, READ BACK BY AND VERIFIED WITH: B.RUDD,RN [id number] @0138  BY V.WILKINS Performed at [facility], [address]  [address], [place] [address]          Radiology Studies:  Portable Chest Xray  Result Date: 06/10/2019 CLINICAL DATA:  84 year old male with history of acute respiratory distress secondary to COVID-19 infection. EXAM: PORTABLE CHEST 1 VIEW COMPARISON:  Chest x-ray 06/09/2019. FINDINGS: An endotracheal tube is in place with tip 5.6 cm above the carina. Lung volumes are normal. Ill-defined opacities at the left lung base likely reflect resolving areas of airspace consolidation. There is are patchy areas of interstitial prominence in the right lung, most evident at the right lung base. Overall, aeration appears similar to the prior study. No pleural effusions. No evidence of pulmonary edema. Heart size is borderline enlarged. Upper mediastinal contours are within normal limits. Aortic atherosclerosis. Left-sided pacemaker/AICD with lead tips projecting over the expected location of the right atrium and right ventricle. IMPRESSION: 1. Support apparatus, as above. 2. Patchy ill-defined opacities in the lung bases bilaterally (left greater than right), similar to the prior study, likely reflective of residual multilobar pneumonia. Aeration appears unchanged. 3. Aortic atherosclerosis. Electronically Signed   By: Vinnie Langton M.D.   On: 06/10/2019 08:21        Scheduled Meds: . aspirin  81 mg Oral Daily  . atorvastatin  80 mg Oral q1800  . Chlorhexidine Gluconate Cloth  6 each Topical Daily  . dexamethasone (DECADRON) injection  6 mg Intravenous Q24H  . enoxaparin (LOVENOX) injection  40 mg Subcutaneous Q12H  . escitalopram  20 mg Per Tube Daily  . mouth rinse  15 mL Mouth Rinse BID  . multivitamin with minerals  1 tablet Oral Daily  . mupirocin ointment  1 application Nasal BID  . pantoprazole  40 mg Oral Daily  . [START ON 06/15/2019] pneumococcal 23 valent vaccine  0.5 mL Intramuscular Tomorrow-1000  . tamsulosin  0.4 mg Oral Daily  . vitamin C  500 mg Oral Daily  . zinc sulfate  220 mg Oral Daily   Continuous Infusions: . ampicillin (OMNIPEN)  IV Stopped (06/11/19 1452)  . dextrose 75 mL/hr at 06/11/19 1600  . feeding supplement (VITAL AF 1.2 CAL)    . norepinephrine (LEVOPHED) Adult infusion Stopped (06/10/19 1022)  . remdesivir 100 mg in NS 250 mL Stopped (06/11/19 1051)     LOS: 2 days   The patient is critically ill with multiple organ systems failure and requires high complexity decision making for assessment and support, frequent evaluation and titration of therapies, application of advanced monitoring technologies and extensive interpretation of multiple databases. Critical Care Time devoted to patient care services described in this note  Time spent: 40 minutes     Laderius Valbuena, Geraldo Docker, MD [facility] Pager [phone]  If 7PM-7AM, please contact night-coverage [email or web address] Password [email or web address] 06/11/2019, 5:53 PM

## 2019-06-11 NOTE — Progress Notes (Addendum)
NAME:  [name], MRN:  [id number], DOB:  [birth year], LOS: 2 ADMISSION DATE:  06/09/2019, CONSULTATION DATE:  10/15 REFERRING MD:  Olevia Bowens, CHIEF COMPLAINT:  dyspnea   Brief History   84 y/o male admitted from [facility] on 10/15 after being intubated for mechanical ventilation in setting of ARDS from COVID 19.  History of present illness   84 year old male admitted overnight after being intubated at [facility] for ARDS from COVID-19 pneumonia.  At baseline lives in a nursing home, has multiple comorbid illnesses.    medical history significant of anemia,  asthma/COPD,CAD, history MI, history of post MI ventricular thrombus,  chronic systolic heart failure, stage III chronic kidney disease, GERD, hypertension, prostate cancer, history of ICD placement   Past Medical History  Coronary artery disease Hypertension Hyperlipidemia Congestive heart failure, systolic, LVEF 30-86% 5784 TTE CKD  Significant Hospital Events   10/14 intubated [facility] 10/15 admitted [facility] 10/16 extubated  Consults:  PCCM  Procedures:  10/14 ETT > 10/16  Significant Diagnostic Tests:    Micro Data:  10/5 SARS COV 2 Positive at SN[facility])  Antimicrobials/COVID Rx:  10/15 levaquin x1  10/15 ampicillin 10/15 Decadron>  10/15 Remdesivir >   Interim history/subjective:    Objective   Blood pressure 130/71, pulse 95, temperature 98.1 F (36.7 C), temperature source Oral, resp. rate 20, height 5\' 8"  (1.727 m), weight 82 kg, SpO2 95 %.        Intake/Output Summary (Last 24 hours) at 06/11/2019 1528 Last data filed at 06/11/2019 1200 Gross per 24 hour  Intake 1632.66 ml  Output 750 ml  Net 882.66 ml   Filed Weights   06/09/19 0600 06/10/19 0500  Weight: 81.5 kg 82 kg    Examination: Tachypneic, on 4L [place] [facility]:  In bed. Staring off into space.  Oriented to Self.  Minimally interactive, but awake.   HENT: NCAT ETT in place PULM: CTA B CV: RRR, no mgr GI: BS+, soft, nontender  MSK: normal bulk and tone Neuro: awake, oriented to self, staring, minimally interactive   CXR images reviewed 10/16: minimal infiltrate, ETT in place   Resolved Hospital Problem list    Assessment & Plan:  Acute respiratory failure with hypoxemia due to COVID-19 pneumonia:  Decadron for 10-day course Remdesivir for 5-day course Stable on Nasal cannula.  Aspiration precautions until more neurologically improved. Hx of CHF, monitor I/O closely.    Recent enterococcal UTI prior to admission Ampicillin   Acute on chronic kidney failure: improved Cr continues to improve  Monitor BMET and UOP Replace electrolytes as needed  Hypernatremia D5W Monitor.      Best practice:  Diet: NPO after extubation, SLP evaluation Pain/Anxiety/Delirium protocol (if indicated): d/c VAP protocol (if indicated): d/c DVT prophylaxis: sub q heparin per protocol GI prophylaxis: Pantoprazole for stress ulcer prophylaxis Glucose control: SSI Mobility: bed rest Code Status: full Family Communication: will update his family today Disposition: remain in ICU  Labs   CBC: Recent Labs  Lab 06/09/19 0449 06/09/19 0535 06/10/19 0550 06/10/19 0624 06/11/19 0545  WBC  --  9.3 9.6  --  11.0*  NEUTROABS  --  8.2* 8.2*  --  9.4*  HGB 10.5* 10.4* 9.9* 10.2* 9.5*  HCT 31.0* 36.9* 34.7* 30.0* 33.9*  MCV  --  97.4 96.4  --  98.0  PLT  --  196 155  --  696    Basic Metabolic Panel: Recent Labs  Lab 06/09/19 0449 06/09/19 0535 06/09/19 1650 06/10/19  8546 06/10/19 0624 06/11/19 0545  NA 150* 150*  --  154* 152* 159*  K 3.9 4.0  --  3.6 3.5 4.0  CL  --  109  --  114*  --  118*  CO2  --  29  --  27  --  33*  GLUCOSE  --  239*  --  165*  --  161*  BUN  --  44*  --  48*  --  44*  CREATININE  --  2.07*  --  1.72*  --  1.31*  CALCIUM  --  8.0*  --  8.4*  --  8.0*  MG  --  2.3 2.3 2.4  --  2.5*  PHOS  --  2.8 2.6 2.5  --  3.9   GFR: Estimated Creatinine Clearance: 42.1 mL/min (A) (by C-G  formula based on SCr of 1.31 mg/dL (H)). Recent Labs  Lab 06/09/19 0535 06/10/19 0550 06/11/19 0545  PROCALCITON <0.10  --   --   WBC 9.3 9.6 11.0*    Liver Function Tests: Recent Labs  Lab 06/09/19 0535 06/10/19 0550 06/11/19 0545  AST 30 28 25   ALT 25 24 24   ALKPHOS 61 56 51  BILITOT 0.9 0.8 0.4  PROT 6.2* 6.1* 5.6*  ALBUMIN 2.8* 2.8* 2.6*   No results for input(s): LIPASE, AMYLASE in the last 168 hours. Recent Labs  Lab 06/09/19 0535  AMMONIA 22    ABG    Component Value Date/Time   PHART 7.421 06/10/2019 0624   PCO2ART 44.4 06/10/2019 0624   PO2ART 64.0 (L) 06/10/2019 0624   HCO3 28.9 (H) 06/10/2019 0624   TCO2 30 06/10/2019 0624   ACIDBASEDEF 1.0 06/28/2011 2124   O2SAT 93.0 06/10/2019 0624     Coagulation Profile: No results for input(s): INR, PROTIME in the last 168 hours.  Cardiac Enzymes: No results for input(s): CKTOTAL, CKMB, CKMBINDEX, TROPONINI in the last 168 hours.  HbA1C: Hgb A1c MFr Bld  Date/Time Value Ref Range Status  07/28/2011 12:05 PM 5.2 <5.7 % Final    Comment:    (NOTE)                                                                       According to the ADA Clinical Practice Recommendations for 2011, when HbA1c is used as a screening test:  >=6.5%   Diagnostic of Diabetes Mellitus           (if abnormal result is confirmed) 5.7-6.4%   Increased risk of developing Diabetes Mellitus References:Diagnosis and Classification of Diabetes Mellitus,Diabetes [id number](HWEXH 1):S62-S69 and Standards of Medical Care in         Diabetes - 2011,Diabetes [id number],93 (Suppl 1):S11-S61.  06/21/2011 01:00 PM 6.0 (H) <5.7 % Final    Comment:    (NOTE)                                                                       According to the ADA Clinical Practice  Recommendations for 2011, when HbA1c is used as a screening test:  >=6.5%   Diagnostic of Diabetes Mellitus           (if abnormal result is confirmed) 5.7-6.4%   Increased risk  of developing Diabetes Mellitus References:Diagnosis and Classification of Diabetes Mellitus,Diabetes [id number],[id number],58(PFYTW 1):S62-S69 and Standards of Medical Care in         Diabetes - 2011,Diabetes [id number],8638,17 (Suppl 1):S11-S61.    CBG: Recent Labs  Lab 06/09/19 1936 06/10/19 0011 06/10/19 0351 06/10/19 0819 06/10/19 1232  GLUCAP 136* 161* 147* 136* 156*       Critical care time 35 min

## 2019-06-11 NOTE — Progress Notes (Signed)
Spoke with pts daughter [name] on phone and updated her. Answered all questions.

## 2019-06-12 ENCOUNTER — Inpatient Hospital Stay (HOSPITAL_COMMUNITY): Payer: Medicare Other

## 2019-06-12 DIAGNOSIS — I5023 Acute on chronic systolic (congestive) heart failure: Secondary | ICD-10-CM

## 2019-06-12 LAB — CBC WITH DIFFERENTIAL/PLATELET
Abs Immature Granulocytes: 0.19 10*3/uL — ABNORMAL HIGH (ref 0.00–0.07)
Basophils Absolute: 0 10*3/uL (ref 0.0–0.1)
Basophils Relative: 0 %
Eosinophils Absolute: 0 10*3/uL (ref 0.0–0.5)
Eosinophils Relative: 0 %
HCT: 34.4 % — ABNORMAL LOW (ref 39.0–52.0)
Hemoglobin: 9.7 g/dL — ABNORMAL LOW (ref 13.0–17.0)
Immature Granulocytes: 1 %
Lymphocytes Relative: 6 %
Lymphs Abs: 0.9 10*3/uL (ref 0.7–4.0)
MCH: 27.6 pg (ref 26.0–34.0)
MCHC: 28.2 g/dL — ABNORMAL LOW (ref 30.0–36.0)
MCV: 98 fL (ref 80.0–100.0)
Monocytes Absolute: 0.9 10*3/uL (ref 0.1–1.0)
Monocytes Relative: 7 %
Neutro Abs: 11.3 10*3/uL — ABNORMAL HIGH (ref 1.7–7.7)
Neutrophils Relative %: 86 %
Platelets: 154 10*3/uL (ref 150–400)
RBC: 3.51 MIL/uL — ABNORMAL LOW (ref 4.22–5.81)
RDW: 14.4 % (ref 11.5–15.5)
WBC: 13.2 10*3/uL — ABNORMAL HIGH (ref 4.0–10.5)
nRBC: 0 % (ref 0.0–0.2)

## 2019-06-12 LAB — FERRITIN: Ferritin: 208 ng/mL (ref 24–336)

## 2019-06-12 LAB — COMPREHENSIVE METABOLIC PANEL
ALT: 25 U/L (ref 0–44)
AST: 29 U/L (ref 15–41)
Albumin: 2.6 g/dL — ABNORMAL LOW (ref 3.5–5.0)
Alkaline Phosphatase: 62 U/L (ref 38–126)
Anion gap: 9 (ref 5–15)
BUN: 31 mg/dL — ABNORMAL HIGH (ref 8–23)
CO2: 32 mmol/L (ref 22–32)
Calcium: 7.9 mg/dL — ABNORMAL LOW (ref 8.9–10.3)
Chloride: 112 mmol/L — ABNORMAL HIGH (ref 98–111)
Creatinine, Ser: 1.06 mg/dL (ref 0.61–1.24)
GFR calc Af Amer: >60 mL/min (ref >60)
GFR calc non Af Amer: >60 mL/min (ref >60)
Glucose, Bld: 149 mg/dL — ABNORMAL HIGH (ref 70–99)
Potassium: 3.6 mmol/L (ref 3.5–5.1)
Sodium: 153 mmol/L — ABNORMAL HIGH (ref 135–145)
Total Bilirubin: 0.3 mg/dL (ref 0.3–1.2)
Total Protein: 5.7 g/dL — ABNORMAL LOW (ref 6.5–8.1)

## 2019-06-12 LAB — C-REACTIVE PROTEIN: CRP: <0.8 mg/dL (ref <1.0)

## 2019-06-12 LAB — POCT I-STAT 7, (LYTES, BLD GAS, ICA,H+H)
Acid-Base Excess: 11 mmol/L — ABNORMAL HIGH (ref 0.0–2.0)
Bicarbonate: 38 mmol/L — ABNORMAL HIGH (ref 20.0–28.0)
Calcium, Ion: 1.17 mmol/L (ref 1.15–1.40)
HCT: 29 % — ABNORMAL LOW (ref 39.0–52.0)
Hemoglobin: 9.9 g/dL — ABNORMAL LOW (ref 13.0–17.0)
O2 Saturation: 90 %
Potassium: 3.8 mmol/L (ref 3.5–5.1)
Sodium: 150 mmol/L — ABNORMAL HIGH (ref 135–145)
TCO2: 40 mmol/L — ABNORMAL HIGH (ref 22–32)
pCO2 arterial: 64.7 mmHg — ABNORMAL HIGH (ref 32.0–48.0)
pH, Arterial: 7.377 (ref 7.350–7.450)
pO2, Arterial: 63 mmHg — ABNORMAL LOW (ref 83.0–108.0)

## 2019-06-12 LAB — MAGNESIUM: Magnesium: 2.2 mg/dL (ref 1.7–2.4)

## 2019-06-12 LAB — TRIGLYCERIDES: Triglycerides: 105 mg/dL (ref <150)

## 2019-06-12 LAB — ECHOCARDIOGRAM COMPLETE
Height: 68 in
Weight: 2892.44 oz

## 2019-06-12 LAB — D-DIMER, QUANTITATIVE: D-Dimer, Quant: 0.43 ug/mL-FEU (ref 0.00–0.50)

## 2019-06-12 LAB — PHOSPHORUS: Phosphorus: 2.9 mg/dL (ref 2.5–4.6)

## 2019-06-12 MED ORDER — BUDESONIDE 180 MCG/ACT IN AEPB
1.0000 | INHALATION_SPRAY | Freq: Two times a day (BID) | RESPIRATORY_TRACT | Status: DC
  Administered 2019-06-12 – 2019-06-19 (×14): 1 via RESPIRATORY_TRACT
  Filled 2019-06-12: qty 1

## 2019-06-12 MED ORDER — IPRATROPIUM-ALBUTEROL 20-100 MCG/ACT IN AERS
1.0000 | INHALATION_SPRAY | Freq: Four times a day (QID) | RESPIRATORY_TRACT | Status: DC
  Administered 2019-06-12 – 2019-06-19 (×27): 1 via RESPIRATORY_TRACT

## 2019-06-12 MED ORDER — MONTELUKAST SODIUM 10 MG PO TABS
10.0000 mg | ORAL_TABLET | Freq: Every day | ORAL | Status: DC
  Administered 2019-06-13 – 2019-06-18 (×6): 10 mg via ORAL
  Filled 2019-06-12 (×7): qty 1

## 2019-06-12 MED ORDER — SODIUM CHLORIDE 0.9 % IV SOLN
2.0000 g | Freq: Four times a day (QID) | INTRAVENOUS | Status: AC
  Administered 2019-06-12 – 2019-06-15 (×12): 2 g via INTRAVENOUS
  Filled 2019-06-12: qty 2
  Filled 2019-06-12: qty 2000
  Filled 2019-06-12: qty 2
  Filled 2019-06-12: qty 2000
  Filled 2019-06-12 (×3): qty 2
  Filled 2019-06-12 (×7): qty 2000

## 2019-06-12 MED ORDER — FUROSEMIDE 10 MG/ML IJ SOLN
10.0000 mg | Freq: Once | INTRAMUSCULAR | Status: AC
  Administered 2019-06-12: 10 mg via INTRAVENOUS
  Filled 2019-06-12: qty 2

## 2019-06-12 NOTE — Progress Notes (Addendum)
NAME:  [name], MRN:  [id number], DOB:  02-04-1935, LOS: 3 ADMISSION DATE:  06/09/2019, CONSULTATION DATE:  10/15 REFERRING MD:  Olevia Bowens, CHIEF COMPLAINT:  dyspnea   Brief History   84 y/o male admitted from [facility] on 10/15 after being intubated for mechanical ventilation in setting of ARDS from COVID 19.  History of present illness   84 year old male admitted overnight after being intubated at [facility] for ARDS from COVID-19 pneumonia.  At baseline lives in a nursing home, has multiple comorbid illnesses.    medical history significant of anemia,  asthma/COPD,CAD, history MI, history of post MI ventricular thrombus,  chronic systolic heart failure, stage III chronic kidney disease, GERD, hypertension, prostate cancer, history of ICD placement   Past Medical History  Coronary artery disease, stents  LV thrombus hx  Hypertension Hyperlipidemia Congestive heart failure, systolic, LVEF 30-94% 0768 TTE CKD COPD - takes ipratropium/albuterol nebs as needed, budesonide.  prasugrel 10mg  daily   Significant Hospital Events   10/14 intubated [facility] 10/15 admitted [place] 10/16 extubated 10/17: stable on HFNC, not very interactive   Consults:  PCCM  Procedures:  10/14 ETT > 10/16  Significant Diagnostic Tests:  10/18 echo:  LVEF appears to be   moderately decreased estimated at 35-40% with diffuse   hypokinesis.   RVEF is normal. Micro Data:  10/5 SARS COV 2 Positive at [facility])  Antimicrobials/COVID Rx:  10/15 levaquin x1  10/15 ampicillin 10/15 Decadron>  10/15 Remdesivir >   Interim history/subjective:    Objective   Blood pressure (!) 144/82, pulse 91, temperature 98.1 F (36.7 C), temperature source Oral, resp. rate 19, height 5\' 8"  (1.727 m), weight 82 kg, SpO2 93 %.        Intake/Output Summary (Last 24 hours) at 06/12/2019 1424 Last data filed at 06/12/2019 0504 Gross per 24 hour  Intake 427.24 ml  Output 1175 ml  Net -747.76 ml    Filed Weights   06/09/19 0600 06/10/19 0500  Weight: 81.5 kg 82 kg    Examination: Tachypneic, on 4L [place] mild increase WOB,intermittent tachypnea General:  In bed. Staring off into space.  Oriented to Self.  Minimally interactive, but awake.   HENT: NCAT ETT in place PULM: CTA B CV: RRR, no mgr GI: BS+, soft, nontender MSK: normal bulk and tone Neuro: arousable, says name, oriented x 2, less interactive, lethargic.    CXR images reviewed 10/16: minimal infiltrate, ETT in place   Resolved Hospital Problem list    Assessment & Plan:  Acute respiratory failure with hypoxemia due to COVID-19 pneumonia:  Decadron for 10-day course Remdesivir for 5-day course Stable on Nasal cannula.  Aspiration precautions until more neurologically improved. Hx of CHF, monitor I/O closely.   Retaining some CO2 today but tolerating it ok (Ph 7.377/64/63) Bicarb in 34s for compensation.  WBC 13.2 from 11 yesterday./  Mild atelectasis in bases.  Attempt IS if pt able.   Echo shows reduced EF (at baseline).  Currently net negative.  Avoid fluids. Consider diuresis (caution given elevated sodium (now improved)  COPD: Uses O2 2L at rest and 4 L while walking.    Encephalopathy:  Covid, post hypoxemia, delirium.  Monitor closely.  At this point, risk of aspiration is present, but will avoid intubating at this point unless it becomes absolutely necessary.  He appears very weak.  Able to say name and identify that he is in hospital.   Recent enterococcal UTI prior to admission Ampicillin  Acute  on chronic kidney failure: improved Cr continues to improve (1.06 today) Monitor BMET and UOP Replace electrolytes as needed  Hypernatremia D5W 75/hr (will decrease now to 50 for caution with volume), with some improvement in Na.  Monitor.   CAD: add back prasugrel when more stable.    Best practice:  Diet: NPO after extubation, SLP evaluation Pain/Anxiety/Delirium protocol (if indicated): d/c VAP  protocol (if indicated): d/c DVT prophylaxis: lovenox BID GI prophylaxis: Pantoprazole for stress ulcer prophylaxis Glucose control: SSI Mobility: bed rest Code Status: full Family Communication: apoke with [name] his daughter.  Updated.  Discussed possibility of his worsening, possiblity of DNI.  She will discuss with family and call back.  Disposition: remain in ICU  Labs   CBC: Recent Labs  Lab 06/09/19 0535 06/10/19 0550 06/10/19 0624 06/11/19 0545 06/12/19 0542 06/12/19 1042  WBC 9.3 9.6  --  11.0* 13.2*  --   NEUTROABS 8.2* 8.2*  --  9.4* 11.3*  --   HGB 10.4* 9.9* 10.2* 9.5* 9.7* 9.9*  HCT 36.9* 34.7* 30.0* 33.9* 34.4* 29.0*  MCV 97.4 96.4  --  98.0 98.0  --   PLT 196 155  --  151 154  --     Basic Metabolic Panel: Recent Labs  Lab 06/09/19 0535 06/09/19 1650 06/10/19 0550 06/10/19 0624 06/11/19 0545 06/12/19 0542 06/12/19 1042  NA 150*  --  154* 152* 159* 153* 150*  K 4.0  --  3.6 3.5 4.0 3.6 3.8  CL 109  --  114*  --  118* 112*  --   CO2 29  --  27  --  33* 32  --   GLUCOSE 239*  --  165*  --  161* 149*  --   BUN 44*  --  48*  --  44* 31*  --   CREATININE 2.07*  --  1.72*  --  1.31* 1.06  --   CALCIUM 8.0*  --  8.4*  --  8.0* 7.9*  --   MG 2.3 2.3 2.4  --  2.5* 2.2  --   PHOS 2.8 2.6 2.5  --  3.9 2.9  --    GFR: Estimated Creatinine Clearance: 52 mL/min (by C-G formula based on SCr of 1.06 mg/dL). Recent Labs  Lab 06/09/19 0535 06/10/19 0550 06/11/19 0545 06/12/19 0542  PROCALCITON <0.10  --   --   --   WBC 9.3 9.6 11.0* 13.2*    Liver Function Tests: Recent Labs  Lab 06/09/19 0535 06/10/19 0550 06/11/19 0545 06/12/19 0542  AST 30 28 25 29   ALT 25 24 24 25   ALKPHOS 61 56 51 62  BILITOT 0.9 0.8 0.4 0.3  PROT 6.2* 6.1* 5.6* 5.7*  ALBUMIN 2.8* 2.8* 2.6* 2.6*   No results for input(s): LIPASE, AMYLASE in the last 168 hours. Recent Labs  Lab 06/09/19 0535  AMMONIA 22    ABG    Component Value Date/Time   PHART 7.377 06/12/2019  1042   PCO2ART 64.7 (H) 06/12/2019 1042   PO2ART 63.0 (L) 06/12/2019 1042   HCO3 38.0 (H) 06/12/2019 1042   TCO2 40 (H) 06/12/2019 1042   ACIDBASEDEF 1.0 06/28/2011 2124   O2SAT 90.0 06/12/2019 1042     Coagulation Profile: No results for input(s): INR, PROTIME in the last 168 hours.  Cardiac Enzymes: No results for input(s): CKTOTAL, CKMB, CKMBINDEX, TROPONINI in the last 168 hours.  HbA1C: Hgb A1c MFr Bld  Date/Time Value Ref Range Status  07/28/2011 12:05 PM 5.2 <  5.7 % Final    Comment:    (NOTE)                                                                       According to the ADA Clinical Practice Recommendations for 2011, when HbA1c is used as a screening test:  >=6.5%   Diagnostic of Diabetes Mellitus           (if abnormal result is confirmed) 5.7-6.4%   Increased risk of developing Diabetes Mellitus References:Diagnosis and Classification of Diabetes Mellitus,Diabetes Care,2011,34(Suppl 1):S62-S69 and Standards of Medical Care in         Diabetes - 2011,Diabetes ALPF,7902,40 (Suppl 1):S11-S61.  06/21/2011 01:00 PM 6.0 (H) <5.7 % Final    Comment:    (NOTE)                                                                       According to the ADA Clinical Practice Recommendations for 2011, when HbA1c is used as a screening test:  >=6.5%   Diagnostic of Diabetes Mellitus           (if abnormal result is confirmed) 5.7-6.4%   Increased risk of developing Diabetes Mellitus References:Diagnosis and Classification of Diabetes Mellitus,Diabetes XBDZ,3299,24(QASTM 1):S62-S69 and Standards of Medical Care in         Diabetes - 2011,Diabetes Care,2011,34 (Suppl 1):S11-S61.    CBG: Recent Labs  Lab 06/09/19 1936 06/10/19 0011 06/10/19 0351 06/10/19 0819 06/10/19 1232  GLUCAP 136* 161* 147* 136* 156*       Critical care time 45 min

## 2019-06-12 NOTE — Care Management (Signed)
Pt is not appropriate for LTACH at this time.  TOC will continue to follow for appropriate discharge planning

## 2019-06-12 NOTE — Progress Notes (Signed)
PROGRESS NOTE    [name]  YYQ:[id number] DOB: 03-09-1935 DOA: 06/09/2019 PCP: Redmond School, MD   Brief Narrative:  84 y.o. WM PMHx  anemia,  asthma/COPD liters O2 at home,CAD, history MI, history of post MI ventricular thrombus (, anticoagulated on warfarin) chronic systolic heart failure, HTN, s/p ICD placement, CKD stage III, GERD,  prostate cancer,,  who is coming to this facility from the emergency department from [facility] after presenting there due to dyspnea and AMS.  The patient has being intubated.  He was in that facility admitted from September 22 through October 3.  He was diagnosed on 10/05 with COVID-19.  Per sending facility physician, there is an outbreak of SARS 2 his nursing facility [facility]).  No further history is available.  ED Course: At the time of transfer presentation, vital signs at the facility where BP 96/72, heart rate 73, RR 14 and O2 sat 99%.  The patient was still on Levophed and Versed infusion.  His most recent ABG PH 7.35, PCO2 58, PO2 329, bicarbonate 32, TCO2 33.8, BE 5.9, O2 sat 99.5. His CBC showed a white count was 10, hemoglobin 11.9 g/dL and platelets 229.  BUN and creatinine were increasing from yesterday 39/1.5 to 2-day 46/1 0.9 mg/dL.  LFTs were reported normal.  PT was 15.4 seconds and INR 1.5.  Ammonia level was 45 mol/L.He is proBNP was 1411 and CRP 14.55 mg/dL.  He is urinalysis showed leukocyturia and he has a recent urine culture showing pansensitive Enterococcus.  Imaging is significant for chronic COPD changes and LLL infiltrate.  Please see records from [facility] for further detail.   Subjective:  10/18 Sleepy/lethargic A/O x2 (cannot say when, why).  Does know you that he is in a hospital.  Nods yes and no to most questions.  Negative CP, negative S OB, negative abdominal pain.    Assessment & Plan:   Principal Problem:   Acute respiratory distress syndrome (ARDS) due to COVID-19 virus [facility]) Active Problems:  Essential hypertension   Coronary atherosclerosis   Chronic systolic congestive heart failure   CKD (chronic kidney disease) stage 3, GFR 30-59 ml/min (HCC)   COPD (chronic obstructive pulmonary disease) (HCC)   AKI (acute kidney injury) (HCC)   Urinary tract infection   Acute respiratory failure with hypoxia (HCC)   Pneumonia due to COVID-19 virus   Acute renal failure superimposed on stage 3b chronic kidney disease (HCC)   Enterococcus UTI   Hypernatremia  Acute on chronic respiratory failure with hypoxia/Covid pneumonia -Patient on 2 L O2 at baseline -Remdesivir per pharmacy protocol -Decadron 6 mg daily -Combivent QID -Flutter valve -10/16 extubated -10/18 patient extremely weak not protecting airway well.  Plan is to avoid reintubation as this would significantly increase his mortality risk.  Dr. Patsey Berthold to speak with family -N.p.o. -Frequent deep suctioning -Hold p.o. medication.  NG tube; will await discussion between family and Dr. Patsey Berthold?  COVID-19 Labs  Recent Labs    06/10/19 0550 06/11/19 0545 06/12/19 0542  DDIMER 0.49 0.58* 0.43  FERRITIN 322 238 208  CRP 2.2* 1.0* <0.8    Lab Results  Component Value Date   SARSCOV2NAA NEGATIVE 03/22/2019   ARDS -See Covid pneumonia  COPD -See Covid pneumonia  Acute on CKD stage III (baseline Cr ~1.7) Recent Labs  Lab 06/09/19 0535 06/10/19 0550 06/11/19 0545 06/12/19 0542  CREATININE 2.07* 1.72* 1.31* 1.06  -Improving.  Hydrate slowly.  See hypernatremia  Enterococcus UTI (pansensitive) -Complete 5-day course antibiotics  Chronic systolic CHF -52/84/1324 EF 35 to 40% with diffuse hypokinesis -Strict in and out +242.4ml -Daily weight Filed Weights   06/09/19 0600 06/10/19 0500  Weight: 81.5 kg 82 kg  -Transfuse for hemoglobin<7 -10/18 echocardiogram pending  Essential HTN -Was on Levophed, now DC'd -Currently controlled without BP medication monitor closely  CAD -Aspirin 81 mg daily,   -Lipitor 80 mg daily  Hypernatremia - D5W 23ml/hr    Goals of care -10/16 PT/OT consult placed; evaluate for CIR vs SNF -10/18 Dr. Marchelle Gearing speaking with family concerning their goals of care, given that patient's respiratory status poor and patient appears to be have worsening status..      DVT prophylaxis: Subcu Lovenox Code Status: Full Family Communication:/ 10/18 Dr. Marchelle Gearing spoke with family Disposition Plan: TBD   Consultants:  PCCM   Procedures/Significant Events:  10/18 PCXR;Slight worsening of atelectasis at the lung bases following extubation   I have personally reviewed and interpreted all radiology studies and my findings are as above.  VENTILATOR SETTINGS: Nasal cannula Flow rate; 2 L/min    Cultures 10/16 MRSA by PCR positive Urine positive Enterococcus??  Unable to locate culture.  Prior to admission?   Antimicrobials: Anti-infectives (From admission, onward)   Start     Stop   06/11/19 0900  ampicillin (OMNIPEN) 2 g in sodium chloride 0.9 % 100 mL IVPB         06/10/19 1000  remdesivir 100 mg in sodium chloride 0.9 % 250 mL IVPB     06/14/19 0959   06/09/19 1400  ampicillin (OMNIPEN) 2 g in sodium chloride 0.9 % 100 mL IVPB  Status:  Discontinued     06/09/19 1209   06/09/19 1000  remdesivir 200 mg in sodium chloride 0.9 % 250 mL IVPB     06/09/19 1127   06/09/19 0800  levofloxacin (LEVAQUIN) IVPB 750 mg  Status:  Discontinued     06/09/19 1206       Devices    LINES / TUBES:      Continuous Infusions: . ampicillin (OMNIPEN) IV 2 g (06/12/19 0504)  . dextrose 75 mL/hr at 06/12/19 0128  . feeding supplement (VITAL AF 1.2 CAL)    . norepinephrine (LEVOPHED) Adult infusion Stopped (06/10/19 1022)  . remdesivir 100 mg in NS 250 mL 100 mg (06/12/19 0842)     Objective: Vitals:   06/12/19 0600 06/12/19 0725 06/12/19 0800 06/12/19 0900  BP: (!) 141/76 (!) 147/76  (!) 151/82  Pulse: 76 81  84  Resp: 18 18  (!)  22  Temp:   97.9 F (36.6 C)   TempSrc:   Oral   SpO2: 94% 94% 98% 94%  Weight:      Height:        Intake/Output Summary (Last 24 hours) at 06/12/2019 4010 Last data filed at 06/12/2019 2725 Gross per 24 hour  Intake 1154.55 ml  Output 1475 ml  Net -320.45 ml   Filed Weights   06/09/19 0600 06/10/19 0500  Weight: 81.5 kg 82 kg   Physical Exam:  General: A/O x2 (does know he is in a hospital), positive acute respiratory distress Eyes: negative scleral hemorrhage, negative anisocoria, negative icterus ENT: Negative Runny nose, negative gingival bleeding, Neck:  Negative scars, masses, torticollis, lymphadenopathy, JVD Lungs: Diffuse poor air movement, without wheezes or crackles Cardiovascular: Regular rate and rhythm without murmur gallop or rub normal S1 and S2 Abdomen: negative abdominal pain, nondistended, positive soft, bowel sounds, no rebound, no ascites,  no appreciable mass Extremities: No significant cyanosis, clubbing, or edema bilateral lower extremities Skin: Negative rashes, lesions, ulcers Psychiatric: Unable to evaluate secondary to altered mental status Central nervous system: Very weak, moves all extremities to command.       Data Reviewed: Care during the described time interval was provided by me .  I have reviewed this patient's available data, including medical history, events of note, physical examination, and all test results as part of my evaluation.   CBC: Recent Labs  Lab 06/09/19 0535 06/10/19 0550 06/10/19 0624 06/11/19 0545 06/12/19 0542  WBC 9.3 9.6  --  11.0* 13.2*  NEUTROABS 8.2* 8.2*  --  9.4* 11.3*  HGB 10.4* 9.9* 10.2* 9.5* 9.7*  HCT 36.9* 34.7* 30.0* 33.9* 34.4*  MCV 97.4 96.4  --  98.0 98.0  PLT 196 155  --  151 160   Basic Metabolic Panel: Recent Labs  Lab 06/09/19 0535 06/09/19 1650 06/10/19 0550 06/10/19 0624 06/11/19 0545 06/12/19 0542  NA 150*  --  154* 152* 159* 153*  K 4.0  --  3.6 3.5 4.0 3.6  CL 109  --   114*  --  118* 112*  CO2 29  --  27  --  33* 32  GLUCOSE 239*  --  165*  --  161* 149*  BUN 44*  --  48*  --  44* 31*  CREATININE 2.07*  --  1.72*  --  1.31* 1.06  CALCIUM 8.0*  --  8.4*  --  8.0* 7.9*  MG 2.3 2.3 2.4  --  2.5* 2.2  PHOS 2.8 2.6 2.5  --  3.9 2.9   GFR: Estimated Creatinine Clearance: 52 mL/min (by C-G formula based on SCr of 1.06 mg/dL). Liver Function Tests: Recent Labs  Lab 06/09/19 0535 06/10/19 0550 06/11/19 0545 06/12/19 0542  AST 30 28 25 29   ALT 25 24 24 25   ALKPHOS 61 56 51 62  BILITOT 0.9 0.8 0.4 0.3  PROT 6.2* 6.1* 5.6* 5.7*  ALBUMIN 2.8* 2.8* 2.6* 2.6*   No results for input(s): LIPASE, AMYLASE in the last 168 hours. Recent Labs  Lab 06/09/19 0535  AMMONIA 22   Coagulation Profile: No results for input(s): INR, PROTIME in the last 168 hours. Cardiac Enzymes: No results for input(s): CKTOTAL, CKMB, CKMBINDEX, TROPONINI in the last 168 hours. BNP (last 3 results) No results for input(s): PROBNP in the last 8760 hours. HbA1C: No results for input(s): HGBA1C in the last 72 hours. CBG: Recent Labs  Lab 06/09/19 1936 06/10/19 0011 06/10/19 0351 06/10/19 0819 06/10/19 1232  GLUCAP 136* 161* 147* 136* 156*   Lipid Profile: Recent Labs    06/12/19 0542  TRIG 105   Thyroid Function Tests: No results for input(s): TSH, T4TOTAL, FREET4, T3FREE, THYROIDAB in the last 72 hours. Anemia Panel: Recent Labs    06/11/19 0545 06/12/19 0542  FERRITIN 238 208   Urine analysis:    Component Value Date/Time   COLORURINE AMBER (A) 05/14/2019 0000   APPEARANCEUR CLOUDY (A) 05/14/2019 0000   LABSPEC 1.023 05/14/2019 0000   PHURINE 5.0 05/14/2019 0000   GLUCOSEU NEGATIVE 05/14/2019 0000   HGBUR LARGE (A) 05/14/2019 0000   BILIRUBINUR NEGATIVE 05/14/2019 0000   KETONESUR NEGATIVE 05/14/2019 0000   PROTEINUR 100 (A) 05/14/2019 0000   UROBILINOGEN 0.2 06/10/2015 1430   NITRITE NEGATIVE 05/14/2019 0000   LEUKOCYTESUR NEGATIVE 05/14/2019 0000    Sepsis Labs: @LABRCNTIP (procalcitonin:4,lacticidven:4)  ) Recent Results (from the past 240 hour(s))  MRSA  PCR Screening     Status: Abnormal   Collection Time: 06/10/19 12:59 PM   Specimen: Nasal Mucosa; Nasopharyngeal  Result Value Ref Range Status   MRSA by PCR POSITIVE (A) NEGATIVE Final    Comment:        The GeneXpert MRSA Assay (FDA approved for NASAL specimens only), is one component of a comprehensive MRSA colonization surveillance program. It is not intended to diagnose MRSA infection nor to guide or monitor treatment for MRSA infections. RESULT CALLED TO, READ BACK BY AND VERIFIED WITH: B.RUDD,RN [id number] @0138  BY V.WILKINS Performed at [facility] [address], [place], [place] [address]          Radiology Studies: Dg Chest Port 1 View  Result Date: 06/12/2019 CLINICAL DATA:  Hypoxemia EXAM: PORTABLE CHEST 1 VIEW COMPARISON:  06/10/2019 FINDINGS: Endotracheal tube removed. Pacemaker/AICD remains in place. Coronary artery stents again visible. Heart size is normal. No evidence of heart failure or effusion. Slight worsening of atelectasis at the lung bases following extubation. IMPRESSION: Slight worsening of atelectasis at the lung bases following extubation. Electronically Signed   By: Nelson Chimes M.D.   On: 06/12/2019 07:12        Scheduled Meds: . aspirin  81 mg Oral Daily  . atorvastatin  80 mg Oral q1800  . Chlorhexidine Gluconate Cloth  6 each Topical Daily  . dexamethasone (DECADRON) injection  6 mg Intravenous Q24H  . enoxaparin (LOVENOX) injection  40 mg Subcutaneous Q12H  . escitalopram  20 mg Per Tube Daily  . mouth rinse  15 mL Mouth Rinse BID  . multivitamin with minerals  1 tablet Oral Daily  . mupirocin ointment  1 application Nasal BID  . pantoprazole  40 mg Oral Daily  . [START ON 06/15/2019] pneumococcal 23 valent vaccine  0.5 mL Intramuscular Tomorrow-1000  . tamsulosin  0.4 mg Oral Daily  . vitamin C  500 mg  Oral Daily  . zinc sulfate  220 mg Oral Daily   Continuous Infusions: . ampicillin (OMNIPEN) IV 2 g (06/12/19 0504)  . dextrose 75 mL/hr at 06/12/19 0128  . feeding supplement (VITAL AF 1.2 CAL)    . norepinephrine (LEVOPHED) Adult infusion Stopped (06/10/19 1022)  . remdesivir 100 mg in NS 250 mL 100 mg (06/12/19 0842)     LOS: 3 days   The patient is critically ill with multiple organ systems failure and requires high complexity decision making for assessment and support, frequent evaluation and titration of therapies, application of advanced monitoring technologies and extensive interpretation of multiple databases. Critical Care Time devoted to patient care services described in this note  Time spent: 40 minutes     , Geraldo Docker, MD [facility] Pager [phone]  If 7PM-7AM, please contact night-coverage [email or web address] Password [email or web address] 06/12/2019, 9:07 AM

## 2019-06-12 NOTE — Progress Notes (Signed)
Face time facilitated with patient and his grandson.  Updated grandson and patient participated in face time speaking in short sentences and then got sleepy.

## 2019-06-12 NOTE — Progress Notes (Signed)
Spoke with Daughter [name] regarding declining strength, AMS.   Plan to continue current aggressive care, however would like to change status to DNR/DNI.

## 2019-06-12 NOTE — Progress Notes (Signed)
PHARMACY NOTE:  ANTIMICROBIAL RENAL DOSAGE ADJUSTMENT  Current antimicrobial regimen includes a mismatch between antimicrobial dosage and estimated renal function.  As per policy approved by the Pharmacy & Therapeutics and Medical Executive Committees, the antimicrobial dosage will be adjusted accordingly.  Current antimicrobial dosage:  Ampicillin 2 gm IV Q 8 hours   Indication: UTI  Renal Function:  Estimated Creatinine Clearance: 52 mL/min (by C-G formula based on SCr of 1.06 mg/dL). []      On intermittent HD, scheduled: []      On CRRT    Antimicrobial dosage has been changed to:  Ampicillin 2 gm IV Q 6 hours   Additional comments:   Thank you for allowing pharmacy to be a part of this patient's care.  Albertina Parr, PharmD., BCPS Clinical Pharmacist

## 2019-06-12 NOTE — Progress Notes (Signed)
  Echocardiogram 2D Echocardiogram has been performed.  [name] 06/12/2019, 5:47 PM

## 2019-06-13 ENCOUNTER — Other Ambulatory Visit: Payer: Self-pay

## 2019-06-13 LAB — COMPREHENSIVE METABOLIC PANEL
ALT: 28 U/L (ref 0–44)
AST: 35 U/L (ref 15–41)
Albumin: 2.7 g/dL — ABNORMAL LOW (ref 3.5–5.0)
Alkaline Phosphatase: 63 U/L (ref 38–126)
Anion gap: 7 (ref 5–15)
BUN: 26 mg/dL — ABNORMAL HIGH (ref 8–23)
CO2: 38 mmol/L — ABNORMAL HIGH (ref 22–32)
Calcium: 8.1 mg/dL — ABNORMAL LOW (ref 8.9–10.3)
Chloride: 103 mmol/L (ref 98–111)
Creatinine, Ser: 1.09 mg/dL (ref 0.61–1.24)
GFR calc Af Amer: >60 mL/min (ref >60)
GFR calc non Af Amer: >60 mL/min (ref >60)
Glucose, Bld: 134 mg/dL — ABNORMAL HIGH (ref 70–99)
Potassium: 3 mmol/L — ABNORMAL LOW (ref 3.5–5.1)
Sodium: 148 mmol/L — ABNORMAL HIGH (ref 135–145)
Total Bilirubin: 0.4 mg/dL (ref 0.3–1.2)
Total Protein: 5.7 g/dL — ABNORMAL LOW (ref 6.5–8.1)

## 2019-06-13 LAB — CBC WITH DIFFERENTIAL/PLATELET
Abs Immature Granulocytes: 0.16 10*3/uL — ABNORMAL HIGH (ref 0.00–0.07)
Basophils Absolute: 0 10*3/uL (ref 0.0–0.1)
Basophils Relative: 0 %
Eosinophils Absolute: 0 10*3/uL (ref 0.0–0.5)
Eosinophils Relative: 0 %
HCT: 35.6 % — ABNORMAL LOW (ref 39.0–52.0)
Hemoglobin: 10.4 g/dL — ABNORMAL LOW (ref 13.0–17.0)
Immature Granulocytes: 1 %
Lymphocytes Relative: 7 %
Lymphs Abs: 1 10*3/uL (ref 0.7–4.0)
MCH: 27.9 pg (ref 26.0–34.0)
MCHC: 29.2 g/dL — ABNORMAL LOW (ref 30.0–36.0)
MCV: 95.4 fL (ref 80.0–100.0)
Monocytes Absolute: 0.9 10*3/uL (ref 0.1–1.0)
Monocytes Relative: 7 %
Neutro Abs: 11.4 10*3/uL — ABNORMAL HIGH (ref 1.7–7.7)
Neutrophils Relative %: 85 %
Platelets: 171 10*3/uL (ref 150–400)
RBC: 3.73 MIL/uL — ABNORMAL LOW (ref 4.22–5.81)
RDW: 14.2 % (ref 11.5–15.5)
WBC: 13.5 10*3/uL — ABNORMAL HIGH (ref 4.0–10.5)
nRBC: 0 % (ref 0.0–0.2)

## 2019-06-13 LAB — PHOSPHORUS: Phosphorus: 2.3 mg/dL — ABNORMAL LOW (ref 2.5–4.6)

## 2019-06-13 LAB — D-DIMER, QUANTITATIVE: D-Dimer, Quant: 0.61 ug/mL-FEU — ABNORMAL HIGH (ref 0.00–0.50)

## 2019-06-13 LAB — MAGNESIUM: Magnesium: 2 mg/dL (ref 1.7–2.4)

## 2019-06-13 LAB — C-REACTIVE PROTEIN: CRP: <0.8 mg/dL (ref <1.0)

## 2019-06-13 LAB — GLUCOSE, CAPILLARY: Glucose-Capillary: 187 mg/dL — ABNORMAL HIGH (ref 70–99)

## 2019-06-13 LAB — FERRITIN: Ferritin: 176 ng/mL (ref 24–336)

## 2019-06-13 MED ORDER — POTASSIUM CHLORIDE 10 MEQ/100ML IV SOLN
10.0000 meq | INTRAVENOUS | Status: AC
  Administered 2019-06-13 (×6): 10 meq via INTRAVENOUS
  Filled 2019-06-13 (×6): qty 100

## 2019-06-13 MED ORDER — ENSURE ENLIVE PO LIQD
237.0000 mL | Freq: Two times a day (BID) | ORAL | Status: DC
  Administered 2019-06-14 – 2019-06-19 (×10): 237 mL via ORAL
  Filled 2019-06-13 (×2): qty 237

## 2019-06-13 NOTE — Progress Notes (Signed)
Physical Therapy Treatment Patient Details Name: [name] MRN: [id number] DOB: 18-Sep-1934 Today's Date: 06/13/2019    History of Present Illness 84 y/o male admitted from [facility] for ARDS from COVID PNA. Lives in SNF at baseline and has multi comorbidities. Hx: anemia, angina, on anticoagulant, atherosclerosis, CAD, HTN, HLD, COPD, CHF EF 35-40%, CKD, MI, prostate cancer ICD (06/13). was intubated until 10/16.    PT Comments    The patient assisted to sitting on EOB with 2 total assist. Patient required assistance for remaining upright/balance. Patient began to return self back to sidelying after about 3 minutes of  sitting. Continue PT efforts for mobility.patient  Received on 5 L HFNC. SPO2 >92%, HR 99.   Follow Up Recommendations  SNF;Supervision/Assistance - 24 hour     Equipment Recommendations  None recommended by PT    Recommendations for Other Services       Precautions / Restrictions Precautions Precautions: Fall    Mobility  Bed Mobility   Bed Mobility: Rolling;Sidelying to Sit;Sit to Sidelying Rolling: +2 for physical assistance;+2 for safety/equipment;Total assist Sidelying to sit: Total assist   Sit to supine: Max assist;Total assist   General bed mobility comments: patient required assist for legs and trunk, then did place self to sidelying with assist for legs onto bed.  Transfers                 General transfer comment: NT,  Ambulation/Gait                 Stairs             Wheelchair Mobility    Modified Rankin (Stroke Patients Only)       Balance Overall balance assessment: Needs assistance Sitting-balance support: Bilateral upper extremity supported;Feet supported Sitting balance-Leahy Scale: Zero Sitting balance - Comments: patient holding to rail on left  hand and pulling self to the left. Pried left hand from rail, remained total assist for balance.                                    Cognition  Arousal/Alertness: Lethargic Behavior During Therapy: Flat affect Overall Cognitive Status: Difficult to assess                                 General Comments: multimodal cues for mobilizing legs, placing hands on bed., when questioned, indicated hospital, given choices. reports lives with wife and daughtr, no othe r info, began to reyrun self to sidelying and did not participate further.      Exercises      General Comments        Pertinent Vitals/Pain Faces Pain Scale: Hurts little more Pain Location: generalized Pain Descriptors / Indicators: Other (Comment);Grimacing Pain Intervention(s): Monitored during session    Home Living                      Prior Function            PT Goals (current goals can now be found in the care plan section) Progress towards PT goals: Progressing toward goals    Frequency    Min 2X/week      PT Plan Current plan remains appropriate    Co-evaluation PT/OT/SLP Co-Evaluation/Treatment: Yes Reason for Co-Treatment: For patient/therapist safety PT goals addressed during session: Mobility/safety with mobility  AM-PAC PT "6 Clicks" Mobility   Outcome Measure  Help needed turning from your back to your side while in a flat bed without using bedrails?: Total Help needed moving from lying on your back to sitting on the side of a flat bed without using bedrails?: Total Help needed moving to and from a bed to a chair (including a wheelchair)?: Total Help needed standing up from a chair using your arms (e.g., wheelchair or bedside chair)?: Total Help needed to walk in hospital room?: Total Help needed climbing 3-5 steps with a railing? : Total 6 Click Score: 6    End of Session Equipment Utilized During Treatment: Oxygen Activity Tolerance: Patient limited by fatigue(non participation) Patient left: in bed;with call bell/phone within reach;with nursing/sitter in room Nurse Communication: Mobility  status;Other (comment);Need for lift equipment PT Visit Diagnosis: Other abnormalities of gait and mobility (R26.89);Muscle weakness (generalized) (M62.81)     Time: 1450-1510 PT Time Calculation (min) (ACUTE ONLY): 20 min  Charges:  $Therapeutic Activity: 8-22 mins                     Tresa Endo PT Acute Rehabilitation Services  Office [phone]    Claretha Cooper 06/13/2019, 3:47 PM

## 2019-06-13 NOTE — Progress Notes (Signed)
Spoke with Beatryce Colombo(pt daughter) updated on status.  All questions answered at this time.

## 2019-06-13 NOTE — Progress Notes (Signed)
Nutrition Follow-up  DOCUMENTATION CODES:   Not applicable  INTERVENTION:   Ensure Enlive po BID, each supplement provides 350 kcal and 20 grams of protein  RN encouraging PO intake   NUTRITION DIAGNOSIS:   Inadequate oral intake related to acute illness as evidenced by NPO status. Ongoing  GOAL:   Patient will meet greater than or equal to 90% of their needs Not met.   MONITOR:   PO intake, Supplement acceptance  REASON FOR ASSESSMENT:   Consult, Ventilator Enteral/tube feeding initiation and management  ASSESSMENT:   84 yo male admitted on 10/15 with ARDS due to COVID-19 pneimonia requiring intubation; Pt diagnosed on 10/5 with COVID-19. PMH includes anemia, COPD, CAD, MI, CHF, CKD III, GERD, HTN  10/15 Admit, intubated 10/16 extubated pt now DNI/DNR  Per RN pt has no appetite and not eating. Pt only had an applesauce today. He likes little debbie cakes, RN working to get pt food preferences.    Labs: sodium 148 (H), K+ 3 (L), PO4: 2.3 (L)  Meds: decadron, Vitamin C, zin sulfate    Diet Order:   Diet Order            DIET - DYS 1 Room service appropriate? Yes; Fluid consistency: Thin  Diet effective now              EDUCATION NEEDS:   Not appropriate for education at this time  Skin:  Skin Assessment: Reviewed RN Assessment  Last BM:  10/19 type 7  Height:   Ht Readings from Last 1 Encounters:  06/09/19 '5\' 8"'$  (1.727 m)    Weight:   Wt Readings from Last 1 Encounters:  06/10/19 82 kg    Ideal Body Weight:     BMI:  Body mass index is 27.49 kg/m.  Estimated Nutritional Needs:   Kcal:  3143-8887  Protein:  120-160 g  Fluid:  2 L/day  Maylon Peppers RD, LDN, CNSC [phone] Pager [phone] After Hours Pager

## 2019-06-13 NOTE — Evaluation (Addendum)
Occupational Therapy Evaluation Patient Details Name: [name] MRN: [id number] DOB: 06-10-1935 Today's Date: 06/13/2019    History of Present Illness 84 y/o male admitted from [facility] for ARDS from COVID PNA. Lives in SNF at baseline and has multi comorbidities. Hx: anemia, angina, on anticoagulant, atherosclerosis, CAD, HTN, HLD, COPD, CHF EF 35-40%, CKD, MI, prostate cancer ICD (06/13). was intubated until 10/16.   Clinical Impression   This 84 y/o male presents with the above. PTA pt in SNF [facility]) for rehab services. Pt minimally participatory this session. He required +2 assist for safe completion of bed mobility, tolerating sitting EOB only for brief period of time (<5 min) before self initiating returning to sidelying (reports due to dizziness, fatigue). VSS throughout. Pt currently totalA for ADL. Pt with delayed processing and minimal responses/verbalizations during session (suspect combination of pt being [facility] and with processing difficulties). He will benefit from continued acute OT services and recommend follow up therapy services in SNF setting after discharge to maximize his safety and independence with ADL and mobility. Will follow.      Follow Up Recommendations  SNF;Supervision/Assistance - 24 hour    Equipment Recommendations  Other (comment)(TBD)           Precautions / Restrictions Precautions Precautions: Fall Restrictions Weight Bearing Restrictions: No      Mobility Bed Mobility   Bed Mobility: Rolling;Sidelying to Sit;Sit to Sidelying Rolling: +2 for physical assistance;+2 for safety/equipment;Total assist Sidelying to sit: Total assist   Sit to supine: Max assist;Total assist   General bed mobility comments: patient required assist for legs and trunk, then did place self to sidelying with assist for legs onto bed.  Transfers                 General transfer comment: NT,    Balance Overall balance assessment: Needs  assistance Sitting-balance support: Bilateral upper extremity supported;Feet supported Sitting balance-Leahy Scale: Zero Sitting balance - Comments: patient holding to rail on left hand and pulling self to the left. able to remove hand from rail and place on bed for pt to self assist for balance but remained total assist                                   ADL either performed or assessed with clinical judgement   ADL Overall ADL's : Needs assistance/impaired                                       General ADL Comments: pt totalA for ADL at this time, tolerated sitting EOB only for short period, intiating transitioning himself back to bed, reports due to dizziness and fatigue      Vision         Perception     Praxis      Pertinent Vitals/Pain Pain Assessment: Faces Faces Pain Scale: Hurts little more Pain Location: generalized Pain Descriptors / Indicators: Grimacing Pain Intervention(s): Monitored during session;Repositioned;Limited activity within patient's tolerance     Hand Dominance     Extremity/Trunk Assessment Upper Extremity Assessment Upper Extremity Assessment: Generalized weakness;Difficult to assess due to impaired cognition   Lower Extremity Assessment Lower Extremity Assessment: Defer to PT evaluation       Communication Communication Communication: Receptive difficulties;Expressive difficulties;HOH(difficult to ascertain whether pt HOH or other difficulties)   Cognition  Arousal/Alertness: Lethargic Behavior During Therapy: Flat affect Overall Cognitive Status: Difficult to assess                                 General Comments: multimodal cues for mobilizing legs, placing hands on bed., when questioned, indicated hospital (given choices). reports lives with wife and daughtr, no other info provided, began to return himself to sidelying and did not participate further.   General Comments       Exercises      Shoulder Instructions      Home Living Family/patient expects to be discharged to:: Skilled nursing facility                                        Prior Functioning/Environment Level of Independence: Needs assistance        Comments: pt most recently in [facility] for rehab, required assist for mobility and ADLs; prior to this pt was living with spouse and daughter        OT Problem List: Decreased strength;Decreased range of motion;Decreased activity tolerance;Impaired balance (sitting and/or standing);Decreased cognition;Decreased safety awareness;Decreased knowledge of use of DME or AE;Cardiopulmonary status limiting activity      OT Treatment/Interventions: Self-care/ADL training;Therapeutic exercise;Neuromuscular education;DME and/or AE instruction;Therapeutic activities;Balance training;Patient/family education;Cognitive remediation/compensation;Energy conservation    OT Goals(Current goals can be found in the care plan section) Acute Rehab OT Goals Patient Stated Goal: none stated OT Goal Formulation: Patient unable to participate in goal setting Time For Goal Achievement: 06/27/19 Potential to Achieve Goals: Good  OT Frequency: Min 2X/week   Barriers to D/C:            Co-evaluation PT/OT/SLP Co-Evaluation/Treatment: Yes Reason for Co-Treatment: Complexity of the patient's impairments (multi-system involvement);For patient/therapist safety PT goals addressed during session: Mobility/safety with mobility OT goals addressed during session: ADL's and self-care;Strengthening/ROM      AM-PAC OT "6 Clicks" Daily Activity     Outcome Measure Help from another person eating meals?: Total Help from another person taking care of personal grooming?: Total Help from another person toileting, which includes using toliet, bedpan, or urinal?: Total Help from another person bathing (including washing, rinsing, drying)?: Total Help from another person to  put on and taking off regular upper body clothing?: Total Help from another person to put on and taking off regular lower body clothing?: Total 6 Click Score: 6   End of Session Equipment Utilized During Treatment: Oxygen Nurse Communication: Mobility status  Activity Tolerance: Patient limited by fatigue Patient left: in bed;with call bell/phone within reach;with nursing/sitter in room  OT Visit Diagnosis: Muscle weakness (generalized) (M62.81);Other symptoms and signs involving cognitive function                Time: 1445-1510 OT Time Calculation (min): 25 min Charges:  OT General Charges $OT Visit: 1 Visit OT Evaluation $OT Eval Moderate Complexity: [place], OT [employer] Pager [phone] Office [place] 06/13/2019, 4:16 PM

## 2019-06-13 NOTE — Progress Notes (Signed)
84 yo male from nursing home with COVID 19 pneumonia and acute hypoxic respiratory failure requiring intubation.  Hx of demenia, CAD, systolic CHF, COPD, CKD.  Treated with remdesivir and decadron.  Extubated 10/16.  Family opted for DNR/DNI status 10/18.  More alert and O2 needs improving 10/19.    PCCM can be available as needed.  Defer further management to hospitalist team.  D/w Dr. Sherral Hammers.  Chesley Mires, MD [facility] Pulmonary/Critical Care 06/13/2019, 1:59 PM

## 2019-06-13 NOTE — Progress Notes (Addendum)
PROGRESS NOTE    FONG MCCARRY  BZJ:[id number] DOB: Mar 19, 1935 DOA: 06/09/2019 PCP: Redmond School, MD   Brief Narrative:  84 y.o. WM PMHx  anemia,  asthma/COPD liters O2 at home,CAD, history MI, history of post MI ventricular thrombus (, anticoagulated on warfarin) chronic systolic heart failure, HTN, s/p ICD placement, CKD stage III, GERD,  prostate cancer,,  who is coming to this facility from the emergency department from [facility] after presenting there due to dyspnea and AMS.  The patient has being intubated.  He was in that facility admitted from September 22 through October 3.  He was diagnosed on 10/05 with COVID-19.  Per sending facility physician, there is an outbreak of SARS 2 his nursing facility [facility]).  No further history is available.  ED Course: At the time of transfer presentation, vital signs at the facility where BP 96/72, heart rate 73, RR 14 and O2 sat 99%.  The patient was still on Levophed and Versed infusion.  His most recent ABG PH 7.35, PCO2 58, PO2 329, bicarbonate 32, TCO2 33.8, BE 5.9, O2 sat 99.5. His CBC showed a white count was 10, hemoglobin 11.9 g/dL and platelets 229.  BUN and creatinine were increasing from yesterday 39/1.5 to 2-day 46/1 0.9 mg/dL.  LFTs were reported normal.  PT was 15.4 seconds and INR 1.5.  Ammonia level was 45 mol/L.He is proBNP was 1411 and CRP 14.55 mg/dL.  He is urinalysis showed leukocyturia and he has a recent urine culture showing pansensitive Enterococcus.  Imaging is significant for chronic COPD changes and LLL infiltrate.  Please see records from [facility] for further detail.   Subjective:  10/19 a lot more alert today, A/O x2 can say name, when prompted can tell as he is in the hospital.  Able to eat some of his breakfast, and take medications.  Negative CP, negative S OB, negative abdominal pain.   Assessment & Plan:   Principal Problem:   Acute respiratory distress syndrome (ARDS) due to COVID-19 virus [facility])  Active Problems:   Essential hypertension   Coronary atherosclerosis   Chronic systolic congestive heart failure   CKD (chronic kidney disease) stage 3, GFR 30-59 ml/min (HCC)   COPD (chronic obstructive pulmonary disease) (HCC)   AKI (acute kidney injury) (HCC)   Urinary tract infection   Acute respiratory failure with hypoxia (HCC)   Pneumonia due to COVID-19 virus   Acute renal failure superimposed on stage 3b chronic kidney disease (HCC)   Enterococcus UTI   Hypernatremia  Acute on chronic respiratory failure with hypoxia/Covid pneumonia -Patient on 2 L O2 at baseline -Remdesivir per pharmacy protocol -Decadron 6 mg daily -Combivent QID -Flutter valve -10/16 extubated -10/18 patient extremely weak not protecting airway well.  Plan is to avoid reintubation as this would significantly increase his mortality risk.  Dr. Patsey Berthold to spoke with family and they concur. -Patient was n.p.o. secondary to lethargy will obtain official swallow study. -Frequent deep suctioning PRN -10/19 consult for swallow study  COVID-19 Labs  Recent Labs    06/11/19 0545 06/12/19 0542  DDIMER 0.58* 0.43  FERRITIN 238 208  CRP 1.0* <0.8    Lab Results  Component Value Date   [place] NEGATIVE 03/22/2019   ARDS -See Covid pneumonia  COPD -See Covid pneumonia  Acute on CKD stage III (baseline Cr ~1.7) Recent Labs  Lab 06/09/19 0535 06/10/19 0550 06/11/19 0545 06/12/19 0542 06/13/19 0529  CREATININE 2.07* 1.72* 1.31* 1.06 1.09  -Improving.  Hydrate slowly.  See  hypernatremia  Enterococcus UTI (pansensitive) -Complete 7-day course antibiotics  Chronic systolic CHF -23/76/2831 EF 35 to 40% with diffuse hypokinesis -06/12/2019 echocardiogram shows worsening cardiac function EF 20 to 25% see results below -Strict in and out +2.2 L -Daily weight Filed Weights   06/09/19 0600 06/10/19 0500  Weight: 81.5 kg 82 kg  -Transfuse for hemoglobin<7  Essential HTN -Was on Levophed,  now DC'd -Currently controlled without BP medication monitor closely  CAD -Aspirin 81 mg daily,  -Lipitor 80 mg daily  Hypernatremia -Secondary to dehydration -Continue slow hydration; D5W 74ml/hr -Monitor closely for fluid overload given his decreased cardiac function  Hypokalemia -Potassium goal> 4 -Potassium IV 60 mEq    Goals of care -10/16 PT/OT consult placed; PT recommends SNF, awaiting OT recommendation -10/18 Dr. Marchelle Gearing speaking with family concerning their goals of care, given that patient's respiratory status poor and patient appears to be have worsening status..      DVT prophylaxis: Subcu Lovenox Code Status: Full Family Communication:/10/19 spoke with wife explained plan of care answered all questions Disposition Plan: TBD   Consultants:  PCCM   Procedures/Significant Events:  10/18 PCXR;Slight worsening of atelectasis at the lung bases following Extubation 10/18 echocardiogram;Left Ventricle: EF= 20 to 25%. The left ventricle has severely decreased function.  -Left ventricular diastolic Doppler parameters are consistent with impaired relaxation pattern -Left Atrium: moderately dilated. -Additional Comments: A pacer wire is visualized.   I have personally reviewed and interpreted all radiology studies and my findings are as above.  VENTILATOR SETTINGS: HFNC Flow rate; 4 L/min    Cultures 10/16 MRSA by PCR positive Urine positive Enterococcus??  Unable to locate culture.  Prior to admission?   Antimicrobials: Anti-infectives (From admission, onward)   Start     Stop   06/12/19 2000  ampicillin (OMNIPEN) 2 g in sodium chloride 0.9 % 100 mL IVPB     06/15/19 2359   06/11/19 0900  ampicillin (OMNIPEN) 2 g in sodium chloride 0.9 % 100 mL IVPB  Status:  Discontinued     06/12/19 1502   06/10/19 1000  remdesivir 100 mg in sodium chloride 0.9 % 250 mL IVPB     06/13/19 0951   06/09/19 1400  ampicillin (OMNIPEN) 2 g in sodium chloride  0.9 % 100 mL IVPB  Status:  Discontinued     06/09/19 1209   06/09/19 1000  remdesivir 200 mg in sodium chloride 0.9 % 250 mL IVPB     06/09/19 1127   06/09/19 0800  levofloxacin (LEVAQUIN) IVPB 750 mg  Status:  Discontinued     06/09/19 1206       Devices    LINES / TUBES:      Continuous Infusions: . ampicillin (OMNIPEN) IV Stopped (06/13/19 0553)  . dextrose 50 mL/hr at 06/13/19 0800  . feeding supplement (VITAL AF 1.2 CAL)    . potassium chloride    . remdesivir 100 mg in NS 250 mL Stopped (06/12/19 0912)     Objective: Vitals:   06/13/19 0500 06/13/19 0600 06/13/19 0700 06/13/19 0800  BP: (!) 128/91 111/73 128/80 114/66  Pulse: 80 82 85 83  Resp: (!) 24 18 (!) 22 19  Temp: 97.6 F (36.4 C)     TempSrc: Oral     SpO2: 94% 90% 90% (!) 88%  Weight:      Height:        Intake/Output Summary (Last 24 hours) at 06/13/2019 0846 Last data filed at 06/13/2019 0800 Gross per 24  hour  Intake 3087.2 ml  Output 1700 ml  Net 1387.2 ml   Filed Weights   06/09/19 0600 06/10/19 0500  Weight: 81.5 kg 82 kg   Physical Exam:  General: A/O x2 (with prompting will state he is in the hospital), much more alert today positive acute on chronic respiratory distress Eyes: negative scleral hemorrhage, negative anisocoria, negative icterus ENT: Negative Runny nose, negative gingival bleeding, Neck:  Negative scars, masses, torticollis, lymphadenopathy, JVD Lungs: Diffuse poor air movement bilaterally without wheezes or crackles Cardiovascular: Regular rate and rhythm without murmur gallop or rub normal S1 and S2 Abdomen: negative abdominal pain, nondistended, positive soft, bowel sounds, no rebound, no ascites, no appreciable mass Extremities: No significant cyanosis, clubbing, or edema bilateral lower extremities Skin: Negative rashes, lesions, ulcers Psychiatric:  Negative depression, negative anxiety, negative fatigue, negative mania  Central nervous system:  Cranial nerves  II through XII intact, tongue/uvula midline, all extremities muscle strength 3/5, sensation intact throughout, negative dysarthria, negative expressive aphasia, negative receptive aphasia.      Data Reviewed: Care during the described time interval was provided by me .  I have reviewed this patient's available data, including medical history, events of note, physical examination, and all test results as part of my evaluation.   CBC: Recent Labs  Lab 06/09/19 0535 06/10/19 0550 06/10/19 0624 06/11/19 0545 06/12/19 0542 06/12/19 1042 06/13/19 0529  WBC 9.3 9.6  --  11.0* 13.2*  --  13.5*  NEUTROABS 8.2* 8.2*  --  9.4* 11.3*  --  11.4*  HGB 10.4* 9.9* 10.2* 9.5* 9.7* 9.9* 10.4*  HCT 36.9* 34.7* 30.0* 33.9* 34.4* 29.0* 35.6*  MCV 97.4 96.4  --  98.0 98.0  --  95.4  PLT 196 155  --  151 154  --  646   Basic Metabolic Panel: Recent Labs  Lab 06/09/19 0535 06/09/19 1650 06/10/19 0550 06/10/19 0624 06/11/19 0545 06/12/19 0542 06/12/19 1042 06/13/19 0529  NA 150*  --  154* 152* 159* 153* 150* 148*  K 4.0  --  3.6 3.5 4.0 3.6 3.8 3.0*  CL 109  --  114*  --  118* 112*  --  103  CO2 29  --  27  --  33* 32  --  38*  GLUCOSE 239*  --  165*  --  161* 149*  --  134*  BUN 44*  --  48*  --  44* 31*  --  26*  CREATININE 2.07*  --  1.72*  --  1.31* 1.06  --  1.09  CALCIUM 8.0*  --  8.4*  --  8.0* 7.9*  --  8.1*  MG 2.3 2.3 2.4  --  2.5* 2.2  --  2.0  PHOS 2.8 2.6 2.5  --  3.9 2.9  --  2.3*   GFR: Estimated Creatinine Clearance: 50.6 mL/min (by C-G formula based on SCr of 1.09 mg/dL). Liver Function Tests: Recent Labs  Lab 06/09/19 0535 06/10/19 0550 06/11/19 0545 06/12/19 0542 06/13/19 0529  AST 30 28 25 29  35  ALT 25 24 24 25 28   ALKPHOS 61 56 51 62 63  BILITOT 0.9 0.8 0.4 0.3 0.4  PROT 6.2* 6.1* 5.6* 5.7* 5.7*  ALBUMIN 2.8* 2.8* 2.6* 2.6* 2.7*   No results for input(s): LIPASE, AMYLASE in the last 168 hours. Recent Labs  Lab 06/09/19 0535  AMMONIA 22   Coagulation  Profile: No results for input(s): INR, PROTIME in the last 168 hours. Cardiac Enzymes: No results for  input(s): CKTOTAL, CKMB, CKMBINDEX, TROPONINI in the last 168 hours. BNP (last 3 results) No results for input(s): PROBNP in the last 8760 hours. HbA1C: No results for input(s): HGBA1C in the last 72 hours. CBG: Recent Labs  Lab 06/09/19 1936 06/10/19 0011 06/10/19 0351 06/10/19 0819 06/10/19 1232  GLUCAP 136* 161* 147* 136* 156*   Lipid Profile: Recent Labs    06/12/19 0542  TRIG 105   Thyroid Function Tests: No results for input(s): TSH, T4TOTAL, FREET4, T3FREE, THYROIDAB in the last 72 hours. Anemia Panel: Recent Labs    06/11/19 0545 06/12/19 0542  FERRITIN 238 208   Urine analysis:    Component Value Date/Time   COLORURINE AMBER (A) 05/14/2019 0000   APPEARANCEUR CLOUDY (A) 05/14/2019 0000   LABSPEC 1.023 05/14/2019 0000   PHURINE 5.0 05/14/2019 0000   GLUCOSEU NEGATIVE 05/14/2019 0000   HGBUR LARGE (A) 05/14/2019 0000   BILIRUBINUR NEGATIVE 05/14/2019 0000   KETONESUR NEGATIVE 05/14/2019 0000   PROTEINUR 100 (A) 05/14/2019 0000   UROBILINOGEN 0.2 06/10/2015 1430   NITRITE NEGATIVE 05/14/2019 0000   LEUKOCYTESUR NEGATIVE 05/14/2019 0000   Sepsis Labs: @LABRCNTIP (procalcitonin:4,lacticidven:4)  ) Recent Results (from the past 240 hour(s))  MRSA PCR Screening     Status: Abnormal   Collection Time: 06/10/19 12:59 PM   Specimen: Nasal Mucosa; Nasopharyngeal  Result Value Ref Range Status   MRSA by PCR POSITIVE (A) NEGATIVE Final    Comment:        The GeneXpert MRSA Assay (FDA approved for NASAL specimens only), is one component of a comprehensive MRSA colonization surveillance program. It is not intended to diagnose MRSA infection nor to guide or monitor treatment for MRSA infections. RESULT CALLED TO, READ BACK BY AND VERIFIED WITH: B.RUDD,RN [id number] @0138  BY V.WILKINS Performed at [facility] [address]  [place], [place] [address]          Radiology Studies: Dg Chest Port 1 View  Result Date: 06/12/2019 CLINICAL DATA:  Hypoxemia EXAM: PORTABLE CHEST 1 VIEW COMPARISON:  06/10/2019 FINDINGS: Endotracheal tube removed. Pacemaker/AICD remains in place. Coronary artery stents again visible. Heart size is normal. No evidence of heart failure or effusion. Slight worsening of atelectasis at the lung bases following extubation. IMPRESSION: Slight worsening of atelectasis at the lung bases following extubation. Electronically Signed   By: Nelson Chimes M.D.   On: 06/12/2019 07:12        Scheduled Meds: . aspirin  81 mg Oral Daily  . atorvastatin  80 mg Oral q1800  . budesonide  1 puff Inhalation BID  . Chlorhexidine Gluconate Cloth  6 each Topical Daily  . dexamethasone (DECADRON) injection  6 mg Intravenous Q24H  . enoxaparin (LOVENOX) injection  40 mg Subcutaneous Q12H  . escitalopram  20 mg Per Tube Daily  . Ipratropium-Albuterol  1 puff Inhalation Q6H  . mouth rinse  15 mL Mouth Rinse BID  . montelukast  10 mg Oral QHS  . multivitamin with minerals  1 tablet Oral Daily  . mupirocin ointment  1 application Nasal BID  . pantoprazole  40 mg Oral Daily  . [START ON 06/15/2019] pneumococcal 23 valent vaccine  0.5 mL Intramuscular Tomorrow-1000  . tamsulosin  0.4 mg Oral Daily  . vitamin C  500 mg Oral Daily  . zinc sulfate  220 mg Oral Daily   Continuous Infusions: . ampicillin (OMNIPEN) IV Stopped (06/13/19 0553)  . dextrose 50 mL/hr at 06/13/19 0800  . feeding supplement (VITAL AF 1.2  CAL)    . potassium chloride    . remdesivir 100 mg in NS 250 mL Stopped (06/12/19 0912)     LOS: 4 days   The patient is critically ill with multiple organ systems failure and requires high complexity decision making for assessment and support, frequent evaluation and titration of therapies, application of advanced monitoring technologies and extensive interpretation of multiple databases. Critical Care  Time devoted to patient care services described in this note  Time spent: 40 minutes     , Geraldo Docker, MD [facility] Pager [phone]  If 7PM-7AM, please contact night-coverage [email or web address] Password [email or web address] 06/13/2019, 8:46 AM

## 2019-06-13 NOTE — Progress Notes (Signed)
Spoke with and updated pt's daughter [name]. I also answered questions she had.

## 2019-06-13 NOTE — Progress Notes (Signed)
Pt has become more alert throughout the day. He is alert to self but has began talking/ interacting more with the staff. This morning he was able to eat a cup of apple sauce and take his oral medications without any swallowing concerns. He does have a noted decrease in appetite, which I have encouraged him to try and eat small amounts when he can. Will continue to monitor patient

## 2019-06-13 NOTE — Progress Notes (Signed)
Upon assessment this AM pt's left forearm on the anterior aspect had a large area of purple, pink and black bruising. Area is cool to touch with no drainage present. Pt does not complain of any pain at the site. The night shift RN verified that the bruising had been present prior to her shift and had gotten worse. I removed pts PIV that was placed in that arm. Dr Sherral Hammers was notified of bruising and assessed pt. No new orders at this time. Will continue to monitor it.

## 2019-06-14 LAB — CBC WITH DIFFERENTIAL/PLATELET
Abs Immature Granulocytes: 0.12 10*3/uL — ABNORMAL HIGH (ref 0.00–0.07)
Basophils Absolute: 0 10*3/uL (ref 0.0–0.1)
Basophils Relative: 0 %
Eosinophils Absolute: 0.1 10*3/uL (ref 0.0–0.5)
Eosinophils Relative: 1 %
HCT: 32.4 % — ABNORMAL LOW (ref 39.0–52.0)
Hemoglobin: 9.8 g/dL — ABNORMAL LOW (ref 13.0–17.0)
Immature Granulocytes: 1 %
Lymphocytes Relative: 7 %
Lymphs Abs: 0.8 10*3/uL (ref 0.7–4.0)
MCH: 28 pg (ref 26.0–34.0)
MCHC: 30.2 g/dL (ref 30.0–36.0)
MCV: 92.6 fL (ref 80.0–100.0)
Monocytes Absolute: 0.8 10*3/uL (ref 0.1–1.0)
Monocytes Relative: 7 %
Neutro Abs: 9 10*3/uL — ABNORMAL HIGH (ref 1.7–7.7)
Neutrophils Relative %: 84 %
Platelets: 153 10*3/uL (ref 150–400)
RBC: 3.5 MIL/uL — ABNORMAL LOW (ref 4.22–5.81)
RDW: 14.5 % (ref 11.5–15.5)
WBC: 10.7 10*3/uL — ABNORMAL HIGH (ref 4.0–10.5)
nRBC: 0 % (ref 0.0–0.2)

## 2019-06-14 LAB — COMPREHENSIVE METABOLIC PANEL
ALT: 32 U/L (ref 0–44)
AST: 51 U/L — ABNORMAL HIGH (ref 15–41)
Albumin: 2.4 g/dL — ABNORMAL LOW (ref 3.5–5.0)
Alkaline Phosphatase: 58 U/L (ref 38–126)
Anion gap: 8 (ref 5–15)
BUN: 26 mg/dL — ABNORMAL HIGH (ref 8–23)
CO2: 33 mmol/L — ABNORMAL HIGH (ref 22–32)
Calcium: 7.7 mg/dL — ABNORMAL LOW (ref 8.9–10.3)
Chloride: 104 mmol/L (ref 98–111)
Creatinine, Ser: 1.03 mg/dL (ref 0.61–1.24)
GFR calc Af Amer: >60 mL/min (ref >60)
GFR calc non Af Amer: >60 mL/min (ref >60)
Glucose, Bld: 106 mg/dL — ABNORMAL HIGH (ref 70–99)
Potassium: 3.9 mmol/L (ref 3.5–5.1)
Sodium: 145 mmol/L (ref 135–145)
Total Bilirubin: 1.1 mg/dL (ref 0.3–1.2)
Total Protein: 5.3 g/dL — ABNORMAL LOW (ref 6.5–8.1)

## 2019-06-14 LAB — PHOSPHORUS: Phosphorus: 1.7 mg/dL — ABNORMAL LOW (ref 2.5–4.6)

## 2019-06-14 LAB — C-REACTIVE PROTEIN: CRP: <0.8 mg/dL (ref <1.0)

## 2019-06-14 LAB — D-DIMER, QUANTITATIVE: D-Dimer, Quant: 0.62 ug/mL-FEU — ABNORMAL HIGH (ref 0.00–0.50)

## 2019-06-14 LAB — MAGNESIUM: Magnesium: 2.1 mg/dL (ref 1.7–2.4)

## 2019-06-14 LAB — FERRITIN: Ferritin: 141 ng/mL (ref 24–336)

## 2019-06-14 MED ORDER — POTASSIUM PHOSPHATES 15 MMOLE/5ML IV SOLN
15.0000 mmol | Freq: Once | INTRAVENOUS | Status: AC
  Administered 2019-06-14: 15 mmol via INTRAVENOUS
  Filled 2019-06-14: qty 5

## 2019-06-14 MED ORDER — SODIUM CHLORIDE 0.9% FLUSH: 10.0000 mL | INTRAVENOUS | Status: DC | PRN

## 2019-06-14 MED ORDER — DEXTROSE 5 % IV SOLN
INTRAVENOUS | Status: DC
  Administered 2019-06-14 – 2019-06-15 (×2): via INTRAVENOUS

## 2019-06-14 MED ORDER — SODIUM CHLORIDE 0.9% FLUSH
10.0000 mL | Freq: Two times a day (BID) | INTRAVENOUS | Status: DC
  Administered 2019-06-14 – 2019-06-19 (×10): 10 mL

## 2019-06-14 NOTE — Progress Notes (Signed)
Arbutus Ped, RN on PCU and gave report on patient

## 2019-06-14 NOTE — Progress Notes (Signed)
PROGRESS NOTE    [name]  ZOX:[id number] DOB: May 21, 1935 DOA: 06/09/2019 PCP: Redmond School, MD   Brief Narrative:  84 y.o. WM PMHx  anemia,  asthma/COPD liters O2 at home,CAD, history MI, history of post MI ventricular thrombus (, anticoagulated on warfarin) chronic systolic heart failure, HTN, s/p ICD placement, CKD stage III, GERD,  prostate cancer,,  who is coming to this facility from the emergency department from [facility] after presenting there due to dyspnea and AMS.  The patient has being intubated.  He was in that facility admitted from September 22 through October 3.  He was diagnosed on 10/05 with COVID-19.  Per sending facility physician, there is an outbreak of SARS 2 his nursing facility [facility]).  No further history is available.  ED Course: At the time of transfer presentation, vital signs at the facility where BP 96/72, heart rate 73, RR 14 and O2 sat 99%.  The patient was still on Levophed and Versed infusion.  His most recent ABG PH 7.35, PCO2 58, PO2 329, bicarbonate 32, TCO2 33.8, BE 5.9, O2 sat 99.5. His CBC showed a white count was 10, hemoglobin 11.9 g/dL and platelets 229.  BUN and creatinine were increasing from yesterday 39/1.5 to 2-day 46/1 0.9 mg/dL.  LFTs were reported normal.  PT was 15.4 seconds and INR 1.5.  Ammonia level was 45 mol/L.He is proBNP was 1411 and CRP 14.55 mg/dL.  He is urinalysis showed leukocyturia and he has a recent urine culture showing pansensitive Enterococcus.  Imaging is significant for chronic COPD changes and LLL infiltrate.  Please see records from [facility] for further detail.   Subjective:  10/20 A/O x2 (still requires prompting but then will realize he is in the hospital), does not know when, why.  Much more interactive.  Negative CP, negative S OB, negative abdominal pain    Assessment & Plan:   Principal Problem:   Acute respiratory distress syndrome (ARDS) due to COVID-19 virus [facility]) Active Problems:  Essential hypertension   Coronary atherosclerosis   Chronic systolic congestive heart failure   CKD (chronic kidney disease) stage 3, GFR 30-59 ml/min (HCC)   COPD (chronic obstructive pulmonary disease) (HCC)   AKI (acute kidney injury) (HCC)   Urinary tract infection   Acute respiratory failure with hypoxia (HCC)   Pneumonia due to COVID-19 virus   Acute renal failure superimposed on stage 3b chronic kidney disease (HCC)   Enterococcus UTI   Hypernatremia  Acute on chronic respiratory failure with hypoxia/Covid pneumonia -Patient on 2 L O2 at baseline -Remdesivir per pharmacy protocol -Decadron 6 mg daily -Combivent QID -Flutter valve -10/16 extubated -10/20 passed swallow evaluation dysphagia 2 thin liquid  COVID-19 Labs  Recent Labs    06/12/19 0542 06/13/19 0529  DDIMER 0.43 0.61*  FERRITIN 208 176  CRP <0.8 <0.8    Lab Results  Component Value Date   [place] NEGATIVE 03/22/2019   ARDS -See Covid pneumonia  COPD -See Covid pneumonia  Acute on CKD stage III (baseline Cr ~1.7) Recent Labs  Lab 06/09/19 0535 06/10/19 0550 06/11/19 0545 06/12/19 0542 06/13/19 0529  CREATININE 2.07* 1.72* 1.31* 1.06 1.09  -10/20 decrease D5W 30 ml/hr   Enterococcus UTI (pansensitive) -Complete 7-day course antibiotics  Chronic systolic CHF -81/19/1478 EF 35 to 40% with diffuse hypokinesis -06/12/2019 echocardiogram shows worsening cardiac function EF 20 to 25% see results below -Strict in and out +2.9 L -Daily weight Filed Weights   06/09/19 0600 06/10/19 0500  Weight: 81.5 kg  82 kg  -Transfuse for hemoglobin<7  Essential HTN -Was on Levophed, now DC'd -Currently controlled without BP medication monitor closely  CAD -Aspirin 81 mg daily,  -Lipitor 80 mg daily  Hypernatremia -Secondary to dehydration -Continue slow hydration; D5W 70ml/hr -Monitor closely for fluid overload given his decreased cardiac function   Hypokalemia -Potassium goal> 4  -K-Phos 15 mmol  Hypophosphatemia -See hypokalemia    Goals of care -10/16 PT/OT consult placed; recommend SNF -10/18 Dr. Marchelle Gearing speaking with family concerning their goals of care, given that patient's respiratory status poor and patient appears to be have worsening status..      DVT prophylaxis: Subcu Lovenox Code Status: Full Family Communication:/10/19 spoke with wife explained plan of care answered all questions Disposition Plan: TBD   Consultants:  PCCM   Procedures/Significant Events:  10/18 PCXR;Slight worsening of atelectasis at the lung bases following Extubation 10/18 echocardiogram;Left Ventricle: EF= 20 to 25%. The left ventricle has severely decreased function.  -Left ventricular diastolic Doppler parameters are consistent with impaired relaxation pattern -Left Atrium: moderately dilated. -Additional Comments: A pacer wire is visualized.   I have personally reviewed and interpreted all radiology studies and my findings are as above.  VENTILATOR SETTINGS: HFNC Flow rate; 6 L/min SPO2 90%    Cultures 10/16 MRSA by PCR positive Urine positive Enterococcus??  Unable to locate culture.  Prior to admission?   Antimicrobials: Anti-infectives (From admission, onward)   Start     Stop   06/12/19 2000  ampicillin (OMNIPEN) 2 g in sodium chloride 0.9 % 100 mL IVPB     06/15/19 2359   06/11/19 0900  ampicillin (OMNIPEN) 2 g in sodium chloride 0.9 % 100 mL IVPB  Status:  Discontinued     06/12/19 1502   06/10/19 1000  remdesivir 100 mg in sodium chloride 0.9 % 250 mL IVPB     06/13/19 0951   06/09/19 1400  ampicillin (OMNIPEN) 2 g in sodium chloride 0.9 % 100 mL IVPB  Status:  Discontinued     06/09/19 1209   06/09/19 1000  remdesivir 200 mg in sodium chloride 0.9 % 250 mL IVPB     06/09/19 1127   06/09/19 0800  levofloxacin (LEVAQUIN) IVPB 750 mg  Status:  Discontinued     06/09/19 1206       Devices    LINES / TUBES:       Continuous Infusions: . ampicillin (OMNIPEN) IV 2 g (06/13/19 2340)  . dextrose 50 mL/hr at 06/14/19 0110  . feeding supplement (VITAL AF 1.2 CAL)       Objective: Vitals:   06/14/19 0352 06/14/19 0400 06/14/19 0500 06/14/19 0700  BP:  136/75 124/79 124/71  Pulse:  84 83 87  Resp:  (!) 21 17 (!) 24  Temp: 98.1 F (36.7 C)     TempSrc: Oral     SpO2:  93% 93% 90%  Weight:      Height:        Intake/Output Summary (Last 24 hours) at 06/14/2019 7425 Last data filed at 06/14/2019 0630 Gross per 24 hour  Intake 2387.73 ml  Output 1000 ml  Net 1387.73 ml   Filed Weights   06/09/19 0600 06/10/19 0500  Weight: 81.5 kg 82 kg    Physical Exam:  General: A/O x2 (with prompting will state he is in the hospital), acute on chronic respiratory distress (baseline 2 L O2 via [place]) Eyes: negative scleral hemorrhage, negative anisocoria, negative icterus ENT: Negative Runny nose, negative  gingival bleeding, Neck:  Negative scars, masses, torticollis, lymphadenopathy, JVD Lungs: Diffuse poor air movement without wheezes or crackles Cardiovascular: Regular rate and rhythm without murmur gallop or rub normal S1 and S2 Abdomen: negative abdominal pain, nondistended, positive soft, bowel sounds, no rebound, no ascites, no appreciable mass Extremities: No significant cyanosis, clubbing, or edema bilateral lower extremities Skin: Negative rashes, lesions, ulcers Psychiatric:  Negative depression, negative anxiety, negative fatigue, negative mania  Central nervous system:  Cranial nerves II through XII intact, tongue/uvula midline, all extremities muscle strength 3/5, sensation intact throughout,negative dysarthria, negative expressive aphasia, negative receptive aphasia.      Data Reviewed: Care during the described time interval was provided by me .  I have reviewed this patient's available data, including medical history, events of note, physical examination, and all test results as part of  my evaluation.   CBC: Recent Labs  Lab 06/09/19 0535 06/10/19 0550 06/10/19 0624 06/11/19 0545 06/12/19 0542 06/12/19 1042 06/13/19 0529  WBC 9.3 9.6  --  11.0* 13.2*  --  13.5*  NEUTROABS 8.2* 8.2*  --  9.4* 11.3*  --  11.4*  HGB 10.4* 9.9* 10.2* 9.5* 9.7* 9.9* 10.4*  HCT 36.9* 34.7* 30.0* 33.9* 34.4* 29.0* 35.6*  MCV 97.4 96.4  --  98.0 98.0  --  95.4  PLT 196 155  --  151 154  --  734   Basic Metabolic Panel: Recent Labs  Lab 06/09/19 0535 06/09/19 1650 06/10/19 0550 06/10/19 0624 06/11/19 0545 06/12/19 0542 06/12/19 1042 06/13/19 0529  NA 150*  --  154* 152* 159* 153* 150* 148*  K 4.0  --  3.6 3.5 4.0 3.6 3.8 3.0*  CL 109  --  114*  --  118* 112*  --  103  CO2 29  --  27  --  33* 32  --  38*  GLUCOSE 239*  --  165*  --  161* 149*  --  134*  BUN 44*  --  48*  --  44* 31*  --  26*  CREATININE 2.07*  --  1.72*  --  1.31* 1.06  --  1.09  CALCIUM 8.0*  --  8.4*  --  8.0* 7.9*  --  8.1*  MG 2.3 2.3 2.4  --  2.5* 2.2  --  2.0  PHOS 2.8 2.6 2.5  --  3.9 2.9  --  2.3*   GFR: Estimated Creatinine Clearance: 50.6 mL/min (by C-G formula based on SCr of 1.09 mg/dL). Liver Function Tests: Recent Labs  Lab 06/09/19 0535 06/10/19 0550 06/11/19 0545 06/12/19 0542 06/13/19 0529  AST 30 28 25 29  35  ALT 25 24 24 25 28   ALKPHOS 61 56 51 62 63  BILITOT 0.9 0.8 0.4 0.3 0.4  PROT 6.2* 6.1* 5.6* 5.7* 5.7*  ALBUMIN 2.8* 2.8* 2.6* 2.6* 2.7*   No results for input(s): LIPASE, AMYLASE in the last 168 hours. Recent Labs  Lab 06/09/19 0535  AMMONIA 22   Coagulation Profile: No results for input(s): INR, PROTIME in the last 168 hours. Cardiac Enzymes: No results for input(s): CKTOTAL, CKMB, CKMBINDEX, TROPONINI in the last 168 hours. BNP (last 3 results) No results for input(s): PROBNP in the last 8760 hours. HbA1C: No results for input(s): HGBA1C in the last 72 hours. CBG: Recent Labs  Lab 06/10/19 0011 06/10/19 0351 06/10/19 0819 06/10/19 1232 06/13/19 1559   GLUCAP 161* 147* 136* 156* 187*   Lipid Profile: Recent Labs    06/12/19 0542  TRIG 105  Thyroid Function Tests: No results for input(s): TSH, T4TOTAL, FREET4, T3FREE, THYROIDAB in the last 72 hours. Anemia Panel: Recent Labs    06/12/19 0542 06/13/19 0529  FERRITIN 208 176   Urine analysis:    Component Value Date/Time   COLORURINE AMBER (A) 05/14/2019 0000   APPEARANCEUR CLOUDY (A) 05/14/2019 0000   LABSPEC 1.023 05/14/2019 0000   PHURINE 5.0 05/14/2019 0000   GLUCOSEU NEGATIVE 05/14/2019 0000   HGBUR LARGE (A) 05/14/2019 0000   BILIRUBINUR NEGATIVE 05/14/2019 0000   KETONESUR NEGATIVE 05/14/2019 0000   PROTEINUR 100 (A) 05/14/2019 0000   UROBILINOGEN 0.2 06/10/2015 1430   NITRITE NEGATIVE 05/14/2019 0000   LEUKOCYTESUR NEGATIVE 05/14/2019 0000   Sepsis Labs: @LABRCNTIP (procalcitonin:4,lacticidven:4)  ) Recent Results (from the past 240 hour(s))  MRSA PCR Screening     Status: Abnormal   Collection Time: 06/10/19 12:59 PM   Specimen: Nasal Mucosa; Nasopharyngeal  Result Value Ref Range Status   MRSA by PCR POSITIVE (A) NEGATIVE Final    Comment:        The GeneXpert MRSA Assay (FDA approved for NASAL specimens only), is one component of a comprehensive MRSA colonization surveillance program. It is not intended to diagnose MRSA infection nor to guide or monitor treatment for MRSA infections. RESULT CALLED TO, READ BACK BY AND VERIFIED WITH: B.RUDD,RN [id number] @0138  BY V.WILKINS Performed at [facility] [address] [place], [place] [address]          Radiology Studies: No results found.      Scheduled Meds: . aspirin  81 mg Oral Daily  . atorvastatin  80 mg Oral q1800  . budesonide  1 puff Inhalation BID  . Chlorhexidine Gluconate Cloth  6 each Topical Daily  . dexamethasone (DECADRON) injection  6 mg Intravenous Q24H  . enoxaparin (LOVENOX) injection  40 mg Subcutaneous Q12H  . escitalopram  20 mg Per Tube Daily   . feeding supplement (ENSURE ENLIVE)  237 mL Oral BID BM  . Ipratropium-Albuterol  1 puff Inhalation Q6H  . mouth rinse  15 mL Mouth Rinse BID  . montelukast  10 mg Oral QHS  . multivitamin with minerals  1 tablet Oral Daily  . mupirocin ointment  1 application Nasal BID  . pantoprazole  40 mg Oral Daily  . [START ON 06/15/2019] pneumococcal 23 valent vaccine  0.5 mL Intramuscular Tomorrow-1000  . tamsulosin  0.4 mg Oral Daily  . vitamin C  500 mg Oral Daily  . zinc sulfate  220 mg Oral Daily   Continuous Infusions: . ampicillin (OMNIPEN) IV 2 g (06/13/19 2340)  . dextrose 50 mL/hr at 06/14/19 0110  . feeding supplement (VITAL AF 1.2 CAL)       LOS: 5 days   The patient is critically ill with multiple organ systems failure and requires high complexity decision making for assessment and support, frequent evaluation and titration of therapies, application of advanced monitoring technologies and extensive interpretation of multiple databases. Critical Care Time devoted to patient care services described in this note  Time spent: 40 minutes     , Geraldo Docker, MD [facility] Pager [phone]  If 7PM-7AM, please contact night-coverage [email or web address] Password [email or web address] 06/14/2019, 7:22 AM

## 2019-06-14 NOTE — Progress Notes (Signed)
Called patient's daughter, [name] and gave updates on patient and patient condition. Told her that we were transferring patient to PCU this morning. [name] said she would call later to check  on patient

## 2019-06-14 NOTE — Progress Notes (Signed)
  Speech Language Pathology Treatment: Dysphagia  Patient Details Name: [name] MRN: [id number] DOB: [birth year] Today's Date: 06/14/2019 Time: 1572-6203 SLP Time Calculation (min) (ACUTE ONLY): 21 min  Assessment / Plan / Recommendation Clinical Impression  Pt transferred out of the ICU this am.  He is alert; cognition marked by limited spontaneous communication, delayed response time to questions; delays in verbal and motor output. Pt accepted multiple boluses of thin liquid from a straw with no overt s/s of aspiration.  His intake of solids has been limited.  When offered a [employer] oatmeal cookie that his daughter had sent him, he became more animated, smiled, and consumed 1/4 of the cookie with slow but functional mastication despite absence of teeth. Pt required verbal cues to persist through self-feeding given tendency to cease activity due to cognitive deficits.  After eating/drinking for a few minutes, he began belching, coughing minimally and expectorating mucous/phlegm. He declined further POs.  Pt's voice continues with low volume and poor quality phonation - by the end of session, vocal quality and loudness improved marginally.  Pt appears to be protecting his airway; may have esophageal issues contributing to presentation today.  Recommend advancing diet to dysphagia 2, thin liquids; continue meds whole in puree.  Assist with tray set-up and feeding as needed.  SLP will follow to address safety/diet progression.   HPI HPI: 84 y/o male admitted from [facility] on 10/15 after being intubated for mechanical ventilation in setting of ARDS from COVID 19. Intubated 10/14-10/16. At baseline lives in a nursing home, has multiple comorbid illnesses. Hx: anemia, angina, on anticoagulant, atherosclerosis, CAD, HTN, HLD, COPD, CHF EF 35-40%, CKD, MI, prostate cancer ICD (06/13).       SLP Plan  Continue with current plan of care       Recommendations  Diet recommendations: Dysphagia 2  (fine chop);Thin liquid Liquids provided via: Cup;Straw Medication Administration: Whole meds with puree Supervision: Staff to assist with self feeding Compensations: Slow rate;Small sips/bites Postural Changes and/or Swallow Maneuvers: Seated upright 90 degrees                Oral Care Recommendations: Oral care BID Follow up Recommendations: Skilled Nursing facility SLP Visit Diagnosis: Dysphagia, unspecified (R13.10) Plan: Continue with current plan of care       Badin. Tivis Ringer, MA CCC/SLP Acute Rehabilitation Services Office number [phone]    Juan Quam Laurice 06/14/2019, 1:00 PM

## 2019-06-14 NOTE — Progress Notes (Signed)
Pt refusing lab draw.  IV team consult put in for IV access and blood draw.

## 2019-06-15 LAB — COMPREHENSIVE METABOLIC PANEL
ALT: 28 U/L (ref 0–44)
AST: 31 U/L (ref 15–41)
Albumin: 2.3 g/dL — ABNORMAL LOW (ref 3.5–5.0)
Alkaline Phosphatase: 57 U/L (ref 38–126)
Anion gap: 9 (ref 5–15)
BUN: 23 mg/dL (ref 8–23)
CO2: 31 mmol/L (ref 22–32)
Calcium: 7.6 mg/dL — ABNORMAL LOW (ref 8.9–10.3)
Chloride: 103 mmol/L (ref 98–111)
Creatinine, Ser: 1.03 mg/dL (ref 0.61–1.24)
GFR calc Af Amer: >60 mL/min (ref >60)
GFR calc non Af Amer: >60 mL/min (ref >60)
Glucose, Bld: 132 mg/dL — ABNORMAL HIGH (ref 70–99)
Potassium: 3.6 mmol/L (ref 3.5–5.1)
Sodium: 143 mmol/L (ref 135–145)
Total Bilirubin: 0.4 mg/dL (ref 0.3–1.2)
Total Protein: 5.3 g/dL — ABNORMAL LOW (ref 6.5–8.1)

## 2019-06-15 LAB — MAGNESIUM: Magnesium: 2.1 mg/dL (ref 1.7–2.4)

## 2019-06-15 LAB — PHOSPHORUS: Phosphorus: 3.7 mg/dL (ref 2.5–4.6)

## 2019-06-15 LAB — C-REACTIVE PROTEIN: CRP: 1 mg/dL — ABNORMAL HIGH (ref <1.0)

## 2019-06-15 MED ORDER — ENOXAPARIN SODIUM 40 MG/0.4ML ~~LOC~~ SOLN
40.0000 mg | SUBCUTANEOUS | Status: DC
  Administered 2019-06-15 – 2019-06-18 (×4): 40 mg via SUBCUTANEOUS
  Filled 2019-06-15 (×4): qty 0.4

## 2019-06-15 NOTE — Plan of Care (Signed)
Patient confused. No family present.

## 2019-06-15 NOTE — Progress Notes (Signed)
PROGRESS NOTE    [name]  AST:[id number] DOB: 1934/09/01 DOA: 06/09/2019 PCP: Redmond School, MD   Brief Narrative:   84 y.o. WM PMHx  anemia,  asthma/COPD liters O2 at [facility], history MI, history of post MI ventricular thrombus (, anticoagulated on warfarin) chronic systolic heart failure, HTN, s/p ICD placement, CKD stage III, GERD,  prostate cancer,admitted from [facility] on 10/15 after being intubated for mechanical ventilation in setting of ARDS from COVID 19.At baseline lives in a nursing home, has multiple comorbid illnesses.    10/14 intubated [facility] 10/15 admitted [place] 10/16 extubated 10/17: stable on HFNC, not very interactive  Subjective:   No significant events overnight, denies nausea, vomiting or abdominal pain, he reports poor appetite.   Assessment & Plan:   Principal Problem:   Acute respiratory distress syndrome (ARDS) due to COVID-19 virus [facility]) Active Problems:   Essential hypertension   Coronary atherosclerosis   Chronic systolic congestive heart failure   CKD (chronic kidney disease) stage 3, GFR 30-59 ml/min (HCC)   COPD (chronic obstructive pulmonary disease) (HCC)   AKI (acute kidney injury) ([place])   Urinary tract infection   Acute respiratory failure with hypoxia (HCC)   Pneumonia due to COVID-19 virus   Acute renal failure superimposed on stage 3b chronic kidney disease (HCC)   Enterococcus UTI   Hypernatremia  Acute on chronic respiratory failure with hypoxia/Covid pneumonia -Patient presents intubated from [facility], successfully extubated 10/16. -Patient currently on 3 L nasal cannula, but overall poor inspiratory effort, I have discussed with patient today, encouraged use incentive spirometry, discussed with staff, encouraged out of bed to chair with assistance -Continue with IV steroids -Treated with IV remdesivir -Combivent QID -Flutter valve -10/16 extubated -10/20 passed swallow evaluation dysphagia 2 thin liquid   COVID-19 Labs  Recent Labs    06/13/19 0529 06/14/19 0550 06/14/19 1240 06/15/19 0455  DDIMER 0.61*  --  0.62*  --   FERRITIN 176 141  --   --   CRP <0.8 <0.8  --  1.0*    Lab Results  Component Value Date   [place] NEGATIVE 03/22/2019   ARDS -See Covid pneumonia  COPD -See Covid pneumonia  Acute on CKD stage III (baseline Cr ~1.7) Recent Labs  Lab 06/11/19 0545 06/12/19 0542 06/13/19 0529 06/14/19 0550 06/15/19 0455  CREATININE 1.31* 1.06 1.09 1.03 1.03  -10/20 decrease D5W 30 ml/hr   Enterococcus UTI (pansensitive) -Complete 7-day course antibiotics  Chronic systolic CHF -98/92/1194 EF 35 to 40% with diffuse hypokinesis -06/12/2019 echocardiogram shows worsening cardiac function EF 20 to 25% see results below -Strict in and out , so far remains positive balance during hospital stay, but no significant change in fluid status over last 24 hours. -Daily weight Filed Weights   06/09/19 0600 06/10/19 0500 06/15/19 0304  Weight: 81.5 kg 82 kg 78.5 kg  -Transfuse for hemoglobin<7  Essential HTN -Was on Levophed, now DC'd -Currently controlled without BP medication monitor closely  CAD -Aspirin 81 mg daily,  -Lipitor 80 mg daily  Hypernatremia -Secondary to dehydration -Continue slow hydration; D5W 28ml/hr -Monitor closely for fluid overload given his decreased cardiac function   Hypokalemia -Potassium goal> 4 -K-Phos 15 mmol  Hypophosphatemia -See hypokalemia    Goals of care -10/16 PT/OT consult placed; recommend SNF -10/18 Dr. Marchelle Gearing speaking with family concerning their goals of care, given that patient's respiratory status poor and patient appears to be have worsening status..      DVT prophylaxis: Subcu Lovenox  Code Status: Full Family Communication: Discussed with patient Disposition Plan: TBD   Consultants:  PCCM   Procedures/Significant Events:  10/18 PCXR;Slight worsening of atelectasis at the lung bases  following Extubation 10/18 echocardiogram;Left Ventricle: EF= 20 to 25%. The left ventricle has severely decreased function.  -Left ventricular diastolic Doppler parameters are consistent with impaired relaxation pattern -Left Atrium: moderately dilated. -Additional Comments: A pacer wire is visualized.   I    Cultures 10/16 MRSA by PCR positive Urine positive Enterococcus??  Unable to locate culture.  Prior to admission?   Antimicrobials: Anti-infectives (From admission, onward)   Start     Stop   06/12/19 2000  ampicillin (OMNIPEN) 2 g in sodium chloride 0.9 % 100 mL IVPB     06/15/19 2359   06/11/19 0900  ampicillin (OMNIPEN) 2 g in sodium chloride 0.9 % 100 mL IVPB  Status:  Discontinued     06/12/19 1502   06/10/19 1000  remdesivir 100 mg in sodium chloride 0.9 % 250 mL IVPB     06/13/19 0951   06/09/19 1400  ampicillin (OMNIPEN) 2 g in sodium chloride 0.9 % 100 mL IVPB  Status:  Discontinued     06/09/19 1209   06/09/19 1000  remdesivir 200 mg in sodium chloride 0.9 % 250 mL IVPB     06/09/19 1127   06/09/19 0800  levofloxacin (LEVAQUIN) IVPB 750 mg  Status:  Discontinued     06/09/19 1206        Continuous Infusions: . ampicillin (OMNIPEN) IV 2 g (06/15/19 1234)  . dextrose 30 mL/hr at 06/15/19 0249  . feeding supplement (VITAL AF 1.2 CAL)       Objective: Vitals:   06/14/19 2300 06/15/19 0304 06/15/19 0800 06/15/19 1114  BP: 117/78 125/83 129/79 117/87  Pulse: 86 90    Resp: 16 15 17 16   Temp: 98.4 F (36.9 C) 98.2 F (36.8 C)  98.6 F (37 C)  TempSrc: Oral Oral  Axillary  SpO2: 93% 93% 90% 95%  Weight:  78.5 kg    Height:        Intake/Output Summary (Last 24 hours) at 06/15/2019 1605 Last data filed at 06/15/2019 1303 Gross per 24 hour  Intake 923.83 ml  Output 1050 ml  Net -126.17 ml   Filed Weights   06/09/19 0600 06/10/19 0500 06/15/19 0304  Weight: 81.5 kg 82 kg 78.5 kg    Physical Exam:  Awake Alert, communicative, answering  questions appropriately, but extremely frail, and debilitated. Symmetrical Chest wall movement, diminished air entry, no wheezing RRR,No Gallops,Rubs or new Murmurs, No Parasternal Heave +ve B.Sounds, Abd Soft, No tenderness, No rebound - guarding or rigidity. No Cyanosis, Clubbing or edema, No new Rash or bruise      CBC: Recent Labs  Lab 06/10/19 0550  06/11/19 0545 06/12/19 0542 06/12/19 1042 06/13/19 0529 06/14/19 1240  WBC 9.6  --  11.0* 13.2*  --  13.5* 10.7*  NEUTROABS 8.2*  --  9.4* 11.3*  --  11.4* 9.0*  HGB 9.9*   < > 9.5* 9.7* 9.9* 10.4* 9.8*  HCT 34.7*   < > 33.9* 34.4* 29.0* 35.6* 32.4*  MCV 96.4  --  98.0 98.0  --  95.4 92.6  PLT 155  --  151 154  --  171 153   < > = values in this interval not displayed.   Basic Metabolic Panel: Recent Labs  Lab 06/11/19 0545 06/12/19 0542 06/12/19 1042 06/13/19 0529 06/14/19 0550 06/15/19 0455  NA 159* 153* 150* 148* 145 143  K 4.0 3.6 3.8 3.0* 3.9 3.6  CL 118* 112*  --  103 104 103  CO2 33* 32  --  38* 33* 31  GLUCOSE 161* 149*  --  134* 106* 132*  BUN 44* 31*  --  26* 26* 23  CREATININE 1.31* 1.06  --  1.09 1.03 1.03  CALCIUM 8.0* 7.9*  --  8.1* 7.7* 7.6*  MG 2.5* 2.2  --  2.0 2.1 2.1  PHOS 3.9 2.9  --  2.3* 1.7* 3.7   GFR: Estimated Creatinine Clearance: 53.5 mL/min (by C-G formula based on SCr of 1.03 mg/dL). Liver Function Tests: Recent Labs  Lab 06/11/19 0545 06/12/19 0542 06/13/19 0529 06/14/19 0550 06/15/19 0455  AST 25 29 35 51* 31  ALT 24 25 28  32 28  ALKPHOS 51 62 63 58 57  BILITOT 0.4 0.3 0.4 1.1 0.4  PROT 5.6* 5.7* 5.7* 5.3* 5.3*  ALBUMIN 2.6* 2.6* 2.7* 2.4* 2.3*   No results for input(s): LIPASE, AMYLASE in the last 168 hours. Recent Labs  Lab 06/09/19 0535  AMMONIA 22   Coagulation Profile: No results for input(s): INR, PROTIME in the last 168 hours. Cardiac Enzymes: No results for input(s): CKTOTAL, CKMB, CKMBINDEX, TROPONINI in the last 168 hours. BNP (last 3 results) No results  for input(s): PROBNP in the last 8760 hours. HbA1C: No results for input(s): HGBA1C in the last 72 hours. CBG: Recent Labs  Lab 06/10/19 0011 06/10/19 0351 06/10/19 0819 06/10/19 1232 06/13/19 1559  GLUCAP 161* 147* 136* 156* 187*   Lipid Profile: No results for input(s): CHOL, HDL, LDLCALC, TRIG, CHOLHDL, LDLDIRECT in the last 72 hours. Thyroid Function Tests: No results for input(s): TSH, T4TOTAL, FREET4, T3FREE, THYROIDAB in the last 72 hours. Anemia Panel: Recent Labs    06/13/19 0529 06/14/19 0550  FERRITIN 176 141   Urine analysis:    Component Value Date/Time   COLORURINE AMBER (A) 05/14/2019 0000   APPEARANCEUR CLOUDY (A) 05/14/2019 0000   LABSPEC 1.023 05/14/2019 0000   PHURINE 5.0 05/14/2019 0000   GLUCOSEU NEGATIVE 05/14/2019 0000   HGBUR LARGE (A) 05/14/2019 0000   BILIRUBINUR NEGATIVE 05/14/2019 0000   KETONESUR NEGATIVE 05/14/2019 0000   PROTEINUR 100 (A) 05/14/2019 0000   UROBILINOGEN 0.2 06/10/2015 1430   NITRITE NEGATIVE 05/14/2019 0000   LEUKOCYTESUR NEGATIVE 05/14/2019 0000   Sepsis Labs: @LABRCNTIP (procalcitonin:4,lacticidven:4)  ) Recent Results (from the past 240 hour(s))  MRSA PCR Screening     Status: Abnormal   Collection Time: 06/10/19 12:59 PM   Specimen: Nasal Mucosa; Nasopharyngeal  Result Value Ref Range Status   MRSA by PCR POSITIVE (A) NEGATIVE Final    Comment:        The GeneXpert MRSA Assay (FDA approved for NASAL specimens only), is one component of a comprehensive MRSA colonization surveillance program. It is not intended to diagnose MRSA infection nor to guide or monitor treatment for MRSA infections. RESULT CALLED TO, READ BACK BY AND VERIFIED WITH: B.RUDD,RN [id number] @0138  BY V.WILKINS Performed at [facility], [place] [address] [place], [place] [address]          Radiology Studies: No results found.      Scheduled Meds: . aspirin  81 mg Oral Daily  . atorvastatin  80 mg Oral q1800   . budesonide  1 puff Inhalation BID  . Chlorhexidine Gluconate Cloth  6 each Topical Daily  . dexamethasone (DECADRON) injection  6 mg Intravenous Q24H  .  enoxaparin (LOVENOX) injection  40 mg Subcutaneous Q24H  . escitalopram  20 mg Per Tube Daily  . feeding supplement (ENSURE ENLIVE)  237 mL Oral BID BM  . Ipratropium-Albuterol  1 puff Inhalation Q6H  . mouth rinse  15 mL Mouth Rinse BID  . montelukast  10 mg Oral QHS  . multivitamin with minerals  1 tablet Oral Daily  . pantoprazole  40 mg Oral Daily  . sodium chloride flush  10-40 mL Intracatheter Q12H  . tamsulosin  0.4 mg Oral Daily  . vitamin C  500 mg Oral Daily  . zinc sulfate  220 mg Oral Daily   Continuous Infusions: . ampicillin (OMNIPEN) IV 2 g (06/15/19 1234)  . dextrose 30 mL/hr at 06/15/19 0249  . feeding supplement (VITAL AF 1.2 CAL)       LOS: 6 days      Phillips Climes, MD [facility]  If 7PM-7AM, please contact night-coverage [email or web address] Password [email or web address] 06/15/2019, 4:05 PM

## 2019-06-15 NOTE — Progress Notes (Signed)
Wife called and requested updates.  Provided updates and allowed her to speak with patient on phone.

## 2019-06-15 NOTE — Plan of Care (Signed)
  Problem: Education: Goal: Knowledge of risk factors and measures for prevention of condition will improve 06/15/2019 1556 by Christella Hartigan, RN Outcome: Progressing 06/15/2019 1555 by Christella Hartigan, RN Outcome: Progressing   Problem: Coping: Goal: Psychosocial and spiritual needs will be supported 06/15/2019 1556 by Christella Hartigan, RN Outcome: Progressing 06/15/2019 1555 by Christella Hartigan, RN Outcome: Progressing   Problem: Respiratory: Goal: Will maintain a patent airway 06/15/2019 1556 by Christella Hartigan, RN Outcome: Progressing 06/15/2019 1555 by Christella Hartigan, RN Outcome: Progressing Goal: Complications related to the disease process, condition or treatment will be avoided or minimized 06/15/2019 1556 by Christella Hartigan, RN Outcome: Progressing 06/15/2019 1555 by Christella Hartigan, RN Outcome: Progressing

## 2019-06-15 NOTE — Plan of Care (Signed)
Pt slept well during the night. Alet, oriented to self only. No complaints of pain verbalized. Alert and oriented. Vitals stable on O2 weaned to 3L [place]. Dry cough noted, prn Robitussin given. Full assist with ADLs. Incontinent of stools. Foley catheter draining wel. CHG bath done.  Skin assessed, MASD on sacral/perianal area, barrier cream applied, sacral foam dressing changed. Assisted with regular repositioning for pressure relief. Unable to tolerate prone positioning. Maintenance IV fluids continued. No other issues, will monitor.   Problem: Education: Goal: Knowledge of risk factors and measures for prevention of condition will improve Outcome: Progressing   Problem: Coping: Goal: Psychosocial and spiritual needs will be supported Outcome: Progressing   Problem: Respiratory: Goal: Will maintain a patent airway Outcome: Progressing Goal: Complications related to the disease process, condition or treatment will be avoided or minimized Outcome: Progressing

## 2019-06-15 NOTE — Progress Notes (Signed)
  Speech Language Pathology Treatment: Dysphagia  Patient Details Name: [name] MRN: [id number] DOB: 02/05/1935 Today's Date: 06/15/2019 Time: 7943-2761 SLP Time Calculation (min) (ACUTE ONLY): 15 min  Assessment / Plan / Recommendation Clinical Impression  Pt more lethargic today; breakfast tray minimally touched at bedside.  Despite encouragement to awaken, pt difficult to arouse.  He consumed limited sips of water and bites of oatmeal cookie at bedside.  Pt required intermittent verbal cues to sustain chewing and initiate a swallow response - responses are more delayed than they were yesterday, but there were no overt s/s of aspiration.  Recommend continue dysphagia 2 diet, thin liquids; provide full supervision and assist with feeding as necessary.  SLP will follow for safety/diet progression.   HPI HPI: 84 y/o male admitted from [facility] on 10/15 after being intubated for mechanical ventilation in setting of ARDS from COVID 19. Intubated 10/14-10/16. At baseline lives in a nursing home, has multiple comorbid illnesses. Hx: anemia, angina, on anticoagulant, atherosclerosis, CAD, HTN, HLD, COPD, CHF EF 35-40%, CKD, MI, prostate cancer ICD (06/13).       SLP Plan  Continue with current plan of care       Recommendations  Diet recommendations: Dysphagia 2 (fine chop) Liquids provided via: Cup;Straw Medication Administration: Whole meds with puree Supervision: Staff to assist with self feeding Compensations: Slow rate;Small sips/bites;Minimize environmental distractions Postural Changes and/or Swallow Maneuvers: Seated upright 90 degrees                Oral Care Recommendations: Oral care BID Follow up Recommendations: Skilled Nursing facility SLP Visit Diagnosis: Dysphagia, unspecified (R13.10) Plan: Continue with current plan of care       GO              Mairead Schwarzkopf L. Tivis Ringer, [place] CCC/SLP Acute Rehabilitation Services Office number [phone]    Juan Quam  Laurice 06/15/2019, 11:03 AM

## 2019-06-15 NOTE — TOC Initial Note (Signed)
Transition of Care [facility]) - Initial/Assessment Note    Patient Details  Name: [name] MRN: [id number] Date of Birth: 1934-12-23  Transition of Care [facility]) CM/SW Contact:    Ninfa Meeker, RN Phone Number: 06/15/2019, 2:31 PM  Clinical Narrative: Patient is a 84 y.o. male with PMHx. ofanemia, asthma/COPDon 2L [place], CAD, history MI, history of post MI ventricular thrombus chronic systolic heart failure, HTN, other illness. Came from ED at [place] is resident at [facility], and they have an [place] 19. Was admitted at [facility] 9/22-10/3. Diagnosised with COVID 19. Patient is declining and MD is speaking with family concerning goals of care. CM will continue to follow.         Patient Goals and CMS Choice        Expected Discharge Plan and Services                                                Prior Living Arrangements/Services                       Activities of Daily Living Home Assistive Devices/Equipment: Oxygen, Grab bars around toilet, Grab bars in shower, Dentures (specify type), Eyeglasses, CBG Meter, Blood pressure cuff, Walker (specify type), Shower chair with back ADL Screening (condition at time of admission) Patient's cognitive ability adequate to safely complete daily activities?: No Is the patient deaf or have difficulty hearing?: No Does the patient have difficulty seeing, even when wearing glasses/contacts?: No Does the patient have difficulty concentrating, remembering, or making decisions?: Yes Patient able to express need for assistance with ADLs?: Yes Does the patient have difficulty dressing or bathing?: Yes Independently performs ADLs?: No Communication: Needs assistance Is this a change from baseline?: Pre-admission baseline Dressing (OT): Dependent Is this a change from baseline?: Pre-admission baseline Grooming: Dependent Is this a change from baseline?: Pre-admission  baseline Feeding: Needs assistance Is this a change from baseline?: Pre-admission baseline Bathing: Dependent Is this a change from baseline?: Pre-admission baseline Toileting: Dependent Is this a change from baseline?: Pre-admission baseline In/Out Bed: Needs assistance Is this a change from baseline?: Pre-admission baseline Walks in Home: Dependent Is this a change from baseline?: Pre-admission baseline Does the patient have difficulty walking or climbing stairs?: Yes Weakness of Legs: Both Weakness of Arms/Hands: Both  Permission Sought/Granted                  Emotional Assessment              Admission diagnosis:  COVID-19 VIRUS INFECTION RESPIRATORY FAILURE  UROSEPSIS  Patient Active Problem List   Diagnosis Date Noted  . Hypernatremia 06/11/2019  . Acute respiratory distress syndrome (ARDS) due to COVID-19 virus ([place]) 06/09/2019  . AKI (acute kidney injury) ([place]) 06/09/2019  . Urinary tract infection 06/09/2019  . Acute respiratory failure with hypoxia ([place]) 06/09/2019  . Pneumonia due to COVID-19 virus 06/09/2019  . Acute renal failure superimposed on stage 3b chronic kidney disease ([place]) 06/09/2019  . Enterococcus UTI 06/09/2019  . Lower GI bleed   . Rectal bleeding 03/22/2019  . Acute hypoxemic respiratory failure ([place]) 11/03/2018  . Acute metabolic encephalopathy 90/24/0973  . Acute lower UTI 11/03/2018  . Cardiomyopathy, ischemic 07/15/2015  . Chronic obstructive pulmonary disease ([place])   . Urinary incontinence due to urethral  sphincter incompetence   . Viral bronchitis 06/11/2015  . Obesity 05/31/2015  . Chronic hypoxemic respiratory failure ([place]) 05/31/2015  . COPD exacerbation ([place]) 05/30/2015  . Chronic respiratory failure ([place]) 11/23/2014  . S/P MDT ICD  6.17.13, with revision 02/10/12 for lead dislodgement 02/10/2012  . Atherosclerosis of native arteries of the extremities with intermittent claudication 08/13/2011  . Anticoagulated on  warfarin 07/17/2011  . Left Ventricular thrombus s/p Anterior STEMI Oct 2012, Rx'd with Coumadin 07/02/2011  . Recent Anterior ST elevation (STEMI) myocardial infarction involving LAD, 05/2011 06/29/2011    Class: Status post  . Chronic systolic congestive heart failure 06/29/2011    Class: Acute  . CKD (chronic kidney disease) stage 3, GFR 30-59 ml/min (HCC) 06/29/2011  . COPD (chronic obstructive pulmonary disease) ([place]) 06/29/2011    Class: Chronic  . Prostate cancer ([place]) 06/29/2011    Class: Chronic  . CARDIOMYOPATHY, ISCHEMIC 05/08/2009  . DYSLIPIDEMIA 04/23/2009  . Essential hypertension 04/23/2009  . Coronary atherosclerosis 04/23/2009   PCP:  Redmond School, MD Pharmacy:   [facility], [place] [address] [place] [address] Phone: [phone] Fax: [phone]     Social Determinants of Health (SDOH) Interventions    Readmission Risk Interventions Readmission Risk Prevention Plan 03/23/2019 11/04/2018  Transportation Screening Complete Complete  PCP or Specialist Appt within 3-5 Days Not Complete -  HRI or Home Care Consult Complete Patient refused  [place] or Home Care Consult comments - Patient states that he gets all of his services through the [place] and his daughter will coordinate that.   Social Work Consult for [place] Planning/Counseling Complete Not Complete  SW consult not completed comments - n/a  Palliative Care Screening Not Complete Not Applicable  Medication Review Press photographer) Complete Complete  Some recent data might be hidden

## 2019-06-15 NOTE — Progress Notes (Signed)
Pt is out of the ICU and d-dimer<5. Ok to change lovenox to 40mg  SQ qday per Dr. Waldron Labs.  Onnie Boer, PharmD, BCIDP, AAHIVP, CPP Infectious Disease Pharmacist 06/15/2019 8:17 AM

## 2019-06-15 NOTE — Consult Note (Addendum)
   [facility] CM Inpatient Consult   06/15/2019  [name] 09-25-1934 [id number]  [employer] MC:  Chart reviewed for Hx with [place] Management outreach.  Patient is a long term resident at [facility]. 06/16/2019 0930 Will need to confirm with inpatient [facility] staff if patient is a resident or was for rehab at [facility].  Follow up needed.  Needs will be met at the facility, if patient is LTC.  [name], RN BSN [facility] Liaison  [phone] business mobile phone Toll free office [phone]  Fax number: [phone] [name]

## 2019-06-15 NOTE — Plan of Care (Signed)
  Problem: Education: Goal: Knowledge of risk factors and measures for prevention of condition will improve Outcome: Progressing   Problem: Coping: Goal: Psychosocial and spiritual needs will be supported Outcome: Progressing   Problem: Respiratory: Goal: Will maintain a patent airway Outcome: Progressing Goal: Complications related to the disease process, condition or treatment will be avoided or minimized Outcome: Progressing   

## 2019-06-16 LAB — COMPREHENSIVE METABOLIC PANEL
ALT: 27 U/L (ref 0–44)
AST: 27 U/L (ref 15–41)
Albumin: 2.3 g/dL — ABNORMAL LOW (ref 3.5–5.0)
Alkaline Phosphatase: 62 U/L (ref 38–126)
Anion gap: 7 (ref 5–15)
BUN: 21 mg/dL (ref 8–23)
CO2: 31 mmol/L (ref 22–32)
Calcium: 7.8 mg/dL — ABNORMAL LOW (ref 8.9–10.3)
Chloride: 103 mmol/L (ref 98–111)
Creatinine, Ser: 1.11 mg/dL (ref 0.61–1.24)
GFR calc Af Amer: >60 mL/min (ref >60)
GFR calc non Af Amer: >60 mL/min (ref >60)
Glucose, Bld: 129 mg/dL — ABNORMAL HIGH (ref 70–99)
Potassium: 3.7 mmol/L (ref 3.5–5.1)
Sodium: 141 mmol/L (ref 135–145)
Total Bilirubin: 0.7 mg/dL (ref 0.3–1.2)
Total Protein: 5.3 g/dL — ABNORMAL LOW (ref 6.5–8.1)

## 2019-06-16 LAB — MAGNESIUM: Magnesium: 2 mg/dL (ref 1.7–2.4)

## 2019-06-16 LAB — CBC
HCT: 31 % — ABNORMAL LOW (ref 39.0–52.0)
Hemoglobin: 9.5 g/dL — ABNORMAL LOW (ref 13.0–17.0)
MCH: 28.3 pg (ref 26.0–34.0)
MCHC: 30.6 g/dL (ref 30.0–36.0)
MCV: 92.3 fL (ref 80.0–100.0)
Platelets: 172 10*3/uL (ref 150–400)
RBC: 3.36 MIL/uL — ABNORMAL LOW (ref 4.22–5.81)
RDW: 14.5 % (ref 11.5–15.5)
WBC: 9 10*3/uL (ref 4.0–10.5)
nRBC: 0 % (ref 0.0–0.2)

## 2019-06-16 LAB — PHOSPHORUS: Phosphorus: 2.9 mg/dL (ref 2.5–4.6)

## 2019-06-16 LAB — C-REACTIVE PROTEIN: CRP: 1 mg/dL — ABNORMAL HIGH (ref <1.0)

## 2019-06-16 MED ORDER — METOPROLOL TARTRATE 25 MG PO TABS
12.5000 mg | ORAL_TABLET | Freq: Two times a day (BID) | ORAL | Status: DC
  Administered 2019-06-16 – 2019-06-19 (×7): 12.5 mg via ORAL
  Filled 2019-06-16 (×7): qty 1

## 2019-06-16 NOTE — Progress Notes (Signed)
Spoke with daughter [name] this morning, updated pt on plan of care and answered her questions.

## 2019-06-16 NOTE — TOC Progression Note (Signed)
Transition of Care [facility]) - Progression Note    Patient Details  Name: [name] MRN: [id number] Date of Birth: 03/30/1935  Transition of [facility] CM/SW [place], LCSW Phone Number: 06/16/2019, 3:44 PM  Clinical Narrative:     CSW spoke with patients daughter, [name], regarding discharge plans- plans are for patient to return to [facility] [place] for SNF. Patient was previously there for short-term rehab using his VA benefits. CSW spoke with patients [place], Sonia Baller [phone], to make sure authorization was completed for return. Patient is all set to return to [place] once medically stable and has authorization from the [place].   [facility] [place] already scheduled transportation for Sunday 10/25 at 1PM in anticipation of discharge over the weekend. They are using [facility] transportation services [phone] ex. [phone] if transportation needed to be rescheduled.    Expected Discharge Plan: Skilled Nursing Facility Barriers to Discharge: Continued Medical Work up  Expected Discharge Plan and Services Expected Discharge Plan: [place]                                               Social Determinants of Health (SDOH) Interventions    Readmission Risk Interventions Readmission Risk Prevention Plan 03/23/2019 11/04/2018  Transportation Screening Complete Complete  PCP or Specialist Appt within 3-5 Days Not Complete -  HRI or Home Care Consult Complete Patient refused  [place] or Home Care Consult comments - Patient states that he gets all of his services through the [place] and his daughter will coordinate that.   Social Work Consult for [place] Planning/Counseling Complete Not Complete  SW consult not completed comments - n/a  Palliative Care Screening Not Complete Not Applicable  Medication Review Press photographer) Complete Complete  Some recent data might be hidden

## 2019-06-16 NOTE — Progress Notes (Signed)
Physical Therapy Treatment Patient Details Name: [name] MRN: [id number] DOB: [birth year] Today's Date: 06/16/2019    History of Present Illness 84 y/o male admitted from [facility] for ARDS from COVID PNA. Lives in SNF at baseline and has multi comorbidities. Hx: anemia, angina, on anticoagulant, atherosclerosis, CAD, HTN, HLD, COPD, CHF EF 35-40%, CKD, MI, prostate cancer ICD (06/13). was intubated until 10/16.    PT Comments    The patient is alert today and responds to questions, following  Instructions for use of IS and Flutter valve. Patient assisted to stand in STEDY to transfer to recliner. HR 129, SPO2 94% on 4 L [name]. Patient speaking to daughter on phone at end of session. Continue PT for mobility.  Follow Up Recommendations  SNF;Supervision/Assistance - 24 hour     Equipment Recommendations  None recommended by PT    Recommendations for Other Services       Precautions / Restrictions Precautions Precautions: Fall Precaution Comments: monitor VS    Mobility  Bed Mobility   Bed Mobility: Rolling;Sidelying to Sit Rolling: +2 for physical assistance;Max assist;Mod assist Sidelying to sit: Total assist;+2 for physical assistance;+2 for safety/equipment       General bed mobility comments: patient required assist for legs and trunk, patient performed a little moving legs.  Transfers Overall transfer level: Needs assistance   Transfers: Sit to/from Stand Sit to Stand: Max assist;+2 physical assistance;+2 safety/equipment;From elevated surface         General transfer comment: Steady assist to power up to stand in STEDY. Flaps down to sit while transferred to recliner. Stood again, using front bar to pull to stand with 2 assisting. Assist to descend to recliner.  Ambulation/Gait                 Stairs             Wheelchair Mobility    Modified Rankin (Stroke Patients Only)       Balance Overall balance assessment: Needs  assistance Sitting-balance support: Bilateral upper extremity supported;Feet supported Sitting balance-Leahy Scale: Poor Sitting balance - Comments: improved when he held STEDY bar.   Standing balance support: During functional activity;Bilateral upper extremity supported   Standing balance comment: standing inside STEDY briefly before sitting.                            Cognition Arousal/Alertness: Awake/alert Behavior During Therapy: Flat affect Overall Cognitive Status: Impaired/Different from baseline Area of Impairment: Orientation;Attention;Memory;Following commands;Awareness;Safety/judgement                 Orientation Level: Time;Situation;Place Current Attention Level: Selective Memory: Decreased short-term memory Following Commands: Follows one step commands consistently Safety/Judgement: Decreased awareness of safety;Decreased awareness of deficits Awareness: Intellectual   General Comments: multimodal cues to assist self to move legs to bed edge , patient did state daughter's name, how mant children, became more verbal.  required encouragement to stay sitting.      Exercises Other Exercises Other Exercises: IS and Flutter  valve instruction and performance.    General Comments        Pertinent Vitals/Pain Faces Pain Scale: Hurts a little bit Pain Location: generalized Pain Descriptors / Indicators: Grimacing Pain Intervention(s): Monitored during session    Home Living                      Prior Function  PT Goals (current goals can now be found in the care plan section) Progress towards PT goals: Progressing toward goals    Frequency    Min 2X/week      PT Plan Current plan remains appropriate    Co-evaluation PT/OT/SLP Co-Evaluation/Treatment: Yes Reason for Co-Treatment: For patient/therapist safety;To address functional/ADL transfers PT goals addressed during session: Mobility/safety with mobility         AM-PAC PT "6 Clicks" Mobility   Outcome Measure  Help needed turning from your back to your side while in a flat bed without using bedrails?: Total Help needed moving from lying on your back to sitting on the side of a flat bed without using bedrails?: Total Help needed moving to and from a bed to a chair (including a wheelchair)?: Total Help needed standing up from a chair using your arms (e.g., wheelchair or bedside chair)?: Total Help needed to walk in hospital room?: Total Help needed climbing 3-5 steps with a railing? : Total 6 Click Score: 6    End of Session Equipment Utilized During Treatment: Oxygen;Gait belt Activity Tolerance: Patient tolerated treatment well Patient left: in chair;with call bell/phone within reach;with chair alarm set Nurse Communication: Mobility status;Need for lift equipment PT Visit Diagnosis: Other abnormalities of gait and mobility (R26.89);Muscle weakness (generalized) (M62.81)     Time: 7026-3785 PT Time Calculation (min) (ACUTE ONLY): 28 min  Charges:  $Therapeutic Activity: 8-22 mins                     Tresa Endo PT Acute Rehabilitation Services Pager [phone] Office [phone]    Claretha Cooper 06/16/2019, 3:24 PM

## 2019-06-16 NOTE — Progress Notes (Signed)
Occupational Therapy Treatment Patient Details Name: [name] MRN: [id number] DOB: 1935/08/11 Today's Date: 06/16/2019    History of present illness 84 y/o male admitted from [facility] for ARDS from COVID PNA. Lives in SNF at baseline and has multi comorbidities. Hx: anemia, angina, on anticoagulant, atherosclerosis, CAD, HTN, HLD, COPD, CHF EF 35-40%, CKD, MI, prostate cancer ICD (06/13). was intubated until 10/16.   OT comments  Pt mobilized to chair with +2 Max A with use of Stedy. Once OOB in chair, pt more alert and feeding self. Daughter called and confirmed that pt was independent, living at home before his CVA in September. He was not a resident of [facility] but was there for rehab. VSS during session. Pt completed incentive spirometer x 10. Pt more interactive at end of session, talking about his 2 daughters and how he loves to watch birds. Will continue to follow acutely. Recommend OOB daily with nsg and SNF for rehab.   Follow Up Recommendations  SNF;Supervision/Assistance - 24 hour    Equipment Recommendations  Other (comment)    Recommendations for Other Services      Precautions / Restrictions Precautions Precautions: Fall Precaution Comments: monitor VS       Mobility Bed Mobility   Bed Mobility: Rolling;Sidelying to Sit Rolling: +2 for physical assistance;Max assist;Mod assist Sidelying to sit: Total assist;+2 for physical assistance;+2 for safety/equipment       General bed mobility comments: patient required assist for legs and trunk, patient performed a little moving legs.  Transfers Overall transfer level: Needs assistance   Transfers: Sit to/from Stand Sit to Stand: Max assist;+2 physical assistance;+2 safety/equipment;From elevated surface         General transfer comment: Steady assist to power up to stand in STEDY. Flaps down to sit while transferred to recliner. Stood again, using front bar to pull to stand with 2 assisting. Assist to descend  to recliner.    Balance Overall balance assessment: Needs assistance Sitting-balance support: Bilateral upper extremity supported;Feet supported Sitting balance-Leahy Scale: Poor Sitting balance - Comments: improved when he held STEDY bar.   Standing balance support: During functional activity;Bilateral upper extremity supported   Standing balance comment: standing inside STEDY briefly before sitting.                           ADL either performed or assessed with clinical judgement   ADL Overall ADL's : Needs assistance/impaired Eating/Feeding: Supervision/ safety;Set up Eating/Feeding Details (indicate cue type and reason): VC to increase PO intake; able to self feed Grooming: Sitting;Minimal assistance                               Functional mobility during ADLs: Maximal assistance;+2 for physical assistance General ADL Comments: Charlaine Dalton used for mobility     Vision       Perception     Praxis      Cognition Arousal/Alertness: Awake/alert Behavior During Therapy: Flat affect Overall Cognitive Status: Impaired/Different from baseline Area of Impairment: Orientation;Attention;Memory;Following commands;Awareness;Safety/judgement                 Orientation Level: Time;Situation;Place Current Attention Level: Selective Memory: Decreased short-term memory Following Commands: Follows one step commands consistently Safety/Judgement: Decreased awareness of safety;Decreased awareness of deficits Awareness: Intellectual   General Comments: multimodal cues to assist self to move legs to bed edge , patient did state daughter's name, how mant  children, became more verbal.  required encouragement to stay sitting.        Exercises Other Exercises Other Exercises: IS and Flutter  valve instruction and performance.   Shoulder Instructions       General Comments      Pertinent Vitals/ Pain       Pain Assessment: Faces Faces Pain Scale: Hurts  a little bit Pain Location: generalized Pain Descriptors / Indicators: Grimacing Pain Intervention(s): Monitored during session  Home Living                                          Prior Functioning/Environment              Frequency  Min 2X/week        Progress Toward Goals  OT Goals(current goals can now be found in the care plan section)  Progress towards OT goals: Progressing toward goals  Acute Rehab OT Goals Patient Stated Goal: to go home OT Goal Formulation: With patient/family Time For Goal Achievement: 06/27/19 Potential to Achieve Goals: Good ADL Goals Pt Will Perform Grooming: with min guard assist;sitting Pt Will Perform Upper Body Bathing: with min assist;sitting Pt Will Perform Upper Body Dressing: with min assist;sitting Additional ADL Goal #1: Pt will perform bed mobility with modA as precursor to EOB/OOB ADL. Additional ADL Goal #2: Pt will follow one step commands with 75% accuracy during functional task.  Plan Discharge plan remains appropriate    Co-evaluation    PT/OT/SLP Co-Evaluation/Treatment: Yes Reason for Co-Treatment: Complexity of the patient's impairments (multi-system involvement);To address functional/ADL transfers;For patient/therapist safety PT goals addressed during session: Mobility/safety with mobility OT goals addressed during session: ADL's and self-care;Strengthening/ROM      AM-PAC OT "6 Clicks" Daily Activity     Outcome Measure   Help from another person eating meals?: A Little Help from another person taking care of personal grooming?: A Little Help from another person toileting, which includes using toliet, bedpan, or urinal?: Total Help from another person bathing (including washing, rinsing, drying)?: A Lot Help from another person to put on and taking off regular upper body clothing?: A Lot Help from another person to put on and taking off regular lower body clothing?: Total 6 Click Score:  12    End of Session Equipment Utilized During Treatment: Oxygen;Gait belt  OT Visit Diagnosis: Muscle weakness (generalized) (M62.81);Other symptoms and signs involving cognitive function   Activity Tolerance Patient tolerated treatment well   Patient Left in chair;with call bell/phone within reach;with chair alarm set   Nurse Communication Mobility status;Need for lift equipment        Time: 1124-1155 OT Time Calculation (min): 31 min  Charges: OT General Charges $OT Visit: 1 Visit OT Treatments $Self Care/Home Management : 8-22 mins  Maurie Boettcher, OT/L   Acute OT Clinical Specialist [place] Pager [phone] Office [phone]    [facility] 06/16/2019, 3:38 PM

## 2019-06-16 NOTE — Progress Notes (Signed)
A FULL SET OF VITAL WERE NOT TAKEN, THEREFORE MEWS IS INVALID. BP 119/88(WNL) AND HR 102(SLIGHTLY TACHY). WILL CTM

## 2019-06-16 NOTE — Progress Notes (Signed)
PROGRESS NOTE    [name]  OYD:[id number] DOB: Aug 31, 1934 DOA: 06/09/2019 PCP: Redmond School, MD   Brief Narrative:   84 y.o. WM PMHx  anemia,  asthma/COPD liters O2 at [facility], history MI, history of post MI ventricular thrombus (, anticoagulated on warfarin) chronic systolic heart failure, HTN, s/p ICD placement, CKD stage III, GERD,  prostate cancer,admitted from [facility] on 10/15 after being intubated for mechanical ventilation in setting of ARDS from COVID 19.At baseline lives in a nursing home, has multiple comorbid illnesses.    10/14 intubated [facility] 10/15 admitted [place] 10/16 extubated 10/17: stable on HFNC, not very interactive  Subjective:   No significant events overnight, denies nausea, vomiting or abdominal pain, he reports generalized weakness.   Assessment & Plan:   Principal Problem:   Acute respiratory distress syndrome (ARDS) due to COVID-19 virus [facility]) Active Problems:   Essential hypertension   Coronary atherosclerosis   Chronic systolic congestive heart failure   CKD (chronic kidney disease) stage 3, GFR 30-59 ml/min (HCC)   COPD (chronic obstructive pulmonary disease) (HCC)   AKI (acute kidney injury) ([place])   Urinary tract infection   Acute respiratory failure with hypoxia (HCC)   Pneumonia due to COVID-19 virus   Acute renal failure superimposed on stage 3b chronic kidney disease (HCC)   Enterococcus UTI   Hypernatremia  Acute on chronic respiratory failure with hypoxia/Covid pneumonia -Patient presents intubated from [facility], successfully extubated 10/16. -Patient currently on 3 L nasal cannula, respiratory status remained stable over last 24 hours, but overall he has poor inspiratory effort, I have discussed with staff, they will able to get out of bed to chair today, and he will be encouraged to use incentive spirometry as well . -Continue with IV steroids, to finish total of 10 days -Treated with IV remdesivir -Combivent QID  -Flutter valve -10/16 extubated -10/20 passed swallow evaluation dysphagia 2 thin liquid  COVID-19 Labs  Recent Labs    06/14/19 0550 06/14/19 1240 06/15/19 0455 06/16/19 0425  DDIMER  --  0.62*  --   --   FERRITIN 141  --   --   --   CRP <0.8  --  1.0* 1.0*    Lab Results  Component Value Date   [place] NEGATIVE 03/22/2019   ARDS -See Covid pneumonia  COPD -See Covid pneumonia  Acute on CKD stage III (baseline Cr ~1.7) Recent Labs  Lab 06/12/19 0542 06/13/19 0529 06/14/19 0550 06/15/19 0455 06/16/19 0425  CREATININE 1.06 1.09 1.03 1.03 1.11  -10/20 decrease D5W 30 ml/hr   Enterococcus UTI (pansensitive) -Complete 7-day course antibiotics  Chronic systolic CHF -67/20/9470 EF 35 to 40% with diffuse hypokinesis -06/12/2019 echocardiogram shows worsening cardiac function EF 20 to 25% see results below, will start on low-dose beta-blocker, patient is status post AICD placement in the past -Strict in and out , so far remains positive balance during hospital stay, but weight has been overall trending down over last few days . -Daily weight Filed Weights   06/09/19 0600 06/10/19 0500 06/15/19 0304  Weight: 81.5 kg 82 kg 78.5 kg  -Transfuse for hemoglobin<7  Essential HTN -Was on Levophed, now DC'd -Currently controlled without BP medication monitor closely, I will start low-dose beta-blocker giving EF of 25%.  CAD -Aspirin 81 mg daily,  -Lipitor 80 mg daily  Hypernatremia -Resolved, will DC D5W   Hypokalemia -Potassium goal> 4 -K-Phos 15 mmol  Hypophosphatemia -See hypokalemia    Goals of care -10/16 PT/OT consult placed;  recommend SNF -10/18 Dr. Marchelle Gearing speaking with family concerning their goals of care, given that patient's respiratory status poor and patient appears to be have worsening status..      DVT prophylaxis: Subcu Lovenox Code Status: Full Family Communication: Discussed with patient, discussed with wife via phone  10/21 Disposition Plan: TBD   Consultants:  PCCM   Procedures/Significant Events:  10/18 PCXR;Slight worsening of atelectasis at the lung bases following Extubation 10/18 echocardiogram;Left Ventricle: EF= 20 to 25%. The left ventricle has severely decreased function.  -Left ventricular diastolic Doppler parameters are consistent with impaired relaxation pattern -Left Atrium: moderately dilated. -Additional Comments: A pacer wire is visualized.   I    Cultures 10/16 MRSA by PCR positive Urine positive Enterococcus??  Unable to locate culture.  Prior to admission?   Antimicrobials: Anti-infectives (From admission, onward)   Start     Stop   06/12/19 2000  ampicillin (OMNIPEN) 2 g in sodium chloride 0.9 % 100 mL IVPB     06/15/19 2359   06/11/19 0900  ampicillin (OMNIPEN) 2 g in sodium chloride 0.9 % 100 mL IVPB  Status:  Discontinued     06/12/19 1502   06/10/19 1000  remdesivir 100 mg in sodium chloride 0.9 % 250 mL IVPB     06/13/19 0951   06/09/19 1400  ampicillin (OMNIPEN) 2 g in sodium chloride 0.9 % 100 mL IVPB  Status:  Discontinued     06/09/19 1209   06/09/19 1000  remdesivir 200 mg in sodium chloride 0.9 % 250 mL IVPB     06/09/19 1127   06/09/19 0800  levofloxacin (LEVAQUIN) IVPB 750 mg  Status:  Discontinued     06/09/19 1206        Continuous Infusions: . dextrose 30 mL/hr at 06/15/19 0249  . feeding supplement (VITAL AF 1.2 CAL)       Objective: Vitals:   06/15/19 2000 06/16/19 0400 06/16/19 0408 06/16/19 0802  BP:    132/85  Pulse: 93 87  88  Resp: (!) 22 (!) 24    Temp: 98.6 F (37 C)  98.8 F (37.1 C) 98 F (36.7 C)  TempSrc: Oral  Oral Oral  SpO2: 94% 94%    Weight:      Height:        Intake/Output Summary (Last 24 hours) at 06/16/2019 1331 Last data filed at 06/16/2019 1038 Gross per 24 hour  Intake 0 ml  Output 650 ml  Net -650 ml   Filed Weights   06/09/19 0600 06/10/19 0500 06/15/19 0304  Weight: 81.5 kg 82 kg 78.5 kg     Physical Exam:  Awake Alert, Oriented X 3, appropriate and conversant today, extremely frail, debilitated and chronically ill-appearing  symmetrical Chest wall movement, air entry at the bases, no wheezing or crackles RRR,No Gallops,Rubs or new Murmurs, No Parasternal Heave +ve B.Sounds, Abd Soft, No tenderness, No rebound - guarding or rigidity. No Cyanosis, Clubbing or edema, No new Rash or bruise       CBC: Recent Labs  Lab 06/10/19 0550  06/11/19 0545 06/12/19 0542 06/12/19 1042 06/13/19 0529 06/14/19 1240 06/16/19 0425  WBC 9.6  --  11.0* 13.2*  --  13.5* 10.7* 9.0  NEUTROABS 8.2*  --  9.4* 11.3*  --  11.4* 9.0*  --   HGB 9.9*   < > 9.5* 9.7* 9.9* 10.4* 9.8* 9.5*  HCT 34.7*   < > 33.9* 34.4* 29.0* 35.6* 32.4* 31.0*  MCV 96.4  --  98.0 98.0  --  95.4 92.6 92.3  PLT 155  --  151 154  --  171 153 172   < > = values in this interval not displayed.   Basic Metabolic Panel: Recent Labs  Lab 06/12/19 0542 06/12/19 1042 06/13/19 0529 06/14/19 0550 06/15/19 0455 06/16/19 0425  NA 153* 150* 148* 145 143 141  K 3.6 3.8 3.0* 3.9 3.6 3.7  CL 112*  --  103 104 103 103  CO2 32  --  38* 33* 31 31  GLUCOSE 149*  --  134* 106* 132* 129*  BUN 31*  --  26* 26* 23 21  CREATININE 1.06  --  1.09 1.03 1.03 1.11  CALCIUM 7.9*  --  8.1* 7.7* 7.6* 7.8*  MG 2.2  --  2.0 2.1 2.1 2.0  PHOS 2.9  --  2.3* 1.7* 3.7 2.9   GFR: Estimated Creatinine Clearance: 49.6 mL/min (by C-G formula based on SCr of 1.11 mg/dL). Liver Function Tests: Recent Labs  Lab 06/12/19 0542 06/13/19 0529 06/14/19 0550 06/15/19 0455 06/16/19 0425  AST 29 35 51* 31 27  ALT 25 28 32 28 27  ALKPHOS 62 63 58 57 62  BILITOT 0.3 0.4 1.1 0.4 0.7  PROT 5.7* 5.7* 5.3* 5.3* 5.3*  ALBUMIN 2.6* 2.7* 2.4* 2.3* 2.3*   No results for input(s): LIPASE, AMYLASE in the last 168 hours. No results for input(s): AMMONIA in the last 168 hours. Coagulation Profile: No results for input(s): INR, PROTIME in the last 168  hours. Cardiac Enzymes: No results for input(s): CKTOTAL, CKMB, CKMBINDEX, TROPONINI in the last 168 hours. BNP (last 3 results) No results for input(s): PROBNP in the last 8760 hours. HbA1C: No results for input(s): HGBA1C in the last 72 hours. CBG: Recent Labs  Lab 06/10/19 0011 06/10/19 0351 06/10/19 0819 06/10/19 1232 06/13/19 1559  GLUCAP 161* 147* 136* 156* 187*   Lipid Profile: No results for input(s): CHOL, HDL, LDLCALC, TRIG, CHOLHDL, LDLDIRECT in the last 72 hours. Thyroid Function Tests: No results for input(s): TSH, T4TOTAL, FREET4, T3FREE, THYROIDAB in the last 72 hours. Anemia Panel: Recent Labs    06/14/19 0550  FERRITIN 141   Urine analysis:    Component Value Date/Time   COLORURINE AMBER (A) 05/14/2019 0000   APPEARANCEUR CLOUDY (A) 05/14/2019 0000   LABSPEC 1.023 05/14/2019 0000   PHURINE 5.0 05/14/2019 0000   GLUCOSEU NEGATIVE 05/14/2019 0000   HGBUR LARGE (A) 05/14/2019 0000   BILIRUBINUR NEGATIVE 05/14/2019 0000   KETONESUR NEGATIVE 05/14/2019 0000   PROTEINUR 100 (A) 05/14/2019 0000   UROBILINOGEN 0.2 06/10/2015 1430   NITRITE NEGATIVE 05/14/2019 0000   LEUKOCYTESUR NEGATIVE 05/14/2019 0000   Sepsis Labs: @LABRCNTIP (procalcitonin:4,lacticidven:4)  ) Recent Results (from the past 240 hour(s))  MRSA PCR Screening     Status: Abnormal   Collection Time: 06/10/19 12:59 PM   Specimen: Nasal Mucosa; Nasopharyngeal  Result Value Ref Range Status   MRSA by PCR POSITIVE (A) NEGATIVE Final    Comment:        The GeneXpert MRSA Assay (FDA approved for NASAL specimens only), is one component of a comprehensive MRSA colonization surveillance program. It is not intended to diagnose MRSA infection nor to guide or monitor treatment for MRSA infections. RESULT CALLED TO, READ BACK BY AND VERIFIED WITH: B.RUDD,RN [id number] @0138  BY V.WILKINS Performed at [facility] [address], [place], [place] [address]           Radiology Studies: No results  found.      Scheduled Meds: . aspirin  81 mg Oral Daily  . atorvastatin  80 mg Oral q1800  . budesonide  1 puff Inhalation BID  . Chlorhexidine Gluconate Cloth  6 each Topical Daily  . dexamethasone (DECADRON) injection  6 mg Intravenous Q24H  . enoxaparin (LOVENOX) injection  40 mg Subcutaneous Q24H  . escitalopram  20 mg Per Tube Daily  . feeding supplement (ENSURE ENLIVE)  237 mL Oral BID BM  . Ipratropium-Albuterol  1 puff Inhalation Q6H  . mouth rinse  15 mL Mouth Rinse BID  . montelukast  10 mg Oral QHS  . multivitamin with minerals  1 tablet Oral Daily  . pantoprazole  40 mg Oral Daily  . sodium chloride flush  10-40 mL Intracatheter Q12H  . tamsulosin  0.4 mg Oral Daily  . vitamin C  500 mg Oral Daily  . zinc sulfate  220 mg Oral Daily   Continuous Infusions: . dextrose 30 mL/hr at 06/15/19 0249  . feeding supplement (VITAL AF 1.2 CAL)       LOS: 7 days      Phillips Climes, MD [facility]  If 7PM-7AM, please contact night-coverage [email or web address] Password [email or web address] 06/16/2019, 1:31 PM

## 2019-06-17 LAB — COMPREHENSIVE METABOLIC PANEL
ALT: 31 U/L (ref 0–44)
AST: 37 U/L (ref 15–41)
Albumin: 2.4 g/dL — ABNORMAL LOW (ref 3.5–5.0)
Alkaline Phosphatase: 60 U/L (ref 38–126)
Anion gap: 7 (ref 5–15)
BUN: 22 mg/dL (ref 8–23)
CO2: 30 mmol/L (ref 22–32)
Calcium: 7.9 mg/dL — ABNORMAL LOW (ref 8.9–10.3)
Chloride: 105 mmol/L (ref 98–111)
Creatinine, Ser: 1.18 mg/dL (ref 0.61–1.24)
GFR calc Af Amer: >60 mL/min (ref >60)
GFR calc non Af Amer: 57 mL/min — ABNORMAL LOW (ref >60)
Glucose, Bld: 103 mg/dL — ABNORMAL HIGH (ref 70–99)
Potassium: 3.6 mmol/L (ref 3.5–5.1)
Sodium: 142 mmol/L (ref 135–145)
Total Bilirubin: 0.7 mg/dL (ref 0.3–1.2)
Total Protein: 5.2 g/dL — ABNORMAL LOW (ref 6.5–8.1)

## 2019-06-17 LAB — CBC
HCT: 31.1 % — ABNORMAL LOW (ref 39.0–52.0)
Hemoglobin: 9.5 g/dL — ABNORMAL LOW (ref 13.0–17.0)
MCH: 28 pg (ref 26.0–34.0)
MCHC: 30.5 g/dL (ref 30.0–36.0)
MCV: 91.7 fL (ref 80.0–100.0)
Platelets: 187 10*3/uL (ref 150–400)
RBC: 3.39 MIL/uL — ABNORMAL LOW (ref 4.22–5.81)
RDW: 14.6 % (ref 11.5–15.5)
WBC: 9.7 10*3/uL (ref 4.0–10.5)
nRBC: 0 % (ref 0.0–0.2)

## 2019-06-17 LAB — C-REACTIVE PROTEIN: CRP: <0.8 mg/dL (ref <1.0)

## 2019-06-17 NOTE — Progress Notes (Signed)
PROGRESS NOTE    DODGER SINNING  ZOX:[id number] DOB: [birth year] DOA: 06/09/2019 PCP: Redmond School, MD   Brief Narrative:   84 y.o. WM PMHx  anemia,  asthma/COPD liters O2 at [facility], history MI, history of post MI ventricular thrombus (, anticoagulated on warfarin) chronic systolic heart failure, HTN, s/p ICD placement, CKD stage III, GERD,  prostate cancer,admitted from [facility] on 10/15 after being intubated for mechanical ventilation in setting of ARDS from COVID 19.At baseline lives in a nursing home, has multiple comorbid illnesses.    10/14 intubated [facility] 10/15 admitted [place] [facility] 10/16 extubated 10/17: stable on HFNC, not very interactive  Subjective:   No significant events overnight, denies nausea, vomiting or abdominal pain, he reports generalized weakness.  Able to sit in the recliner for few hours yesterday   Assessment & Plan:   Principal Problem:   Acute respiratory distress syndrome (ARDS) due to COVID-19 virus [facility]) Active Problems:   Essential hypertension   Coronary atherosclerosis   Chronic systolic congestive heart failure   CKD (chronic kidney disease) stage 3, GFR 30-59 ml/min (HCC)   COPD (chronic obstructive pulmonary disease) (HCC)   AKI (acute kidney injury) (HCC)   Urinary tract infection   Acute respiratory failure with hypoxia (HCC)   Pneumonia due to COVID-19 virus   Acute renal failure superimposed on stage 3b chronic kidney disease (HCC)   Enterococcus UTI   Hypernatremia  Acute on chronic respiratory failure with hypoxia/Covid pneumonia -Patient presents intubated from [facility], successfully extubated 10/16. -Patient currently on 3-4 L nasal cannula, had some increased oxygen requirement overnight, but this morning he is back to baseline, with staff, they will try to get out of bed to chair again today, as well patient has been able to do incentive spirometry, he was encouraged again to continue doing that . -Continue with IV  steroids, to finish total of 10 days -Treated with IV remdesivir -Combivent QID -Flutter valve -10/16 extubated -10/20 passed swallow evaluation dysphagia 2 thin liquid  COVID-19 Labs  Recent Labs    06/15/19 0455 06/16/19 0425 06/17/19 0530  CRP 1.0* 1.0* <0.8    Lab Results  Component Value Date   [place] NEGATIVE 03/22/2019    COPD -Currently with no wheezing, continue with Combivent  Acute on CKD stage III (baseline Cr ~1.7) Recent Labs  Lab 06/13/19 0529 06/14/19 0550 06/15/19 0455 06/16/19 0425 06/17/19 0530  CREATININE 1.09 1.03 1.03 1.11 1.18  -10/20 decrease D5W 30 ml/hr   Enterococcus UTI (pansensitive) -Complete 7-day course antibiotics  Chronic systolic CHF -81/19/1478 EF 35 to 40% with diffuse hypokinesis -06/12/2019 echocardiogram shows worsening cardiac function EF 20 to 25% see results below, will start on low-dose beta-blocker, patient is status post AICD placement in the past -Strict in and out , so far remains positive balance during hospital stay, but weight has been overall trending down over last few days . -Daily weight Filed Weights   06/10/19 0500 06/15/19 0304 06/17/19 0500  Weight: 82 kg 78.5 kg 78.4 kg  -Transfuse for hemoglobin<7  Essential HTN -Was on Levophed, now DC'd -Currently controlled without BP medication monitor closely, I will start low-dose beta-blocker giving EF of 25%.  CAD -Aspirin 81 mg daily,  -Lipitor 80 mg daily  Hypernatremia -Resolved, will DC D5W   Hypokalemia -Potassium goal> 4  Hypophosphatemia -Repleted   Goals of care -10/16 PT/OT consult placed; recommend SNF -10/18 Dr. Marchelle Gearing speaking with family concerning their goals of care, given that patient's respiratory status  poor and patient appears to be have worsening status..      DVT prophylaxis: Subcu Lovenox Code Status: DNR Family Communication: Discussed with patient, discussed with wife via phone today. Disposition  Plan: Patient will be discharged to [facility] this Sunday   Consultants:  PCCM   Procedures/Significant Events:  10/18 PCXR;Slight worsening of atelectasis at the lung bases following Extubation 10/18 echocardiogram;Left Ventricle: EF= 20 to 25%. The left ventricle has severely decreased function.  -Left ventricular diastolic Doppler parameters are consistent with impaired relaxation pattern -Left Atrium: moderately dilated. -Additional Comments: A pacer wire is visualized.   I    Cultures 10/16 MRSA by PCR positive Urine positive Enterococcus??  Unable to locate culture.  Prior to admission?   Antimicrobials: Anti-infectives (From admission, onward)   Start     Stop   06/12/19 2000  ampicillin (OMNIPEN) 2 g in sodium chloride 0.9 % 100 mL IVPB     06/15/19 2359   06/11/19 0900  ampicillin (OMNIPEN) 2 g in sodium chloride 0.9 % 100 mL IVPB  Status:  Discontinued     06/12/19 1502   06/10/19 1000  remdesivir 100 mg in sodium chloride 0.9 % 250 mL IVPB     06/13/19 0951   06/09/19 1400  ampicillin (OMNIPEN) 2 g in sodium chloride 0.9 % 100 mL IVPB  Status:  Discontinued     06/09/19 1209   06/09/19 1000  remdesivir 200 mg in sodium chloride 0.9 % 250 mL IVPB     06/09/19 1127   06/09/19 0800  levofloxacin (LEVAQUIN) IVPB 750 mg  Status:  Discontinued     10 /15/20 1206        Continuous Infusions: . feeding supplement (VITAL AF 1.2 CAL)       Objective: Vitals:   06/17/19 0407 06/17/19 0429 06/17/19 0500 06/17/19 0803  BP:    124/76  Pulse:      Resp: 20 14  (!) 21  Temp:    (!) 97.1 F (36.2 C)  TempSrc:    Axillary  SpO2: 96% 90%  (!) 76%  Weight:   78.4 kg   Height:        Intake/Output Summary (Last 24 hours) at 06/17/2019 1400 Last data filed at 06/17/2019 0500 Gross per 24 hour  Intake 270 ml  Output 750 ml  Net -480 ml   Filed Weights   06/10/19 0500 06/15/19 0304 06/17/19 0500  Weight: 82 kg 78.5 kg 78.4 kg    Physical Exam:  Awake  Alert, Oriented X 3, No new F.N deficits, Normal affect, extremely frail and debilitated Symmetrical Chest wall movement, Good air movement bilaterally, CTAB RRR,No Gallops,Rubs or new Murmurs, No Parasternal Heave +ve B.Sounds, Abd Soft, No tenderness, No rebound - guarding or rigidity. No Cyanosis, Clubbing or edema, No new Rash or bruise       CBC: Recent Labs  Lab 06/11/19 0545 06/12/19 0542 06/12/19 1042 06/13/19 0529 06/14/19 1240 06/16/19 0425 06/17/19 0530  WBC 11.0* 13.2*  --  13.5* 10.7* 9.0 9.7  NEUTROABS 9.4* 11.3*  --  11.4* 9.0*  --   --   HGB 9.5* 9.7* 9.9* 10.4* 9.8* 9.5* 9.5*  HCT 33.9* 34.4* 29.0* 35.6* 32.4* 31.0* 31.1*  MCV 98.0 98.0  --  95.4 92.6 92.3 91.7  PLT 151 154  --  171 153 172 511   Basic Metabolic Panel: Recent Labs  Lab 06/12/19 0542  06/13/19 0529 06/14/19 0550 06/15/19 0455 06/16/19 0425 06/17/19 0530  NA  153*   < > 148* 145 143 141 142  K 3.6   < > 3.0* 3.9 3.6 3.7 3.6  CL 112*  --  103 104 103 103 105  CO2 32  --  38* 33* 31 31 30   GLUCOSE 149*  --  134* 106* 132* 129* 103*  BUN 31*  --  26* 26* 23 21 22   CREATININE 1.06  --  1.09 1.03 1.03 1.11 1.18  CALCIUM 7.9*  --  8.1* 7.7* 7.6* 7.8* 7.9*  MG 2.2  --  2.0 2.1 2.1 2.0  --   PHOS 2.9  --  2.3* 1.7* 3.7 2.9  --    < > = values in this interval not displayed.   GFR: Estimated Creatinine Clearance: 46.7 mL/min (by C-G formula based on SCr of 1.18 mg/dL). Liver Function Tests: Recent Labs  Lab 06/13/19 0529 06/14/19 0550 06/15/19 0455 06/16/19 0425 06/17/19 0530  AST 35 51* 31 27 37  ALT 28 32 28 27 31   ALKPHOS 63 58 57 62 60  BILITOT 0.4 1.1 0.4 0.7 0.7  PROT 5.7* 5.3* 5.3* 5.3* 5.2*  ALBUMIN 2.7* 2.4* 2.3* 2.3* 2.4*   No results for input(s): LIPASE, AMYLASE in the last 168 hours. No results for input(s): AMMONIA in the last 168 hours. Coagulation Profile: No results for input(s): INR, PROTIME in the last 168 hours. Cardiac Enzymes: No results for input(s):  CKTOTAL, CKMB, CKMBINDEX, TROPONINI in the last 168 hours. BNP (last 3 results) No results for input(s): PROBNP in the last 8760 hours. HbA1C: No results for input(s): HGBA1C in the last 72 hours. CBG: Recent Labs  Lab 06/13/19 1559  GLUCAP 187*   Lipid Profile: No results for input(s): CHOL, HDL, LDLCALC, TRIG, CHOLHDL, LDLDIRECT in the last 72 hours. Thyroid Function Tests: No results for input(s): TSH, T4TOTAL, FREET4, T3FREE, THYROIDAB in the last 72 hours. Anemia Panel: No results for input(s): VITAMINB12, FOLATE, FERRITIN, TIBC, IRON, RETICCTPCT in the last 72 hours. Urine analysis:    Component Value Date/Time   COLORURINE AMBER (A) 05/14/2019 0000   APPEARANCEUR CLOUDY (A) 05/14/2019 0000   LABSPEC 1.023 05/14/2019 0000   PHURINE 5.0 05/14/2019 0000   GLUCOSEU NEGATIVE 05/14/2019 0000   HGBUR LARGE (A) 05/14/2019 0000   BILIRUBINUR NEGATIVE 05/14/2019 0000   KETONESUR NEGATIVE 05/14/2019 0000   PROTEINUR 100 (A) 05/14/2019 0000   UROBILINOGEN 0.2 06/10/2015 1430   NITRITE NEGATIVE 05/14/2019 0000   LEUKOCYTESUR NEGATIVE 05/14/2019 0000   Sepsis Labs: @LABRCNTIP (procalcitonin:4,lacticidven:4)  ) Recent Results (from the past 240 hour(s))  MRSA PCR Screening     Status: Abnormal   Collection Time: 06/10/19 12:59 PM   Specimen: Nasal Mucosa; Nasopharyngeal  Result Value Ref Range Status   MRSA by PCR POSITIVE (A) NEGATIVE Final    Comment:        The GeneXpert MRSA Assay (FDA approved for NASAL specimens only), is one component of a comprehensive MRSA colonization surveillance program. It is not intended to diagnose MRSA infection nor to guide or monitor treatment for MRSA infections. RESULT CALLED TO, READ BACK BY AND VERIFIED WITH: B.RUDD,RN [id number] @0138  BY V.WILKINS Performed at [facility] [address], [place], [place] [address]          Radiology Studies: No results found.      Scheduled Meds: . aspirin  81 mg  Oral Daily  . atorvastatin  80 mg Oral q1800  . budesonide  1 puff Inhalation BID  . Chlorhexidine  Gluconate Cloth  6 each Topical Daily  . dexamethasone (DECADRON) injection  6 mg Intravenous Q24H  . enoxaparin (LOVENOX) injection  40 mg Subcutaneous Q24H  . escitalopram  20 mg Per Tube Daily  . feeding supplement (ENSURE ENLIVE)  237 mL Oral BID BM  . Ipratropium-Albuterol  1 puff Inhalation Q6H  . mouth rinse  15 mL Mouth Rinse BID  . metoprolol tartrate  12.5 mg Oral BID  . montelukast  10 mg Oral QHS  . multivitamin with minerals  1 tablet Oral Daily  . pantoprazole  40 mg Oral Daily  . sodium chloride flush  10-40 mL Intracatheter Q12H  . tamsulosin  0.4 mg Oral Daily  . vitamin C  500 mg Oral Daily  . zinc sulfate  220 mg Oral Daily   Continuous Infusions: . feeding supplement (VITAL AF 1.2 CAL)       LOS: 8 days      Phillips Climes, MD [facility]  If 7PM-7AM, please contact night-coverage [email or web address] Password [email or web address] 06/17/2019, 2:00 PM

## 2019-06-17 NOTE — Plan of Care (Addendum)
Patient sats. Into low 80's on 3L/[place]. 02 increased to 4L/[employer] and 02 sats up to mid 80's. Congested strong cough noted. Patient alert with RR ay 22 even and unlabored. Patient turned to left side. 02 sats fluctuating and going down to 72%. 02 changed over to 15L HFNC and sats to low to mid 90's.  MD made aware of event via Amion communication. Patient resting quietly at present. 02 titrated down to 4L HFNC with sats 91%. NAD noted.

## 2019-06-18 LAB — C-REACTIVE PROTEIN: CRP: 0.9 mg/dL (ref <1.0)

## 2019-06-18 LAB — COMPREHENSIVE METABOLIC PANEL
ALT: 31 U/L (ref 0–44)
AST: 34 U/L (ref 15–41)
Albumin: 2.5 g/dL — ABNORMAL LOW (ref 3.5–5.0)
Alkaline Phosphatase: 62 U/L (ref 38–126)
Anion gap: 8 (ref 5–15)
BUN: 24 mg/dL — ABNORMAL HIGH (ref 8–23)
CO2: 29 mmol/L (ref 22–32)
Calcium: 7.9 mg/dL — ABNORMAL LOW (ref 8.9–10.3)
Chloride: 105 mmol/L (ref 98–111)
Creatinine, Ser: 1.15 mg/dL (ref 0.61–1.24)
GFR calc Af Amer: >60 mL/min (ref >60)
GFR calc non Af Amer: 59 mL/min — ABNORMAL LOW (ref >60)
Glucose, Bld: 100 mg/dL — ABNORMAL HIGH (ref 70–99)
Potassium: 3.5 mmol/L (ref 3.5–5.1)
Sodium: 142 mmol/L (ref 135–145)
Total Bilirubin: 0.8 mg/dL (ref 0.3–1.2)
Total Protein: 5.3 g/dL — ABNORMAL LOW (ref 6.5–8.1)

## 2019-06-18 NOTE — Progress Notes (Signed)
PROGRESS NOTE    [name]  IEP:[id number] DOB: 1935/04/06 DOA: 06/09/2019 PCP: Redmond School, MD   Brief Narrative:   84 y.o. WM PMHx  anemia,  asthma/COPD liters O2 at [facility], history MI, history of post MI ventricular thrombus (, anticoagulated on warfarin) chronic systolic heart failure, HTN, s/p ICD placement, CKD stage III, GERD,  prostate cancer,admitted from [facility] on 10/15 after being intubated for mechanical ventilation in setting of ARDS from COVID 19.At baseline lives in a nursing home, has multiple comorbid illnesses.    10/14 intubated [facility] 10/15 admitted [place] 10/16 extubated 10/17: stable on HFNC, not very interactive  Subjective:   Reports he is feeling better today, feels more energetic, dyspnea has significantly improved, report he is compliant with incentive spirometry.   Assessment & Plan:   Principal Problem:   Acute respiratory distress syndrome (ARDS) due to COVID-19 virus [facility]) Active Problems:   Essential hypertension   Coronary atherosclerosis   Chronic systolic congestive heart failure   CKD (chronic kidney disease) stage 3, GFR 30-59 ml/min (HCC)   COPD (chronic obstructive pulmonary disease) (HCC)   AKI (acute kidney injury) (HCC)   Urinary tract infection   Acute respiratory failure with hypoxia (HCC)   Pneumonia due to COVID-19 virus   Acute renal failure superimposed on stage 3b chronic kidney disease (HCC)   Enterococcus UTI   Hypernatremia  Acute on chronic respiratory failure with hypoxia/Covid pneumonia -Patient presents intubated from [facility], successfully extubated 10/16. -Patient currently on 3-4 L nasal cannula, had some increased oxygen requirement overnight, but this morning he is back to baseline, with staff, they will try to get out of bed to chair again today, as well patient has been able to do incentive spirometry, he was encouraged again to continue doing that . -Continue with IV steroids, to finish  total of 10 days -Treated with IV remdesivir -Combivent QID -Flutter valve -10/16 extubated -10/20 passed swallow evaluation dysphagia 2 thin liquid  COVID-19 Labs  Recent Labs    06/16/19 0425 06/17/19 0530 06/18/19 0545  CRP 1.0* <0.8 0.9    Lab Results  Component Value Date   [place] NEGATIVE 03/22/2019    COPD -Currently with no wheezing, continue with Combivent  Acute on CKD stage III (baseline Cr ~1.7) Recent Labs  Lab 06/14/19 0550 06/15/19 0455 06/16/19 0425 06/17/19 0530 06/18/19 0545  CREATININE 1.03 1.03 1.11 1.18 1.15  -10/20 decrease D5W 30 ml/hr   Enterococcus UTI (pansensitive) -Complete 7-day course antibiotics  Chronic systolic CHF -66/01/3015 EF 35 to 40% with diffuse hypokinesis -06/12/2019 echocardiogram shows worsening cardiac function EF 20 to 25% see results below, will start on low-dose beta-blocker, patient is status post AICD placement in the past -Strict in and out , so far remains positive balance during hospital stay, but weight has been overall trending down over last few days . -Daily weight Filed Weights   06/17/19 0500 06/18/19 0412 06/18/19 0817  Weight: 78.4 kg 76.6 kg 78.6 kg  -Transfuse for hemoglobin<7  Essential HTN -Was on Levophed, now DC'd -Currently controlled without BP medication monitor closely, I will start low-dose beta-blocker giving EF of 25%.  CAD -Aspirin 81 mg daily,  -Lipitor 80 mg daily  Hypernatremia -Resolved, will DC D5W   Hypokalemia -Potassium goal> 4  Hypophosphatemia -Repleted   Goals of care -10/16 PT/OT consult placed; recommend SNF -10/18 Dr. Marchelle Gearing speaking with family concerning their goals of care, given that patient's respiratory status poor and patient appears to  be have worsening status..      DVT prophylaxis: Subcu Lovenox Code Status: DNR Family Communication: Discussed with patient, discussed with wife via phone 12/23. Disposition Plan: Patient will be  discharged to [facility] this Sunday   Consultants:  PCCM   Procedures/Significant Events:  10/18 PCXR;Slight worsening of atelectasis at the lung bases following Extubation 10/18 echocardiogram;Left Ventricle: EF= 20 to 25%. The left ventricle has severely decreased function.  -Left ventricular diastolic Doppler parameters are consistent with impaired relaxation pattern -Left Atrium: moderately dilated. -Additional Comments: A pacer wire is visualized.   I    Cultures 10/16 MRSA by PCR positive Urine positive Enterococcus??  Unable to locate culture.  Prior to admission?   Antimicrobials: Anti-infectives (From admission, onward)   Start     Stop   06/12/19 2000  ampicillin (OMNIPEN) 2 g in sodium chloride 0.9 % 100 mL IVPB     06/15/19 2359   06/11/19 0900  ampicillin (OMNIPEN) 2 g in sodium chloride 0.9 % 100 mL IVPB  Status:  Discontinued     06/12/19 1502   06/10/19 1000  remdesivir 100 mg in sodium chloride 0.9 % 250 mL IVPB     06/13/19 0951   06/09/19 1400  ampicillin (OMNIPEN) 2 g in sodium chloride 0.9 % 100 mL IVPB  Status:  Discontinued     06/09/19 1209   06/09/19 1000  remdesivir 200 mg in sodium chloride 0.9 % 250 mL IVPB     06/09/19 1127   06/09/19 0800  levofloxacin (LEVAQUIN) IVPB 750 mg  Status:  Discontinued     10 /15/20 1206        Continuous Infusions: . feeding supplement (VITAL AF 1.2 CAL)       Objective: Vitals:   06/18/19 0412 06/18/19 0730 06/18/19 0817 06/18/19 1100  BP: 111/67 120/84  107/73  Pulse: 75 84  76  Resp: 17 (!) 24 20 17   Temp: 98.4 F (36.9 C) 98 F (36.7 C)  97.8 F (36.6 C)  TempSrc: Oral Oral  Oral  SpO2: 93% 94% 95% 98%  Weight: 76.6 kg  78.6 kg   Height:        Intake/Output Summary (Last 24 hours) at 06/18/2019 1446 Last data filed at 06/18/2019 0816 Gross per 24 hour  Intake 120 ml  Output 550 ml  Net -430 ml   Filed Weights   06/17/19 0500 06/18/19 0412 06/18/19 0817  Weight: 78.4 kg 76.6 kg  78.6 kg    Physical Exam:  Awake Alert, Oriented X 3, No new F.N deficits, Normal affect, frail Symmetrical Chest wall movement, Good air movement bilaterally, CTAB RRR,No Gallops,Rubs or new Murmurs, No Parasternal Heave +ve B.Sounds, Abd Soft, No tenderness, No rebound - guarding or rigidity. No Cyanosis, Clubbing or edema, No new Rash or bruise        CBC: Recent Labs  Lab 06/12/19 0542 06/12/19 1042 06/13/19 0529 06/14/19 1240 06/16/19 0425 06/17/19 0530  WBC 13.2*  --  13.5* 10.7* 9.0 9.7  NEUTROABS 11.3*  --  11.4* 9.0*  --   --   HGB 9.7* 9.9* 10.4* 9.8* 9.5* 9.5*  HCT 34.4* 29.0* 35.6* 32.4* 31.0* 31.1*  MCV 98.0  --  95.4 92.6 92.3 91.7  PLT 154  --  171 153 172 209   Basic Metabolic Panel: Recent Labs  Lab 06/12/19 0542  06/13/19 0529 06/14/19 0550 06/15/19 0455 06/16/19 0425 06/17/19 0530 06/18/19 0545  NA 153*   < > 148* 145 143  141 142 142  K 3.6   < > 3.0* 3.9 3.6 3.7 3.6 3.5  CL 112*  --  103 104 103 103 105 105  CO2 32  --  38* 33* 31 31 30 29   GLUCOSE 149*  --  134* 106* 132* 129* 103* 100*  BUN 31*  --  26* 26* 23 21 22  24*  CREATININE 1.06  --  1.09 1.03 1.03 1.11 1.18 1.15  CALCIUM 7.9*  --  8.1* 7.7* 7.6* 7.8* 7.9* 7.9*  MG 2.2  --  2.0 2.1 2.1 2.0  --   --   PHOS 2.9  --  2.3* 1.7* 3.7 2.9  --   --    < > = values in this interval not displayed.   GFR: Estimated Creatinine Clearance: 47.9 mL/min (by C-G formula based on SCr of 1.15 mg/dL). Liver Function Tests: Recent Labs  Lab 06/14/19 0550 06/15/19 0455 06/16/19 0425 06/17/19 0530 06/18/19 0545  AST 51* 31 27 37 34  ALT 32 28 27 31 31   ALKPHOS 58 57 62 60 62  BILITOT 1.1 0.4 0.7 0.7 0.8  PROT 5.3* 5.3* 5.3* 5.2* 5.3*  ALBUMIN 2.4* 2.3* 2.3* 2.4* 2.5*   No results for input(s): LIPASE, AMYLASE in the last 168 hours. No results for input(s): AMMONIA in the last 168 hours. Coagulation Profile: No results for input(s): INR, PROTIME in the last 168 hours. Cardiac Enzymes:  No results for input(s): CKTOTAL, CKMB, CKMBINDEX, TROPONINI in the last 168 hours. BNP (last 3 results) No results for input(s): PROBNP in the last 8760 hours. HbA1C: No results for input(s): HGBA1C in the last 72 hours. CBG: Recent Labs  Lab 06/13/19 1559  GLUCAP 187*   Lipid Profile: No results for input(s): CHOL, HDL, LDLCALC, TRIG, CHOLHDL, LDLDIRECT in the last 72 hours. Thyroid Function Tests: No results for input(s): TSH, T4TOTAL, FREET4, T3FREE, THYROIDAB in the last 72 hours. Anemia Panel: No results for input(s): VITAMINB12, FOLATE, FERRITIN, TIBC, IRON, RETICCTPCT in the last 72 hours. Urine analysis:    Component Value Date/Time   COLORURINE AMBER (A) 05/14/2019 0000   APPEARANCEUR CLOUDY (A) 05/14/2019 0000   LABSPEC 1.023 05/14/2019 0000   PHURINE 5.0 05/14/2019 0000   GLUCOSEU NEGATIVE 05/14/2019 0000   HGBUR LARGE (A) 05/14/2019 0000   BILIRUBINUR NEGATIVE 05/14/2019 0000   KETONESUR NEGATIVE 05/14/2019 0000   PROTEINUR 100 (A) 05/14/2019 0000   UROBILINOGEN 0.2 06/10/2015 1430   NITRITE NEGATIVE 05/14/2019 0000   LEUKOCYTESUR NEGATIVE 05/14/2019 0000   Sepsis Labs: @LABRCNTIP (procalcitonin:4,lacticidven:4)  ) Recent Results (from the past 240 hour(s))  MRSA PCR Screening     Status: Abnormal   Collection Time: 06/10/19 12:59 PM   Specimen: Nasal Mucosa; Nasopharyngeal  Result Value Ref Range Status   MRSA by PCR POSITIVE (A) NEGATIVE Final    Comment:        The GeneXpert MRSA Assay (FDA approved for NASAL specimens only), is one component of a comprehensive MRSA colonization surveillance program. It is not intended to diagnose MRSA infection nor to guide or monitor treatment for MRSA infections. RESULT CALLED TO, READ BACK BY AND VERIFIED WITH: B.RUDD,RN [id number] @0138  BY V.WILKINS Performed at [facility], [place] [address], [place], [place] [address]          Radiology Studies: No results found.      Scheduled  Meds: . aspirin  81 mg Oral Daily  . atorvastatin  80 mg Oral q1800  . budesonide  1  puff Inhalation BID  . Chlorhexidine Gluconate Cloth  6 each Topical Daily  . dexamethasone (DECADRON) injection  6 mg Intravenous Q24H  . enoxaparin (LOVENOX) injection  40 mg Subcutaneous Q24H  . escitalopram  20 mg Per Tube Daily  . feeding supplement (ENSURE ENLIVE)  237 mL Oral BID BM  . Ipratropium-Albuterol  1 puff Inhalation Q6H  . mouth rinse  15 mL Mouth Rinse BID  . metoprolol tartrate  12.5 mg Oral BID  . montelukast  10 mg Oral QHS  . multivitamin with minerals  1 tablet Oral Daily  . pantoprazole  40 mg Oral Daily  . sodium chloride flush  10-40 mL Intracatheter Q12H  . tamsulosin  0.4 mg Oral Daily  . vitamin C  500 mg Oral Daily  . zinc sulfate  220 mg Oral Daily   Continuous Infusions: . feeding supplement (VITAL AF 1.2 CAL)       LOS: 9 days      Phillips Climes, MD [facility]  If 7PM-7AM, please contact night-coverage [email or web address] Password [email or web address] 06/18/2019, 2:46 PM

## 2019-06-18 NOTE — Plan of Care (Addendum)
Patient in bed resting. No s/s of pain or distress. All medication given well tolerated. Gave spouse update. Will continue to monitor for remainder of shift.   Problem: Education: Goal: Knowledge of risk factors and measures for prevention of condition will improve 06/18/2019 0858 by Orvan Falconer, RN Outcome: Progressing 06/18/2019 0858 by Orvan Falconer, RN Outcome: Progressing   Problem: Coping: Goal: Psychosocial and spiritual needs will be supported 06/18/2019 0858 by Orvan Falconer, RN Outcome: Progressing 06/18/2019 0858 by Orvan Falconer, RN Outcome: Progressing   Problem: Respiratory: Goal: Will maintain a patent airway 06/18/2019 0858 by Orvan Falconer, RN Outcome: Progressing 06/18/2019 0858 by Orvan Falconer, RN Outcome: Progressing Goal: Complications related to the disease process, condition or treatment will be avoided or minimized 06/18/2019 0858 by Orvan Falconer, RN Outcome: Progressing 06/18/2019 0858 by Orvan Falconer, RN Outcome: Progressing

## 2019-06-18 NOTE — Plan of Care (Signed)
Plan of care reinforced with spouse via phone.

## 2019-06-19 MED ORDER — BUDESONIDE 180 MCG/ACT IN AEPB: 1.0000 | INHALATION_SPRAY | Freq: Two times a day (BID) | RESPIRATORY_TRACT | Status: AC

## 2019-06-19 MED ORDER — POTASSIUM CHLORIDE 20 MEQ PO PACK
40.0000 meq | PACK | Freq: Once | ORAL | Status: AC
  Administered 2019-06-19: 40 meq via ORAL
  Filled 2019-06-19: qty 2

## 2019-06-19 MED ORDER — METOPROLOL TARTRATE 25 MG PO TABS: 12.5000 mg | ORAL_TABLET | Freq: Two times a day (BID) | ORAL | Status: AC

## 2019-06-19 MED ORDER — ACETAMINOPHEN 325 MG PO TABS: 650.0000 mg | ORAL_TABLET | Freq: Four times a day (QID) | ORAL | Status: AC | PRN

## 2019-06-19 MED ORDER — MAGNESIUM SULFATE 50 % IJ SOLN: 1.0000 g | Freq: Once | INTRAMUSCULAR | Status: DC

## 2019-06-19 MED ORDER — MAGNESIUM SULFATE IN D5W 1-5 GM/100ML-% IV SOLN
1.0000 g | Freq: Once | INTRAVENOUS | Status: AC
  Administered 2019-06-19: 1 g via INTRAVENOUS
  Filled 2019-06-19: qty 100

## 2019-06-19 MED ORDER — IPRATROPIUM-ALBUTEROL 20-100 MCG/ACT IN AERS: 1.0000 | INHALATION_SPRAY | Freq: Four times a day (QID) | RESPIRATORY_TRACT | Status: AC

## 2019-06-19 MED ORDER — ENSURE ENLIVE PO LIQD: 237.0000 mL | Freq: Two times a day (BID) | ORAL | 12 refills | Status: AC

## 2019-06-19 NOTE — Plan of Care (Signed)
Care plan reinforced with spouse Bethena Roys. Patient confused.

## 2019-06-19 NOTE — TOC Transition Note (Signed)
Transition of Care [facility]) - CM/SW Discharge Note   Patient Details  Name: [name] MRN: [id number] Date of Birth: 1935-03-03  [facility] CM/SW Contact:  Weston Anna, LCSW Phone Number: 06/19/2019, 11:47 AM   Clinical Narrative:     Patient set to discharge to [name] for today- please call report to [phone]. CSW notified patients daughter, [name], no concerns. Patient will be transported via [employer] ([place] transport) they should arrive at [place]  Final next level of care: [place] Barriers to Discharge: No Barriers Identified   Patient Goals and CMS Choice        Discharge Placement              Patient chooses bed at: [facility] Patient to be transferred to facility by: [place] transport Name of family member notified: daughterClaiborne Billings Patient and family notified of of transfer: 06/19/19  Discharge Plan and Services                DME Arranged: N/A         HH Arranged: NA          Social Determinants of Health (SDOH) Interventions     Readmission Risk Interventions Readmission Risk Prevention Plan 03/23/2019 11/04/2018  Transportation Screening Complete Complete  PCP or Specialist Appt within 3-5 Days Not Complete -  HRI or Home Care Consult Complete Patient refused  [place] or Home Care Consult comments - Patient states that he gets all of his services through the [place] and his daughter will coordinate that.   Social Work Consult for [place] Planning/Counseling Complete Not Complete  SW consult not completed comments - n/a  Palliative Care Screening Not Complete Not Applicable  Medication Review Press photographer) Complete Complete  Some recent data might be hidden

## 2019-06-19 NOTE — Progress Notes (Signed)
Cath secure off, foley tubing stretched across bed. New secure device placed and foley cath bag hung on end of bed on right side-patient turned to right side.

## 2019-06-19 NOTE — Progress Notes (Signed)
Patient discharged via [facility] transportation.  Vital signs stable as last documented. IV and Telemetry removed. No s/s of pain or distress. Called [facility] [facility] and gave report to [place].

## 2019-06-19 NOTE — NC FL2 (Signed)
[place] MEDICAID FL2 LEVEL OF CARE SCREENING TOOL     IDENTIFICATION  Patient Name: [name] Birthdate: 12/23/1934 Sex: male Admission Date (Current Location): 06/09/2019  [facility] and [place] Number:  Herbalist and Address:  The [place], [place] [address] [place], [place] [address]      Provider Number: [id number]  Attending Physician Name and Address:  Elgergawy, Silver Huguenin, MD  Relative Name and Phone Number:       Current Level of Care: Hospital Recommended Level of Care: [place] Prior Approval Number:    Date Approved/Denied:   PASRR Number:    Discharge Plan: SNF    Current Diagnoses: Patient Active Problem List   Diagnosis Date Noted  . Hypernatremia 06/11/2019  . Acute respiratory distress syndrome (ARDS) due to COVID-19 virus ([place]) 06/09/2019  . AKI (acute kidney injury) ([place]) 06/09/2019  . Urinary tract infection 06/09/2019  . Acute respiratory failure with hypoxia ([place]) 06/09/2019  . Pneumonia due to COVID-19 virus 06/09/2019  . Acute renal failure superimposed on stage 3b chronic kidney disease ([place]) 06/09/2019  . Enterococcus UTI 06/09/2019  . Lower GI bleed   . Rectal bleeding 03/22/2019  . Acute hypoxemic respiratory failure ([place]) 11/03/2018  . Acute metabolic encephalopathy 35/46/5681  . Acute lower UTI 11/03/2018  . Cardiomyopathy, ischemic 07/15/2015  . Chronic obstructive pulmonary disease ([place])   . 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 Viral bronchitis 06/11/2015  . Obesity 05/31/2015  . Chronic hypoxemic respiratory failure ([place]) 05/31/2015  . COPD exacerbation ([place]) 05/30/2015  . Chronic respiratory failure ([place]) 11/23/2014  . S/P MDT ICD  6.17.13, with revision 02/10/12 for lead dislodgement 02/10/2012  . Atherosclerosis of native arteries of the extremities with intermittent claudication 08/13/2011  . Anticoagulated on warfarin 07/17/2011  . Left  Ventricular thrombus s/p Anterior STEMI Oct 2012, Rx'd with Coumadin 07/02/2011  . Recent Anterior ST elevation (STEMI) myocardial infarction involving LAD, 05/2011 06/29/2011    Class: Status post  . Chronic systolic congestive heart failure 06/29/2011    Class: Acute  . CKD (chronic kidney disease) stage 3, GFR 30-59 ml/min (HCC) 06/29/2011  . COPD (chronic obstructive pulmonary disease) ([place]) 06/29/2011    Class: Chronic  . Prostate cancer ([place]) 06/29/2011    Class: Chronic  . CARDIOMYOPATHY, ISCHEMIC 05/08/2009  . DYSLIPIDEMIA 04/23/2009  . Essential hypertension 04/23/2009  . Coronary atherosclerosis 04/23/2009    Orientation RESPIRATION BLADDER Height & Weight        O2(4L) External catheter Weight: 173 lb 4.5 oz (78.6 kg) Height:  5\' 8"  (172.7 cm)  BEHAVIORAL SYMPTOMS/MOOD NEUROLOGICAL BOWEL NUTRITION STATUS        Diet(See DC summary)  AMBULATORY STATUS COMMUNICATION OF NEEDS Skin   Extensive Assist Verbally                         Personal Care Assistance Level of Assistance  Bathing, Feeding, Dressing Bathing Assistance: Maximum assistance Feeding assistance: Maximum assistance Dressing Assistance: Maximum assistance     Functional Limitations Info             SPECIAL CARE FACTORS FREQUENCY  PT (By licensed PT), OT (By licensed OT)     PT Frequency: 5 OT Frequency: 5            Contractures      Additional Factors Info  Code Status, Allergies Code Status Info: DNR Allergies Info: Demerol Zolpidem Tartrate  Current Medications (06/19/2019):  This is the current hospital active medication list Current Facility-Administered Medications  Medication Dose Route Frequency Provider Last Rate Last Dose  . acetaminophen (TYLENOL) tablet 650 mg  650 mg Oral Q6H PRN Reubin Milan, MD      . aspirin chewable tablet 81 mg  81 mg Oral Daily Reubin Milan, MD   81 mg at 06/19/19 0923  . atorvastatin (LIPITOR) tablet 80 mg  80 mg  Oral q1800 Reubin Milan, MD   80 mg at 06/18/19 1843  . budesonide (PULMICORT) 180 MCG/ACT inhaler 1 puff  1 puff Inhalation BID Collier Bullock, MD   1 puff at 06/19/19 0933  . Chlorhexidine Gluconate Cloth 2 % PADS 6 each  6 each Topical Daily Reubin Milan, MD   6 each at 06/19/19 1029  . chlorpheniramine-HYDROcodone (TUSSIONEX) 10-8 MG/5ML suspension 5 mL  5 mL Oral Q12H PRN Reubin Milan, MD   5 mL at 06/19/19 0815  . enoxaparin (LOVENOX) injection 40 mg  40 mg Subcutaneous Q24H Elgergawy, Silver Huguenin, MD   40 mg at 06/18/19 2007  . escitalopram (LEXAPRO) tablet 20 mg  20 mg Per Tube Daily Reubin Milan, MD   20 mg at 06/19/19 3007  . feeding supplement (ENSURE ENLIVE) (ENSURE ENLIVE) liquid 237 mL  237 mL Oral BID BM Allie Bossier, MD   237 mL at 06/19/19 0815  . feeding supplement (VITAL AF 1.2 CAL) liquid 1,000 mL  1,000 mL Per Tube Continuous Allie Bossier, MD      . guaiFENesin-dextromethorphan [facility] DM) 100-10 MG/5ML syrup 10 mL  10 mL Oral Q4H PRN Reubin Milan, MD   10 mL at 06/18/19 2124  . Ipratropium-Albuterol (COMBIVENT) respimat 1 puff  1 puff Inhalation Q6H Collier Bullock, MD   1 puff at 06/19/19 [phone]  . MEDLINE mouth rinse  15 mL Mouth Rinse BID Allie Bossier, MD   15 mL at 06/19/19 0816  . metoprolol tartrate (LOPRESSOR) tablet 12.5 mg  12.5 mg Oral BID Elgergawy, Silver Huguenin, MD   12.5 mg at 06/19/19 0814  . montelukast (SINGULAIR) tablet 10 mg  10 mg Oral QHS Collier Bullock, MD   10 mg at 06/18/19 2124  . multivitamin with minerals tablet 1 tablet  1 tablet Oral Daily Allie Bossier, MD   1 tablet at 06/19/19 0813  . pantoprazole (PROTONIX) EC tablet 40 mg  40 mg Oral Daily Allie Bossier, MD   40 mg at 06/19/19 0815  . sodium chloride flush (NS) 0.9 % injection 10-40 mL  10-40 mL Intracatheter Q12H Allie Bossier, MD   10 mL at 06/19/19 0816  . sodium chloride flush (NS) 0.9 % injection 10-40 mL  10-40 mL Intracatheter PRN Allie Bossier, MD      . tamsulosin [facility]) capsule 0.4 mg  0.4 mg Oral Daily Allie Bossier, MD   0.4 mg at 06/19/19 3335  . vitamin C (ASCORBIC ACID) tablet 500 mg  500 mg Oral Daily Reubin Milan, MD   500 mg at 06/19/19 4562  . zinc sulfate capsule 220 mg  220 mg Oral Daily Reubin Milan, MD   220 mg at 06/19/19 5638     Discharge Medications: Please see discharge summary for a list of discharge medications.  Relevant Imaging Results:  Relevant Lab Results:   Additional Information [id number]  Weston Anna, LCSW

## 2019-06-19 NOTE — Discharge Instructions (Signed)
Follow with Primary MD [employer] [employer], MD /SNF physician  Get CBC, CMP, checked  by Primary MD next visit.    Activity: As tolerated with Full fall precautions use walker/cane & assistance as needed   Disposition SNF   Diet: Dysphagia 2, with thin fluid  For Heart failure patients - Check your Weight same time everyday, if you gain over 2 pounds, or you develop in leg swelling, experience more shortness of breath or chest pain, call your Primary MD immediately. Follow Cardiac Low Salt Diet and 1.5 lit/day fluid restriction.   On your next visit with your primary care physician please Get Medicines reviewed and adjusted.   Please request your Prim.MD to go over all Hospital Tests and Procedure/Radiological results at the follow up, please get all Hospital records sent to your Prim MD by signing hospital release before you go home.   If you experience worsening of your admission symptoms, develop shortness of breath, life threatening emergency, suicidal or homicidal thoughts you must seek medical attention immediately by calling 911 or calling your MD immediately  if symptoms less severe.  You Must read complete instructions/literature along with all the possible adverse reactions/side effects for all the Medicines you take and that have been prescribed to you. Take any new Medicines after you have completely understood and accpet all the possible adverse reactions/side effects.   Do not drive, operating heavy machinery, perform activities at heights, swimming or participation in water activities or provide baby sitting services if your were admitted for syncope or siezures until you have seen by Primary MD or a Neurologist and advised to do so again.  Do not drive when taking Pain medications.    Do not take more than prescribed Pain, Sleep and Anxiety Medications  Special Instructions: If you have smoked or chewed Tobacco  in the last 2 yrs please stop smoking, stop any regular  Alcohol  and or any Recreational drug use.  Wear Seat belts while driving.   Please note  You were cared for by a hospitalist during your hospital stay. If you have any questions about your discharge medications or the care you received while you were in the hospital after you are discharged, you can call the unit and asked to speak with the hospitalist on call if the hospitalist that took care of you is not available. Once you are discharged, your primary care physician will handle any further medical issues. Please note that NO REFILLS for any discharge medications will be authorized once you are discharged, as it is imperative that you return to your primary care physician (or establish a relationship with a primary care physician if you do not have one) for your aftercare needs so that they can reassess your need for medications and monitor your lab values.

## 2019-06-19 NOTE — Discharge Summary (Signed)
[name], is a 84 y.o. male  DOB 13-Feb-1935  MRN [id number].  Admission date:  06/09/2019  Admitting Physician  Mason Milan, MD  Discharge Date:  06/19/2019   Primary MD  Mason School, MD  Recommendations for primary care physician for things to follow:  -Please check CBC, CMP in 3 to 5 days. -Patient on 2 to 4 L nasal cannula. -Please continue encouragement to use incentive spirometer very frequently, and please get out of bed to chair on a daily basis for few hours, to prevent atelectasis and to address poor inspiratory effort.  - Dysphagia 2 diet with thin liquid -CODE STATUS: DNR   Admission Diagnosis  COVID-19 VIRUS INFECTION RESPIRATORY FAILURE  UROSEPSIS    Discharge Diagnosis  COVID-19 VIRUS INFECTION RESPIRATORY FAILURE  UROSEPSIS     Principal Problem:   Acute respiratory distress syndrome (ARDS) due to COVID-19 virus [name]) Active Problems:   Essential hypertension   Coronary atherosclerosis   Chronic systolic congestive heart failure   CKD (chronic kidney disease) stage 3, GFR 30-59 ml/min (HCC)   COPD (chronic obstructive pulmonary disease) (HCC)   AKI (acute kidney injury) (HCC)   Urinary tract infection   Acute respiratory failure with hypoxia (HCC)   Pneumonia due to COVID-19 virus   Acute renal failure superimposed on stage 3b chronic kidney disease (HCC)   Enterococcus UTI   Hypernatremia      Past Medical History:  Diagnosis Date   Anemia    Angina    Anticoagulated on warfarin 07/17/2011   Arthritis    Asthma    Atherosclerosis of native arteries of the extremities with intermittent claudication 08/13/2011   Blood transfusion 3 weeks ago   CAD 04/23/2009   CHF (congestive heart failure) (HCC)    Chronic kidney disease    COPD (chronic obstructive pulmonary disease) (HCC)    GERD (gastroesophageal reflux disease)    Heart attack  [facility]) October 27,2012 and July 28, 2011   Hypertension    Myocardial infarction [facility])    Prostate cancer ([place])    S/P ICD (internal cardiac defibrillator) procedure, 6.17.13 02/10/2012   Ventricular thrombus following MI (myocardial infarction) ([place]) 11/12   on coumadin    Past Surgical History:  Procedure Laterality Date   CARDIAC CATHETERIZATION     COLONOSCOPY N/A 03/23/2019   Procedure: COLONOSCOPY;  Surgeon: Mason Dolin, MD;  [facility];  Service: Endoscopy;  Laterality: N/A;   CORONARY ANGIOGRAM N/A 07/28/2011   Procedure: CORONARY ANGIOGRAM;  Surgeon: Mason Man, MD;  Location: [facility] CATH LAB;  Service: Cardiovascular;  Laterality: N/A;   CORONARY ANGIOPLASTY     CORONARY STENT PLACEMENT  06/21/11   FLEXIBLE SIGMOIDOSCOPY N/A 03/22/2019   Procedure: FLEXIBLE SIGMOIDOSCOPY;  Surgeon: Danie Binder, MD;  Location: [facility];  Service: Endoscopy;  Laterality: N/A;   GROIN DEBRIDEMENT  07/18/2011   Procedure: [name] DEBRIDEMENT;  Surgeon: Mason Mould, MD;  Location: [facility];  Service: Vascular;  Laterality: Right;  exploration  of right groin,evacuation of right groin hematoma & repair of right superficial femoral artery   IMPLANTABLE CARDIOVERTER DEFIBRILLATOR IMPLANT N/A 02/09/2012   Procedure: IMPLANTABLE CARDIOVERTER DEFIBRILLATOR IMPLANT;  Surgeon: Mason Klein, MD;  Location: [facility] CATH LAB;  Service: Cardiovascular;  Laterality: N/A;   INSERT / REPLACE / REMOVE PACEMAKER  02/09/2012   ICD   LEAD REVISION N/A 02/10/2012   Procedure: LEAD REVISION;  Surgeon: Mason Klein, MD;  Location: [facility] CATH LAB;  Service: Cardiovascular;  Laterality: N/A;   PERCUTANEOUS CORONARY STENT INTERVENTION (PCI-S) N/A 07/28/2011   Procedure: PERCUTANEOUS CORONARY STENT INTERVENTION (PCI-S);  Surgeon: Mason Man, MD;  Location: [facility] CATH LAB;  Service: Cardiovascular;  Laterality: N/A;   POLYPECTOMY  03/23/2019   Procedure: POLYPECTOMY;  Surgeon: Mason Dolin, MD;  [facility];  Service: Endoscopy;;  cecum, transverse, descending       History of present illness and  Hospital Course:     Kindly see H&P for history of present illness and admission details, please review complete Labs, Consult reports and Test reports for all details in brief  HPI  from the history and physical done on the day of admission 06/09/2019  HPI: [name] is a 84 y.o. male with medical history significant of anemia,  asthma/COPD,CAD, history MI, history of post MI ventricular thrombus,  chronic systolic heart failure, stage III chronic kidney disease, GERD, hypertension, prostate cancer, history of ICD placement who is coming to this facility from the emergency department from [facility] after presenting there due to dyspnea and AMS.  The patient has being intubated.  He was in that facility admitted from September 22 through October 3.  He was diagnosed on 10/05 with COVID-19.  Per sending facility physician, there is an outbreak of SARS 2 his nursing facility [facility]).  No further history is available.  ED Course: At the time of transfer presentation, vital signs at the facility where BP 96/72, heart rate 73, RR 14 and O2 sat 99%.  The patient was still on Levophed and Versed infusion.  His most recent ABG PH 7.35, PCO2 58, PO2 329, bicarbonate 32, TCO2 33.8, BE 5.9, O2 sat 99.5. His CBC showed a white count was 10, hemoglobin 11.9 g/dL and platelets 229.  BUN and creatinine were increasing from yesterday 39/1.5 to 2-day 46/1 0.9 mg/dL.  LFTs were reported normal.  PT was 15.4 seconds and INR 1.5.  Ammonia level was 45 mol/L.He is proBNP was 1411 and CRP 14.55 mg/dL.  He is urinalysis showed leukocyturia and he has a recent urine culture showing pansensitive Enterococcus.  Imaging is significant for chronic COPD changes and LLL infiltrate.  Please see records from [facility] for further detail.   Hospital Course    84 y.o.WM PMHx  anemia, asthma/COPD liters O2 at [facility], history MI, history of post MI ventricular thrombus (, anticoagulated on warfarin)chronic systolic heart failure, HTN, s/p ICD placement, CKD stage III, GERD,  prostate cancer,admitted from [facility] on 10/15 after being intubated for mechanical ventilation in setting of ARDS from COVID 19.At baseline lives in a nursing home, has multiple comorbid illnesses.   10/14 intubated [facility] 10/15 admitted [place] 10/16 extubated 10/18: Transfered to medical floor, improvement of oxygen requirement, back at baseline 2 to 4 L nasal cannula  Acute on chronic respiratory failure with hypoxia/Covid pneumonia -Patient presents intubated from [facility], successfully extubated 10/16. -Patient currently on 3-4 L nasal cannula, he is back to baseline, patient had significant improvement after  persistent reminder to use incentive spirometry, and getting out of bed to chair daily ,, please continue such measures at old nursing facility . -Treated with IV steroids, IV remdesivir -10/20 passed swallow evaluation dysphagia 2 thin liquid  COPD -Currently with no wheezing, continue with Combivent  Acute on CKD stage III (baseline Cr ~1.7) -Resolved  Enterococcus UTI (pansensitive) -Completed 7-day course antibiotics  Chronic systolic CHF -15/17/6160 EF 35 to 40% with diffuse hypokinesis -06/12/2019 echocardiogram shows worsening cardiac function EF 20 to 25% see results below, will start on low-dose beta-blocker, patient is status post AICD placement in the past. -Strict in and out , so far remains positive balance during hospital stay, but weight has been overall trending down over last few days . -Daily weight with as needed Lasix -Patient had 12 beats of nonsustained V. tach, already on beta-blockers, potassium of 3.5 which has been repleted, as well he was given 1 g of mag sulfate, he already has AICD.      Filed Weights   06/17/19 0500 06/18/19 0412  06/18/19 0817  Weight: 78.4 kg 76.6 kg 78.6 kg    Essential HTN -Was on Levophed telemetry -Currently controlled without BP medication, but he was started on low-dose beta-blocker given EF of 25%.  CAD -Aspirin 81 mg daily, and statin, low-dose beta-blockers  Hypernatremia -Resolved,    Hypokalemia -Potassium goal> 4  Hypophosphatemia -Repleted      Discharge Condition:  -Stable at time of discharge, but overall extremely frail     Discharge Instructions  and  Discharge Medications     Discharge Instructions    Discharge instructions   Complete by: As directed    Follow with Primary MD Mason School, MD /SNF physician  Get CBC, CMP, checked  by Primary MD next visit.    Activity: As tolerated with Full fall precautions use walker/cane & assistance as needed   Disposition SNF   Diet: Dysphagia 2, with thin fluid  For Heart failure patients - Check your Weight same time everyday, if you gain over 2 pounds, or you develop in leg swelling, experience more shortness of breath or chest pain, call your Primary MD immediately. Follow Cardiac Low Salt Diet and 1.5 lit/day fluid restriction.   On your next visit with your primary care physician please Get Medicines reviewed and adjusted.   Please request your Prim.MD to go over all Hospital Tests and Procedure/Radiological results at the follow up, please get all Hospital records sent to your Prim MD by signing hospital release before you go home.   If you experience worsening of your admission symptoms, develop shortness of breath, life threatening emergency, suicidal or homicidal thoughts you must seek medical attention immediately by calling 911 or calling your MD immediately  if symptoms less severe.  You Must read complete instructions/literature along with all the possible adverse reactions/side effects for all the Medicines you take and that have been prescribed to you. Take any new Medicines  after you have completely understood and accpet all the possible adverse reactions/side effects.   Do not drive, operating heavy machinery, perform activities at heights, swimming or participation in water activities or provide baby sitting services if your were admitted for syncope or siezures until you have seen by Primary MD or a Neurologist and advised to do so again.  Do not drive when taking Pain medications.    Do not take more than prescribed Pain, Sleep and Anxiety Medications  Special Instructions: If you have smoked or chewed Tobacco  in the last 2 yrs please stop smoking, stop any regular Alcohol  and or any Recreational drug use.  Wear Seat belts while driving.   Please note  You were cared for by a hospitalist during your hospital stay. If you have any questions about your discharge medications or the care you received while you were in the hospital after you are discharged, you can call the unit and asked to speak with the hospitalist on call if the hospitalist that took care of you is not available. Once you are discharged, your primary care physician will handle any further medical issues. Please note that NO REFILLS for any discharge medications will be authorized once you are discharged, as it is imperative that you return to your primary care physician (or establish a relationship with a primary care physician if you do not have one) for your aftercare needs so that they can reassess your need for medications and monitor your lab values.   Increase activity slowly   Complete by: As directed      Allergies as of 06/19/2019      Reactions   Demerol Other (See Comments)   Abnormal behavior   Zolpidem Tartrate Other (See Comments)   Pt. Became confused and aggitated      Medication List    STOP taking these medications   bisoprolol 5 MG tablet Commonly known as: ZEBETA   ciprofloxacin-dexamethasone OTIC suspension Commonly known as: CIPRODEX   diltiazem 120 MG 12  hr capsule Commonly known as: CARDIZEM SR   gabapentin 600 MG tablet Commonly known as: NEURONTIN   HYDROcodone-acetaminophen 10-325 MG tablet Commonly known as: NORCO   ipratropium-albuterol 0.5-2.5 (3) MG/3ML Soln Commonly known as: DUONEB Replaced by: Ipratropium-Albuterol 20-100 MCG/ACT Aers respimat   predniSONE 10 MG tablet Commonly known as: DELTASONE     TAKE these medications   acetaminophen 325 MG tablet Commonly known as: TYLENOL Take 2 tablets (650 mg total) by mouth every 6 (six) hours as needed for mild pain or headache (fever >/= 101).   aspirin 81 MG tablet Take 81 mg by mouth daily.   budesonide 180 MCG/ACT inhaler Commonly known as: PULMICORT Inhale 1 puff into the lungs 2 (two) times daily.   carisoprodol 350 MG tablet Commonly known as: SOMA Take 350 mg by mouth 4 (four) times daily as needed for muscle spasms.   cetirizine 10 MG tablet Commonly known as: ZYRTEC Take 10 mg by mouth daily.   chlorpheniramine 4 MG tablet Commonly known as: CHLOR-TRIMETON Take 4 mg by mouth 2 (two) times daily.   escitalopram 20 MG tablet Commonly known as: LEXAPRO Take 20 mg by mouth daily.   feeding supplement (ENSURE ENLIVE) Liqd Take 237 mLs by mouth 2 (two) times daily between meals.   ferrous sulfate 325 (65 FE) MG tablet Take 325 mg by mouth daily.   fluticasone 50 MCG/ACT nasal spray Commonly known as: FLONASE Place 2 sprays into both nostrils every morning. *Occasional night use   furosemide 40 MG tablet Commonly known as: LASIX Take 40 mg by mouth daily as needed for fluid.   guaiFENesin 600 MG 12 hr tablet Commonly known as: MUCINEX Take 600 mg by mouth 2 (two) times daily.   ipratropium 0.03 % nasal spray Commonly known as: ATROVENT Place 1 spray into both nostrils every 12 (twelve) hours.   Ipratropium-Albuterol 20-100 MCG/ACT Aers respimat Commonly known as: COMBIVENT Inhale 1 puff into the lungs every 6 (six) hours. Replaces:  ipratropium-albuterol 0.5-2.5 (3) MG/3ML  Soln   levalbuterol 0.63 MG/3ML nebulizer solution Commonly known as: XOPENEX Take 1 ampule by nebulization 2 (two) times daily. For shortness of breath   levalbuterol 45 MCG/ACT inhaler Commonly known as: XOPENEX HFA Inhale 2 puffs into the lungs every 4 (four) hours as needed for wheezing.   metoprolol tartrate 25 MG tablet Commonly known as: LOPRESSOR Take 0.5 tablets (12.5 mg total) by mouth 2 (two) times daily.   montelukast 10 MG tablet Commonly known as: SINGULAIR Take 10 mg by mouth daily.   Multi-Vitamins Tabs Take 0.5 tablets by mouth 2 (two) times daily.   nitroGLYCERIN 0.4 MG SL tablet Commonly known as: NITROSTAT Place 0.4 mg under the tongue every 5 (five) minutes as needed. For chest pain   omeprazole 20 MG capsule Commonly known as: PRILOSEC Take 20 mg by mouth 2 (two) times daily.   potassium chloride 10 MEQ CR capsule Commonly known as: MICRO-K Take 10 mEq by mouth daily.   prasugrel 10 MG Tabs tablet Commonly known as: EFFIENT Take 10 mg by mouth daily.   rosuvastatin 40 MG tablet Commonly known as: CRESTOR Take 40 mg by mouth daily.   sodium chloride 0.65 % Soln nasal spray Commonly known as: OCEAN Place 1 spray into both nostrils as needed for congestion.   terazosin 2 MG capsule Commonly known as: HYTRIN Take 2 mg by mouth at bedtime.   triamcinolone cream 0.5 % Commonly known as: KENALOG Apply 1 application topically 2 (two) times daily as needed (for skin irritation).         Diet and Activity recommendation: See Discharge Instructions above   Consults obtained - PCCM   Major procedures and Radiology Reports - PLEASE review detailed and final reports for all details, in brief -      Dg Abd 1 View  Result Date: 06/09/2019 CLINICAL DATA:  Orogastric tube placement. EXAM: ABDOMEN - 1 VIEW COMPARISON:  November 03, 2016. FINDINGS: The bowel gas pattern is normal. Distal tip of enteric  tube is seen at gastroesophageal junction. No radio-opaque calculi or other significant radiographic abnormality are seen. IMPRESSION: Distal tip of enteric tube seen at gastroesophageal junction. Advancement is recommended. No abnormal bowel gas pattern is noted. Electronically Signed   By: Marijo Conception M.D.   On: 06/09/2019 12:12   Dg Chest Port 1 View  Result Date: 06/12/2019 CLINICAL DATA:  Hypoxemia EXAM: PORTABLE CHEST 1 VIEW COMPARISON:  06/10/2019 FINDINGS: Endotracheal tube removed. Pacemaker/AICD remains in place. Coronary artery stents again visible. Heart size is normal. No evidence of heart failure or effusion. Slight worsening of atelectasis at the lung bases following extubation. IMPRESSION: Slight worsening of atelectasis at the lung bases following extubation. Electronically Signed   By: Nelson Chimes M.D.   On: 06/12/2019 07:12   Portable Chest Xray  Result Date: 06/10/2019 CLINICAL DATA:  84 year old male with history of acute respiratory distress secondary to COVID-19 infection. EXAM: PORTABLE CHEST 1 VIEW COMPARISON:  Chest x-ray 06/09/2019. FINDINGS: An endotracheal tube is in place with tip 5.6 cm above the carina. Lung volumes are normal. Ill-defined opacities at the left lung base likely reflect resolving areas of airspace consolidation. There is are patchy areas of interstitial prominence in the right lung, most evident at the right lung base. Overall, aeration appears similar to the prior study. No pleural effusions. No evidence of pulmonary edema. Heart size is borderline enlarged. Upper mediastinal contours are within normal limits. Aortic atherosclerosis. Left-sided pacemaker/AICD with lead tips projecting over the  expected location of the right atrium and right ventricle. IMPRESSION: 1. Support apparatus, as above. 2. Patchy ill-defined opacities in the lung bases bilaterally (left greater than right), similar to the prior study, likely reflective of residual multilobar  pneumonia. Aeration appears unchanged. 3. Aortic atherosclerosis. Electronically Signed   By: Vinnie Langton M.D.   On: 06/10/2019 08:21   Dg Chest Port 1 View  Result Date: 06/09/2019 CLINICAL DATA:  Post intubation. COVID-19 positive patient. EXAM: PORTABLE CHEST 1 VIEW COMPARISON:  June 08, 2019 FINDINGS: Endotracheal tube in satisfactory radiographic position. Enteric catheter with side hole overlying the expected location of the gastric cardia. Dual lead cardiac pacemaker in stable position. Cardiomediastinal silhouette is normal. Mediastinal contours appear intact. Subtle patchy bilateral ground-glass opacities with lower lobe predominance. Osseous structures are without acute abnormality. Soft tissues are grossly normal. IMPRESSION: 1. Subtle patchy bilateral ground-glass opacities with lower lobe predominance. 2. Endotracheal tube in satisfactory position. 3. Enteric catheter with side hole overlying the expected location of the gastric cardia. Electronically Signed   By: Fidela Salisbury M.D.   On: 06/09/2019 10:04   Dg Abd Portable 1v  Result Date: 06/09/2019 CLINICAL DATA:  OG tube. EXAM: PORTABLE ABDOMEN - 1 VIEW COMPARISON:  Prior study same day. FINDINGS: OG tube tip in the upper stomach. Side hole at the gastroesophageal junction. Subsequent repositioning was performed. No bowel distention. Cardiac pacer noted IMPRESSION: OG tube with position as above. Electronically Signed   By: Marcello Moores  Register   On: 06/09/2019 12:43   Dg Abd Portable 1v  Result Date: 06/09/2019 CLINICAL DATA:  OG tube positioning. EXAM: PORTABLE ABDOMEN - 1 VIEW COMPARISON:  Prior study same day. FINDINGS: OG tube tip noted in upper stomach. Side hole at the gastroesophageal junction. Subsequent repositioning was performed as demonstrated by later image. No bowel distention. Cardiac pacer noted. IMPRESSION: OG tube position as above. Electronically Signed   By: Marcello Moores  Register   On: 06/09/2019 12:43   Dg  Abd Portable 1v  Result Date: 06/09/2019 CLINICAL DATA:  OG tube repositioning. EXAM: PORTABLE ABDOMEN - 1 VIEW COMPARISON:  Prior study same day. FINDINGS: Interval advancement of OG tube with side hole and tip over the upper stomach. No bowel distention. Cardiac pacer noted. IMPRESSION: Number vascular OG tube with side hole and tip over the upper stomach. Electronically Signed   By: Marcello Moores  Register   On: 06/09/2019 12:41    Micro Results     Recent Results (from the past 240 hour(s))  MRSA PCR Screening     Status: Abnormal   Collection Time: 06/10/19 12:59 PM   Specimen: Nasal Mucosa; Nasopharyngeal  Result Value Ref Range Status   MRSA by PCR POSITIVE (A) NEGATIVE Final    Comment:        The GeneXpert MRSA Assay (FDA approved for NASAL specimens only), is one component of a comprehensive MRSA colonization surveillance program. It is not intended to diagnose MRSA infection nor to guide or monitor treatment for MRSA infections. RESULT CALLED TO, READ BACK BY AND VERIFIED WITH: B.RUDD,RN [id number] @0138  BY V.WILKINS Performed at [facility] [address], [place], [place] [address]        Today   Subjective:   [name] today has no headache,no chest  Or abdominal pain, he just report generalized weakness, no chest pain or dyspnea. Objective:   Blood pressure 109/74, pulse 74, temperature 97.7 F (36.5 C), temperature source Oral, resp. rate 18, height 5\' 8"  (1.727 m),  weight 78.6 kg, SpO2 (!) 84 %.   Intake/Output Summary (Last 24 hours) at 06/19/2019 1031 Last data filed at 06/19/2019 0430 Gross per 24 hour  Intake 200 ml  Output 550 ml  Net -350 ml    Exam Awake Alert, pleasant, conversant, extremely frail Symmetrical Chest wall movement, improved air entry at the bases, no wheezing RRR,No Gallops,Rubs or new Murmurs, No Parasternal Heave +ve B.Sounds, Abd Soft, Non tender,, No rebound -guarding or rigidity. No Cyanosis,  Clubbing or edema, No new Rash or bruise  Data Review   CBC w Diff:  Lab Results  Component Value Date   WBC 9.7 06/17/2019   HGB 9.5 (L) 06/17/2019   HCT 31.1 (L) 06/17/2019   PLT 187 06/17/2019   LYMPHOPCT 7 06/14/2019   MONOPCT 7 06/14/2019   EOSPCT 1 06/14/2019   BASOPCT 0 06/14/2019    CMP:  Lab Results  Component Value Date   NA 142 06/18/2019   K 3.5 06/18/2019   CL 105 06/18/2019   CO2 29 06/18/2019   BUN 24 (H) 06/18/2019   CREATININE 1.15 06/18/2019   CREATININE 1.39 (H) 05/29/2015   PROT 5.3 (L) 06/18/2019   ALBUMIN 2.5 (L) 06/18/2019   BILITOT 0.8 06/18/2019   ALKPHOS 62 06/18/2019   AST 34 06/18/2019   ALT 31 06/18/2019  .   Total Time in preparing paper work, data evaluation and todays exam - 38 minutes  Phillips Climes M.D on 06/19/2019 at 10:31 AM  [facility]   Office  [phone]

## 2019-06-30 DIAGNOSIS — I5023 Acute on chronic systolic (congestive) heart failure: Secondary | ICD-10-CM | POA: Diagnosis not present

## 2019-06-30 DIAGNOSIS — J9612 Chronic respiratory failure with hypercapnia: Secondary | ICD-10-CM | POA: Diagnosis not present

## 2019-06-30 DIAGNOSIS — J449 Chronic obstructive pulmonary disease, unspecified: Secondary | ICD-10-CM | POA: Diagnosis not present

## 2019-07-04 DIAGNOSIS — I5023 Acute on chronic systolic (congestive) heart failure: Secondary | ICD-10-CM | POA: Diagnosis not present

## 2019-07-04 DIAGNOSIS — R100 Acute abdomen: Secondary | ICD-10-CM | POA: Diagnosis not present

## 2019-07-07 DIAGNOSIS — J449 Chronic obstructive pulmonary disease, unspecified: Secondary | ICD-10-CM | POA: Diagnosis not present

## 2019-07-07 DIAGNOSIS — I5023 Acute on chronic systolic (congestive) heart failure: Secondary | ICD-10-CM | POA: Diagnosis not present

## 2019-07-07 DIAGNOSIS — J9612 Chronic respiratory failure with hypercapnia: Secondary | ICD-10-CM | POA: Diagnosis not present

## 2019-07-14 DIAGNOSIS — I5023 Acute on chronic systolic (congestive) heart failure: Secondary | ICD-10-CM | POA: Diagnosis not present

## 2019-07-14 DIAGNOSIS — J9612 Chronic respiratory failure with hypercapnia: Secondary | ICD-10-CM | POA: Diagnosis not present

## 2019-07-15 DIAGNOSIS — R404 Transient alteration of awareness: Secondary | ICD-10-CM | POA: Diagnosis not present

## 2019-07-15 DIAGNOSIS — R410 Disorientation, unspecified: Secondary | ICD-10-CM | POA: Diagnosis not present

## 2019-07-15 DIAGNOSIS — Z743 Need for continuous supervision: Secondary | ICD-10-CM | POA: Diagnosis not present

## 2019-07-15 DIAGNOSIS — Z7401 Bed confinement status: Secondary | ICD-10-CM | POA: Diagnosis not present

## 2019-07-26 DEATH — deceased
